# Patient Record
Sex: Female | Born: 1945
Health system: Southern US, Community
[De-identification: ages and names within clinical notes are randomized; demographics above are authoritative.]

## PROBLEM LIST (undated history)

## (undated) DIAGNOSIS — I10 Essential (primary) hypertension: Secondary | ICD-10-CM

## (undated) DIAGNOSIS — M5136 Other intervertebral disc degeneration, lumbar region: Secondary | ICD-10-CM

## (undated) DIAGNOSIS — J45909 Unspecified asthma, uncomplicated: Secondary | ICD-10-CM

## (undated) DIAGNOSIS — T8859XA Other complications of anesthesia, initial encounter: Secondary | ICD-10-CM

## (undated) DIAGNOSIS — M51369 Other intervertebral disc degeneration, lumbar region without mention of lumbar back pain or lower extremity pain: Secondary | ICD-10-CM

## (undated) DIAGNOSIS — K759 Inflammatory liver disease, unspecified: Secondary | ICD-10-CM

## (undated) DIAGNOSIS — E079 Disorder of thyroid, unspecified: Secondary | ICD-10-CM

## (undated) DIAGNOSIS — T4145XA Adverse effect of unspecified anesthetic, initial encounter: Secondary | ICD-10-CM

## (undated) DIAGNOSIS — R011 Cardiac murmur, unspecified: Secondary | ICD-10-CM

## (undated) DIAGNOSIS — M5135 Other intervertebral disc degeneration, thoracolumbar region: Secondary | ICD-10-CM

## (undated) DIAGNOSIS — K219 Gastro-esophageal reflux disease without esophagitis: Secondary | ICD-10-CM

## (undated) DIAGNOSIS — K5790 Diverticulosis of intestine, part unspecified, without perforation or abscess without bleeding: Secondary | ICD-10-CM

## (undated) DIAGNOSIS — Z9889 Other specified postprocedural states: Secondary | ICD-10-CM

## (undated) DIAGNOSIS — E039 Hypothyroidism, unspecified: Secondary | ICD-10-CM

## (undated) DIAGNOSIS — M5126 Other intervertebral disc displacement, lumbar region: Secondary | ICD-10-CM

## (undated) DIAGNOSIS — Z8489 Family history of other specified conditions: Secondary | ICD-10-CM

## (undated) DIAGNOSIS — N3010 Interstitial cystitis (chronic) without hematuria: Secondary | ICD-10-CM

## (undated) DIAGNOSIS — M797 Fibromyalgia: Secondary | ICD-10-CM

## (undated) DIAGNOSIS — M1990 Unspecified osteoarthritis, unspecified site: Secondary | ICD-10-CM

## (undated) DIAGNOSIS — C801 Malignant (primary) neoplasm, unspecified: Secondary | ICD-10-CM

## (undated) DIAGNOSIS — N289 Disorder of kidney and ureter, unspecified: Secondary | ICD-10-CM

## (undated) DIAGNOSIS — R112 Nausea with vomiting, unspecified: Secondary | ICD-10-CM

## (undated) HISTORY — DX: Other intervertebral disc displacement, lumbar region: M51.26

## (undated) HISTORY — DX: Cardiac murmur, unspecified: R01.1

## (undated) HISTORY — PX: COLON SURGERY: SHX602

## (undated) HISTORY — DX: Unspecified osteoarthritis, unspecified site: M19.90

## (undated) HISTORY — DX: Unspecified asthma, uncomplicated: J45.909

## (undated) HISTORY — DX: Malignant (primary) neoplasm, unspecified: C80.1

## (undated) HISTORY — PX: COLONOSCOPY: SHX174

## (undated) HISTORY — DX: Other intervertebral disc degeneration, lumbar region: M51.36

## (undated) HISTORY — DX: Disorder of thyroid, unspecified: E07.9

## (undated) HISTORY — PX: BREAST SURGERY: SHX581

## (undated) HISTORY — DX: Disorder of kidney and ureter, unspecified: N28.9

## (undated) HISTORY — DX: Diverticulosis of intestine, part unspecified, without perforation or abscess without bleeding: K57.90

## (undated) HISTORY — DX: Other intervertebral disc degeneration, lumbar region without mention of lumbar back pain or lower extremity pain: M51.369

## (undated) HISTORY — PX: TONSILLECTOMY: SUR1361

## (undated) HISTORY — DX: Interstitial cystitis (chronic) without hematuria: N30.10

---
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
## 1973-07-25 HISTORY — PX: HEMORRHOID SURGERY: SHX153

## 1973-07-25 HISTORY — PX: EXCISIONAL HEMORRHOIDECTOMY: SHX1541

## 1973-07-25 HISTORY — PX: DILATION AND CURETTAGE OF UTERUS: SHX78

## 1982-07-25 HISTORY — PX: BLADDER SURGERY: SHX569

## 1997-07-25 DIAGNOSIS — C801 Malignant (primary) neoplasm, unspecified: Secondary | ICD-10-CM

## 1997-07-25 HISTORY — PX: THYROIDECTOMY: SHX17

## 1997-07-25 HISTORY — DX: Malignant (primary) neoplasm, unspecified: C80.1

## 2004-06-28 ENCOUNTER — Ambulatory Visit: Payer: Self-pay | Admitting: General Surgery

## 2008-10-27 ENCOUNTER — Ambulatory Visit: Payer: Self-pay | Admitting: Internal Medicine

## 2009-07-25 HISTORY — PX: EYE SURGERY: SHX253

## 2009-07-25 HISTORY — PX: CATARACT EXTRACTION: SUR2

## 2010-08-03 ENCOUNTER — Ambulatory Visit: Payer: Self-pay | Admitting: Ophthalmology

## 2010-08-10 ENCOUNTER — Ambulatory Visit: Payer: Self-pay | Admitting: Ophthalmology

## 2010-11-29 ENCOUNTER — Other Ambulatory Visit: Payer: Self-pay | Admitting: Internal Medicine

## 2011-07-28 DIAGNOSIS — J309 Allergic rhinitis, unspecified: Secondary | ICD-10-CM | POA: Diagnosis not present

## 2011-08-02 DIAGNOSIS — K219 Gastro-esophageal reflux disease without esophagitis: Secondary | ICD-10-CM | POA: Diagnosis not present

## 2011-08-02 DIAGNOSIS — E038 Other specified hypothyroidism: Secondary | ICD-10-CM | POA: Diagnosis not present

## 2011-08-02 DIAGNOSIS — J309 Allergic rhinitis, unspecified: Secondary | ICD-10-CM | POA: Diagnosis not present

## 2011-08-02 DIAGNOSIS — J449 Chronic obstructive pulmonary disease, unspecified: Secondary | ICD-10-CM | POA: Diagnosis not present

## 2011-08-02 DIAGNOSIS — M353 Polymyalgia rheumatica: Secondary | ICD-10-CM | POA: Diagnosis not present

## 2011-08-11 DIAGNOSIS — J309 Allergic rhinitis, unspecified: Secondary | ICD-10-CM | POA: Diagnosis not present

## 2011-08-16 DIAGNOSIS — J309 Allergic rhinitis, unspecified: Secondary | ICD-10-CM | POA: Diagnosis not present

## 2011-08-17 DIAGNOSIS — M2550 Pain in unspecified joint: Secondary | ICD-10-CM | POA: Diagnosis not present

## 2011-08-17 DIAGNOSIS — IMO0001 Reserved for inherently not codable concepts without codable children: Secondary | ICD-10-CM | POA: Diagnosis not present

## 2011-08-17 DIAGNOSIS — M65839 Other synovitis and tenosynovitis, unspecified forearm: Secondary | ICD-10-CM | POA: Diagnosis not present

## 2011-08-17 DIAGNOSIS — M65849 Other synovitis and tenosynovitis, unspecified hand: Secondary | ICD-10-CM | POA: Diagnosis not present

## 2011-08-17 DIAGNOSIS — M1990 Unspecified osteoarthritis, unspecified site: Secondary | ICD-10-CM | POA: Diagnosis not present

## 2011-08-23 DIAGNOSIS — J309 Allergic rhinitis, unspecified: Secondary | ICD-10-CM | POA: Diagnosis not present

## 2011-08-26 DIAGNOSIS — J309 Allergic rhinitis, unspecified: Secondary | ICD-10-CM | POA: Diagnosis not present

## 2011-08-30 DIAGNOSIS — J309 Allergic rhinitis, unspecified: Secondary | ICD-10-CM | POA: Diagnosis not present

## 2011-09-06 DIAGNOSIS — J309 Allergic rhinitis, unspecified: Secondary | ICD-10-CM | POA: Diagnosis not present

## 2011-09-07 DIAGNOSIS — M2550 Pain in unspecified joint: Secondary | ICD-10-CM | POA: Diagnosis not present

## 2011-09-07 DIAGNOSIS — IMO0002 Reserved for concepts with insufficient information to code with codable children: Secondary | ICD-10-CM | POA: Diagnosis not present

## 2011-09-07 DIAGNOSIS — IMO0001 Reserved for inherently not codable concepts without codable children: Secondary | ICD-10-CM | POA: Diagnosis not present

## 2011-09-07 DIAGNOSIS — M810 Age-related osteoporosis without current pathological fracture: Secondary | ICD-10-CM | POA: Diagnosis not present

## 2011-09-13 DIAGNOSIS — J309 Allergic rhinitis, unspecified: Secondary | ICD-10-CM | POA: Diagnosis not present

## 2011-09-23 DIAGNOSIS — J398 Other specified diseases of upper respiratory tract: Secondary | ICD-10-CM | POA: Diagnosis not present

## 2011-09-29 DIAGNOSIS — J309 Allergic rhinitis, unspecified: Secondary | ICD-10-CM | POA: Diagnosis not present

## 2011-10-11 DIAGNOSIS — J309 Allergic rhinitis, unspecified: Secondary | ICD-10-CM | POA: Diagnosis not present

## 2011-10-11 DIAGNOSIS — T887XXA Unspecified adverse effect of drug or medicament, initial encounter: Secondary | ICD-10-CM | POA: Diagnosis not present

## 2011-10-18 DIAGNOSIS — J309 Allergic rhinitis, unspecified: Secondary | ICD-10-CM | POA: Diagnosis not present

## 2011-10-25 DIAGNOSIS — J309 Allergic rhinitis, unspecified: Secondary | ICD-10-CM | POA: Diagnosis not present

## 2011-11-03 DIAGNOSIS — J309 Allergic rhinitis, unspecified: Secondary | ICD-10-CM | POA: Diagnosis not present

## 2011-11-04 DIAGNOSIS — E039 Hypothyroidism, unspecified: Secondary | ICD-10-CM | POA: Diagnosis not present

## 2011-11-04 DIAGNOSIS — E7800 Pure hypercholesterolemia, unspecified: Secondary | ICD-10-CM | POA: Diagnosis not present

## 2011-11-08 DIAGNOSIS — J309 Allergic rhinitis, unspecified: Secondary | ICD-10-CM | POA: Diagnosis not present

## 2011-11-22 DIAGNOSIS — J309 Allergic rhinitis, unspecified: Secondary | ICD-10-CM | POA: Diagnosis not present

## 2011-11-29 DIAGNOSIS — M353 Polymyalgia rheumatica: Secondary | ICD-10-CM | POA: Diagnosis not present

## 2011-11-29 DIAGNOSIS — J311 Chronic nasopharyngitis: Secondary | ICD-10-CM | POA: Diagnosis not present

## 2011-11-29 DIAGNOSIS — J309 Allergic rhinitis, unspecified: Secondary | ICD-10-CM | POA: Diagnosis not present

## 2011-11-29 DIAGNOSIS — J449 Chronic obstructive pulmonary disease, unspecified: Secondary | ICD-10-CM | POA: Diagnosis not present

## 2011-11-29 DIAGNOSIS — K219 Gastro-esophageal reflux disease without esophagitis: Secondary | ICD-10-CM | POA: Diagnosis not present

## 2011-11-29 DIAGNOSIS — E038 Other specified hypothyroidism: Secondary | ICD-10-CM | POA: Diagnosis not present

## 2011-12-08 DIAGNOSIS — J309 Allergic rhinitis, unspecified: Secondary | ICD-10-CM | POA: Diagnosis not present

## 2011-12-21 DIAGNOSIS — M1990 Unspecified osteoarthritis, unspecified site: Secondary | ICD-10-CM | POA: Diagnosis not present

## 2011-12-21 DIAGNOSIS — IMO0001 Reserved for inherently not codable concepts without codable children: Secondary | ICD-10-CM | POA: Diagnosis not present

## 2011-12-21 DIAGNOSIS — IMO0002 Reserved for concepts with insufficient information to code with codable children: Secondary | ICD-10-CM | POA: Diagnosis not present

## 2011-12-21 DIAGNOSIS — M2550 Pain in unspecified joint: Secondary | ICD-10-CM | POA: Diagnosis not present

## 2011-12-22 DIAGNOSIS — J309 Allergic rhinitis, unspecified: Secondary | ICD-10-CM | POA: Diagnosis not present

## 2011-12-27 DIAGNOSIS — J309 Allergic rhinitis, unspecified: Secondary | ICD-10-CM | POA: Diagnosis not present

## 2012-01-03 DIAGNOSIS — J309 Allergic rhinitis, unspecified: Secondary | ICD-10-CM | POA: Diagnosis not present

## 2012-01-03 DIAGNOSIS — J45909 Unspecified asthma, uncomplicated: Secondary | ICD-10-CM | POA: Diagnosis not present

## 2012-01-03 DIAGNOSIS — J3089 Other allergic rhinitis: Secondary | ICD-10-CM | POA: Diagnosis not present

## 2012-01-03 DIAGNOSIS — J301 Allergic rhinitis due to pollen: Secondary | ICD-10-CM | POA: Diagnosis not present

## 2012-01-12 DIAGNOSIS — J309 Allergic rhinitis, unspecified: Secondary | ICD-10-CM | POA: Diagnosis not present

## 2012-01-17 DIAGNOSIS — J309 Allergic rhinitis, unspecified: Secondary | ICD-10-CM | POA: Diagnosis not present

## 2012-01-19 DIAGNOSIS — M2550 Pain in unspecified joint: Secondary | ICD-10-CM | POA: Diagnosis not present

## 2012-01-19 DIAGNOSIS — IMO0002 Reserved for concepts with insufficient information to code with codable children: Secondary | ICD-10-CM | POA: Diagnosis not present

## 2012-01-19 DIAGNOSIS — M1990 Unspecified osteoarthritis, unspecified site: Secondary | ICD-10-CM | POA: Diagnosis not present

## 2012-01-19 DIAGNOSIS — IMO0001 Reserved for inherently not codable concepts without codable children: Secondary | ICD-10-CM | POA: Diagnosis not present

## 2012-01-31 DIAGNOSIS — J309 Allergic rhinitis, unspecified: Secondary | ICD-10-CM | POA: Diagnosis not present

## 2012-02-07 DIAGNOSIS — J309 Allergic rhinitis, unspecified: Secondary | ICD-10-CM | POA: Diagnosis not present

## 2012-02-14 DIAGNOSIS — J309 Allergic rhinitis, unspecified: Secondary | ICD-10-CM | POA: Diagnosis not present

## 2012-02-15 DIAGNOSIS — Z1231 Encounter for screening mammogram for malignant neoplasm of breast: Secondary | ICD-10-CM | POA: Diagnosis not present

## 2012-02-21 DIAGNOSIS — J309 Allergic rhinitis, unspecified: Secondary | ICD-10-CM | POA: Diagnosis not present

## 2012-02-28 DIAGNOSIS — J309 Allergic rhinitis, unspecified: Secondary | ICD-10-CM | POA: Diagnosis not present

## 2012-03-13 DIAGNOSIS — R232 Flushing: Secondary | ICD-10-CM | POA: Diagnosis not present

## 2012-03-13 DIAGNOSIS — J309 Allergic rhinitis, unspecified: Secondary | ICD-10-CM | POA: Diagnosis not present

## 2012-03-20 DIAGNOSIS — J309 Allergic rhinitis, unspecified: Secondary | ICD-10-CM | POA: Diagnosis not present

## 2012-03-27 DIAGNOSIS — J309 Allergic rhinitis, unspecified: Secondary | ICD-10-CM | POA: Diagnosis not present

## 2012-04-03 DIAGNOSIS — J309 Allergic rhinitis, unspecified: Secondary | ICD-10-CM | POA: Diagnosis not present

## 2012-04-10 DIAGNOSIS — J309 Allergic rhinitis, unspecified: Secondary | ICD-10-CM | POA: Diagnosis not present

## 2012-04-17 DIAGNOSIS — M064 Inflammatory polyarthropathy: Secondary | ICD-10-CM | POA: Diagnosis not present

## 2012-04-17 DIAGNOSIS — M545 Low back pain, unspecified: Secondary | ICD-10-CM | POA: Diagnosis not present

## 2012-04-17 DIAGNOSIS — J309 Allergic rhinitis, unspecified: Secondary | ICD-10-CM | POA: Diagnosis not present

## 2012-04-17 DIAGNOSIS — M25519 Pain in unspecified shoulder: Secondary | ICD-10-CM | POA: Diagnosis not present

## 2012-04-17 DIAGNOSIS — M542 Cervicalgia: Secondary | ICD-10-CM | POA: Diagnosis not present

## 2012-04-17 DIAGNOSIS — M25549 Pain in joints of unspecified hand: Secondary | ICD-10-CM | POA: Diagnosis not present

## 2012-04-24 DIAGNOSIS — Z23 Encounter for immunization: Secondary | ICD-10-CM | POA: Diagnosis not present

## 2012-04-24 DIAGNOSIS — J309 Allergic rhinitis, unspecified: Secondary | ICD-10-CM | POA: Diagnosis not present

## 2012-05-01 DIAGNOSIS — J309 Allergic rhinitis, unspecified: Secondary | ICD-10-CM | POA: Diagnosis not present

## 2012-05-16 DIAGNOSIS — M47812 Spondylosis without myelopathy or radiculopathy, cervical region: Secondary | ICD-10-CM | POA: Diagnosis not present

## 2012-05-16 DIAGNOSIS — M19049 Primary osteoarthritis, unspecified hand: Secondary | ICD-10-CM | POA: Diagnosis not present

## 2012-05-17 DIAGNOSIS — J309 Allergic rhinitis, unspecified: Secondary | ICD-10-CM | POA: Diagnosis not present

## 2012-05-22 DIAGNOSIS — J309 Allergic rhinitis, unspecified: Secondary | ICD-10-CM | POA: Diagnosis not present

## 2012-05-29 DIAGNOSIS — J309 Allergic rhinitis, unspecified: Secondary | ICD-10-CM | POA: Diagnosis not present

## 2012-06-07 ENCOUNTER — Encounter: Payer: Self-pay | Admitting: Rheumatology

## 2012-06-07 DIAGNOSIS — M6281 Muscle weakness (generalized): Secondary | ICD-10-CM | POA: Diagnosis not present

## 2012-06-07 DIAGNOSIS — M542 Cervicalgia: Secondary | ICD-10-CM | POA: Diagnosis not present

## 2012-06-07 DIAGNOSIS — IMO0001 Reserved for inherently not codable concepts without codable children: Secondary | ICD-10-CM | POA: Diagnosis not present

## 2012-06-07 DIAGNOSIS — J309 Allergic rhinitis, unspecified: Secondary | ICD-10-CM | POA: Diagnosis not present

## 2012-06-12 DIAGNOSIS — E038 Other specified hypothyroidism: Secondary | ICD-10-CM | POA: Diagnosis not present

## 2012-06-12 DIAGNOSIS — M353 Polymyalgia rheumatica: Secondary | ICD-10-CM | POA: Diagnosis not present

## 2012-06-12 DIAGNOSIS — J309 Allergic rhinitis, unspecified: Secondary | ICD-10-CM | POA: Diagnosis not present

## 2012-06-12 DIAGNOSIS — K219 Gastro-esophageal reflux disease without esophagitis: Secondary | ICD-10-CM | POA: Diagnosis not present

## 2012-06-12 DIAGNOSIS — N3010 Interstitial cystitis (chronic) without hematuria: Secondary | ICD-10-CM | POA: Diagnosis not present

## 2012-06-24 ENCOUNTER — Encounter: Payer: Self-pay | Admitting: Rheumatology

## 2012-06-24 DIAGNOSIS — M542 Cervicalgia: Secondary | ICD-10-CM | POA: Diagnosis not present

## 2012-06-24 DIAGNOSIS — M2560 Stiffness of unspecified joint, not elsewhere classified: Secondary | ICD-10-CM | POA: Diagnosis not present

## 2012-06-24 DIAGNOSIS — M6281 Muscle weakness (generalized): Secondary | ICD-10-CM | POA: Diagnosis not present

## 2012-06-24 DIAGNOSIS — IMO0001 Reserved for inherently not codable concepts without codable children: Secondary | ICD-10-CM | POA: Diagnosis not present

## 2012-06-26 DIAGNOSIS — M353 Polymyalgia rheumatica: Secondary | ICD-10-CM | POA: Diagnosis not present

## 2012-06-26 DIAGNOSIS — N390 Urinary tract infection, site not specified: Secondary | ICD-10-CM | POA: Diagnosis not present

## 2012-06-26 DIAGNOSIS — K219 Gastro-esophageal reflux disease without esophagitis: Secondary | ICD-10-CM | POA: Diagnosis not present

## 2012-06-26 DIAGNOSIS — J311 Chronic nasopharyngitis: Secondary | ICD-10-CM | POA: Diagnosis not present

## 2012-07-03 DIAGNOSIS — J309 Allergic rhinitis, unspecified: Secondary | ICD-10-CM | POA: Diagnosis not present

## 2012-07-06 DIAGNOSIS — M25549 Pain in joints of unspecified hand: Secondary | ICD-10-CM | POA: Diagnosis not present

## 2012-07-06 DIAGNOSIS — M064 Inflammatory polyarthropathy: Secondary | ICD-10-CM | POA: Diagnosis not present

## 2012-07-10 DIAGNOSIS — J309 Allergic rhinitis, unspecified: Secondary | ICD-10-CM | POA: Diagnosis not present

## 2012-07-19 DIAGNOSIS — J309 Allergic rhinitis, unspecified: Secondary | ICD-10-CM | POA: Diagnosis not present

## 2012-07-23 DIAGNOSIS — J309 Allergic rhinitis, unspecified: Secondary | ICD-10-CM | POA: Diagnosis not present

## 2012-07-25 ENCOUNTER — Encounter: Payer: Self-pay | Admitting: Rheumatology

## 2012-07-25 DIAGNOSIS — M6281 Muscle weakness (generalized): Secondary | ICD-10-CM | POA: Diagnosis not present

## 2012-07-25 DIAGNOSIS — M542 Cervicalgia: Secondary | ICD-10-CM | POA: Diagnosis not present

## 2012-07-25 DIAGNOSIS — M2560 Stiffness of unspecified joint, not elsewhere classified: Secondary | ICD-10-CM | POA: Diagnosis not present

## 2012-07-25 DIAGNOSIS — IMO0001 Reserved for inherently not codable concepts without codable children: Secondary | ICD-10-CM | POA: Diagnosis not present

## 2012-07-30 DIAGNOSIS — J449 Chronic obstructive pulmonary disease, unspecified: Secondary | ICD-10-CM | POA: Diagnosis not present

## 2012-07-30 DIAGNOSIS — J4489 Other specified chronic obstructive pulmonary disease: Secondary | ICD-10-CM | POA: Diagnosis not present

## 2012-07-30 DIAGNOSIS — M1990 Unspecified osteoarthritis, unspecified site: Secondary | ICD-10-CM | POA: Diagnosis not present

## 2012-07-30 DIAGNOSIS — L52 Erythema nodosum: Secondary | ICD-10-CM | POA: Diagnosis not present

## 2012-07-30 DIAGNOSIS — E038 Other specified hypothyroidism: Secondary | ICD-10-CM | POA: Diagnosis not present

## 2012-07-30 DIAGNOSIS — T887XXA Unspecified adverse effect of drug or medicament, initial encounter: Secondary | ICD-10-CM | POA: Diagnosis not present

## 2012-07-31 DIAGNOSIS — J309 Allergic rhinitis, unspecified: Secondary | ICD-10-CM | POA: Diagnosis not present

## 2012-08-07 DIAGNOSIS — J309 Allergic rhinitis, unspecified: Secondary | ICD-10-CM | POA: Diagnosis not present

## 2012-08-14 DIAGNOSIS — J309 Allergic rhinitis, unspecified: Secondary | ICD-10-CM | POA: Diagnosis not present

## 2012-08-25 ENCOUNTER — Encounter: Payer: Self-pay | Admitting: Rheumatology

## 2012-08-28 DIAGNOSIS — J309 Allergic rhinitis, unspecified: Secondary | ICD-10-CM | POA: Diagnosis not present

## 2012-09-04 DIAGNOSIS — J309 Allergic rhinitis, unspecified: Secondary | ICD-10-CM | POA: Diagnosis not present

## 2012-09-11 DIAGNOSIS — G4700 Insomnia, unspecified: Secondary | ICD-10-CM | POA: Diagnosis not present

## 2012-09-11 DIAGNOSIS — J309 Allergic rhinitis, unspecified: Secondary | ICD-10-CM | POA: Diagnosis not present

## 2012-09-11 DIAGNOSIS — H04129 Dry eye syndrome of unspecified lacrimal gland: Secondary | ICD-10-CM | POA: Diagnosis not present

## 2012-09-11 DIAGNOSIS — IMO0002 Reserved for concepts with insufficient information to code with codable children: Secondary | ICD-10-CM | POA: Diagnosis not present

## 2012-09-18 DIAGNOSIS — J309 Allergic rhinitis, unspecified: Secondary | ICD-10-CM | POA: Diagnosis not present

## 2012-09-27 DIAGNOSIS — J309 Allergic rhinitis, unspecified: Secondary | ICD-10-CM | POA: Diagnosis not present

## 2012-10-02 DIAGNOSIS — J309 Allergic rhinitis, unspecified: Secondary | ICD-10-CM | POA: Diagnosis not present

## 2012-10-03 DIAGNOSIS — M545 Low back pain, unspecified: Secondary | ICD-10-CM | POA: Diagnosis not present

## 2012-10-03 DIAGNOSIS — I651 Occlusion and stenosis of basilar artery: Secondary | ICD-10-CM | POA: Diagnosis not present

## 2012-10-03 DIAGNOSIS — I7300 Raynaud's syndrome without gangrene: Secondary | ICD-10-CM | POA: Diagnosis not present

## 2012-10-09 DIAGNOSIS — J309 Allergic rhinitis, unspecified: Secondary | ICD-10-CM | POA: Diagnosis not present

## 2012-10-16 DIAGNOSIS — J309 Allergic rhinitis, unspecified: Secondary | ICD-10-CM | POA: Diagnosis not present

## 2012-10-17 DIAGNOSIS — E038 Other specified hypothyroidism: Secondary | ICD-10-CM | POA: Diagnosis not present

## 2012-10-17 DIAGNOSIS — I10 Essential (primary) hypertension: Secondary | ICD-10-CM | POA: Diagnosis not present

## 2012-10-17 DIAGNOSIS — N181 Chronic kidney disease, stage 1: Secondary | ICD-10-CM | POA: Diagnosis not present

## 2012-10-17 DIAGNOSIS — M353 Polymyalgia rheumatica: Secondary | ICD-10-CM | POA: Diagnosis not present

## 2012-10-23 DIAGNOSIS — J3089 Other allergic rhinitis: Secondary | ICD-10-CM | POA: Diagnosis not present

## 2012-10-23 DIAGNOSIS — J301 Allergic rhinitis due to pollen: Secondary | ICD-10-CM | POA: Diagnosis not present

## 2012-10-23 DIAGNOSIS — J45909 Unspecified asthma, uncomplicated: Secondary | ICD-10-CM | POA: Diagnosis not present

## 2012-10-23 DIAGNOSIS — H1045 Other chronic allergic conjunctivitis: Secondary | ICD-10-CM | POA: Diagnosis not present

## 2012-10-25 DIAGNOSIS — J309 Allergic rhinitis, unspecified: Secondary | ICD-10-CM | POA: Diagnosis not present

## 2012-10-30 DIAGNOSIS — J309 Allergic rhinitis, unspecified: Secondary | ICD-10-CM | POA: Diagnosis not present

## 2012-11-01 DIAGNOSIS — M5412 Radiculopathy, cervical region: Secondary | ICD-10-CM | POA: Diagnosis not present

## 2012-11-01 DIAGNOSIS — M5030 Other cervical disc degeneration, unspecified cervical region: Secondary | ICD-10-CM | POA: Diagnosis not present

## 2012-11-01 DIAGNOSIS — M25519 Pain in unspecified shoulder: Secondary | ICD-10-CM | POA: Diagnosis not present

## 2012-11-01 DIAGNOSIS — M47812 Spondylosis without myelopathy or radiculopathy, cervical region: Secondary | ICD-10-CM | POA: Diagnosis not present

## 2012-11-06 DIAGNOSIS — J309 Allergic rhinitis, unspecified: Secondary | ICD-10-CM | POA: Diagnosis not present

## 2012-11-09 DIAGNOSIS — M5126 Other intervertebral disc displacement, lumbar region: Secondary | ICD-10-CM | POA: Diagnosis not present

## 2012-11-09 DIAGNOSIS — M545 Low back pain, unspecified: Secondary | ICD-10-CM | POA: Diagnosis not present

## 2012-11-09 DIAGNOSIS — M4310 Spondylolisthesis, site unspecified: Secondary | ICD-10-CM | POA: Diagnosis not present

## 2012-11-09 DIAGNOSIS — M47817 Spondylosis without myelopathy or radiculopathy, lumbosacral region: Secondary | ICD-10-CM | POA: Diagnosis not present

## 2012-11-13 DIAGNOSIS — J309 Allergic rhinitis, unspecified: Secondary | ICD-10-CM | POA: Diagnosis not present

## 2012-11-13 DIAGNOSIS — M5137 Other intervertebral disc degeneration, lumbosacral region: Secondary | ICD-10-CM | POA: Diagnosis not present

## 2012-11-13 DIAGNOSIS — IMO0002 Reserved for concepts with insufficient information to code with codable children: Secondary | ICD-10-CM | POA: Diagnosis not present

## 2012-11-13 DIAGNOSIS — M47817 Spondylosis without myelopathy or radiculopathy, lumbosacral region: Secondary | ICD-10-CM | POA: Diagnosis not present

## 2012-11-20 DIAGNOSIS — J309 Allergic rhinitis, unspecified: Secondary | ICD-10-CM | POA: Diagnosis not present

## 2012-11-21 DIAGNOSIS — M5137 Other intervertebral disc degeneration, lumbosacral region: Secondary | ICD-10-CM | POA: Diagnosis not present

## 2012-11-21 DIAGNOSIS — IMO0002 Reserved for concepts with insufficient information to code with codable children: Secondary | ICD-10-CM | POA: Diagnosis not present

## 2012-11-27 DIAGNOSIS — N3010 Interstitial cystitis (chronic) without hematuria: Secondary | ICD-10-CM | POA: Diagnosis not present

## 2012-11-27 DIAGNOSIS — M545 Low back pain, unspecified: Secondary | ICD-10-CM | POA: Diagnosis not present

## 2012-11-27 DIAGNOSIS — E038 Other specified hypothyroidism: Secondary | ICD-10-CM | POA: Diagnosis not present

## 2012-11-27 DIAGNOSIS — I10 Essential (primary) hypertension: Secondary | ICD-10-CM | POA: Diagnosis not present

## 2012-12-04 DIAGNOSIS — J309 Allergic rhinitis, unspecified: Secondary | ICD-10-CM | POA: Diagnosis not present

## 2012-12-07 DIAGNOSIS — M545 Low back pain, unspecified: Secondary | ICD-10-CM | POA: Diagnosis not present

## 2012-12-07 DIAGNOSIS — M25549 Pain in joints of unspecified hand: Secondary | ICD-10-CM | POA: Diagnosis not present

## 2012-12-07 DIAGNOSIS — M159 Polyosteoarthritis, unspecified: Secondary | ICD-10-CM | POA: Diagnosis not present

## 2012-12-11 DIAGNOSIS — J309 Allergic rhinitis, unspecified: Secondary | ICD-10-CM | POA: Diagnosis not present

## 2012-12-12 DIAGNOSIS — N951 Menopausal and female climacteric states: Secondary | ICD-10-CM | POA: Diagnosis not present

## 2012-12-18 DIAGNOSIS — IMO0002 Reserved for concepts with insufficient information to code with codable children: Secondary | ICD-10-CM | POA: Diagnosis not present

## 2012-12-18 DIAGNOSIS — M5137 Other intervertebral disc degeneration, lumbosacral region: Secondary | ICD-10-CM | POA: Diagnosis not present

## 2012-12-20 DIAGNOSIS — J309 Allergic rhinitis, unspecified: Secondary | ICD-10-CM | POA: Diagnosis not present

## 2012-12-25 DIAGNOSIS — J309 Allergic rhinitis, unspecified: Secondary | ICD-10-CM | POA: Diagnosis not present

## 2012-12-26 DIAGNOSIS — M5137 Other intervertebral disc degeneration, lumbosacral region: Secondary | ICD-10-CM | POA: Diagnosis not present

## 2012-12-26 DIAGNOSIS — IMO0002 Reserved for concepts with insufficient information to code with codable children: Secondary | ICD-10-CM | POA: Diagnosis not present

## 2013-01-03 DIAGNOSIS — J019 Acute sinusitis, unspecified: Secondary | ICD-10-CM | POA: Diagnosis not present

## 2013-01-03 DIAGNOSIS — J309 Allergic rhinitis, unspecified: Secondary | ICD-10-CM | POA: Diagnosis not present

## 2013-01-15 DIAGNOSIS — M5137 Other intervertebral disc degeneration, lumbosacral region: Secondary | ICD-10-CM | POA: Diagnosis not present

## 2013-01-15 DIAGNOSIS — M5030 Other cervical disc degeneration, unspecified cervical region: Secondary | ICD-10-CM | POA: Diagnosis not present

## 2013-01-15 DIAGNOSIS — J309 Allergic rhinitis, unspecified: Secondary | ICD-10-CM | POA: Diagnosis not present

## 2013-01-15 DIAGNOSIS — IMO0002 Reserved for concepts with insufficient information to code with codable children: Secondary | ICD-10-CM | POA: Diagnosis not present

## 2013-01-29 ENCOUNTER — Ambulatory Visit: Payer: Self-pay | Admitting: Internal Medicine

## 2013-01-29 DIAGNOSIS — E878 Other disorders of electrolyte and fluid balance, not elsewhere classified: Secondary | ICD-10-CM | POA: Diagnosis not present

## 2013-01-29 DIAGNOSIS — E7800 Pure hypercholesterolemia, unspecified: Secondary | ICD-10-CM | POA: Diagnosis not present

## 2013-01-29 DIAGNOSIS — E039 Hypothyroidism, unspecified: Secondary | ICD-10-CM | POA: Diagnosis not present

## 2013-01-29 DIAGNOSIS — J309 Allergic rhinitis, unspecified: Secondary | ICD-10-CM | POA: Diagnosis not present

## 2013-01-29 DIAGNOSIS — R918 Other nonspecific abnormal finding of lung field: Secondary | ICD-10-CM | POA: Diagnosis not present

## 2013-01-29 DIAGNOSIS — E559 Vitamin D deficiency, unspecified: Secondary | ICD-10-CM | POA: Diagnosis not present

## 2013-01-29 DIAGNOSIS — E785 Hyperlipidemia, unspecified: Secondary | ICD-10-CM | POA: Diagnosis not present

## 2013-01-29 DIAGNOSIS — IMO0001 Reserved for inherently not codable concepts without codable children: Secondary | ICD-10-CM | POA: Diagnosis not present

## 2013-01-29 DIAGNOSIS — M069 Rheumatoid arthritis, unspecified: Secondary | ICD-10-CM | POA: Diagnosis not present

## 2013-01-29 DIAGNOSIS — Z85850 Personal history of malignant neoplasm of thyroid: Secondary | ICD-10-CM | POA: Diagnosis not present

## 2013-01-29 DIAGNOSIS — D649 Anemia, unspecified: Secondary | ICD-10-CM | POA: Diagnosis not present

## 2013-02-05 DIAGNOSIS — J0100 Acute maxillary sinusitis, unspecified: Secondary | ICD-10-CM | POA: Diagnosis not present

## 2013-02-05 DIAGNOSIS — J311 Chronic nasopharyngitis: Secondary | ICD-10-CM | POA: Diagnosis not present

## 2013-02-05 DIAGNOSIS — E038 Other specified hypothyroidism: Secondary | ICD-10-CM | POA: Diagnosis not present

## 2013-02-05 DIAGNOSIS — J309 Allergic rhinitis, unspecified: Secondary | ICD-10-CM | POA: Diagnosis not present

## 2013-02-12 DIAGNOSIS — J309 Allergic rhinitis, unspecified: Secondary | ICD-10-CM | POA: Diagnosis not present

## 2013-02-19 DIAGNOSIS — Z1231 Encounter for screening mammogram for malignant neoplasm of breast: Secondary | ICD-10-CM | POA: Diagnosis not present

## 2013-02-19 DIAGNOSIS — J309 Allergic rhinitis, unspecified: Secondary | ICD-10-CM | POA: Diagnosis not present

## 2013-02-26 DIAGNOSIS — J309 Allergic rhinitis, unspecified: Secondary | ICD-10-CM | POA: Diagnosis not present

## 2013-03-12 DIAGNOSIS — J309 Allergic rhinitis, unspecified: Secondary | ICD-10-CM | POA: Diagnosis not present

## 2013-03-19 DIAGNOSIS — J309 Allergic rhinitis, unspecified: Secondary | ICD-10-CM | POA: Diagnosis not present

## 2013-03-26 DIAGNOSIS — J309 Allergic rhinitis, unspecified: Secondary | ICD-10-CM | POA: Diagnosis not present

## 2013-03-29 DIAGNOSIS — J309 Allergic rhinitis, unspecified: Secondary | ICD-10-CM | POA: Diagnosis not present

## 2013-04-02 DIAGNOSIS — J309 Allergic rhinitis, unspecified: Secondary | ICD-10-CM | POA: Diagnosis not present

## 2013-04-18 DIAGNOSIS — J309 Allergic rhinitis, unspecified: Secondary | ICD-10-CM | POA: Diagnosis not present

## 2013-04-23 DIAGNOSIS — J309 Allergic rhinitis, unspecified: Secondary | ICD-10-CM | POA: Diagnosis not present

## 2013-04-23 DIAGNOSIS — J45909 Unspecified asthma, uncomplicated: Secondary | ICD-10-CM | POA: Diagnosis not present

## 2013-04-23 DIAGNOSIS — J301 Allergic rhinitis due to pollen: Secondary | ICD-10-CM | POA: Diagnosis not present

## 2013-04-23 DIAGNOSIS — J209 Acute bronchitis, unspecified: Secondary | ICD-10-CM | POA: Diagnosis not present

## 2013-04-23 DIAGNOSIS — H1045 Other chronic allergic conjunctivitis: Secondary | ICD-10-CM | POA: Diagnosis not present

## 2013-04-23 DIAGNOSIS — J3089 Other allergic rhinitis: Secondary | ICD-10-CM | POA: Diagnosis not present

## 2013-04-29 DIAGNOSIS — N3010 Interstitial cystitis (chronic) without hematuria: Secondary | ICD-10-CM | POA: Diagnosis not present

## 2013-04-29 DIAGNOSIS — I10 Essential (primary) hypertension: Secondary | ICD-10-CM | POA: Diagnosis not present

## 2013-04-29 DIAGNOSIS — J018 Other acute sinusitis: Secondary | ICD-10-CM | POA: Diagnosis not present

## 2013-04-29 DIAGNOSIS — M62838 Other muscle spasm: Secondary | ICD-10-CM | POA: Diagnosis not present

## 2013-04-30 DIAGNOSIS — J309 Allergic rhinitis, unspecified: Secondary | ICD-10-CM | POA: Diagnosis not present

## 2013-05-14 DIAGNOSIS — J309 Allergic rhinitis, unspecified: Secondary | ICD-10-CM | POA: Diagnosis not present

## 2013-05-21 DIAGNOSIS — J309 Allergic rhinitis, unspecified: Secondary | ICD-10-CM | POA: Diagnosis not present

## 2013-05-28 DIAGNOSIS — J309 Allergic rhinitis, unspecified: Secondary | ICD-10-CM | POA: Diagnosis not present

## 2013-05-29 DIAGNOSIS — IMO0002 Reserved for concepts with insufficient information to code with codable children: Secondary | ICD-10-CM | POA: Diagnosis not present

## 2013-05-29 DIAGNOSIS — M5137 Other intervertebral disc degeneration, lumbosacral region: Secondary | ICD-10-CM | POA: Diagnosis not present

## 2013-05-29 DIAGNOSIS — M47817 Spondylosis without myelopathy or radiculopathy, lumbosacral region: Secondary | ICD-10-CM | POA: Diagnosis not present

## 2013-06-06 DIAGNOSIS — J309 Allergic rhinitis, unspecified: Secondary | ICD-10-CM | POA: Diagnosis not present

## 2013-06-11 DIAGNOSIS — J309 Allergic rhinitis, unspecified: Secondary | ICD-10-CM | POA: Diagnosis not present

## 2013-06-18 DIAGNOSIS — J309 Allergic rhinitis, unspecified: Secondary | ICD-10-CM | POA: Diagnosis not present

## 2013-06-25 DIAGNOSIS — J309 Allergic rhinitis, unspecified: Secondary | ICD-10-CM | POA: Diagnosis not present

## 2013-06-28 DIAGNOSIS — J018 Other acute sinusitis: Secondary | ICD-10-CM | POA: Diagnosis not present

## 2013-07-02 DIAGNOSIS — J309 Allergic rhinitis, unspecified: Secondary | ICD-10-CM | POA: Diagnosis not present

## 2013-07-11 DIAGNOSIS — J309 Allergic rhinitis, unspecified: Secondary | ICD-10-CM | POA: Diagnosis not present

## 2013-07-23 DIAGNOSIS — J309 Allergic rhinitis, unspecified: Secondary | ICD-10-CM | POA: Diagnosis not present

## 2013-07-25 HISTORY — PX: HERNIA REPAIR: SHX51

## 2013-07-30 DIAGNOSIS — J311 Chronic nasopharyngitis: Secondary | ICD-10-CM | POA: Diagnosis not present

## 2013-07-30 DIAGNOSIS — I10 Essential (primary) hypertension: Secondary | ICD-10-CM | POA: Diagnosis not present

## 2013-07-30 DIAGNOSIS — K219 Gastro-esophageal reflux disease without esophagitis: Secondary | ICD-10-CM | POA: Diagnosis not present

## 2013-07-30 DIAGNOSIS — J309 Allergic rhinitis, unspecified: Secondary | ICD-10-CM | POA: Diagnosis not present

## 2013-08-06 DIAGNOSIS — IMO0002 Reserved for concepts with insufficient information to code with codable children: Secondary | ICD-10-CM | POA: Diagnosis not present

## 2013-08-06 DIAGNOSIS — M5137 Other intervertebral disc degeneration, lumbosacral region: Secondary | ICD-10-CM | POA: Diagnosis not present

## 2013-08-08 DIAGNOSIS — J309 Allergic rhinitis, unspecified: Secondary | ICD-10-CM | POA: Diagnosis not present

## 2013-08-15 DIAGNOSIS — J309 Allergic rhinitis, unspecified: Secondary | ICD-10-CM | POA: Diagnosis not present

## 2013-08-22 DIAGNOSIS — J309 Allergic rhinitis, unspecified: Secondary | ICD-10-CM | POA: Diagnosis not present

## 2013-08-27 DIAGNOSIS — J309 Allergic rhinitis, unspecified: Secondary | ICD-10-CM | POA: Diagnosis not present

## 2013-09-03 DIAGNOSIS — J309 Allergic rhinitis, unspecified: Secondary | ICD-10-CM | POA: Diagnosis not present

## 2013-09-17 DIAGNOSIS — J309 Allergic rhinitis, unspecified: Secondary | ICD-10-CM | POA: Diagnosis not present

## 2013-09-24 DIAGNOSIS — J309 Allergic rhinitis, unspecified: Secondary | ICD-10-CM | POA: Diagnosis not present

## 2013-10-01 DIAGNOSIS — J309 Allergic rhinitis, unspecified: Secondary | ICD-10-CM | POA: Diagnosis not present

## 2013-10-08 DIAGNOSIS — J309 Allergic rhinitis, unspecified: Secondary | ICD-10-CM | POA: Diagnosis not present

## 2013-10-15 DIAGNOSIS — J309 Allergic rhinitis, unspecified: Secondary | ICD-10-CM | POA: Diagnosis not present

## 2013-10-21 DIAGNOSIS — J309 Allergic rhinitis, unspecified: Secondary | ICD-10-CM | POA: Diagnosis not present

## 2013-10-22 DIAGNOSIS — J309 Allergic rhinitis, unspecified: Secondary | ICD-10-CM | POA: Diagnosis not present

## 2013-10-29 DIAGNOSIS — J309 Allergic rhinitis, unspecified: Secondary | ICD-10-CM | POA: Diagnosis not present

## 2013-11-05 DIAGNOSIS — J311 Chronic nasopharyngitis: Secondary | ICD-10-CM | POA: Diagnosis not present

## 2013-11-05 DIAGNOSIS — J45909 Unspecified asthma, uncomplicated: Secondary | ICD-10-CM | POA: Diagnosis not present

## 2013-11-05 DIAGNOSIS — H1045 Other chronic allergic conjunctivitis: Secondary | ICD-10-CM | POA: Diagnosis not present

## 2013-11-05 DIAGNOSIS — J3089 Other allergic rhinitis: Secondary | ICD-10-CM | POA: Diagnosis not present

## 2013-11-05 DIAGNOSIS — K219 Gastro-esophageal reflux disease without esophagitis: Secondary | ICD-10-CM | POA: Diagnosis not present

## 2013-11-05 DIAGNOSIS — J301 Allergic rhinitis due to pollen: Secondary | ICD-10-CM | POA: Diagnosis not present

## 2013-11-05 DIAGNOSIS — J309 Allergic rhinitis, unspecified: Secondary | ICD-10-CM | POA: Diagnosis not present

## 2013-11-05 DIAGNOSIS — I10 Essential (primary) hypertension: Secondary | ICD-10-CM | POA: Diagnosis not present

## 2013-11-12 DIAGNOSIS — J309 Allergic rhinitis, unspecified: Secondary | ICD-10-CM | POA: Diagnosis not present

## 2013-11-19 DIAGNOSIS — J309 Allergic rhinitis, unspecified: Secondary | ICD-10-CM | POA: Diagnosis not present

## 2013-11-26 DIAGNOSIS — J309 Allergic rhinitis, unspecified: Secondary | ICD-10-CM | POA: Diagnosis not present

## 2013-11-27 DIAGNOSIS — M545 Low back pain, unspecified: Secondary | ICD-10-CM | POA: Diagnosis not present

## 2013-11-27 DIAGNOSIS — M5137 Other intervertebral disc degeneration, lumbosacral region: Secondary | ICD-10-CM | POA: Diagnosis not present

## 2013-12-03 DIAGNOSIS — J309 Allergic rhinitis, unspecified: Secondary | ICD-10-CM | POA: Diagnosis not present

## 2013-12-19 DIAGNOSIS — J309 Allergic rhinitis, unspecified: Secondary | ICD-10-CM | POA: Diagnosis not present

## 2013-12-26 DIAGNOSIS — J45909 Unspecified asthma, uncomplicated: Secondary | ICD-10-CM | POA: Diagnosis not present

## 2013-12-26 DIAGNOSIS — J301 Allergic rhinitis due to pollen: Secondary | ICD-10-CM | POA: Diagnosis not present

## 2013-12-26 DIAGNOSIS — J019 Acute sinusitis, unspecified: Secondary | ICD-10-CM | POA: Diagnosis not present

## 2013-12-26 DIAGNOSIS — J3089 Other allergic rhinitis: Secondary | ICD-10-CM | POA: Diagnosis not present

## 2013-12-26 DIAGNOSIS — J209 Acute bronchitis, unspecified: Secondary | ICD-10-CM | POA: Diagnosis not present

## 2013-12-31 DIAGNOSIS — J309 Allergic rhinitis, unspecified: Secondary | ICD-10-CM | POA: Diagnosis not present

## 2014-01-14 DIAGNOSIS — J309 Allergic rhinitis, unspecified: Secondary | ICD-10-CM | POA: Diagnosis not present

## 2014-01-21 DIAGNOSIS — D649 Anemia, unspecified: Secondary | ICD-10-CM | POA: Diagnosis not present

## 2014-01-21 DIAGNOSIS — E785 Hyperlipidemia, unspecified: Secondary | ICD-10-CM | POA: Diagnosis not present

## 2014-01-21 DIAGNOSIS — J309 Allergic rhinitis, unspecified: Secondary | ICD-10-CM | POA: Diagnosis not present

## 2014-01-21 DIAGNOSIS — E038 Other specified hypothyroidism: Secondary | ICD-10-CM | POA: Diagnosis not present

## 2014-01-28 DIAGNOSIS — I10 Essential (primary) hypertension: Secondary | ICD-10-CM | POA: Diagnosis not present

## 2014-01-28 DIAGNOSIS — J309 Allergic rhinitis, unspecified: Secondary | ICD-10-CM | POA: Diagnosis not present

## 2014-01-28 DIAGNOSIS — J0100 Acute maxillary sinusitis, unspecified: Secondary | ICD-10-CM | POA: Diagnosis not present

## 2014-01-28 DIAGNOSIS — J329 Chronic sinusitis, unspecified: Secondary | ICD-10-CM | POA: Diagnosis not present

## 2014-01-30 DIAGNOSIS — R0602 Shortness of breath: Secondary | ICD-10-CM | POA: Diagnosis not present

## 2014-01-30 DIAGNOSIS — R079 Chest pain, unspecified: Secondary | ICD-10-CM | POA: Diagnosis not present

## 2014-01-30 DIAGNOSIS — R42 Dizziness and giddiness: Secondary | ICD-10-CM | POA: Diagnosis not present

## 2014-01-30 DIAGNOSIS — Z8589 Personal history of malignant neoplasm of other organs and systems: Secondary | ICD-10-CM | POA: Diagnosis not present

## 2014-01-30 DIAGNOSIS — E079 Disorder of thyroid, unspecified: Secondary | ICD-10-CM | POA: Diagnosis not present

## 2014-01-30 DIAGNOSIS — F489 Nonpsychotic mental disorder, unspecified: Secondary | ICD-10-CM | POA: Diagnosis not present

## 2014-01-30 DIAGNOSIS — F458 Other somatoform disorders: Secondary | ICD-10-CM | POA: Diagnosis not present

## 2014-01-30 DIAGNOSIS — Z859 Personal history of malignant neoplasm, unspecified: Secondary | ICD-10-CM | POA: Diagnosis not present

## 2014-01-30 DIAGNOSIS — Z880 Allergy status to penicillin: Secondary | ICD-10-CM | POA: Diagnosis not present

## 2014-01-30 DIAGNOSIS — Z79899 Other long term (current) drug therapy: Secondary | ICD-10-CM | POA: Diagnosis not present

## 2014-02-03 DIAGNOSIS — R0602 Shortness of breath: Secondary | ICD-10-CM | POA: Diagnosis not present

## 2014-02-03 DIAGNOSIS — E038 Other specified hypothyroidism: Secondary | ICD-10-CM | POA: Diagnosis not present

## 2014-02-04 DIAGNOSIS — J309 Allergic rhinitis, unspecified: Secondary | ICD-10-CM | POA: Diagnosis not present

## 2014-02-11 DIAGNOSIS — J309 Allergic rhinitis, unspecified: Secondary | ICD-10-CM | POA: Diagnosis not present

## 2014-02-18 DIAGNOSIS — J309 Allergic rhinitis, unspecified: Secondary | ICD-10-CM | POA: Diagnosis not present

## 2014-02-25 DIAGNOSIS — J309 Allergic rhinitis, unspecified: Secondary | ICD-10-CM | POA: Diagnosis not present

## 2014-02-26 DIAGNOSIS — Z1231 Encounter for screening mammogram for malignant neoplasm of breast: Secondary | ICD-10-CM | POA: Diagnosis not present

## 2014-02-26 DIAGNOSIS — R928 Other abnormal and inconclusive findings on diagnostic imaging of breast: Secondary | ICD-10-CM | POA: Diagnosis not present

## 2014-02-28 DIAGNOSIS — N63 Unspecified lump in unspecified breast: Secondary | ICD-10-CM | POA: Diagnosis not present

## 2014-02-28 DIAGNOSIS — N6019 Diffuse cystic mastopathy of unspecified breast: Secondary | ICD-10-CM | POA: Diagnosis not present

## 2014-03-04 DIAGNOSIS — J309 Allergic rhinitis, unspecified: Secondary | ICD-10-CM | POA: Diagnosis not present

## 2014-03-11 DIAGNOSIS — J309 Allergic rhinitis, unspecified: Secondary | ICD-10-CM | POA: Diagnosis not present

## 2014-03-18 DIAGNOSIS — J309 Allergic rhinitis, unspecified: Secondary | ICD-10-CM | POA: Diagnosis not present

## 2014-03-25 DIAGNOSIS — J309 Allergic rhinitis, unspecified: Secondary | ICD-10-CM | POA: Diagnosis not present

## 2014-03-25 DIAGNOSIS — R0602 Shortness of breath: Secondary | ICD-10-CM | POA: Diagnosis not present

## 2014-04-08 DIAGNOSIS — J309 Allergic rhinitis, unspecified: Secondary | ICD-10-CM | POA: Diagnosis not present

## 2014-04-09 ENCOUNTER — Encounter: Payer: Self-pay | Admitting: General Surgery

## 2014-04-09 ENCOUNTER — Ambulatory Visit (INDEPENDENT_AMBULATORY_CARE_PROVIDER_SITE_OTHER): Payer: Medicare Other | Admitting: General Surgery

## 2014-04-09 VITALS — BP 150/90 | HR 60 | Resp 14 | Ht 61.0 in | Wt 173.0 lb

## 2014-04-09 DIAGNOSIS — K4090 Unilateral inguinal hernia, without obstruction or gangrene, not specified as recurrent: Secondary | ICD-10-CM

## 2014-04-09 DIAGNOSIS — Z800 Family history of malignant neoplasm of digestive organs: Secondary | ICD-10-CM | POA: Diagnosis not present

## 2014-04-09 MED ORDER — POLYETHYLENE GLYCOL 3350 17 GM/SCOOP PO POWD: ORAL | Status: DC

## 2014-04-09 NOTE — Patient Instructions (Signed)
Patient to be scheduled for a colonoscopy. The patient is aware to call back for any questions or concerns.  Colonoscopy A colonoscopy is an exam to look at the entire large intestine (colon). This exam can help find problems such as tumors, polyps, inflammation, and areas of bleeding. The exam takes about 1 hour.  LET [facility] CARE PROVIDER KNOW ABOUT:   Any allergies you have.  All medicines you are taking, including vitamins, herbs, eye drops, creams, and over-the-counter medicines.  Previous problems you or members of your family have had with the use of anesthetics.  Any blood disorders you have.  Previous surgeries you have had.  Medical conditions you have. RISKS AND COMPLICATIONS  Generally, this is a safe procedure. However, as with any procedure, complications can occur. Possible complications include:  Bleeding.  Tearing or rupture of the colon wall.  Reaction to medicines given during the exam.  Infection (rare). BEFORE THE PROCEDURE   Ask your health care provider about changing or stopping your regular medicines.  You may be prescribed an oral bowel prep. This involves drinking a large amount of medicated liquid, starting the day before your procedure. The liquid will cause you to have multiple loose stools until your stool is almost clear or light green. This cleans out your colon in preparation for the procedure.  Do not eat or drink anything else once you have started the bowel prep, unless your health care provider tells you it is safe to do so.  Arrange for someone to drive you home after the procedure. PROCEDURE   You will be given medicine to help you relax (sedative).  You will lie on your side with your knees bent.  A long, flexible tube with a light and camera on the end (colonoscope) will be inserted through the rectum and into the colon. The camera sends video back to a computer screen as it moves through the colon. The colonoscope also releases  carbon dioxide gas to inflate the colon. This helps your health care provider see the area better.  During the exam, your health care provider may take a small tissue sample (biopsy) to be examined under a microscope if any abnormalities are found.  The exam is finished when the entire colon has been viewed. AFTER THE PROCEDURE   Do not drive for 24 hours after the exam.  You may have a small amount of blood in your stool.  You may pass moderate amounts of gas and have mild abdominal cramping or bloating. This is caused by the gas used to inflate your colon during the exam.  Ask when your test results will be ready and how you will get your results. Make sure you get your test results. Document Released: 07/08/2000 Document Revised: 05/01/2013 Document Reviewed: 03/18/2013 [facility] Patient Information 2015 [place], [place]. This information is not intended to replace advice given to you by your health care provider. Make sure you discuss any questions you have with your health care provider.

## 2014-04-09 NOTE — Progress Notes (Signed)
Patient ID: [name], female   DOB: 07/22/1946, 68 y.o.   MRN: [id number]  Chief Complaint  Patient presents with  . Other    New Pt evaluation of abdominal hernia    HPI [name] is a 68 y.o. female who presents for an evaluation of an abdominal hernia. The patient states the hernia has been present for approximately 2 months. She has not had any studies done for this. She complains of a bulge as well as pain located in this area. The hernia is located in the right inguinal area. She noticed this area after lifting a heavy box. She states later that day she had a sensation of stinging and burning.   HPI  Past Medical History  Diagnosis Date  . Diverticulosis   . Asthma   . Thyroid disease   . Bulging lumbar disc   . Cancer     thyroid  . Heart murmur   . DDD (degenerative disc disease), lumbar     Past Surgical History  Procedure Laterality Date  . Tonsillectomy  1952  . Excisional hemorrhoidectomy  1975  . Eye surgery  2011  . Breast surgery  [id number]    mass removed  . Thyroidectomy  1999  . Bladder surgery  1984  . Colonoscopy  2005    Family History  Problem Relation Age of Onset  . Heart disease Mother   . Cancer Father     colon  . Heart disease Brother     Social History History  Substance Use Topics  . Smoking status: Former Smoker -- 15 years  . Smokeless tobacco: Never Used  . Alcohol Use: No    Allergies  Allergen Reactions  . Penicillins Hives    Current Outpatient Prescriptions  Medication Sig Dispense Refill  . ALBUTEROL IN Inhale into the lungs as needed.      Marland Kitchen aspirin 81 MG tablet Take 81 mg by mouth daily.      . celecoxib (CELEBREX) 100 MG capsule Take 200 mg by mouth daily.      . cetirizine (ZYRTEC) 10 MG tablet Take 10 mg by mouth daily.      . Cholecalciferol (VITAMIN D3) 2000 UNITS TABS Take 1 tablet by mouth daily.      . cycloSPORINE (RESTASIS) 0.05 % ophthalmic emulsion Place 1 drop into both eyes 2 (two)  times daily.      Marland Kitchen estradiol-norethindrone (MIMVEY) 1-0.5 MG per tablet Take 1 tablet by mouth daily.      . Flaxseed, Linseed, (FLAXSEED OIL) 1000 MG CAPS Take 3 capsules by mouth daily.      Marland Kitchen FLUoxetine (PROZAC) 40 MG capsule Take 40 mg by mouth daily.      . fluticasone (FLONASE) 50 MCG/ACT nasal spray Place 1 spray into both nostrils daily.      . Fluticasone-Salmeterol (ADVAIR) 250-50 MCG/DOSE AEPB Inhale 1 puff into the lungs 3 (three) times daily.      . furosemide (LASIX) 20 MG tablet Take 20 mg by mouth daily.      Marland Kitchen HYDROcodone-acetaminophen (NORCO) 7.5-325 MG per tablet Take 1 tablet by mouth daily.      Marland Kitchen levothyroxine (SYNTHROID, LEVOTHROID) 150 MCG tablet Take 150 mcg by mouth daily before breakfast.      . potassium chloride SA (K-DUR,KLOR-CON) 20 MEQ tablet Take 20 mEq by mouth daily.      Marland Kitchen tiZANidine (ZANAFLEX) 4 MG capsule Take 4 mg by mouth 2 (two) times daily.      Marland Kitchen  zolpidem (AMBIEN) 10 MG tablet Take 10 mg by mouth at bedtime as needed for sleep.      . polyethylene glycol powder (GLYCOLAX/MIRALAX) powder 255 grams one bottle for colonoscopy prep  255 g  0   No current facility-administered medications for this visit.    Review of Systems Review of Systems  Constitutional: Negative.   Respiratory: Negative.   Cardiovascular: Negative.   Gastrointestinal: Negative.     Blood pressure 150/90, pulse 60, resp. rate 14, height 5\' 1"  (1.549 m), weight 173 lb (78.472 kg).  Physical Exam Physical Exam  Constitutional: She appears well-developed and well-nourished.  Eyes: Conjunctivae are normal. No scleral icterus.  Neck: Neck supple. No thyromegaly present.  Cardiovascular: Normal rate, regular rhythm and normal heart sounds.   No murmur heard. Pulmonary/Chest: Effort normal and breath sounds normal.  Abdominal: Soft. Normal appearance and bowel sounds are normal. There is no hepatosplenomegaly. There is no tenderness. A hernia is present. Hernia confirmed  positive in the right inguinal area ( medium sized, reducible. Mildly tender).  Lymphadenopathy:    She has no cervical adenopathy.    Data Reviewed  None  Assessment    Right inguinal hernia present. Pt has FH of colon CA. Last colonoscopy was 10 yrs ago. Discussd both with pt in full, advising on need for colonoscopy, laparoscopic repair of hernia, risks and benefits. Pt is agreeable.    Plan    Patient to be scheduled for a colonoscopy. Patient to be scheduled for a right inguinal hernia repair at a later date.      Patient has been scheduled for a colonoscopy on 04-23-14 at [facility].   This patient's surgery has been scheduled for 05-02-14.  It is okay for patient to continue 81 mg aspirin prior to both procedures.  Charmain Diosdado G 04/09/2014, 6:54 PM

## 2014-04-15 DIAGNOSIS — J309 Allergic rhinitis, unspecified: Secondary | ICD-10-CM | POA: Diagnosis not present

## 2014-04-22 ENCOUNTER — Ambulatory Visit: Payer: Self-pay | Admitting: General Surgery

## 2014-04-22 DIAGNOSIS — J309 Allergic rhinitis, unspecified: Secondary | ICD-10-CM | POA: Diagnosis not present

## 2014-04-23 ENCOUNTER — Ambulatory Visit: Payer: Self-pay | Admitting: General Surgery

## 2014-04-23 DIAGNOSIS — Z800 Family history of malignant neoplasm of digestive organs: Secondary | ICD-10-CM

## 2014-04-23 DIAGNOSIS — R011 Cardiac murmur, unspecified: Secondary | ICD-10-CM | POA: Diagnosis not present

## 2014-04-23 DIAGNOSIS — K5730 Diverticulosis of large intestine without perforation or abscess without bleeding: Secondary | ICD-10-CM

## 2014-04-23 DIAGNOSIS — K219 Gastro-esophageal reflux disease without esophagitis: Secondary | ICD-10-CM | POA: Diagnosis not present

## 2014-04-23 DIAGNOSIS — Z880 Allergy status to penicillin: Secondary | ICD-10-CM | POA: Diagnosis not present

## 2014-04-23 DIAGNOSIS — Z87891 Personal history of nicotine dependence: Secondary | ICD-10-CM | POA: Diagnosis not present

## 2014-04-23 DIAGNOSIS — Z7982 Long term (current) use of aspirin: Secondary | ICD-10-CM | POA: Diagnosis not present

## 2014-04-23 DIAGNOSIS — Z8489 Family history of other specified conditions: Secondary | ICD-10-CM | POA: Diagnosis not present

## 2014-04-23 DIAGNOSIS — F3289 Other specified depressive episodes: Secondary | ICD-10-CM | POA: Diagnosis not present

## 2014-04-23 DIAGNOSIS — J45909 Unspecified asthma, uncomplicated: Secondary | ICD-10-CM | POA: Diagnosis not present

## 2014-04-23 DIAGNOSIS — M549 Dorsalgia, unspecified: Secondary | ICD-10-CM | POA: Diagnosis not present

## 2014-04-23 DIAGNOSIS — Z79899 Other long term (current) drug therapy: Secondary | ICD-10-CM | POA: Diagnosis not present

## 2014-04-23 DIAGNOSIS — F329 Major depressive disorder, single episode, unspecified: Secondary | ICD-10-CM | POA: Diagnosis not present

## 2014-04-23 DIAGNOSIS — Z1211 Encounter for screening for malignant neoplasm of colon: Secondary | ICD-10-CM | POA: Diagnosis not present

## 2014-04-23 DIAGNOSIS — Z85850 Personal history of malignant neoplasm of thyroid: Secondary | ICD-10-CM | POA: Diagnosis not present

## 2014-04-24 ENCOUNTER — Encounter: Payer: Self-pay | Admitting: General Surgery

## 2014-04-25 DIAGNOSIS — Z23 Encounter for immunization: Secondary | ICD-10-CM | POA: Diagnosis not present

## 2014-04-28 ENCOUNTER — Ambulatory Visit: Payer: Self-pay | Admitting: Anesthesiology

## 2014-04-28 ENCOUNTER — Telehealth: Payer: Self-pay | Admitting: *Deleted

## 2014-04-28 DIAGNOSIS — K449 Diaphragmatic hernia without obstruction or gangrene: Secondary | ICD-10-CM | POA: Diagnosis not present

## 2014-04-28 DIAGNOSIS — Z01812 Encounter for preprocedural laboratory examination: Secondary | ICD-10-CM | POA: Diagnosis not present

## 2014-04-28 LAB — POTASSIUM: Potassium: 4 mmol/L (ref 3.5–5.1)

## 2014-04-28 NOTE — Telephone Encounter (Signed)
Patient was contacted today and questions addressed. This patient was told that she could not drive for [id number] days post op. She can ride in the car when she feels comfortable but was encouraged to get out every hour. Patient can go up and down stairs but limit to once or twice for the first week after surgery. Also, as far as breast follow up, she will need to have 6 month check as the area of concern turned out to be cysts per Dr. Jamal Collin.   Patient verbalizes understanding.  We will proceed with hernia surgery as scheduled for 05-02-14 at [facility].

## 2014-04-28 NOTE — Telephone Encounter (Signed)
Pt is having hernia surgery on the 9th of October and she just has a few questions to ask you pertaining her surgery.

## 2014-04-29 DIAGNOSIS — J3089 Other allergic rhinitis: Secondary | ICD-10-CM | POA: Diagnosis not present

## 2014-05-02 ENCOUNTER — Ambulatory Visit: Payer: Self-pay | Admitting: General Surgery

## 2014-05-02 DIAGNOSIS — I499 Cardiac arrhythmia, unspecified: Secondary | ICD-10-CM | POA: Diagnosis not present

## 2014-05-02 DIAGNOSIS — R609 Edema, unspecified: Secondary | ICD-10-CM | POA: Diagnosis not present

## 2014-05-02 DIAGNOSIS — Z87891 Personal history of nicotine dependence: Secondary | ICD-10-CM | POA: Diagnosis not present

## 2014-05-02 DIAGNOSIS — F329 Major depressive disorder, single episode, unspecified: Secondary | ICD-10-CM | POA: Diagnosis not present

## 2014-05-02 DIAGNOSIS — Z7951 Long term (current) use of inhaled steroids: Secondary | ICD-10-CM | POA: Diagnosis not present

## 2014-05-02 DIAGNOSIS — I10 Essential (primary) hypertension: Secondary | ICD-10-CM | POA: Diagnosis not present

## 2014-05-02 DIAGNOSIS — M1990 Unspecified osteoarthritis, unspecified site: Secondary | ICD-10-CM | POA: Diagnosis not present

## 2014-05-02 DIAGNOSIS — Z800 Family history of malignant neoplasm of digestive organs: Secondary | ICD-10-CM | POA: Diagnosis not present

## 2014-05-02 DIAGNOSIS — K30 Functional dyspepsia: Secondary | ICD-10-CM | POA: Diagnosis not present

## 2014-05-02 DIAGNOSIS — Z79899 Other long term (current) drug therapy: Secondary | ICD-10-CM | POA: Diagnosis not present

## 2014-05-02 DIAGNOSIS — Z8711 Personal history of peptic ulcer disease: Secondary | ICD-10-CM | POA: Diagnosis not present

## 2014-05-02 DIAGNOSIS — R05 Cough: Secondary | ICD-10-CM | POA: Diagnosis not present

## 2014-05-02 DIAGNOSIS — Z881 Allergy status to other antibiotic agents status: Secondary | ICD-10-CM | POA: Diagnosis not present

## 2014-05-02 DIAGNOSIS — J45909 Unspecified asthma, uncomplicated: Secondary | ICD-10-CM | POA: Diagnosis not present

## 2014-05-02 DIAGNOSIS — Z7982 Long term (current) use of aspirin: Secondary | ICD-10-CM | POA: Diagnosis not present

## 2014-05-02 DIAGNOSIS — Z85850 Personal history of malignant neoplasm of thyroid: Secondary | ICD-10-CM | POA: Diagnosis not present

## 2014-05-02 DIAGNOSIS — M5136 Other intervertebral disc degeneration, lumbar region: Secondary | ICD-10-CM | POA: Diagnosis not present

## 2014-05-02 DIAGNOSIS — K4090 Unilateral inguinal hernia, without obstruction or gangrene, not specified as recurrent: Secondary | ICD-10-CM

## 2014-05-02 DIAGNOSIS — Z884 Allergy status to anesthetic agent status: Secondary | ICD-10-CM | POA: Diagnosis not present

## 2014-05-02 DIAGNOSIS — Z8489 Family history of other specified conditions: Secondary | ICD-10-CM | POA: Diagnosis not present

## 2014-05-02 DIAGNOSIS — F410 Panic disorder [episodic paroxysmal anxiety] without agoraphobia: Secondary | ICD-10-CM | POA: Diagnosis not present

## 2014-05-02 DIAGNOSIS — Z882 Allergy status to sulfonamides status: Secondary | ICD-10-CM | POA: Diagnosis not present

## 2014-05-05 ENCOUNTER — Encounter: Payer: Self-pay | Admitting: General Surgery

## 2014-05-06 DIAGNOSIS — J3089 Other allergic rhinitis: Secondary | ICD-10-CM | POA: Diagnosis not present

## 2014-05-06 DIAGNOSIS — J301 Allergic rhinitis due to pollen: Secondary | ICD-10-CM | POA: Diagnosis not present

## 2014-05-12 ENCOUNTER — Ambulatory Visit (INDEPENDENT_AMBULATORY_CARE_PROVIDER_SITE_OTHER): Payer: Self-pay | Admitting: General Surgery

## 2014-05-12 ENCOUNTER — Encounter: Payer: Self-pay | Admitting: General Surgery

## 2014-05-12 VITALS — BP 130/74 | HR 72 | Resp 14 | Ht 61.0 in | Wt 176.0 lb

## 2014-05-12 DIAGNOSIS — K4090 Unilateral inguinal hernia, without obstruction or gangrene, not specified as recurrent: Secondary | ICD-10-CM

## 2014-05-12 NOTE — Patient Instructions (Signed)
Patient to return in 1 month for follow up. The patient is aware to call back for any questions or concerns.  

## 2014-05-12 NOTE — Progress Notes (Signed)
Patient ID: [name], female   DOB: 12-18-1945, 68 y.o.   MRN: [id number]  The patient presents of a post op lap right inguinal hernia repair. The procedure was performed on 05/02/14. The patient is doing well. No complaints at this time.   Hernia repair intact. Port sites healing well. No signs of infection.  May advance activity as tolerated. F/U in 1 month

## 2014-05-14 ENCOUNTER — Encounter: Payer: Self-pay | Admitting: General Surgery

## 2014-05-20 DIAGNOSIS — J301 Allergic rhinitis due to pollen: Secondary | ICD-10-CM | POA: Diagnosis not present

## 2014-05-20 DIAGNOSIS — J3089 Other allergic rhinitis: Secondary | ICD-10-CM | POA: Diagnosis not present

## 2014-05-22 DIAGNOSIS — E038 Other specified hypothyroidism: Secondary | ICD-10-CM | POA: Diagnosis not present

## 2014-05-22 DIAGNOSIS — R3911 Hesitancy of micturition: Secondary | ICD-10-CM | POA: Diagnosis not present

## 2014-05-22 DIAGNOSIS — N399 Disorder of urinary system, unspecified: Secondary | ICD-10-CM | POA: Diagnosis not present

## 2014-05-22 DIAGNOSIS — I10 Essential (primary) hypertension: Secondary | ICD-10-CM | POA: Diagnosis not present

## 2014-05-23 DIAGNOSIS — M5116 Intervertebral disc disorders with radiculopathy, lumbar region: Secondary | ICD-10-CM | POA: Insufficient documentation

## 2014-05-23 DIAGNOSIS — M5136 Other intervertebral disc degeneration, lumbar region: Secondary | ICD-10-CM | POA: Insufficient documentation

## 2014-05-23 DIAGNOSIS — M5416 Radiculopathy, lumbar region: Secondary | ICD-10-CM | POA: Diagnosis not present

## 2014-05-27 ENCOUNTER — Encounter: Payer: Self-pay | Admitting: General Surgery

## 2014-05-27 DIAGNOSIS — J301 Allergic rhinitis due to pollen: Secondary | ICD-10-CM | POA: Diagnosis not present

## 2014-05-27 DIAGNOSIS — J3089 Other allergic rhinitis: Secondary | ICD-10-CM | POA: Diagnosis not present

## 2014-06-03 DIAGNOSIS — J301 Allergic rhinitis due to pollen: Secondary | ICD-10-CM | POA: Diagnosis not present

## 2014-06-03 DIAGNOSIS — J3089 Other allergic rhinitis: Secondary | ICD-10-CM | POA: Diagnosis not present

## 2014-06-10 ENCOUNTER — Ambulatory Visit (INDEPENDENT_AMBULATORY_CARE_PROVIDER_SITE_OTHER): Payer: Self-pay | Admitting: General Surgery

## 2014-06-10 ENCOUNTER — Encounter: Payer: Self-pay | Admitting: General Surgery

## 2014-06-10 VITALS — BP 140/76 | HR 74 | Resp 12 | Ht 61.0 in | Wt 174.0 lb

## 2014-06-10 DIAGNOSIS — J3089 Other allergic rhinitis: Secondary | ICD-10-CM | POA: Diagnosis not present

## 2014-06-10 DIAGNOSIS — J301 Allergic rhinitis due to pollen: Secondary | ICD-10-CM | POA: Diagnosis not present

## 2014-06-10 DIAGNOSIS — K4090 Unilateral inguinal hernia, without obstruction or gangrene, not specified as recurrent: Secondary | ICD-10-CM

## 2014-06-10 NOTE — Progress Notes (Signed)
The patient presents of a post op lap right inguinal hernia repair. The procedure was performed on 05/02/14. The patient is doing well. No complaints at this time.   Hernia repair intact. Port sites are well healed. No signs of infection.  Patient to return as needed.

## 2014-06-10 NOTE — Patient Instructions (Addendum)
Patient to return as needed. Call for any concerns or new problems.

## 2014-06-24 DIAGNOSIS — J301 Allergic rhinitis due to pollen: Secondary | ICD-10-CM | POA: Diagnosis not present

## 2014-06-24 DIAGNOSIS — J3089 Other allergic rhinitis: Secondary | ICD-10-CM | POA: Diagnosis not present

## 2014-07-03 DIAGNOSIS — J3089 Other allergic rhinitis: Secondary | ICD-10-CM | POA: Diagnosis not present

## 2014-07-03 DIAGNOSIS — J301 Allergic rhinitis due to pollen: Secondary | ICD-10-CM | POA: Diagnosis not present

## 2014-07-08 ENCOUNTER — Emergency Department: Payer: Self-pay | Admitting: Internal Medicine

## 2014-07-08 DIAGNOSIS — Z7982 Long term (current) use of aspirin: Secondary | ICD-10-CM | POA: Diagnosis not present

## 2014-07-08 DIAGNOSIS — F419 Anxiety disorder, unspecified: Secondary | ICD-10-CM | POA: Diagnosis not present

## 2014-07-08 DIAGNOSIS — Z791 Long term (current) use of non-steroidal anti-inflammatories (NSAID): Secondary | ICD-10-CM | POA: Diagnosis not present

## 2014-07-08 DIAGNOSIS — Z87891 Personal history of nicotine dependence: Secondary | ICD-10-CM | POA: Diagnosis not present

## 2014-07-08 DIAGNOSIS — Z7951 Long term (current) use of inhaled steroids: Secondary | ICD-10-CM | POA: Diagnosis not present

## 2014-07-08 DIAGNOSIS — R0602 Shortness of breath: Secondary | ICD-10-CM | POA: Diagnosis not present

## 2014-07-08 DIAGNOSIS — F411 Generalized anxiety disorder: Secondary | ICD-10-CM | POA: Diagnosis not present

## 2014-07-08 DIAGNOSIS — R0789 Other chest pain: Secondary | ICD-10-CM | POA: Diagnosis not present

## 2014-07-08 DIAGNOSIS — R079 Chest pain, unspecified: Secondary | ICD-10-CM | POA: Diagnosis not present

## 2014-07-08 DIAGNOSIS — J3089 Other allergic rhinitis: Secondary | ICD-10-CM | POA: Diagnosis not present

## 2014-07-08 DIAGNOSIS — J301 Allergic rhinitis due to pollen: Secondary | ICD-10-CM | POA: Diagnosis not present

## 2014-07-08 DIAGNOSIS — I10 Essential (primary) hypertension: Secondary | ICD-10-CM | POA: Diagnosis not present

## 2014-07-08 DIAGNOSIS — J45909 Unspecified asthma, uncomplicated: Secondary | ICD-10-CM | POA: Diagnosis not present

## 2014-07-08 DIAGNOSIS — Z79899 Other long term (current) drug therapy: Secondary | ICD-10-CM | POA: Diagnosis not present

## 2014-07-08 DIAGNOSIS — Z880 Allergy status to penicillin: Secondary | ICD-10-CM | POA: Diagnosis not present

## 2014-07-08 LAB — URINALYSIS, COMPLETE
BLOOD: NEGATIVE
Bilirubin,UR: NEGATIVE
Glucose,UR: NEGATIVE mg/dL (ref 0–75)
Ketone: NEGATIVE
Leukocyte Esterase: NEGATIVE
Nitrite: NEGATIVE
PROTEIN: NEGATIVE
Ph: 8 (ref 4.5–8.0)
Specific Gravity: 1.006 (ref 1.003–1.030)
Squamous Epithelial: 17
WBC UR: 1 /HPF (ref 0–5)

## 2014-07-08 LAB — CBC
HCT: 43.3 % (ref 35.0–47.0)
HGB: 14.4 g/dL (ref 12.0–16.0)
MCH: 29.8 pg (ref 26.0–34.0)
MCHC: 33.4 g/dL (ref 32.0–36.0)
MCV: 89 fL (ref 80–100)
Platelet: 357 10*3/uL (ref 150–440)
RBC: 4.85 10*6/uL (ref 3.80–5.20)
RDW: 13.8 % (ref 11.5–14.5)
WBC: 8.2 10*3/uL (ref 3.6–11.0)

## 2014-07-08 LAB — COMPREHENSIVE METABOLIC PANEL
ANION GAP: 8 (ref 7–16)
Albumin: 4.1 g/dL (ref 3.4–5.0)
Alkaline Phosphatase: 66 U/L
BUN: 13 mg/dL (ref 7–18)
Bilirubin,Total: 0.5 mg/dL (ref 0.2–1.0)
CHLORIDE: 104 mmol/L (ref 98–107)
Calcium, Total: 9.5 mg/dL (ref 8.5–10.1)
Co2: 25 mmol/L (ref 21–32)
Creatinine: 1 mg/dL (ref 0.60–1.30)
EGFR (African American): >60
GFR CALC NON AF AMER: 58 — AB
Glucose: 108 mg/dL — ABNORMAL HIGH (ref 65–99)
Osmolality: 274 (ref 275–301)
POTASSIUM: 3.7 mmol/L (ref 3.5–5.1)
SGOT(AST): 20 U/L (ref 15–37)
SGPT (ALT): 39 U/L
SODIUM: 137 mmol/L (ref 136–145)
TOTAL PROTEIN: 7.5 g/dL (ref 6.4–8.2)

## 2014-07-08 LAB — TROPONIN I
Troponin-I: <0.02 ng/mL
Troponin-I: <0.02 ng/mL

## 2014-07-10 DIAGNOSIS — J3089 Other allergic rhinitis: Secondary | ICD-10-CM | POA: Diagnosis not present

## 2014-07-10 DIAGNOSIS — J301 Allergic rhinitis due to pollen: Secondary | ICD-10-CM | POA: Diagnosis not present

## 2014-07-15 DIAGNOSIS — J3089 Other allergic rhinitis: Secondary | ICD-10-CM | POA: Diagnosis not present

## 2014-07-15 DIAGNOSIS — J301 Allergic rhinitis due to pollen: Secondary | ICD-10-CM | POA: Diagnosis not present

## 2014-07-22 DIAGNOSIS — J301 Allergic rhinitis due to pollen: Secondary | ICD-10-CM | POA: Diagnosis not present

## 2014-07-22 DIAGNOSIS — J3089 Other allergic rhinitis: Secondary | ICD-10-CM | POA: Diagnosis not present

## 2014-07-31 DIAGNOSIS — J3089 Other allergic rhinitis: Secondary | ICD-10-CM | POA: Diagnosis not present

## 2014-07-31 DIAGNOSIS — J301 Allergic rhinitis due to pollen: Secondary | ICD-10-CM | POA: Diagnosis not present

## 2014-08-05 DIAGNOSIS — J3089 Other allergic rhinitis: Secondary | ICD-10-CM | POA: Diagnosis not present

## 2014-08-05 DIAGNOSIS — J301 Allergic rhinitis due to pollen: Secondary | ICD-10-CM | POA: Diagnosis not present

## 2014-08-08 DIAGNOSIS — I10 Essential (primary) hypertension: Secondary | ICD-10-CM | POA: Diagnosis not present

## 2014-08-08 DIAGNOSIS — K219 Gastro-esophageal reflux disease without esophagitis: Secondary | ICD-10-CM | POA: Diagnosis not present

## 2014-08-08 DIAGNOSIS — M353 Polymyalgia rheumatica: Secondary | ICD-10-CM | POA: Diagnosis not present

## 2014-08-08 DIAGNOSIS — Z8739 Personal history of other diseases of the musculoskeletal system and connective tissue: Secondary | ICD-10-CM | POA: Diagnosis not present

## 2014-08-12 DIAGNOSIS — J301 Allergic rhinitis due to pollen: Secondary | ICD-10-CM | POA: Diagnosis not present

## 2014-08-12 DIAGNOSIS — M353 Polymyalgia rheumatica: Secondary | ICD-10-CM | POA: Diagnosis not present

## 2014-08-12 DIAGNOSIS — I10 Essential (primary) hypertension: Secondary | ICD-10-CM | POA: Diagnosis not present

## 2014-08-12 DIAGNOSIS — J3089 Other allergic rhinitis: Secondary | ICD-10-CM | POA: Diagnosis not present

## 2014-08-21 DIAGNOSIS — J3089 Other allergic rhinitis: Secondary | ICD-10-CM | POA: Diagnosis not present

## 2014-08-21 DIAGNOSIS — J301 Allergic rhinitis due to pollen: Secondary | ICD-10-CM | POA: Diagnosis not present

## 2014-08-22 DIAGNOSIS — J301 Allergic rhinitis due to pollen: Secondary | ICD-10-CM | POA: Diagnosis not present

## 2014-08-22 DIAGNOSIS — J0110 Acute frontal sinusitis, unspecified: Secondary | ICD-10-CM | POA: Diagnosis not present

## 2014-08-22 DIAGNOSIS — J3089 Other allergic rhinitis: Secondary | ICD-10-CM | POA: Diagnosis not present

## 2014-08-22 DIAGNOSIS — J4540 Moderate persistent asthma, uncomplicated: Secondary | ICD-10-CM | POA: Diagnosis not present

## 2014-08-26 DIAGNOSIS — J4530 Mild persistent asthma, uncomplicated: Secondary | ICD-10-CM | POA: Diagnosis not present

## 2014-08-26 DIAGNOSIS — J019 Acute sinusitis, unspecified: Secondary | ICD-10-CM | POA: Diagnosis not present

## 2014-08-26 DIAGNOSIS — J301 Allergic rhinitis due to pollen: Secondary | ICD-10-CM | POA: Diagnosis not present

## 2014-08-26 DIAGNOSIS — J3089 Other allergic rhinitis: Secondary | ICD-10-CM | POA: Diagnosis not present

## 2014-09-02 DIAGNOSIS — J3089 Other allergic rhinitis: Secondary | ICD-10-CM | POA: Diagnosis not present

## 2014-09-02 DIAGNOSIS — J301 Allergic rhinitis due to pollen: Secondary | ICD-10-CM | POA: Diagnosis not present

## 2014-09-10 DIAGNOSIS — M5136 Other intervertebral disc degeneration, lumbar region: Secondary | ICD-10-CM | POA: Diagnosis not present

## 2014-09-10 DIAGNOSIS — M5416 Radiculopathy, lumbar region: Secondary | ICD-10-CM | POA: Diagnosis not present

## 2014-09-11 DIAGNOSIS — J301 Allergic rhinitis due to pollen: Secondary | ICD-10-CM | POA: Diagnosis not present

## 2014-09-11 DIAGNOSIS — J3089 Other allergic rhinitis: Secondary | ICD-10-CM | POA: Diagnosis not present

## 2014-09-16 DIAGNOSIS — M1990 Unspecified osteoarthritis, unspecified site: Secondary | ICD-10-CM | POA: Diagnosis not present

## 2014-09-16 DIAGNOSIS — M545 Low back pain: Secondary | ICD-10-CM | POA: Diagnosis not present

## 2014-09-16 DIAGNOSIS — J3089 Other allergic rhinitis: Secondary | ICD-10-CM | POA: Diagnosis not present

## 2014-09-16 DIAGNOSIS — E038 Other specified hypothyroidism: Secondary | ICD-10-CM | POA: Diagnosis not present

## 2014-09-16 DIAGNOSIS — N3010 Interstitial cystitis (chronic) without hematuria: Secondary | ICD-10-CM | POA: Diagnosis not present

## 2014-09-16 DIAGNOSIS — J3081 Allergic rhinitis due to animal (cat) (dog) hair and dander: Secondary | ICD-10-CM | POA: Diagnosis not present

## 2014-09-16 DIAGNOSIS — N6001 Solitary cyst of right breast: Secondary | ICD-10-CM | POA: Diagnosis not present

## 2014-09-16 DIAGNOSIS — J301 Allergic rhinitis due to pollen: Secondary | ICD-10-CM | POA: Diagnosis not present

## 2014-09-16 DIAGNOSIS — N63 Unspecified lump in breast: Secondary | ICD-10-CM | POA: Diagnosis not present

## 2014-09-23 DIAGNOSIS — J3089 Other allergic rhinitis: Secondary | ICD-10-CM | POA: Diagnosis not present

## 2014-09-23 DIAGNOSIS — J301 Allergic rhinitis due to pollen: Secondary | ICD-10-CM | POA: Diagnosis not present

## 2014-09-30 DIAGNOSIS — J301 Allergic rhinitis due to pollen: Secondary | ICD-10-CM | POA: Diagnosis not present

## 2014-09-30 DIAGNOSIS — J3089 Other allergic rhinitis: Secondary | ICD-10-CM | POA: Diagnosis not present

## 2014-10-07 DIAGNOSIS — J3089 Other allergic rhinitis: Secondary | ICD-10-CM | POA: Diagnosis not present

## 2014-10-07 DIAGNOSIS — J301 Allergic rhinitis due to pollen: Secondary | ICD-10-CM | POA: Diagnosis not present

## 2014-10-14 DIAGNOSIS — J301 Allergic rhinitis due to pollen: Secondary | ICD-10-CM | POA: Diagnosis not present

## 2014-10-14 DIAGNOSIS — J3089 Other allergic rhinitis: Secondary | ICD-10-CM | POA: Diagnosis not present

## 2014-10-21 DIAGNOSIS — J301 Allergic rhinitis due to pollen: Secondary | ICD-10-CM | POA: Diagnosis not present

## 2014-10-21 DIAGNOSIS — J3089 Other allergic rhinitis: Secondary | ICD-10-CM | POA: Diagnosis not present

## 2014-10-28 DIAGNOSIS — J301 Allergic rhinitis due to pollen: Secondary | ICD-10-CM | POA: Diagnosis not present

## 2014-10-28 DIAGNOSIS — J3089 Other allergic rhinitis: Secondary | ICD-10-CM | POA: Diagnosis not present

## 2014-11-04 DIAGNOSIS — M5136 Other intervertebral disc degeneration, lumbar region: Secondary | ICD-10-CM | POA: Diagnosis not present

## 2014-11-04 DIAGNOSIS — M5416 Radiculopathy, lumbar region: Secondary | ICD-10-CM | POA: Diagnosis not present

## 2014-11-06 DIAGNOSIS — J3089 Other allergic rhinitis: Secondary | ICD-10-CM | POA: Diagnosis not present

## 2014-11-06 DIAGNOSIS — J301 Allergic rhinitis due to pollen: Secondary | ICD-10-CM | POA: Diagnosis not present

## 2014-11-15 NOTE — Op Note (Signed)
PATIENT NAME:  [name], [name] MR#:  [id number] DATE OF BIRTH:  05/05/1946  DATE OF PROCEDURE:  05/02/2014  PREOPERATIVE DIAGNOSIS: Right inguinal hernia.   POSTOPERATIVE DIAGNOSIS: Right inguinal hernia, indirect variety.   OPERATION: Laparoscopy, repair of right hernia.   SURGEON: Mckinley Jewel, M.D.   ANESTHESIA: General.   COMPLICATIONS: None.   ESTIMATED BLOOD LOSS: Minimal.   DRAINS: None.   DESCRIPTION OF PROCEDURE: The patient was put to sleep in the supine position on the operating room table. Foley catheter was inserted and the abdomen was prepped and draped out as a sterile field. Initial port site was placed just above the umbilicus and a small 1 cm incision was made and deepened through to the fascia which was lifted up and a Veress needle with the InnerDyne sleeve positioned in the peritoneal cavity. This was verified with the hanging drop method and pneumoperitoneum was obtained. A 10 mm port was placed and the camera was introduced. There was good visualization of the peritoneal cavity. Left lateral 5 mm port and a right lateral 11 mm ports were placed. The patient had a sizable indirect hernia with no contents noted within it. The small bowel was lying right at the internal ring area. The peritoneum overlying the inguinal canal above the internal ring area was incised from the medial to the lateral aspect and carefully dissected down. The hernial sac was then dissected off completely from the inguinal canal and freed up back into the abdominal cavity. The posterior wall was satisfactorily delineated, including the pubic tubercle and the inguinal ligament. Bleeding was controlled with cautery. A Bard 3-D mesh was then positioned into the abdominal cavity and then placed against the posterior wall of the inguinal canal with the median marked and overlying the pubic tubercle. A SecureStrap was used to tack it to the pubic tubercle and muscular tissue medially, the inguinal ligament  below and the superior aspect also tacked with the lateral ends left alone. A satisfactory repair was obtained. The peritoneal covering was then reapproximated with the [place] also. After ensuring hemostasis, the port sites of the right lateral and umbilical area were closed with 0 Vicryl. Pneumoperitoneum was released. The left lateral port was removed. The skin incisions were closed with subcuticular 4-0 Vicryl covered with Dermabond. Foley catheter was removed. The patient was subsequently extubated and returned to the recovery room in stable condition.  ____________________________ S.Robinette Haines, MD sgs:sb D: 05/05/2014 07:40:04 ET T: 05/05/2014 09:21:24 ET JOB#: [id number]  cc: S.G. Jamal Collin, MD, <Dictator> [facility] Robinette Haines MD ELECTRONICALLY SIGNED 05/05/2014 15:37

## 2014-11-18 DIAGNOSIS — J3089 Other allergic rhinitis: Secondary | ICD-10-CM | POA: Diagnosis not present

## 2014-11-18 DIAGNOSIS — J301 Allergic rhinitis due to pollen: Secondary | ICD-10-CM | POA: Diagnosis not present

## 2014-12-04 DIAGNOSIS — J449 Chronic obstructive pulmonary disease, unspecified: Secondary | ICD-10-CM | POA: Diagnosis not present

## 2014-12-04 DIAGNOSIS — I10 Essential (primary) hypertension: Secondary | ICD-10-CM | POA: Diagnosis not present

## 2014-12-04 DIAGNOSIS — J3089 Other allergic rhinitis: Secondary | ICD-10-CM | POA: Diagnosis not present

## 2014-12-04 DIAGNOSIS — M4800 Spinal stenosis, site unspecified: Secondary | ICD-10-CM | POA: Diagnosis not present

## 2014-12-04 DIAGNOSIS — J301 Allergic rhinitis due to pollen: Secondary | ICD-10-CM | POA: Diagnosis not present

## 2014-12-04 DIAGNOSIS — M549 Dorsalgia, unspecified: Secondary | ICD-10-CM | POA: Diagnosis not present

## 2014-12-05 ENCOUNTER — Other Ambulatory Visit: Payer: Self-pay | Admitting: Internal Medicine

## 2014-12-05 DIAGNOSIS — M48061 Spinal stenosis, lumbar region without neurogenic claudication: Secondary | ICD-10-CM

## 2014-12-09 DIAGNOSIS — J301 Allergic rhinitis due to pollen: Secondary | ICD-10-CM | POA: Diagnosis not present

## 2014-12-09 DIAGNOSIS — J3089 Other allergic rhinitis: Secondary | ICD-10-CM | POA: Diagnosis not present

## 2014-12-10 DIAGNOSIS — M5416 Radiculopathy, lumbar region: Secondary | ICD-10-CM | POA: Diagnosis not present

## 2014-12-10 DIAGNOSIS — M5136 Other intervertebral disc degeneration, lumbar region: Secondary | ICD-10-CM | POA: Diagnosis not present

## 2014-12-15 ENCOUNTER — Ambulatory Visit
Admission: RE | Admit: 2014-12-15 | Discharge: 2014-12-15 | Disposition: A | Discharge status: Home / Self Care | Payer: Medicare Other | Source: Ambulatory Visit | Attending: Internal Medicine | Admitting: Internal Medicine

## 2014-12-15 DIAGNOSIS — M5126 Other intervertebral disc displacement, lumbar region: Secondary | ICD-10-CM | POA: Insufficient documentation

## 2014-12-15 DIAGNOSIS — M47816 Spondylosis without myelopathy or radiculopathy, lumbar region: Secondary | ICD-10-CM | POA: Insufficient documentation

## 2014-12-15 DIAGNOSIS — M5186 Other intervertebral disc disorders, lumbar region: Secondary | ICD-10-CM | POA: Diagnosis not present

## 2014-12-15 DIAGNOSIS — M5432 Sciatica, left side: Secondary | ICD-10-CM | POA: Diagnosis present

## 2014-12-15 DIAGNOSIS — M549 Dorsalgia, unspecified: Secondary | ICD-10-CM | POA: Diagnosis present

## 2014-12-15 DIAGNOSIS — M48061 Spinal stenosis, lumbar region without neurogenic claudication: Secondary | ICD-10-CM

## 2014-12-15 DIAGNOSIS — M5431 Sciatica, right side: Secondary | ICD-10-CM | POA: Diagnosis present

## 2014-12-16 DIAGNOSIS — J3089 Other allergic rhinitis: Secondary | ICD-10-CM | POA: Diagnosis not present

## 2014-12-16 DIAGNOSIS — J301 Allergic rhinitis due to pollen: Secondary | ICD-10-CM | POA: Diagnosis not present

## 2014-12-17 DIAGNOSIS — M5416 Radiculopathy, lumbar region: Secondary | ICD-10-CM | POA: Diagnosis not present

## 2014-12-17 DIAGNOSIS — M5136 Other intervertebral disc degeneration, lumbar region: Secondary | ICD-10-CM | POA: Diagnosis not present

## 2014-12-30 DIAGNOSIS — R609 Edema, unspecified: Secondary | ICD-10-CM | POA: Diagnosis not present

## 2014-12-30 DIAGNOSIS — K219 Gastro-esophageal reflux disease without esophagitis: Secondary | ICD-10-CM | POA: Diagnosis not present

## 2014-12-30 DIAGNOSIS — J3089 Other allergic rhinitis: Secondary | ICD-10-CM | POA: Diagnosis not present

## 2014-12-30 DIAGNOSIS — J449 Chronic obstructive pulmonary disease, unspecified: Secondary | ICD-10-CM | POA: Diagnosis not present

## 2014-12-30 DIAGNOSIS — J301 Allergic rhinitis due to pollen: Secondary | ICD-10-CM | POA: Diagnosis not present

## 2014-12-31 DIAGNOSIS — L659 Nonscarring hair loss, unspecified: Secondary | ICD-10-CM | POA: Diagnosis not present

## 2014-12-31 DIAGNOSIS — E034 Atrophy of thyroid (acquired): Secondary | ICD-10-CM | POA: Diagnosis not present

## 2015-01-02 DIAGNOSIS — J301 Allergic rhinitis due to pollen: Secondary | ICD-10-CM | POA: Diagnosis not present

## 2015-01-02 DIAGNOSIS — J3089 Other allergic rhinitis: Secondary | ICD-10-CM | POA: Diagnosis not present

## 2015-01-13 DIAGNOSIS — J3089 Other allergic rhinitis: Secondary | ICD-10-CM | POA: Diagnosis not present

## 2015-01-13 DIAGNOSIS — J301 Allergic rhinitis due to pollen: Secondary | ICD-10-CM | POA: Diagnosis not present

## 2015-01-15 DIAGNOSIS — J3089 Other allergic rhinitis: Secondary | ICD-10-CM | POA: Diagnosis not present

## 2015-01-15 DIAGNOSIS — J301 Allergic rhinitis due to pollen: Secondary | ICD-10-CM | POA: Diagnosis not present

## 2015-01-20 DIAGNOSIS — J301 Allergic rhinitis due to pollen: Secondary | ICD-10-CM | POA: Diagnosis not present

## 2015-01-20 DIAGNOSIS — J3089 Other allergic rhinitis: Secondary | ICD-10-CM | POA: Diagnosis not present

## 2015-02-03 DIAGNOSIS — J3089 Other allergic rhinitis: Secondary | ICD-10-CM | POA: Diagnosis not present

## 2015-02-03 DIAGNOSIS — J301 Allergic rhinitis due to pollen: Secondary | ICD-10-CM | POA: Diagnosis not present

## 2015-02-10 DIAGNOSIS — J301 Allergic rhinitis due to pollen: Secondary | ICD-10-CM | POA: Diagnosis not present

## 2015-02-10 DIAGNOSIS — J3089 Other allergic rhinitis: Secondary | ICD-10-CM | POA: Diagnosis not present

## 2015-02-11 DIAGNOSIS — M5136 Other intervertebral disc degeneration, lumbar region: Secondary | ICD-10-CM | POA: Diagnosis not present

## 2015-02-11 DIAGNOSIS — M5416 Radiculopathy, lumbar region: Secondary | ICD-10-CM | POA: Diagnosis not present

## 2015-02-17 DIAGNOSIS — J301 Allergic rhinitis due to pollen: Secondary | ICD-10-CM | POA: Diagnosis not present

## 2015-02-17 DIAGNOSIS — J3089 Other allergic rhinitis: Secondary | ICD-10-CM | POA: Diagnosis not present

## 2015-02-24 DIAGNOSIS — J3081 Allergic rhinitis due to animal (cat) (dog) hair and dander: Secondary | ICD-10-CM | POA: Diagnosis not present

## 2015-02-24 DIAGNOSIS — J3089 Other allergic rhinitis: Secondary | ICD-10-CM | POA: Diagnosis not present

## 2015-02-24 DIAGNOSIS — J301 Allergic rhinitis due to pollen: Secondary | ICD-10-CM | POA: Diagnosis not present

## 2015-03-03 DIAGNOSIS — J301 Allergic rhinitis due to pollen: Secondary | ICD-10-CM | POA: Diagnosis not present

## 2015-03-03 DIAGNOSIS — J3089 Other allergic rhinitis: Secondary | ICD-10-CM | POA: Diagnosis not present

## 2015-03-10 DIAGNOSIS — J301 Allergic rhinitis due to pollen: Secondary | ICD-10-CM | POA: Diagnosis not present

## 2015-03-10 DIAGNOSIS — J3089 Other allergic rhinitis: Secondary | ICD-10-CM | POA: Diagnosis not present

## 2015-03-17 DIAGNOSIS — J3089 Other allergic rhinitis: Secondary | ICD-10-CM | POA: Diagnosis not present

## 2015-03-17 DIAGNOSIS — J301 Allergic rhinitis due to pollen: Secondary | ICD-10-CM | POA: Diagnosis not present

## 2015-03-24 DIAGNOSIS — J301 Allergic rhinitis due to pollen: Secondary | ICD-10-CM | POA: Diagnosis not present

## 2015-03-24 DIAGNOSIS — J3089 Other allergic rhinitis: Secondary | ICD-10-CM | POA: Diagnosis not present

## 2015-03-31 DIAGNOSIS — J3089 Other allergic rhinitis: Secondary | ICD-10-CM | POA: Diagnosis not present

## 2015-03-31 DIAGNOSIS — J301 Allergic rhinitis due to pollen: Secondary | ICD-10-CM | POA: Diagnosis not present

## 2015-04-09 DIAGNOSIS — J301 Allergic rhinitis due to pollen: Secondary | ICD-10-CM | POA: Diagnosis not present

## 2015-04-09 DIAGNOSIS — J3089 Other allergic rhinitis: Secondary | ICD-10-CM | POA: Diagnosis not present

## 2015-04-14 DIAGNOSIS — J3089 Other allergic rhinitis: Secondary | ICD-10-CM | POA: Diagnosis not present

## 2015-04-14 DIAGNOSIS — J301 Allergic rhinitis due to pollen: Secondary | ICD-10-CM | POA: Diagnosis not present

## 2015-04-28 DIAGNOSIS — J301 Allergic rhinitis due to pollen: Secondary | ICD-10-CM | POA: Diagnosis not present

## 2015-04-28 DIAGNOSIS — J3089 Other allergic rhinitis: Secondary | ICD-10-CM | POA: Diagnosis not present

## 2015-04-28 DIAGNOSIS — J3081 Allergic rhinitis due to animal (cat) (dog) hair and dander: Secondary | ICD-10-CM | POA: Diagnosis not present

## 2015-05-05 DIAGNOSIS — J3089 Other allergic rhinitis: Secondary | ICD-10-CM | POA: Diagnosis not present

## 2015-05-05 DIAGNOSIS — J301 Allergic rhinitis due to pollen: Secondary | ICD-10-CM | POA: Diagnosis not present

## 2015-05-13 DIAGNOSIS — Z23 Encounter for immunization: Secondary | ICD-10-CM | POA: Diagnosis not present

## 2015-05-14 DIAGNOSIS — J301 Allergic rhinitis due to pollen: Secondary | ICD-10-CM | POA: Diagnosis not present

## 2015-05-14 DIAGNOSIS — J3089 Other allergic rhinitis: Secondary | ICD-10-CM | POA: Diagnosis not present

## 2015-05-19 DIAGNOSIS — J301 Allergic rhinitis due to pollen: Secondary | ICD-10-CM | POA: Diagnosis not present

## 2015-05-19 DIAGNOSIS — J3089 Other allergic rhinitis: Secondary | ICD-10-CM | POA: Diagnosis not present

## 2015-05-20 DIAGNOSIS — M5136 Other intervertebral disc degeneration, lumbar region: Secondary | ICD-10-CM | POA: Diagnosis not present

## 2015-05-20 DIAGNOSIS — M5416 Radiculopathy, lumbar region: Secondary | ICD-10-CM | POA: Diagnosis not present

## 2015-05-26 DIAGNOSIS — Z0000 Encounter for general adult medical examination without abnormal findings: Secondary | ICD-10-CM | POA: Diagnosis not present

## 2015-05-26 DIAGNOSIS — Z1211 Encounter for screening for malignant neoplasm of colon: Secondary | ICD-10-CM | POA: Diagnosis not present

## 2015-05-28 DIAGNOSIS — J301 Allergic rhinitis due to pollen: Secondary | ICD-10-CM | POA: Diagnosis not present

## 2015-05-28 DIAGNOSIS — J3089 Other allergic rhinitis: Secondary | ICD-10-CM | POA: Diagnosis not present

## 2015-05-29 DIAGNOSIS — Z01419 Encounter for gynecological examination (general) (routine) without abnormal findings: Secondary | ICD-10-CM | POA: Diagnosis not present

## 2015-05-29 DIAGNOSIS — Z113 Encounter for screening for infections with a predominantly sexual mode of transmission: Secondary | ICD-10-CM | POA: Diagnosis not present

## 2015-05-29 DIAGNOSIS — Z0000 Encounter for general adult medical examination without abnormal findings: Secondary | ICD-10-CM | POA: Diagnosis not present

## 2015-06-02 DIAGNOSIS — J301 Allergic rhinitis due to pollen: Secondary | ICD-10-CM | POA: Diagnosis not present

## 2015-06-02 DIAGNOSIS — J3089 Other allergic rhinitis: Secondary | ICD-10-CM | POA: Diagnosis not present

## 2015-06-04 DIAGNOSIS — J301 Allergic rhinitis due to pollen: Secondary | ICD-10-CM | POA: Diagnosis not present

## 2015-06-04 DIAGNOSIS — J3089 Other allergic rhinitis: Secondary | ICD-10-CM | POA: Diagnosis not present

## 2015-06-09 DIAGNOSIS — R5381 Other malaise: Secondary | ICD-10-CM | POA: Diagnosis not present

## 2015-06-09 DIAGNOSIS — K219 Gastro-esophageal reflux disease without esophagitis: Secondary | ICD-10-CM | POA: Diagnosis not present

## 2015-06-09 DIAGNOSIS — E669 Obesity, unspecified: Secondary | ICD-10-CM | POA: Diagnosis not present

## 2015-06-09 DIAGNOSIS — J3089 Other allergic rhinitis: Secondary | ICD-10-CM | POA: Diagnosis not present

## 2015-06-09 DIAGNOSIS — I10 Essential (primary) hypertension: Secondary | ICD-10-CM | POA: Diagnosis not present

## 2015-06-09 DIAGNOSIS — E038 Other specified hypothyroidism: Secondary | ICD-10-CM | POA: Diagnosis not present

## 2015-06-09 DIAGNOSIS — J301 Allergic rhinitis due to pollen: Secondary | ICD-10-CM | POA: Diagnosis not present

## 2015-06-09 DIAGNOSIS — R079 Chest pain, unspecified: Secondary | ICD-10-CM | POA: Diagnosis not present

## 2015-06-09 DIAGNOSIS — J449 Chronic obstructive pulmonary disease, unspecified: Secondary | ICD-10-CM | POA: Diagnosis not present

## 2015-06-15 DIAGNOSIS — M5416 Radiculopathy, lumbar region: Secondary | ICD-10-CM | POA: Diagnosis not present

## 2015-06-15 DIAGNOSIS — M5136 Other intervertebral disc degeneration, lumbar region: Secondary | ICD-10-CM | POA: Diagnosis not present

## 2015-06-16 DIAGNOSIS — J3089 Other allergic rhinitis: Secondary | ICD-10-CM | POA: Diagnosis not present

## 2015-06-16 DIAGNOSIS — J301 Allergic rhinitis due to pollen: Secondary | ICD-10-CM | POA: Diagnosis not present

## 2015-06-23 DIAGNOSIS — J449 Chronic obstructive pulmonary disease, unspecified: Secondary | ICD-10-CM | POA: Diagnosis not present

## 2015-06-23 DIAGNOSIS — J3089 Other allergic rhinitis: Secondary | ICD-10-CM | POA: Diagnosis not present

## 2015-06-23 DIAGNOSIS — I10 Essential (primary) hypertension: Secondary | ICD-10-CM | POA: Diagnosis not present

## 2015-06-23 DIAGNOSIS — J301 Allergic rhinitis due to pollen: Secondary | ICD-10-CM | POA: Diagnosis not present

## 2015-06-23 DIAGNOSIS — J329 Chronic sinusitis, unspecified: Secondary | ICD-10-CM | POA: Diagnosis not present

## 2015-06-23 DIAGNOSIS — K219 Gastro-esophageal reflux disease without esophagitis: Secondary | ICD-10-CM | POA: Diagnosis not present

## 2015-06-30 DIAGNOSIS — J301 Allergic rhinitis due to pollen: Secondary | ICD-10-CM | POA: Diagnosis not present

## 2015-06-30 DIAGNOSIS — J3089 Other allergic rhinitis: Secondary | ICD-10-CM | POA: Diagnosis not present

## 2015-07-02 DIAGNOSIS — J301 Allergic rhinitis due to pollen: Secondary | ICD-10-CM | POA: Diagnosis not present

## 2015-07-02 DIAGNOSIS — J3089 Other allergic rhinitis: Secondary | ICD-10-CM | POA: Diagnosis not present

## 2015-07-07 DIAGNOSIS — J301 Allergic rhinitis due to pollen: Secondary | ICD-10-CM | POA: Diagnosis not present

## 2015-07-07 DIAGNOSIS — J3089 Other allergic rhinitis: Secondary | ICD-10-CM | POA: Diagnosis not present

## 2015-07-17 DIAGNOSIS — J209 Acute bronchitis, unspecified: Secondary | ICD-10-CM | POA: Diagnosis not present

## 2015-07-17 DIAGNOSIS — J4540 Moderate persistent asthma, uncomplicated: Secondary | ICD-10-CM | POA: Diagnosis not present

## 2015-07-17 DIAGNOSIS — J301 Allergic rhinitis due to pollen: Secondary | ICD-10-CM | POA: Diagnosis not present

## 2015-07-17 DIAGNOSIS — J3089 Other allergic rhinitis: Secondary | ICD-10-CM | POA: Diagnosis not present

## 2015-07-28 DIAGNOSIS — J301 Allergic rhinitis due to pollen: Secondary | ICD-10-CM | POA: Diagnosis not present

## 2015-07-28 DIAGNOSIS — J3089 Other allergic rhinitis: Secondary | ICD-10-CM | POA: Diagnosis not present

## 2015-08-04 DIAGNOSIS — J301 Allergic rhinitis due to pollen: Secondary | ICD-10-CM | POA: Diagnosis not present

## 2015-08-04 DIAGNOSIS — J3089 Other allergic rhinitis: Secondary | ICD-10-CM | POA: Diagnosis not present

## 2015-08-11 DIAGNOSIS — J3089 Other allergic rhinitis: Secondary | ICD-10-CM | POA: Diagnosis not present

## 2015-08-11 DIAGNOSIS — J301 Allergic rhinitis due to pollen: Secondary | ICD-10-CM | POA: Diagnosis not present

## 2015-08-18 DIAGNOSIS — J449 Chronic obstructive pulmonary disease, unspecified: Secondary | ICD-10-CM | POA: Diagnosis not present

## 2015-08-18 DIAGNOSIS — J069 Acute upper respiratory infection, unspecified: Secondary | ICD-10-CM | POA: Diagnosis not present

## 2015-08-18 DIAGNOSIS — J311 Chronic nasopharyngitis: Secondary | ICD-10-CM | POA: Diagnosis not present

## 2015-08-18 DIAGNOSIS — R599 Enlarged lymph nodes, unspecified: Secondary | ICD-10-CM | POA: Diagnosis not present

## 2015-08-19 DIAGNOSIS — M5416 Radiculopathy, lumbar region: Secondary | ICD-10-CM | POA: Diagnosis not present

## 2015-08-19 DIAGNOSIS — E034 Atrophy of thyroid (acquired): Secondary | ICD-10-CM | POA: Diagnosis not present

## 2015-08-19 DIAGNOSIS — M5136 Other intervertebral disc degeneration, lumbar region: Secondary | ICD-10-CM | POA: Diagnosis not present

## 2015-08-20 DIAGNOSIS — J3089 Other allergic rhinitis: Secondary | ICD-10-CM | POA: Diagnosis not present

## 2015-08-20 DIAGNOSIS — J301 Allergic rhinitis due to pollen: Secondary | ICD-10-CM | POA: Diagnosis not present

## 2015-08-25 DIAGNOSIS — J449 Chronic obstructive pulmonary disease, unspecified: Secondary | ICD-10-CM | POA: Diagnosis not present

## 2015-08-25 DIAGNOSIS — J0100 Acute maxillary sinusitis, unspecified: Secondary | ICD-10-CM | POA: Diagnosis not present

## 2015-08-27 DIAGNOSIS — J4540 Moderate persistent asthma, uncomplicated: Secondary | ICD-10-CM | POA: Diagnosis not present

## 2015-08-27 DIAGNOSIS — J3089 Other allergic rhinitis: Secondary | ICD-10-CM | POA: Diagnosis not present

## 2015-08-27 DIAGNOSIS — J301 Allergic rhinitis due to pollen: Secondary | ICD-10-CM | POA: Diagnosis not present

## 2015-09-01 DIAGNOSIS — J3089 Other allergic rhinitis: Secondary | ICD-10-CM | POA: Diagnosis not present

## 2015-09-01 DIAGNOSIS — J301 Allergic rhinitis due to pollen: Secondary | ICD-10-CM | POA: Diagnosis not present

## 2015-09-08 DIAGNOSIS — J3089 Other allergic rhinitis: Secondary | ICD-10-CM | POA: Diagnosis not present

## 2015-09-08 DIAGNOSIS — J301 Allergic rhinitis due to pollen: Secondary | ICD-10-CM | POA: Diagnosis not present

## 2015-09-15 DIAGNOSIS — J3089 Other allergic rhinitis: Secondary | ICD-10-CM | POA: Diagnosis not present

## 2015-09-15 DIAGNOSIS — J301 Allergic rhinitis due to pollen: Secondary | ICD-10-CM | POA: Diagnosis not present

## 2015-09-22 DIAGNOSIS — J301 Allergic rhinitis due to pollen: Secondary | ICD-10-CM | POA: Diagnosis not present

## 2015-09-22 DIAGNOSIS — J3089 Other allergic rhinitis: Secondary | ICD-10-CM | POA: Diagnosis not present

## 2015-10-01 DIAGNOSIS — J301 Allergic rhinitis due to pollen: Secondary | ICD-10-CM | POA: Diagnosis not present

## 2015-10-01 DIAGNOSIS — J3089 Other allergic rhinitis: Secondary | ICD-10-CM | POA: Diagnosis not present

## 2015-10-06 DIAGNOSIS — J301 Allergic rhinitis due to pollen: Secondary | ICD-10-CM | POA: Diagnosis not present

## 2015-10-06 DIAGNOSIS — N63 Unspecified lump in breast: Secondary | ICD-10-CM | POA: Diagnosis not present

## 2015-10-06 DIAGNOSIS — J3089 Other allergic rhinitis: Secondary | ICD-10-CM | POA: Diagnosis not present

## 2015-10-06 DIAGNOSIS — R922 Inconclusive mammogram: Secondary | ICD-10-CM | POA: Diagnosis not present

## 2015-10-06 DIAGNOSIS — R928 Other abnormal and inconclusive findings on diagnostic imaging of breast: Secondary | ICD-10-CM | POA: Diagnosis not present

## 2015-10-12 DIAGNOSIS — Z85850 Personal history of malignant neoplasm of thyroid: Secondary | ICD-10-CM | POA: Diagnosis not present

## 2015-10-12 DIAGNOSIS — Z9109 Other allergy status, other than to drugs and biological substances: Secondary | ICD-10-CM | POA: Diagnosis not present

## 2015-10-12 DIAGNOSIS — Z79899 Other long term (current) drug therapy: Secondary | ICD-10-CM | POA: Diagnosis not present

## 2015-10-12 DIAGNOSIS — Z9289 Personal history of other medical treatment: Secondary | ICD-10-CM | POA: Diagnosis not present

## 2015-10-12 DIAGNOSIS — Z6833 Body mass index (BMI) 33.0-33.9, adult: Secondary | ICD-10-CM | POA: Diagnosis not present

## 2015-10-12 DIAGNOSIS — J45909 Unspecified asthma, uncomplicated: Secondary | ICD-10-CM | POA: Diagnosis not present

## 2015-10-12 DIAGNOSIS — I10 Essential (primary) hypertension: Secondary | ICD-10-CM | POA: Diagnosis not present

## 2015-10-12 DIAGNOSIS — Z880 Allergy status to penicillin: Secondary | ICD-10-CM | POA: Diagnosis not present

## 2015-10-12 DIAGNOSIS — R928 Other abnormal and inconclusive findings on diagnostic imaging of breast: Secondary | ICD-10-CM | POA: Diagnosis not present

## 2015-10-12 DIAGNOSIS — N62 Hypertrophy of breast: Secondary | ICD-10-CM | POA: Diagnosis not present

## 2015-10-12 DIAGNOSIS — N6031 Fibrosclerosis of right breast: Secondary | ICD-10-CM | POA: Diagnosis not present

## 2015-10-12 DIAGNOSIS — M1990 Unspecified osteoarthritis, unspecified site: Secondary | ICD-10-CM | POA: Diagnosis not present

## 2015-10-12 DIAGNOSIS — N63 Unspecified lump in breast: Secondary | ICD-10-CM | POA: Diagnosis not present

## 2015-10-13 DIAGNOSIS — J301 Allergic rhinitis due to pollen: Secondary | ICD-10-CM | POA: Diagnosis not present

## 2015-10-13 DIAGNOSIS — J3089 Other allergic rhinitis: Secondary | ICD-10-CM | POA: Diagnosis not present

## 2015-10-20 DIAGNOSIS — J301 Allergic rhinitis due to pollen: Secondary | ICD-10-CM | POA: Diagnosis not present

## 2015-10-20 DIAGNOSIS — R309 Painful micturition, unspecified: Secondary | ICD-10-CM | POA: Diagnosis not present

## 2015-10-20 DIAGNOSIS — K219 Gastro-esophageal reflux disease without esophagitis: Secondary | ICD-10-CM | POA: Diagnosis not present

## 2015-10-20 DIAGNOSIS — N63 Unspecified lump in breast: Secondary | ICD-10-CM | POA: Diagnosis not present

## 2015-10-20 DIAGNOSIS — J3089 Other allergic rhinitis: Secondary | ICD-10-CM | POA: Diagnosis not present

## 2015-10-23 DIAGNOSIS — Z6833 Body mass index (BMI) 33.0-33.9, adult: Secondary | ICD-10-CM | POA: Diagnosis not present

## 2015-10-23 DIAGNOSIS — R928 Other abnormal and inconclusive findings on diagnostic imaging of breast: Secondary | ICD-10-CM | POA: Diagnosis not present

## 2015-10-23 DIAGNOSIS — N63 Unspecified lump in breast: Secondary | ICD-10-CM | POA: Diagnosis not present

## 2015-10-24 HISTORY — PX: BREAST SURGERY: SHX581

## 2015-10-27 DIAGNOSIS — J3089 Other allergic rhinitis: Secondary | ICD-10-CM | POA: Diagnosis not present

## 2015-10-27 DIAGNOSIS — J301 Allergic rhinitis due to pollen: Secondary | ICD-10-CM | POA: Diagnosis not present

## 2015-10-29 DIAGNOSIS — N63 Unspecified lump in breast: Secondary | ICD-10-CM | POA: Diagnosis not present

## 2015-10-29 DIAGNOSIS — D241 Benign neoplasm of right breast: Secondary | ICD-10-CM | POA: Diagnosis not present

## 2015-10-29 DIAGNOSIS — Z880 Allergy status to penicillin: Secondary | ICD-10-CM | POA: Diagnosis not present

## 2015-10-29 DIAGNOSIS — J45909 Unspecified asthma, uncomplicated: Secondary | ICD-10-CM | POA: Diagnosis not present

## 2015-10-29 DIAGNOSIS — E039 Hypothyroidism, unspecified: Secondary | ICD-10-CM | POA: Diagnosis not present

## 2015-10-29 DIAGNOSIS — Z6833 Body mass index (BMI) 33.0-33.9, adult: Secondary | ICD-10-CM | POA: Diagnosis not present

## 2015-10-29 DIAGNOSIS — N62 Hypertrophy of breast: Secondary | ICD-10-CM | POA: Diagnosis not present

## 2015-10-29 DIAGNOSIS — E669 Obesity, unspecified: Secondary | ICD-10-CM | POA: Diagnosis not present

## 2015-10-29 DIAGNOSIS — D0501 Lobular carcinoma in situ of right breast: Secondary | ICD-10-CM | POA: Diagnosis not present

## 2015-10-29 DIAGNOSIS — Z85850 Personal history of malignant neoplasm of thyroid: Secondary | ICD-10-CM | POA: Diagnosis not present

## 2015-10-29 DIAGNOSIS — M1990 Unspecified osteoarthritis, unspecified site: Secondary | ICD-10-CM | POA: Diagnosis not present

## 2015-10-29 DIAGNOSIS — Z79899 Other long term (current) drug therapy: Secondary | ICD-10-CM | POA: Diagnosis not present

## 2015-10-29 DIAGNOSIS — I10 Essential (primary) hypertension: Secondary | ICD-10-CM | POA: Diagnosis not present

## 2015-10-29 DIAGNOSIS — N6091 Unspecified benign mammary dysplasia of right breast: Secondary | ICD-10-CM | POA: Diagnosis not present

## 2015-11-03 DIAGNOSIS — J301 Allergic rhinitis due to pollen: Secondary | ICD-10-CM | POA: Diagnosis not present

## 2015-11-03 DIAGNOSIS — J3089 Other allergic rhinitis: Secondary | ICD-10-CM | POA: Diagnosis not present

## 2015-11-06 DIAGNOSIS — D0501 Lobular carcinoma in situ of right breast: Secondary | ICD-10-CM | POA: Diagnosis not present

## 2015-11-11 DIAGNOSIS — M5416 Radiculopathy, lumbar region: Secondary | ICD-10-CM | POA: Diagnosis not present

## 2015-11-11 DIAGNOSIS — M5136 Other intervertebral disc degeneration, lumbar region: Secondary | ICD-10-CM | POA: Diagnosis not present

## 2015-11-12 ENCOUNTER — Other Ambulatory Visit: Payer: Self-pay | Admitting: Internal Medicine

## 2015-11-12 DIAGNOSIS — I10 Essential (primary) hypertension: Secondary | ICD-10-CM | POA: Diagnosis not present

## 2015-11-12 DIAGNOSIS — D5701 Hb-SS disease with acute chest syndrome: Secondary | ICD-10-CM | POA: Diagnosis not present

## 2015-11-12 DIAGNOSIS — R5381 Other malaise: Secondary | ICD-10-CM | POA: Diagnosis not present

## 2015-11-12 DIAGNOSIS — R079 Chest pain, unspecified: Secondary | ICD-10-CM | POA: Diagnosis not present

## 2015-11-12 DIAGNOSIS — K851 Biliary acute pancreatitis without necrosis or infection: Secondary | ICD-10-CM

## 2015-11-12 DIAGNOSIS — E034 Atrophy of thyroid (acquired): Secondary | ICD-10-CM | POA: Diagnosis not present

## 2015-11-16 ENCOUNTER — Ambulatory Visit
Admission: RE | Admit: 2015-11-16 | Discharge: 2015-11-16 | Disposition: A | Discharge status: Home / Self Care | Payer: Medicare Other | Source: Ambulatory Visit | Attending: Internal Medicine | Admitting: Internal Medicine

## 2015-11-16 DIAGNOSIS — K851 Biliary acute pancreatitis without necrosis or infection: Secondary | ICD-10-CM | POA: Diagnosis not present

## 2015-11-16 DIAGNOSIS — K8020 Calculus of gallbladder without cholecystitis without obstruction: Secondary | ICD-10-CM | POA: Insufficient documentation

## 2015-11-17 DIAGNOSIS — J3089 Other allergic rhinitis: Secondary | ICD-10-CM | POA: Diagnosis not present

## 2015-11-17 DIAGNOSIS — J301 Allergic rhinitis due to pollen: Secondary | ICD-10-CM | POA: Diagnosis not present

## 2015-11-18 DIAGNOSIS — D5701 Hb-SS disease with acute chest syndrome: Secondary | ICD-10-CM | POA: Diagnosis not present

## 2015-11-18 DIAGNOSIS — K219 Gastro-esophageal reflux disease without esophagitis: Secondary | ICD-10-CM | POA: Diagnosis not present

## 2015-11-24 DIAGNOSIS — J3089 Other allergic rhinitis: Secondary | ICD-10-CM | POA: Diagnosis not present

## 2015-11-24 DIAGNOSIS — J301 Allergic rhinitis due to pollen: Secondary | ICD-10-CM | POA: Diagnosis not present

## 2015-11-25 ENCOUNTER — Ambulatory Visit (INDEPENDENT_AMBULATORY_CARE_PROVIDER_SITE_OTHER): Payer: Medicare Other | Admitting: General Surgery

## 2015-11-25 ENCOUNTER — Encounter: Payer: Self-pay | Admitting: General Surgery

## 2015-11-25 ENCOUNTER — Ambulatory Visit: Payer: Medicare Other | Admitting: General Surgery

## 2015-11-25 VITALS — BP 138/78 | HR 66 | Resp 14 | Ht 61.0 in | Wt 165.0 lb

## 2015-11-25 DIAGNOSIS — K8020 Calculus of gallbladder without cholecystitis without obstruction: Secondary | ICD-10-CM | POA: Diagnosis not present

## 2015-11-25 DIAGNOSIS — K810 Acute cholecystitis: Secondary | ICD-10-CM

## 2015-11-25 NOTE — Progress Notes (Signed)
Patient ID: [name], female   DOB: Dec 10, 1945, 70 y.o.   MRN: [id number]  Chief Complaint  Patient presents with  . Other    gall bladder    HPI [name] is a 70 y.o. female.  Here today for evaluation of her gall bladder. She states she was having upper abdominal pain associated with nausea. The pain goes around to her back. She states this has been going on for a awhile but worse since Easter weekend. Patient states that the episode Easter weekend lasted 2 hours and associated fever and chills. She stated she vomited that weekend and since she has been doing a bland diet and no further vomiting. Ultrasound was 11-16-15. Also about 1 mo ago she had an abnormal mammogra, core biopsy and excision done-LCIS and ALH. Pt is now on Evista  I have reviewed the history of present illness with the patient. HPI  Past Medical History  Diagnosis Date  . Diverticulosis   . Asthma   . Thyroid disease   . Bulging lumbar disc   . Heart murmur   . DDD (degenerative disc disease), lumbar   . Cancer [facility]) 1999    thyroid with recurrent 2006  . Interstitial cystitis     Past Surgical History  Procedure Laterality Date  . Tonsillectomy  1952  . Excisional hemorrhoidectomy  1975  . Eye surgery  2011  . Thyroidectomy  1999  . Bladder surgery  1984  . Colonoscopy  2005, 2015  . Hemorrhoid surgery  1975  . Hernia repair  2015  . Cataract extraction  2011  . Dilation and curettage of uterus  1975  . Breast surgery  [id number]    mass removed  . Breast surgery  April 2017    Dr Jack Quarto [facility]    Family History  Problem Relation Age of Onset  . Heart disease Mother   . Cancer Father     colon  . Heart disease Brother     Social History Social History  Substance Use Topics  . Smoking status: Former Smoker -- 15 years  . Smokeless tobacco: Never Used  . Alcohol Use: No    Allergies  Allergen Reactions  . Penicillins Hives    Current Outpatient Prescriptions   Medication Sig Dispense Refill  . ALBUTEROL IN Inhale into the lungs as needed.    Marland Kitchen aspirin 81 MG tablet Take 81 mg by mouth daily.    . celecoxib (CELEBREX) 100 MG capsule Take 200 mg by mouth daily.    . cetirizine (ZYRTEC) 10 MG tablet Take 10 mg by mouth daily.    . Cholecalciferol (VITAMIN D-3) 1000 units CAPS Take by mouth daily.    . clonazePAM (KLONOPIN) 0.5 MG tablet Take 0.5 mg by mouth 2 (two) times daily as needed for anxiety.    . cycloSPORINE (RESTASIS) 0.05 % ophthalmic emulsion 1 drop 2 (two) times daily.    . Flaxseed, Linseed, (FLAXSEED OIL PO) Take by mouth.    . fluticasone (FLONASE) 50 MCG/ACT nasal spray Place 1 spray into both nostrils daily.    . Fluticasone-Salmeterol (ADVAIR) 250-50 MCG/DOSE AEPB Inhale 1 puff into the lungs 3 (three) times daily.    . furosemide (LASIX) 20 MG tablet Take 40 mg by mouth daily.     Marland Kitchen gabapentin (NEURONTIN) 300 MG capsule Take 300 mg by mouth 2 (two) times daily.     Marland Kitchen HYDROcodone-acetaminophen (NORCO) 7.5-325 MG tablet   0  . levothyroxine (SYNTHROID, LEVOTHROID)  150 MCG tablet Take 125 mcg by mouth daily before breakfast.     . pantoprazole (PROTONIX) 40 MG tablet Take 1 tablet by mouth daily.    . Phenazopyridine HCl (AZO TABS PO) Take by mouth.    . raloxifene (EVISTA) 60 MG tablet Take 60 mg by mouth daily.    Marland Kitchen tiZANidine (ZANAFLEX) 4 MG capsule Take 4 mg by mouth 2 (two) times daily.    Marland Kitchen zolpidem (AMBIEN) 10 MG tablet Take 10 mg by mouth at bedtime as needed for sleep.     No current facility-administered medications for this visit.    Review of Systems Review of Systems  Constitutional: Negative.   Respiratory: Negative.   Cardiovascular: Negative.   Gastrointestinal: Positive for nausea. Negative for diarrhea and constipation.    Blood pressure 138/78, pulse 66, resp. rate 14, height 5\' 1"  (1.549 m), weight 165 lb (74.844 kg).  Physical Exam Physical Exam  Constitutional: She is oriented to person, place, and  time. Vital signs are normal. She appears well-developed and well-nourished.  Eyes: No scleral icterus.  Neck: Neck supple.  Cardiovascular: Normal rate and regular rhythm.   Edema in lower extremities   Pulmonary/Chest: Effort normal and breath sounds normal.  Abdominal: Soft. Bowel sounds are normal. There is no hepatomegaly. There is no tenderness.  Lymphadenopathy:    She has no cervical adenopathy.    She has no axillary adenopathy.  Neurological: She is alert and oriented to person, place, and time.  Skin: Skin is warm and dry.    Data Reviewed Ultrasound and previous notes reviewed. Has gallstones. Labs were normal  Assessment     Patient had a recent episode of what was most likely biliary colic. Recommended cholecystectomy and she is agreeable.     Plan   Patient to have laparoscopic cholecystectomy.  Laparoscopic Cholecystectomy with Intraoperative Cholangiogram. The procedure, including it's potential risks and complications (including but not limited to infection, bleeding, injury to intra-abdominal organs or bile ducts, bile leak, poor cosmetic result, sepsis and death) were discussed with the patient in detail. Non-operative options, including their inherent risks (acute calculous cholecystitis with possible choledocholithiasis or gallstone pancreatitis, with the risk of ascending cholangitis, sepsis, and death) were discussed as well. The patient expressed and understanding of what we discussed and wishes to proceed with laparoscopic cholecystectomy. The patient further understands that if it is technically not possible, or it is unsafe to proceed laparoscopically, that I will convert to an open cholecystectomy.    Patient's surgery has been scheduled for 11-30-15 at [facility]. It is okay for patient to continue 81 mg aspirin once daily.   PCP:  [name] This information has been scribed by Karie Fetch RN, BSN,BC.   Nattalie Santiesteban G 11/25/2015, 5:00 PM

## 2015-11-25 NOTE — Patient Instructions (Addendum)
The patient is aware to call back for any questions or concerns. Laparoscopic Cholecystectomy Laparoscopic cholecystectomy is surgery to remove the gallbladder. The gallbladder is located in the upper right part of the abdomen, behind the liver. It is a storage sac for bile, which is produced in the liver. Bile aids in the digestion and absorption of fats. Cholecystectomy is often done for inflammation of the gallbladder (cholecystitis). This condition is usually caused by a buildup of gallstones (cholelithiasis) in the gallbladder. Gallstones can block the flow of bile, and that can result in inflammation and pain. In severe cases, emergency surgery may be required. If emergency surgery is not required, you will have time to prepare for the procedure. Laparoscopic surgery is an alternative to open surgery. Laparoscopic surgery has a shorter recovery time. Your common bile duct may also need to be examined during the procedure. If stones are found in the common bile duct, they may be removed. LET [facility] CARE PROVIDER KNOW ABOUT:  Any allergies you have.  All medicines you are taking, including vitamins, herbs, eye drops, creams, and over-the-counter medicines.  Previous problems you or members of your family have had with the use of anesthetics.  Any blood disorders you have.  Previous surgeries you have had.  Any medical conditions you have. RISKS AND COMPLICATIONS Generally, this is a safe procedure. However, problems may occur, including:  Infection.  Bleeding.  Allergic reactions to medicines.  Damage to other structures or organs.  A stone remaining in the common bile duct.  A bile leak from the cyst duct that is clipped when your gallbladder is removed.  The need to convert to open surgery, which requires a larger incision in the abdomen. This may be necessary if your surgeon thinks that it is not safe to continue with a laparoscopic procedure. BEFORE THE PROCEDURE  Ask  your health care provider about:  Changing or stopping your regular medicines. This is especially important if you are taking diabetes medicines or blood thinners.  Taking medicines such as aspirin and ibuprofen. These medicines can thin your blood. Do not take these medicines before your procedure if your health care provider instructs you not to.  Follow instructions from your health care provider about eating or drinking restrictions.  Let your health care provider know if you develop a cold or an infection before surgery.  Plan to have someone take you home after the procedure.  Ask your health care provider how your surgical site will be marked or identified.  You may be given antibiotic medicine to help prevent infection. PROCEDURE  To reduce your risk of infection:  Your health care team will wash or sanitize their hands.  Your skin will be washed with soap.  An IV tube may be inserted into one of your veins.  You will be given a medicine to make you fall asleep (general anesthetic).  A breathing tube will be placed in your mouth.  The surgeon will make several small cuts (incisions) in your abdomen.  A thin, lighted tube (laparoscope) that has a tiny camera on the end will be inserted through one of the small incisions. The camera on the laparoscope will send a picture to a TV screen (monitor) in the operating room. This will give the surgeon a good view inside your abdomen.  A gas will be pumped into your abdomen. This will expand your abdomen to give the surgeon more room to perform the surgery.  Other tools that are needed for  the procedure will be inserted through the other incisions. The gallbladder will be removed through one of the incisions.  After your gallbladder has been removed, the incisions will be closed with stitches (sutures), staples, or skin glue.  Your incisions may be covered with a bandage (dressing). The procedure may vary among health care  providers and hospitals. AFTER THE PROCEDURE  Your blood pressure, heart rate, breathing rate, and blood oxygen level will be monitored often until the medicines you were given have worn off.  You will be given medicines as needed to control your pain.   This information is not intended to replace advice given to you by your health care provider. Make sure you discuss any questions you have with your health care provider.   Document Released: 07/11/2005 Document Revised: 04/01/2015 Document Reviewed: 02/20/2013 Elsevier Interactive Patient Education 2016 [place].  Patient's surgery has been scheduled for 11-30-15 at [facility].  It is okay for patient to continue 81 mg aspirin once daily.

## 2015-11-26 ENCOUNTER — Other Ambulatory Visit: Payer: Self-pay | Admitting: *Deleted

## 2015-11-26 ENCOUNTER — Inpatient Hospital Stay: Admission: RE | Admit: 2015-11-26 | Payer: Medicare Other | Source: Ambulatory Visit

## 2015-11-26 ENCOUNTER — Encounter: Payer: Self-pay | Admitting: *Deleted

## 2015-11-26 DIAGNOSIS — Z79810 Long term (current) use of selective estrogen receptor modulators (SERMs): Secondary | ICD-10-CM

## 2015-11-26 DIAGNOSIS — N939 Abnormal uterine and vaginal bleeding, unspecified: Secondary | ICD-10-CM

## 2015-11-26 NOTE — Patient Instructions (Signed)
  Your procedure is scheduled on: 11-30-15 Report to [facility] To find out your arrival time please call [phone] between 1PM - 3PM on 11-27-15  Remember: Instructions that are not followed completely may result in serious medical risk, up to and including death, or upon the discretion of your surgeon and anesthesiologist your surgery may need to be rescheduled.    _X___ 1. Do not eat food or drink liquids after midnight. No gum chewing or hard candies.     _X___ 2. No Alcohol for 24 hours before or after surgery.   ____ 3. Bring all medications with you on the day of surgery if instructed.    ____ 4. Notify your doctor if there is any change in your medical condition     (cold, fever, infections).     Do not wear jewelry, make-up, hairpins, clips or nail polish.  Do not wear lotions, powders, or perfumes. You may wear deodorant.  Do not shave 48 hours prior to surgery. Men may shave face and neck.  Do not bring valuables to the hospital.    [facility] is not responsible for any belongings or valuables.               Contacts, dentures or bridgework may not be worn into surgery.  Leave your suitcase in the car. After surgery it may be brought to your room.  For patients admitted to the hospital, discharge time is determined by your treatment team.   Patients discharged the day of surgery will not be allowed to drive home.   Please read over the following fact sheets that you were given:      _X___ Take these medicines the morning of surgery with A SIP OF WATER:    1. LEVOTHYROXINE  2. PROZAC  3. PROTONIX   4. TAKE AN EXTRA PROTONIX ON Sunday NIGHT BEFORE BED  5.  [place] WITH YOU TO [place]   6.  ____ Fleet Enema (as directed)   ____ Use CHG Soap as directed  _X___ Use inhalers on the day of surgery-USE ALBUTEROL INHALER AT [place]  ____ Stop metformin 2 days prior to surgery    ____ Take 1/2 of usual  insulin dose the night before surgery and none on the morning of surgery.   ____ Stop Coumadin/Plavix/aspirin-OK TO CONTINUE 81 MG ASA PER DR SANKAR-DO NOT TAKE AM OF SURGERY   ____ Stop Anti-inflammatories-OK TO CONTINUE CELEBREX-NO NSAIDS OR ASA PRODUCTS-HYDROCODONE OK TO CONTINUE OR TYLENOL   _X___ Stop supplements until after surgery-PT ALREADY STOPPED THIS ON 11-25-15   ____ Bring C-Pap to the hospital.

## 2015-11-26 NOTE — Progress Notes (Signed)
Patient ID: [name], female   DOB: Oct 22, 1945, 70 y.o.   MRN: [id number]  This patient recently had breast surgery through [facility]. They had arranged for a mammogram and follow up appointment with her doctor for October 2017. Patient states that she does not want to return to them for breast follow up. She wishes to have future mammograms completed through [facility] and breast follow up through our office.   Patient has been scheduled for a unilateral right breast diagnostic mammogram at [facility] for 05-03-16 at 11:30 am. Patient aware to call and cancel other appointments originally scheduled through [facility].  An appointment has also been made for patient to see Dr. Enzo Bi at [facility] for 12-24-15 at 11:30 am.   This patient has been notified of the above and verbalizes understanding.

## 2015-11-30 ENCOUNTER — Ambulatory Visit: Payer: Medicare Other | Admitting: Anesthesiology

## 2015-11-30 ENCOUNTER — Encounter
Admission: RE | Disposition: A | Discharge status: Home / Self Care | Payer: Self-pay | Source: Ambulatory Visit | Attending: General Surgery

## 2015-11-30 ENCOUNTER — Ambulatory Visit
Admission: RE | Admit: 2015-11-30 | Discharge: 2015-11-30 | Disposition: A | Discharge status: Home / Self Care | Payer: Medicare Other | Source: Ambulatory Visit | Attending: General Surgery | Admitting: General Surgery

## 2015-11-30 ENCOUNTER — Ambulatory Visit: Payer: Medicare Other

## 2015-11-30 DIAGNOSIS — Z880 Allergy status to penicillin: Secondary | ICD-10-CM | POA: Diagnosis not present

## 2015-11-30 DIAGNOSIS — Z87891 Personal history of nicotine dependence: Secondary | ICD-10-CM | POA: Insufficient documentation

## 2015-11-30 DIAGNOSIS — K219 Gastro-esophageal reflux disease without esophagitis: Secondary | ICD-10-CM | POA: Insufficient documentation

## 2015-11-30 DIAGNOSIS — M5136 Other intervertebral disc degeneration, lumbar region: Secondary | ICD-10-CM | POA: Insufficient documentation

## 2015-11-30 DIAGNOSIS — K5790 Diverticulosis of intestine, part unspecified, without perforation or abscess without bleeding: Secondary | ICD-10-CM | POA: Diagnosis not present

## 2015-11-30 DIAGNOSIS — Z800 Family history of malignant neoplasm of digestive organs: Secondary | ICD-10-CM | POA: Diagnosis not present

## 2015-11-30 DIAGNOSIS — M1990 Unspecified osteoarthritis, unspecified site: Secondary | ICD-10-CM | POA: Diagnosis not present

## 2015-11-30 DIAGNOSIS — K810 Acute cholecystitis: Secondary | ICD-10-CM

## 2015-11-30 DIAGNOSIS — Z7982 Long term (current) use of aspirin: Secondary | ICD-10-CM | POA: Insufficient documentation

## 2015-11-30 DIAGNOSIS — K8010 Calculus of gallbladder with chronic cholecystitis without obstruction: Secondary | ICD-10-CM | POA: Diagnosis not present

## 2015-11-30 DIAGNOSIS — Z8249 Family history of ischemic heart disease and other diseases of the circulatory system: Secondary | ICD-10-CM | POA: Insufficient documentation

## 2015-11-30 DIAGNOSIS — Z9849 Cataract extraction status, unspecified eye: Secondary | ICD-10-CM | POA: Diagnosis not present

## 2015-11-30 DIAGNOSIS — J45909 Unspecified asthma, uncomplicated: Secondary | ICD-10-CM | POA: Diagnosis not present

## 2015-11-30 DIAGNOSIS — K8020 Calculus of gallbladder without cholecystitis without obstruction: Secondary | ICD-10-CM | POA: Diagnosis not present

## 2015-11-30 DIAGNOSIS — Z85850 Personal history of malignant neoplasm of thyroid: Secondary | ICD-10-CM | POA: Diagnosis not present

## 2015-11-30 DIAGNOSIS — Z79899 Other long term (current) drug therapy: Secondary | ICD-10-CM | POA: Diagnosis not present

## 2015-11-30 DIAGNOSIS — I10 Essential (primary) hypertension: Secondary | ICD-10-CM | POA: Diagnosis not present

## 2015-11-30 DIAGNOSIS — E039 Hypothyroidism, unspecified: Secondary | ICD-10-CM | POA: Diagnosis not present

## 2015-11-30 DIAGNOSIS — E890 Postprocedural hypothyroidism: Secondary | ICD-10-CM | POA: Diagnosis not present

## 2015-11-30 DIAGNOSIS — K824 Cholesterolosis of gallbladder: Secondary | ICD-10-CM | POA: Diagnosis not present

## 2015-11-30 DIAGNOSIS — Z8719 Personal history of other diseases of the digestive system: Secondary | ICD-10-CM | POA: Diagnosis not present

## 2015-11-30 DIAGNOSIS — N3010 Interstitial cystitis (chronic) without hematuria: Secondary | ICD-10-CM | POA: Insufficient documentation

## 2015-11-30 DIAGNOSIS — Z7951 Long term (current) use of inhaled steroids: Secondary | ICD-10-CM | POA: Insufficient documentation

## 2015-11-30 DIAGNOSIS — R011 Cardiac murmur, unspecified: Secondary | ICD-10-CM | POA: Diagnosis not present

## 2015-11-30 HISTORY — DX: Family history of other specified conditions: Z84.89

## 2015-11-30 HISTORY — DX: Nausea with vomiting, unspecified: R11.2

## 2015-11-30 HISTORY — PX: CHOLECYSTECTOMY: SHX55

## 2015-11-30 HISTORY — DX: Gastro-esophageal reflux disease without esophagitis: K21.9

## 2015-11-30 HISTORY — DX: Other intervertebral disc degeneration, thoracolumbar region: M51.35

## 2015-11-30 HISTORY — DX: Hypothyroidism, unspecified: E03.9

## 2015-11-30 HISTORY — DX: Adverse effect of unspecified anesthetic, initial encounter: T41.45XA

## 2015-11-30 HISTORY — DX: Other specified postprocedural states: Z98.890

## 2015-11-30 HISTORY — DX: Inflammatory liver disease, unspecified: K75.9

## 2015-11-30 HISTORY — DX: Other complications of anesthesia, initial encounter: T88.59XA

## 2015-11-30 HISTORY — DX: Essential (primary) hypertension: I10

## 2015-11-30 SURGERY — LAPAROSCOPIC CHOLECYSTECTOMY WITH INTRAOPERATIVE CHOLANGIOGRAM
Anesthesia: General | Wound class: Clean Contaminated

## 2015-11-30 MED ORDER — PROPOFOL 10 MG/ML IV BOLUS
INTRAVENOUS | Status: DC | PRN
  Administered 2015-11-30: 150 mg via INTRAVENOUS

## 2015-11-30 MED ORDER — HYDROMORPHONE HCL 1 MG/ML IJ SOLN
0.5000 mg | INTRAMUSCULAR | Status: AC
  Administered 2015-11-30 (×4): 0.5 mg via INTRAVENOUS

## 2015-11-30 MED ORDER — ROCURONIUM BROMIDE 100 MG/10ML IV SOLN
INTRAVENOUS | Status: DC | PRN
  Administered 2015-11-30: 30 mg via INTRAVENOUS
  Administered 2015-11-30: 20 mg via INTRAVENOUS

## 2015-11-30 MED ORDER — ONDANSETRON HCL 4 MG/2ML IJ SOLN
INTRAMUSCULAR | Status: DC | PRN
  Administered 2015-11-30: 4 mg via INTRAVENOUS

## 2015-11-30 MED ORDER — SUGAMMADEX SODIUM 500 MG/5ML IV SOLN
INTRAVENOUS | Status: DC | PRN
  Administered 2015-11-30: 400 mg via INTRAVENOUS

## 2015-11-30 MED ORDER — SODIUM CHLORIDE 0.9 % IJ SOLN
INTRAMUSCULAR | Status: AC
Start: 2015-11-30 — End: 2015-11-30
  Filled 2015-11-30: qty 50

## 2015-11-30 MED ORDER — KETOROLAC TROMETHAMINE 30 MG/ML IJ SOLN
INTRAMUSCULAR | Status: DC | PRN
  Administered 2015-11-30: 30 mg via INTRAVENOUS

## 2015-11-30 MED ORDER — HYDROMORPHONE HCL 1 MG/ML IJ SOLN
INTRAMUSCULAR | Status: AC
  Filled 2015-11-30: qty 2

## 2015-11-30 MED ORDER — ACETAMINOPHEN 10 MG/ML IV SOLN
INTRAVENOUS | Status: AC
  Filled 2015-11-30: qty 100

## 2015-11-30 MED ORDER — FENTANYL CITRATE (PF) 100 MCG/2ML IJ SOLN
INTRAMUSCULAR | Status: DC | PRN
  Administered 2015-11-30: 100 ug via INTRAVENOUS
  Administered 2015-11-30 (×3): 50 ug via INTRAVENOUS

## 2015-11-30 MED ORDER — EPHEDRINE SULFATE 50 MG/ML IJ SOLN
INTRAMUSCULAR | Status: DC | PRN
  Administered 2015-11-30: 15 mg via INTRAVENOUS

## 2015-11-30 MED ORDER — ACETAMINOPHEN 10 MG/ML IV SOLN
INTRAVENOUS | Status: DC | PRN
  Administered 2015-11-30: 1000 mg via INTRAVENOUS

## 2015-11-30 MED ORDER — FENTANYL CITRATE (PF) 100 MCG/2ML IJ SOLN
25.0000 ug | INTRAMUSCULAR | Status: AC | PRN
  Administered 2015-11-30 (×6): 25 ug via INTRAVENOUS

## 2015-11-30 MED ORDER — CHLORHEXIDINE GLUCONATE 4 % EX LIQD: 1.0000 "application " | Freq: Once | CUTANEOUS | Status: DC

## 2015-11-30 MED ORDER — SODIUM CHLORIDE 0.9 % IV SOLN
INTRAVENOUS | Status: DC | PRN
  Administered 2015-11-30: 10 mL

## 2015-11-30 MED ORDER — CEFAZOLIN SODIUM-DEXTROSE 2-4 GM/100ML-% IV SOLN
2.0000 g | INTRAVENOUS | Status: AC
  Administered 2015-11-30: 2 g via INTRAVENOUS

## 2015-11-30 MED ORDER — ONDANSETRON HCL 4 MG/2ML IJ SOLN: 4.0000 mg | Freq: Once | INTRAMUSCULAR | Status: DC | PRN

## 2015-11-30 MED ORDER — DEXAMETHASONE SODIUM PHOSPHATE 10 MG/ML IJ SOLN
INTRAMUSCULAR | Status: DC | PRN
  Administered 2015-11-30: 5 mg via INTRAVENOUS

## 2015-11-30 MED ORDER — HYDROMORPHONE HCL 1 MG/ML IJ SOLN
INTRAMUSCULAR | Status: DC | PRN
  Administered 2015-11-30: 0.5 mg via INTRAVENOUS

## 2015-11-30 MED ORDER — LIDOCAINE HCL (CARDIAC) 20 MG/ML IV SOLN
INTRAVENOUS | Status: DC | PRN
  Administered 2015-11-30: 90 mg via INTRAVENOUS

## 2015-11-30 MED ORDER — CEFAZOLIN SODIUM-DEXTROSE 2-4 GM/100ML-% IV SOLN
INTRAVENOUS | Status: AC
  Administered 2015-11-30: 2 g via INTRAVENOUS
  Filled 2015-11-30: qty 100

## 2015-11-30 MED ORDER — FENTANYL CITRATE (PF) 100 MCG/2ML IJ SOLN
INTRAMUSCULAR | Status: AC
  Filled 2015-11-30: qty 4

## 2015-11-30 MED ORDER — LACTATED RINGERS IV SOLN
INTRAVENOUS | Status: DC
  Administered 2015-11-30 (×2): via INTRAVENOUS

## 2015-11-30 MED ORDER — OXYCODONE-ACETAMINOPHEN 5-325 MG PO TABS: 1.0000 | ORAL_TABLET | ORAL | Status: DC | PRN

## 2015-11-30 MED ORDER — MIDAZOLAM HCL 2 MG/2ML IJ SOLN
INTRAMUSCULAR | Status: DC | PRN
  Administered 2015-11-30: 1 mg via INTRAVENOUS

## 2015-11-30 SURGICAL SUPPLY — 37 items
ANCHOR TIS RET SYS 235ML (MISCELLANEOUS) IMPLANT
APPLICATOR SURGIFLO (MISCELLANEOUS) IMPLANT
APPLIER CLIP LOGIC TI 5 (MISCELLANEOUS) ×2 IMPLANT
BLADE SURG 11 STRL SS SAFETY (MISCELLANEOUS) ×2 IMPLANT
CANISTER SUCT 1200ML W/VALVE (MISCELLANEOUS) ×2 IMPLANT
CANNULA DILATOR 10 W/SLV (CANNULA) ×2 IMPLANT
CATH CHOLANG 76X19 KUMAR (CATHETERS) ×2 IMPLANT
CHLORAPREP W/TINT 26ML (MISCELLANEOUS) ×2 IMPLANT
DEFOGGER SCOPE WARMER CLEARIFY (MISCELLANEOUS) ×2 IMPLANT
DRAPE C-ARM XRAY 36X54 (DRAPES) ×2 IMPLANT
DRAPE INCISE IOBAN 66X45 STRL (DRAPES) ×2 IMPLANT
DRESSING TELFA 4X3 1S ST N-ADH (GAUZE/BANDAGES/DRESSINGS) ×2 IMPLANT
DRSG TEGADERM 2-3/8X2-3/4 SM (GAUZE/BANDAGES/DRESSINGS) ×8 IMPLANT
ELECT REM PT RETURN 9FT ADLT (ELECTROSURGICAL) ×2
ELECTRODE REM PT RTRN 9FT ADLT (ELECTROSURGICAL) ×1 IMPLANT
GLOVE BIO SURGEON STRL SZ7 (GLOVE) ×12 IMPLANT
GOWN STRL REUS W/ TWL LRG LVL3 (GOWN DISPOSABLE) ×3 IMPLANT
GOWN STRL REUS W/TWL LRG LVL3 (GOWN DISPOSABLE) ×3
GRASPER SUT TROCAR 14GX15 (MISCELLANEOUS) ×2 IMPLANT
HEMOSTAT SURGICEL 2X3 (HEMOSTASIS) IMPLANT
IRRIGATION STRYKERFLOW (MISCELLANEOUS) ×1 IMPLANT
IRRIGATOR STRYKERFLOW (MISCELLANEOUS) ×2
IV LACTATED RINGERS 1000ML (IV SOLUTION) ×2 IMPLANT
KIT RM TURNOVER STRD PROC AR (KITS) ×2 IMPLANT
LABEL OR SOLS (LABEL) ×2 IMPLANT
NDL INSUFF ACCESS 14 VERSASTEP (NEEDLE) ×2 IMPLANT
PACK LAP CHOLECYSTECTOMY (MISCELLANEOUS) ×2 IMPLANT
SCISSORS METZENBAUM CVD 33 (INSTRUMENTS) ×2 IMPLANT
SLEEVE ENDOPATH XCEL 5M (ENDOMECHANICALS) ×4 IMPLANT
SPOGE SURGIFLO 8M (HEMOSTASIS)
SPONGE SURGIFLO 8M (HEMOSTASIS) IMPLANT
STRIP CLOSURE SKIN 1/2X4 (GAUZE/BANDAGES/DRESSINGS) ×2 IMPLANT
SUT VIC AB 0 SH 27 (SUTURE) ×2 IMPLANT
SUT VIC AB 4-0 FS2 27 (SUTURE) ×4 IMPLANT
SWABSTK COMLB BENZOIN TINCTURE (MISCELLANEOUS) ×2 IMPLANT
TROCAR XCEL NON-BLD 5MMX100MML (ENDOMECHANICALS) ×2 IMPLANT
TUBING INSUFFLATOR HI FLOW (MISCELLANEOUS) ×2 IMPLANT

## 2015-11-30 NOTE — H&P (View-Only) (Signed)
Patient ID: [name], female   DOB: 13-Apr-1946, 70 y.o.   MRN: [id number]  Chief Complaint  Patient presents with  . Other    gall bladder    HPI [name] is a 70 y.o. female.  Here today for evaluation of her gall bladder. She states she was having upper abdominal pain associated with nausea. The pain goes around to her back. She states this has been going on for a awhile but worse since Easter weekend. Patient states that the episode Easter weekend lasted 2 hours and associated fever and chills. She stated she vomited that weekend and since she has been doing a bland diet and no further vomiting. Ultrasound was 11-16-15. Also about 1 mo ago she had an abnormal mammogra, core biopsy and excision done-LCIS and ALH. Pt is now on Evista  I have reviewed the history of present illness with the patient. HPI  Past Medical History  Diagnosis Date  . Diverticulosis   . Asthma   . Thyroid disease   . Bulging lumbar disc   . Heart murmur   . DDD (degenerative disc disease), lumbar   . Cancer [facility]) 1999    thyroid with recurrent 2006  . Interstitial cystitis     Past Surgical History  Procedure Laterality Date  . Tonsillectomy  1952  . Excisional hemorrhoidectomy  1975  . Eye surgery  2011  . Thyroidectomy  1999  . Bladder surgery  1984  . Colonoscopy  2005, 2015  . Hemorrhoid surgery  1975  . Hernia repair  2015  . Cataract extraction  2011  . Dilation and curettage of uterus  1975  . Breast surgery  [id number]    mass removed  . Breast surgery  April 2017    Dr Jack Quarto [facility]    Family History  Problem Relation Age of Onset  . Heart disease Mother   . Cancer Father     colon  . Heart disease Brother     Social History Social History  Substance Use Topics  . Smoking status: Former Smoker -- 15 years  . Smokeless tobacco: Never Used  . Alcohol Use: No    Allergies  Allergen Reactions  . Penicillins Hives    Current Outpatient Prescriptions   Medication Sig Dispense Refill  . ALBUTEROL IN Inhale into the lungs as needed.    Marland Kitchen aspirin 81 MG tablet Take 81 mg by mouth daily.    . celecoxib (CELEBREX) 100 MG capsule Take 200 mg by mouth daily.    . cetirizine (ZYRTEC) 10 MG tablet Take 10 mg by mouth daily.    . Cholecalciferol (VITAMIN D-3) 1000 units CAPS Take by mouth daily.    . clonazePAM (KLONOPIN) 0.5 MG tablet Take 0.5 mg by mouth 2 (two) times daily as needed for anxiety.    . cycloSPORINE (RESTASIS) 0.05 % ophthalmic emulsion 1 drop 2 (two) times daily.    . Flaxseed, Linseed, (FLAXSEED OIL PO) Take by mouth.    . fluticasone (FLONASE) 50 MCG/ACT nasal spray Place 1 spray into both nostrils daily.    . Fluticasone-Salmeterol (ADVAIR) 250-50 MCG/DOSE AEPB Inhale 1 puff into the lungs 3 (three) times daily.    . furosemide (LASIX) 20 MG tablet Take 40 mg by mouth daily.     Marland Kitchen gabapentin (NEURONTIN) 300 MG capsule Take 300 mg by mouth 2 (two) times daily.     Marland Kitchen HYDROcodone-acetaminophen (NORCO) 7.5-325 MG tablet   0  . levothyroxine (SYNTHROID, LEVOTHROID)  150 MCG tablet Take 125 mcg by mouth daily before breakfast.     . pantoprazole (PROTONIX) 40 MG tablet Take 1 tablet by mouth daily.    . Phenazopyridine HCl (AZO TABS PO) Take by mouth.    . raloxifene (EVISTA) 60 MG tablet Take 60 mg by mouth daily.    Marland Kitchen tiZANidine (ZANAFLEX) 4 MG capsule Take 4 mg by mouth 2 (two) times daily.    Marland Kitchen zolpidem (AMBIEN) 10 MG tablet Take 10 mg by mouth at bedtime as needed for sleep.     No current facility-administered medications for this visit.    Review of Systems Review of Systems  Constitutional: Negative.   Respiratory: Negative.   Cardiovascular: Negative.   Gastrointestinal: Positive for nausea. Negative for diarrhea and constipation.    Blood pressure 138/78, pulse 66, resp. rate 14, height 5\' 1"  (1.549 m), weight 165 lb (74.844 kg).  Physical Exam Physical Exam  Constitutional: She is oriented to person, place, and  time. Vital signs are normal. She appears well-developed and well-nourished.  Eyes: No scleral icterus.  Neck: Neck supple.  Cardiovascular: Normal rate and regular rhythm.   Edema in lower extremities   Pulmonary/Chest: Effort normal and breath sounds normal.  Abdominal: Soft. Bowel sounds are normal. There is no hepatomegaly. There is no tenderness.  Lymphadenopathy:    She has no cervical adenopathy.    She has no axillary adenopathy.  Neurological: She is alert and oriented to person, place, and time.  Skin: Skin is warm and dry.    Data Reviewed Ultrasound and previous notes reviewed. Has gallstones. Labs were normal  Assessment     Patient had a recent episode of what was most likely biliary colic. Recommended cholecystectomy and she is agreeable.     Plan   Patient to have laparoscopic cholecystectomy.  Laparoscopic Cholecystectomy with Intraoperative Cholangiogram. The procedure, including it's potential risks and complications (including but not limited to infection, bleeding, injury to intra-abdominal organs or bile ducts, bile leak, poor cosmetic result, sepsis and death) were discussed with the patient in detail. Non-operative options, including their inherent risks (acute calculous cholecystitis with possible choledocholithiasis or gallstone pancreatitis, with the risk of ascending cholangitis, sepsis, and death) were discussed as well. The patient expressed and understanding of what we discussed and wishes to proceed with laparoscopic cholecystectomy. The patient further understands that if it is technically not possible, or it is unsafe to proceed laparoscopically, that I will convert to an open cholecystectomy.    Patient's surgery has been scheduled for 11-30-15 at [facility]. It is okay for patient to continue 81 mg aspirin once daily.   PCP:  [name] This information has been scribed by Karie Fetch RN, BSN,BC.   SANKAR,SEEPLAPUTHUR G 11/25/2015, 5:00 PM

## 2015-11-30 NOTE — Anesthesia Procedure Notes (Signed)
Procedure Name: Intubation Date/Time: 11/30/2015 9:57 AM Performed by: [facility] Pre-anesthesia Checklist: Patient identified, Emergency Drugs available, Suction available, Patient being monitored and Timeout performed Patient Re-evaluated:Patient Re-evaluated prior to inductionOxygen Delivery Method: Circle system utilized Preoxygenation: Pre-oxygenation with 100% oxygen Intubation Type: IV induction Ventilation: Mask ventilation without difficulty Laryngoscope Size: Mac and 3 Grade View: Grade I Tube type: Oral Tube size: 7.0 mm Number of attempts: 1 Airway Equipment and Method: Patient positioned with wedge pillow and Stylet Placement Confirmation: ETT inserted through vocal cords under direct vision,  positive ETCO2 and breath sounds checked- equal and bilateral Secured at: 20 cm Dental Injury: Teeth and Oropharynx as per pre-operative assessment

## 2015-11-30 NOTE — Anesthesia Preprocedure Evaluation (Signed)
Anesthesia Evaluation  Patient identified by MRN, date of birth, ID band Patient awake    Reviewed: Allergy & Precautions, NPO status , Patient's Chart, lab work & pertinent test results, reviewed documented beta blocker date and time   History of Anesthesia Complications (+) PONV and history of anesthetic complications  Airway Mallampati: II  TM Distance: >3 FB     Dental  (+) Chipped   Pulmonary asthma , former smoker,           Cardiovascular hypertension, Pt. on medications      Neuro/Psych    GI/Hepatic GERD  Controlled,(+) Hepatitis -  Endo/Other  Hypothyroidism   Renal/GU      Musculoskeletal  (+) Arthritis ,   Abdominal   Peds  Hematology   Anesthesia Other Findings   Reproductive/Obstetrics                             Anesthesia Physical Anesthesia Plan  ASA: III  Anesthesia Plan: General   Post-op Pain Management:    Induction: Intravenous  Airway Management Planned: Oral ETT  Additional Equipment:   Intra-op Plan:   Post-operative Plan:   Informed Consent: I have reviewed the patients History and Physical, chart, labs and discussed the procedure including the risks, benefits and alternatives for the proposed anesthesia with the patient or authorized representative who has indicated his/her understanding and acceptance.     Plan Discussed with: CRNA  Anesthesia Plan Comments:         Anesthesia Quick Evaluation

## 2015-11-30 NOTE — Interval H&P Note (Signed)
History and Physical Interval Note:  11/30/2015 9:15 AM  [name]  has presented today for surgery, with the diagnosis of GALLSTONES  The various methods of treatment have been discussed with the patient and family. After consideration of risks, benefits and other options for treatment, the patient has consented to  Procedure(s): LAPAROSCOPIC CHOLECYSTECTOMY WITH INTRAOPERATIVE CHOLANGIOGRAM (N/A) as a surgical intervention .  [name] history has been reviewed, patient examined, no change in status, stable for surgery.  I have reviewed the patient's chart and labs.  Questions were answered to the patient's satisfaction.     SANKAR,SEEPLAPUTHUR G

## 2015-11-30 NOTE — Discharge Instructions (Signed)

## 2015-11-30 NOTE — Anesthesia Postprocedure Evaluation (Signed)
Anesthesia Post Note  Patient: [name]  Procedure(s) Performed: Procedure(s) (LRB): LAPAROSCOPIC CHOLECYSTECTOMY WITH INTRAOPERATIVE CHOLANGIOGRAM (N/A)  Patient location during evaluation: PACU Anesthesia Type: General Level of consciousness: awake and alert Pain management: pain level controlled Vital Signs Assessment: post-procedure vital signs reviewed and stable Respiratory status: spontaneous breathing, nonlabored ventilation, respiratory function stable and patient connected to nasal cannula oxygen Cardiovascular status: blood pressure returned to baseline and stable Postop Assessment: no signs of nausea or vomiting Anesthetic complications: no    Last Vitals:  Filed Vitals:   11/30/15 1152 11/30/15 1203  BP: 169/85 179/72  Pulse: 73 70  Temp: 36.6 C 36.6 C  Resp: 17 16    Last Pain:  Filed Vitals:   11/30/15 1205  PainSc: [place]

## 2015-11-30 NOTE — Transfer of Care (Signed)
Immediate Anesthesia Transfer of Care Note  Patient: [name]  Procedure(s) Performed: Procedure(s): LAPAROSCOPIC CHOLECYSTECTOMY WITH INTRAOPERATIVE CHOLANGIOGRAM (N/A)  Patient Location: PACU  Anesthesia Type:General  Level of Consciousness: awake, alert  and oriented  Airway & Oxygen Therapy: Patient Spontanous Breathing and Patient connected to face mask oxygen  Post-op Assessment: Report given to RN and Post -op Vital signs reviewed and stable  Post vital signs: Reviewed and stable  Last Vitals:  Filed Vitals:   11/30/15 0804  BP: 162/86  Temp: 36.7 C  Resp: 16    Last Pain: There were no vitals filed for this visit.       Complications: VSS, NAD noted

## 2015-11-30 NOTE — Op Note (Signed)
Preop diagnosis:  Chronic cholecystitis and cholelithiasis  Post op diagnosis: Same  Operation: Laparoscopy cholecystectomy with intraoperative cholangiogram  Surgeon: S.G.Sankar  Assistant:     Anesthesia: Gen.  Complications: None  EBL: Approximately 20 mL  Drains: None  Description: Patient was put to sleep in supine position the operating table and the abdomen prepped and draped sterile field. Timeout was. Port site incision was made just below the umbilicus and a Veress needle introduced in the peritoneal cavity verified of the hanging drop method. Pneumoperitoneum was obtained followed by placement of a 10 mm port. The camera in place epigastric and 2 lateral 5 mm ports were placed. No apparent injury to underlying structures from initial entry was noted. Gallbladder was noted contain some visible stones but with areas of thickening in the wall and some adhesions of the surrounding fatty tissue and tenting of the duodenum however no acute changes were identified. With careful cephalad traction the adhesions were taken down first and the Hartman's pouch was then pulled up. The cystic duct and artery were easily identified and freed the common bile duct appeared to be normal size. The cystic artery was first hemoclipped and cut. Kumar clamp and catheter were positioned and cholangiogram completed showing normal-appearing bile duct both proximal and distal with no obstruction to flow. The catheter was used to decompress the gallbladder and then removed. Cystic duct was hemoclipped and cut. Gallbladder was dissected free from its bed using cautery for control of bleeding. Small amount of fluid was used to irrigate out the right upper quadrant and suctioned out. Gallbladder was placed in a retrieval bag and brought out through the umbilical port site-noted to have multiple small faceted stones measuring up to about 5 mm in size. The fascial opening the umbilicus was closed with 0 Vicryl placed  with a suture passer. The right upper quadrant inspected once again and all fluid suctioned out below and lateral to the liver. The ports removed after release of pneumoperitoneum. Skin incisions closed with subcuticular 4-0 Vicryl reinforced with Steri-Strips and tincture benzoin. Telfa and Tegaderm dressings were placed and patient subsequently returned recovery room stable condition.

## 2015-11-30 NOTE — OR Nursing (Signed)
Verified antibiotic order with Dr Jamal Collin no new orders

## 2015-12-01 LAB — SURGICAL PATHOLOGY

## 2015-12-03 DIAGNOSIS — J301 Allergic rhinitis due to pollen: Secondary | ICD-10-CM | POA: Diagnosis not present

## 2015-12-03 DIAGNOSIS — J3089 Other allergic rhinitis: Secondary | ICD-10-CM | POA: Diagnosis not present

## 2015-12-07 ENCOUNTER — Encounter: Payer: Self-pay | Admitting: General Surgery

## 2015-12-07 ENCOUNTER — Other Ambulatory Visit: Payer: Self-pay | Admitting: General Surgery

## 2015-12-07 ENCOUNTER — Ambulatory Visit (INDEPENDENT_AMBULATORY_CARE_PROVIDER_SITE_OTHER): Payer: Medicare Other | Admitting: General Surgery

## 2015-12-07 VITALS — BP 142/72 | HR 75 | Temp 97.1°F | Resp 13 | Ht 62.0 in | Wt 173.0 lb

## 2015-12-07 DIAGNOSIS — K810 Acute cholecystitis: Secondary | ICD-10-CM

## 2015-12-07 DIAGNOSIS — W57XXXA Bitten or stung by nonvenomous insect and other nonvenomous arthropods, initial encounter: Secondary | ICD-10-CM

## 2015-12-07 DIAGNOSIS — M5416 Radiculopathy, lumbar region: Secondary | ICD-10-CM | POA: Diagnosis not present

## 2015-12-07 DIAGNOSIS — M5136 Other intervertebral disc degeneration, lumbar region: Secondary | ICD-10-CM | POA: Diagnosis not present

## 2015-12-07 MED ORDER — DOXYCYCLINE HYCLATE 100 MG PO CAPS: 100.0000 mg | ORAL_CAPSULE | Freq: Two times a day (BID) | ORAL | Status: DC

## 2015-12-07 NOTE — Patient Instructions (Signed)
Patient to return as needed. 

## 2015-12-07 NOTE — Progress Notes (Signed)
This is a 70 year old female here today for her post op gallbladder removal done on 11/30/15. Patient states she [place] doing well. Patient found a deer tick on her 12/01/15. She had some chills last couple of days and had a temp of 99. Something earlier today. She otherwise feels ok. I have reviewed the history of present illness with the patient.   Port sites are clean and healing well. Lungs are clear and abdomen is soft. No bite mark noted in lower abdomen. Patient to return as scheduled for follow up on her breast LCIS/ALH. She is on Evista. Has appointment with GYN since she had some spotting of blood once  Empiric Rx with doxycycline 100mg  bid for 10 days  PCP:  [name] This information has been scribed by Gaspar Cola CMA.

## 2015-12-10 DIAGNOSIS — J3089 Other allergic rhinitis: Secondary | ICD-10-CM | POA: Diagnosis not present

## 2015-12-10 DIAGNOSIS — J301 Allergic rhinitis due to pollen: Secondary | ICD-10-CM | POA: Diagnosis not present

## 2015-12-15 DIAGNOSIS — J3089 Other allergic rhinitis: Secondary | ICD-10-CM | POA: Diagnosis not present

## 2015-12-15 DIAGNOSIS — J301 Allergic rhinitis due to pollen: Secondary | ICD-10-CM | POA: Diagnosis not present

## 2015-12-24 ENCOUNTER — Encounter: Payer: Self-pay | Admitting: Obstetrics and Gynecology

## 2015-12-24 ENCOUNTER — Ambulatory Visit (INDEPENDENT_AMBULATORY_CARE_PROVIDER_SITE_OTHER): Payer: Medicare Other | Admitting: Obstetrics and Gynecology

## 2015-12-24 ENCOUNTER — Telehealth: Payer: Self-pay | Admitting: Obstetrics and Gynecology

## 2015-12-24 VITALS — BP 167/79 | HR 69 | Ht 61.0 in | Wt 169.8 lb

## 2015-12-24 DIAGNOSIS — R309 Painful micturition, unspecified: Secondary | ICD-10-CM

## 2015-12-24 DIAGNOSIS — N950 Postmenopausal bleeding: Secondary | ICD-10-CM | POA: Diagnosis not present

## 2015-12-24 DIAGNOSIS — K649 Unspecified hemorrhoids: Secondary | ICD-10-CM

## 2015-12-24 DIAGNOSIS — D0500 Lobular carcinoma in situ of unspecified breast: Secondary | ICD-10-CM | POA: Diagnosis not present

## 2015-12-24 DIAGNOSIS — J3089 Other allergic rhinitis: Secondary | ICD-10-CM | POA: Diagnosis not present

## 2015-12-24 DIAGNOSIS — J301 Allergic rhinitis due to pollen: Secondary | ICD-10-CM | POA: Diagnosis not present

## 2015-12-24 LAB — POCT URINALYSIS DIPSTICK
Bilirubin, UA: NEGATIVE
Blood, UA: NEGATIVE
GLUCOSE UA: NEGATIVE
Nitrite, UA: NEGATIVE
Protein, UA: NEGATIVE
SPEC GRAV UA: 1.02
Urobilinogen, UA: 0.2
pH, UA: 5

## 2015-12-24 MED ORDER — HYDROCORTISONE ACETATE 25 MG RE SUPP: 25.0000 mg | Freq: Two times a day (BID) | RECTAL | Status: DC

## 2015-12-24 NOTE — Addendum Note (Signed)
Addended by: Elouise Munroe on: 12/24/2015 04:22 PM   Modules accepted: Orders

## 2015-12-24 NOTE — Addendum Note (Signed)
Addended by: Elouise Munroe on: 12/24/2015 03:36 PM   Modules accepted: Orders

## 2015-12-24 NOTE — Progress Notes (Signed)
GYN ENCOUNTER NOTE  Subjective:       [name] is a 70 y.o. G2P2 female is here for gynecologic evaluation of the following issues:  1. Postmenopausal bleeding 2. Hemorrhoids  Patient reports having postmenopausal spotting; she is not on hormone replacement therapy, but is on Evista 60 mg a day. She does have a history of breast LCIS/ALH Patient has had prior endometrial biopsy by Dr. Elisabeth Pigeon approximately a decade ago. Pathology was reportedly benign.  Patient also reports hemorrhoidal pain with some occasional bleeding from rectum with bowel movements. She has had hemorrhoid banding in the past. She is requesting short-term suppositories for hemorrhoids. She does understand that if the rectal bleeding does persist, she will then need GI or general surgical evaluation.   Gynecologic History No LMP recorded. Patient is postmenopausal. Contraception: post menopausal status  Obstetric History OB History  Gravida Para Term Preterm AB SAB TAB Ectopic Multiple Living  2 2        2     # Outcome Date GA Lbr Len/2nd Weight Sex Delivery Anes PTL Lv  2 Para           1 Para             Obstetric Comments  1st Menstrual Cycle:  10  1st Pregnancy:  21    Past Medical History  Diagnosis Date  . Diverticulosis   . Thyroid disease   . Bulging lumbar disc   . Heart murmur   . DDD (degenerative disc disease), lumbar   . Cancer [facility]) 1999    thyroid with recurrent 2006  . Interstitial cystitis   . Hypertension     OFF MEDS CURRENTLY PER PCP (MASOUD)  . Hypothyroidism   . GERD (gastroesophageal reflux disease)   . Hepatitis     AGE 44 "VIRAL"  . Family history of adverse reaction to anesthesia     PTS FATHER-UNSURE WHAT HAPPENED  . Complication of anesthesia     HEART STOPPED DURING TONSILLECTOMY (AGE 89) DUE TO ALLERGY TO ETHER  . PONV (postoperative nausea and vomiting)   . Asthma     WELL CONTROLLED  . DDD (degenerative disc disease), thoracolumbar     Past  Surgical History  Procedure Laterality Date  . Tonsillectomy  1952    AGE 89  . Excisional hemorrhoidectomy  1975  . Thyroidectomy  1999  . Bladder surgery  1984  . Colonoscopy  2005, 2015  . Hemorrhoid surgery  1975  . Hernia repair  2015  . Cataract extraction  2011  . Dilation and curettage of uterus  1975  . Breast surgery  [id number]    mass removed  . Breast surgery  April 2017    Dr Jack Quarto [facility]  . Eye surgery Left 2011  . Cholecystectomy N/A 11/30/2015    Procedure: LAPAROSCOPIC CHOLECYSTECTOMY WITH INTRAOPERATIVE CHOLANGIOGRAM;  Surgeon: Christene Lye, MD;  Location: [facility] ORS;  Service: General;  Laterality: N/A;    Current Outpatient Prescriptions on File Prior to Visit  Medication Sig Dispense Refill  . ALBUTEROL IN Inhale into the lungs as needed.    Marland Kitchen aspirin 81 MG tablet Take 81 mg by mouth daily.    . celecoxib (CELEBREX) 100 MG capsule Take 200 mg by mouth daily.    . cetirizine (ZYRTEC) 10 MG tablet Take 10 mg by mouth daily.    . Cholecalciferol (VITAMIN D-3) 1000 units CAPS Take by mouth daily.    . clonazePAM (KLONOPIN) 0.5 MG  tablet Take 0.5 mg by mouth 2 (two) times daily as needed for anxiety.    . cycloSPORINE (RESTASIS) 0.05 % ophthalmic emulsion 1 drop 2 (two) times daily.    . Flaxseed, Linseed, (FLAXSEED OIL PO) Take by mouth.    Marland Kitchen FLUoxetine (PROZAC) 40 MG capsule Take 40 mg by mouth every morning.    . fluticasone (FLONASE) 50 MCG/ACT nasal spray Place 1 spray into both nostrils daily.    . Fluticasone-Salmeterol (ADVAIR) 250-50 MCG/DOSE AEPB Inhale 1 puff into the lungs 3 (three) times daily as needed.     . furosemide (LASIX) 20 MG tablet Take 40 mg by mouth daily.     Marland Kitchen HYDROcodone-acetaminophen (NORCO) 7.5-325 MG tablet   0  . levothyroxine (SYNTHROID, LEVOTHROID) 150 MCG tablet Take 125 mcg by mouth daily before breakfast.     . olmesartan (BENICAR) 20 MG tablet Take 20 mg by mouth as needed (IF BP IS ELEVATED).    Marland Kitchen  oxyCODONE-acetaminophen (ROXICET) 5-325 MG tablet Take 1 tablet by mouth every 4 (four) hours as needed. 30 tablet 0  . pantoprazole (PROTONIX) 40 MG tablet Take 1 tablet by mouth every morning.     Marland Kitchen Phenazopyridine HCl (AZO TABS PO) Take 1 tablet by mouth at bedtime. Reported on 11/30/2015    . tiZANidine (ZANAFLEX) 4 MG capsule Take 4 mg by mouth as needed.     . zolpidem (AMBIEN) 10 MG tablet Take 10 mg by mouth at bedtime as needed for sleep.     No current facility-administered medications on file prior to visit.    Allergies  Allergen Reactions  . Ether     "HEART STOPPED WHILE IN SURGERY"  . Penicillins Hives  . Band-Aid Plus Antibiotic [Bacitracin-Polymyxin B] Rash    Social History   Social History  . Marital Status: Married    Spouse Name: N/A  . Number of Children: N/A  . Years of Education: N/A   Occupational History  . Not on file.   Social History Main Topics  . Smoking status: Former Smoker -- 1.00 packs/day for 15 years    Quit date: 11/25/1985  . Smokeless tobacco: Never Used  . Alcohol Use: No  . Drug Use: No  . Sexual Activity: No   Other Topics Concern  . Not on file   Social History Narrative    Family History  Problem Relation Age of Onset  . Heart disease Mother   . Cancer Father     colon  . Heart disease Brother     The following portions of the patient's history were reviewed and updated as appropriate: allergies, current medications, past family history, past medical history, past social history, past surgical history and problem list.  Review of Systems Review of Systems - General ROS: negative for - chills, fatigue, fever. POSITIVE-, hot flashes, night sweats Hematological and Lymphatic ROS: negative for - bleeding problems or swollen lymph nodes. POSITIVE-postmenopausal vaginal spotting and blood per rectum with bowel movements Gastrointestinal ROS: negative for - abdominal pain, change in bowel habits and nausea/vomiting. POSITIVE-  blood in stools, Musculoskeletal ROS: negative for - joint pain, muscle pain or muscular weakness Genito-Urinary ROS: negative for - change in menstrual cycle, dysmenorrhea, dyspareunia, dysuria, genital discharge, genital ulcers, hematuria, incontinence, irregular/heavy menses, nocturia or pelvic pain  Objective:   BP 167/79 mmHg  Pulse 69  Ht 5\' 1"  (1.549 m)  Wt 169 lb 12.8 oz (77.021 kg)  BMI 32.10 kg/m2 CONSTITUTIONAL: Well-developed, well-nourished female in no  acute distress  Assessment:   1. Postmenopausal bleeding, On Evista  2. History of hemorrhoids; blood per rectum  3. History of LCIS/ALH     Plan:   1. Pelvic ultrasound 2. Return in 1 week for physical exam and possible endometrial biopsy 3. Anusol HC suppositories twice a day 7 days: No refills. Patient understands if rectal bleeding persists, she will need GI/general surgery evaluation  A total of 15 minutes were spent face-to-face with the patient during this encounter and over half of that time dealt with counseling and coordination of care.  Brayton Mars, MD  Note: This dictation was prepared with Dragon dictation along with smaller phrase technology. Any transcriptional errors that result from this process are unintentional.

## 2015-12-24 NOTE — Addendum Note (Signed)
Addended by: Malachi Paradise on: 12/24/2015 01:53 PM   Modules accepted: Orders

## 2015-12-24 NOTE — Patient Instructions (Addendum)
1. Pelvic ultrasound is ordered 2. Follow-up 1 week after ultrasound for physical exam, and possible biopsy 3. Anusol HC suppository twice a day for 7 days  Postmenopausal Bleeding Postmenopausal bleeding is any bleeding after menopause. Menopause is when a woman's period stops. Any type of bleeding after menopause is concerning. It should be checked by your doctor. Any treatment will depend on the cause. HOME CARE Watch your condition for any changes.  Avoid the use of tampons and douches as told by your doctor.  Change your pads often.  Get regular pelvic exams and Pap tests.  Keep all appointments for tests as told by your doctor. GET HELP IF:  Your bleeding lasts for more than 1 week.  You have belly (abdominal) pain.  You have bleeding after sex (intercourse). GET HELP RIGHT AWAY IF:   You have a fever, chills, a headache, dizziness, muscle aches, and bleeding.  You have strong pain with bleeding.  You have clumps of blood (blood clots) coming from your vagina.  You have bleeding and need more than 1 pad an hour.  You feel like you are going to pass out (faint). MAKE SURE YOU:  Understand these instructions.  Will watch your condition.  Will get help right away if you are not doing well or get worse.   This information is not intended to replace advice given to you by your health care provider. Make sure you discuss any questions you have with your health care provider.   Document Released: 04/19/2008 Document Revised: 07/16/2013 Document Reviewed: 02/07/2013 Elsevier Interactive Patient Education [employer].

## 2015-12-24 NOTE — Telephone Encounter (Signed)
Ms. Zufall called saying Tiger never received the Rx for Hydrocortisone Suppository. When I looked in her chart the medication had "print" by it instead of electronically sent. She said she's in a lot of pain and would like the medication sent to her pharmacy asap if possible. Please give her a call if needed.  Pt's ph# [phone] Thank you.

## 2015-12-24 NOTE — Telephone Encounter (Signed)
Pt aware rx was printed by accident. E-rx. Pt aware.

## 2015-12-25 ENCOUNTER — Ambulatory Visit (INDEPENDENT_AMBULATORY_CARE_PROVIDER_SITE_OTHER): Payer: Medicare Other

## 2015-12-25 DIAGNOSIS — N950 Postmenopausal bleeding: Secondary | ICD-10-CM | POA: Diagnosis not present

## 2015-12-25 LAB — URINE CULTURE: ORGANISM ID, BACTERIA: NO GROWTH

## 2015-12-28 ENCOUNTER — Other Ambulatory Visit: Payer: Self-pay

## 2015-12-28 ENCOUNTER — Telehealth: Payer: Self-pay | Admitting: Obstetrics and Gynecology

## 2015-12-28 MED ORDER — FLUCONAZOLE 150 MG PO TABS: 150.0000 mg | ORAL_TABLET | Freq: Once | ORAL | Status: DC

## 2015-12-28 NOTE — Telephone Encounter (Signed)
This PT came to see you 6/1 and had [place]. Her fu had to be moved to 6/20 b/c next week you won't be here. Can you call pt with [place] results until she can come back on 6/20 please

## 2015-12-28 NOTE — Telephone Encounter (Signed)
1 

## 2015-12-28 NOTE — Telephone Encounter (Signed)
This PT came to see you 6/1 and had [place]. Her fu had to be moved to 6/20 b/c next weekyou won't be here.

## 2015-12-29 DIAGNOSIS — J301 Allergic rhinitis due to pollen: Secondary | ICD-10-CM | POA: Diagnosis not present

## 2015-12-29 DIAGNOSIS — J3089 Other allergic rhinitis: Secondary | ICD-10-CM | POA: Diagnosis not present

## 2015-12-29 NOTE — Telephone Encounter (Signed)
Pt aware of u/s.

## 2015-12-31 DIAGNOSIS — M5136 Other intervertebral disc degeneration, lumbar region: Secondary | ICD-10-CM | POA: Diagnosis not present

## 2015-12-31 DIAGNOSIS — J301 Allergic rhinitis due to pollen: Secondary | ICD-10-CM | POA: Diagnosis not present

## 2015-12-31 DIAGNOSIS — M5416 Radiculopathy, lumbar region: Secondary | ICD-10-CM | POA: Diagnosis not present

## 2015-12-31 DIAGNOSIS — J3089 Other allergic rhinitis: Secondary | ICD-10-CM | POA: Diagnosis not present

## 2016-01-05 ENCOUNTER — Ambulatory Visit: Payer: Medicare Other | Admitting: Obstetrics and Gynecology

## 2016-01-05 DIAGNOSIS — J301 Allergic rhinitis due to pollen: Secondary | ICD-10-CM | POA: Diagnosis not present

## 2016-01-05 DIAGNOSIS — J3089 Other allergic rhinitis: Secondary | ICD-10-CM | POA: Diagnosis not present

## 2016-01-08 ENCOUNTER — Telehealth: Payer: Self-pay | Admitting: Obstetrics and Gynecology

## 2016-01-08 DIAGNOSIS — R300 Dysuria: Secondary | ICD-10-CM

## 2016-01-08 DIAGNOSIS — R399 Unspecified symptoms and signs involving the genitourinary system: Secondary | ICD-10-CM

## 2016-01-08 MED ORDER — NITROFURANTOIN MONOHYD MACRO 100 MG PO CAPS: 100.0000 mg | ORAL_CAPSULE | Freq: Two times a day (BID) | ORAL | Status: DC

## 2016-01-08 NOTE — Telephone Encounter (Signed)
Called pt she states she is having severe burning and stinging with urination and is having a difficult time walking because of this. Advised pt on the use of cranberry juice and AZO. [place] Pt to call back for an appt if sx worsen or persist.

## 2016-01-08 NOTE — Telephone Encounter (Signed)
Patient called stating [place] is [place] beach in [place] and has a severe uti and is in a lot of pain. She wanted to know if something could be called in to the [facility] in [place] phone # [phone]. Patient is aware that Dr Enzo Bi nor his nurse are not in the office. The patients cell number is [phone]. Thanks

## 2016-01-12 ENCOUNTER — Ambulatory Visit: Payer: Medicare Other | Admitting: Obstetrics and Gynecology

## 2016-01-12 ENCOUNTER — Encounter: Payer: Self-pay | Admitting: Obstetrics and Gynecology

## 2016-01-12 ENCOUNTER — Ambulatory Visit (INDEPENDENT_AMBULATORY_CARE_PROVIDER_SITE_OTHER): Payer: Medicare Other | Admitting: Obstetrics and Gynecology

## 2016-01-12 VITALS — BP 146/74 | HR 69 | Ht 61.0 in | Wt 170.0 lb

## 2016-01-12 DIAGNOSIS — N949 Unspecified condition associated with female genital organs and menstrual cycle: Secondary | ICD-10-CM | POA: Diagnosis not present

## 2016-01-12 DIAGNOSIS — J3089 Other allergic rhinitis: Secondary | ICD-10-CM | POA: Diagnosis not present

## 2016-01-12 DIAGNOSIS — R300 Dysuria: Secondary | ICD-10-CM

## 2016-01-12 DIAGNOSIS — N950 Postmenopausal bleeding: Secondary | ICD-10-CM

## 2016-01-12 DIAGNOSIS — J301 Allergic rhinitis due to pollen: Secondary | ICD-10-CM | POA: Diagnosis not present

## 2016-01-12 LAB — POCT URINALYSIS DIPSTICK
BILIRUBIN UA: NEGATIVE
Glucose, UA: NEGATIVE
Ketones, UA: NEGATIVE
Nitrite, UA: NEGATIVE
PH UA: 5
Protein, UA: NEGATIVE
Spec Grav, UA: <=1.005
Urobilinogen, UA: 0.2

## 2016-01-12 NOTE — Progress Notes (Addendum)
Chief complaint: 1.  Postmenopausal bleeding; follow-up on ultrasound. 2.  UTI symptoms 3.  Vaginitis symptoms.-Itching and burning  Pelvic ultrasound demonstrated a upper limits of normal endometrial stripe measuring 4.1 mm.  Because of the patient's history of lobular carcinoma in situ, on Evista, decision is made to recommend endometrial biopsy to be sure there is no ongoing endometrial pathology.  Patient also was experiencing urinary frequency  And dysuria.  She apparently has had significant urologic issues in the past with interstitial cystitis type symptoms.  She has been managed at [facility] for this condition.  Patient has been complaining of yeast vaginitis symptoms and has treated with Monistat, Lotrimin, boric acid capsules, and Diflucan.  Most recent therapy was 2 days ago.  OBJECTIVE: BP 146/74 mmHg  Pulse 69  Ht 5\' 1"  (1.549 m)  Wt 170 lb (77.111 kg)  BMI 32.14 kg/m2 Pleasant, well-appearing white female in no acute distress. Abdomen: Soft, nontender, without organomegaly. Pelvic exam: External genitalia-atrophic. BUS-normal. Vagina-atrophic changes present. Cervix-no lesions; no significant discharge. Uterus-midplane, normal size and shape, mobile, nontender. Adnexa-nonpalpable, nontender. Rectovaginal-normal.  External exam.  PROCEDURE: Endometrial biopsy.  Endometrial Biopsy Procedure Note  Pre-operative Diagnosis: Postmenopausal bleeding; history of lobular carcinoma in situ  Post-operative Diagnosis: Same  Procedure Details   Urine pregnancy test was not done.  The risks (including infection, bleeding, pain, and uterine perforation) and benefits of the procedure were explained to the patient and Verbal informed consent was obtained.  Antibiotic prophylaxis against endocarditis was not indicated.   The patient was placed in the dorsal lithotomy position.  Bimanual exam showed the uterus to be in the neutral position.  A Graves' speculum inserted in the vagina.  Endocervical curettage with a Kevorkian curette was not performed.   A sharp tenaculum was applied to the anterior lip of the cervix for stabilization.  A sterile uterine sound was used to sound the uterus to a depth of 7cm.  A Mylex 11mm curette was used to sample the endometrium. Scant tissue was obtained. Sample was sent for pathologic examination.  Condition: Stable  Complications: None  Plan:  The patient was advised to call for any fever or for prolonged or severe pain or bleeding. She was advised to use NSAID and OTC acetaminophen as needed for mild to moderate pain. She was advised to avoid vaginal intercourse for 48 hours or until the bleeding has completely stopped.  Attending Physician Documentation: Brayton Mars, MD    ASSESSMENT: 1.  Postmenopausal bleeding. 2.  History of lobular carcinoma in situ, on Evista. 3.  Ultrasound notable for upper limits normal endometrial stripe measuring 4.1 mm 4.  UTI symptoms. 5.  Vaginitis symptoms; normal exam  PLAN: 1.  Endometrial biopsy as noted. 2.  UA with CNS ordered. 3.  Patient is aware of that Evista does not have significant impact on endometrial lining thickness (as tamoxifen does) 4.  Nu swab  A total of 15 minutes were spent face-to-face with the patient during this encounter and over half of that time dealt with counseling and coordination of care.  Brayton Mars, MD  Note: This dictation was prepared with Dragon dictation along with smaller phrase technology. Any transcriptional errors that result from this process are unintentional.

## 2016-01-12 NOTE — Patient Instructions (Signed)

## 2016-01-14 DIAGNOSIS — K219 Gastro-esophageal reflux disease without esophagitis: Secondary | ICD-10-CM | POA: Diagnosis not present

## 2016-01-14 DIAGNOSIS — D5701 Hb-SS disease with acute chest syndrome: Secondary | ICD-10-CM | POA: Diagnosis not present

## 2016-01-14 LAB — PATHOLOGY

## 2016-01-14 LAB — URINE CULTURE

## 2016-01-17 LAB — NUSWAB VAGINITIS (VG)
CANDIDA GLABRATA, NAA: NEGATIVE
Candida albicans, NAA: NEGATIVE
TRICH VAG BY NAA: NEGATIVE

## 2016-01-19 DIAGNOSIS — J209 Acute bronchitis, unspecified: Secondary | ICD-10-CM | POA: Diagnosis not present

## 2016-01-19 DIAGNOSIS — J4540 Moderate persistent asthma, uncomplicated: Secondary | ICD-10-CM | POA: Diagnosis not present

## 2016-01-19 DIAGNOSIS — J3089 Other allergic rhinitis: Secondary | ICD-10-CM | POA: Diagnosis not present

## 2016-01-19 DIAGNOSIS — J301 Allergic rhinitis due to pollen: Secondary | ICD-10-CM | POA: Diagnosis not present

## 2016-01-25 DIAGNOSIS — J0111 Acute recurrent frontal sinusitis: Secondary | ICD-10-CM | POA: Diagnosis not present

## 2016-02-02 DIAGNOSIS — R51 Headache: Secondary | ICD-10-CM | POA: Diagnosis not present

## 2016-02-02 DIAGNOSIS — H9319 Tinnitus, unspecified ear: Secondary | ICD-10-CM | POA: Diagnosis not present

## 2016-02-04 DIAGNOSIS — J301 Allergic rhinitis due to pollen: Secondary | ICD-10-CM | POA: Diagnosis not present

## 2016-02-04 DIAGNOSIS — J3089 Other allergic rhinitis: Secondary | ICD-10-CM | POA: Diagnosis not present

## 2016-02-05 ENCOUNTER — Other Ambulatory Visit: Payer: Self-pay | Admitting: Internal Medicine

## 2016-02-05 DIAGNOSIS — J329 Chronic sinusitis, unspecified: Secondary | ICD-10-CM | POA: Diagnosis not present

## 2016-02-05 DIAGNOSIS — K219 Gastro-esophageal reflux disease without esophagitis: Secondary | ICD-10-CM | POA: Diagnosis not present

## 2016-02-05 DIAGNOSIS — G44009 Cluster headache syndrome, unspecified, not intractable: Secondary | ICD-10-CM

## 2016-02-05 DIAGNOSIS — J449 Chronic obstructive pulmonary disease, unspecified: Secondary | ICD-10-CM | POA: Diagnosis not present

## 2016-02-05 DIAGNOSIS — S0511XA Contusion of eyeball and orbital tissues, right eye, initial encounter: Secondary | ICD-10-CM | POA: Diagnosis not present

## 2016-02-05 DIAGNOSIS — R5381 Other malaise: Secondary | ICD-10-CM | POA: Diagnosis not present

## 2016-02-09 DIAGNOSIS — J3089 Other allergic rhinitis: Secondary | ICD-10-CM | POA: Diagnosis not present

## 2016-02-09 DIAGNOSIS — J301 Allergic rhinitis due to pollen: Secondary | ICD-10-CM | POA: Diagnosis not present

## 2016-02-11 DIAGNOSIS — J301 Allergic rhinitis due to pollen: Secondary | ICD-10-CM | POA: Diagnosis not present

## 2016-02-11 DIAGNOSIS — J3089 Other allergic rhinitis: Secondary | ICD-10-CM | POA: Diagnosis not present

## 2016-02-16 DIAGNOSIS — J3089 Other allergic rhinitis: Secondary | ICD-10-CM | POA: Diagnosis not present

## 2016-02-16 DIAGNOSIS — J301 Allergic rhinitis due to pollen: Secondary | ICD-10-CM | POA: Diagnosis not present

## 2016-02-18 ENCOUNTER — Ambulatory Visit
Admission: RE | Admit: 2016-02-18 | Discharge: 2016-02-18 | Disposition: A | Discharge status: Home / Self Care | Payer: Medicare Other | Source: Ambulatory Visit | Attending: Internal Medicine | Admitting: Internal Medicine

## 2016-02-18 DIAGNOSIS — R51 Headache: Secondary | ICD-10-CM | POA: Diagnosis not present

## 2016-02-18 DIAGNOSIS — G319 Degenerative disease of nervous system, unspecified: Secondary | ICD-10-CM | POA: Insufficient documentation

## 2016-02-18 DIAGNOSIS — G44009 Cluster headache syndrome, unspecified, not intractable: Secondary | ICD-10-CM

## 2016-02-18 DIAGNOSIS — I6782 Cerebral ischemia: Secondary | ICD-10-CM | POA: Insufficient documentation

## 2016-02-19 DIAGNOSIS — J329 Chronic sinusitis, unspecified: Secondary | ICD-10-CM | POA: Diagnosis not present

## 2016-02-19 DIAGNOSIS — I10 Essential (primary) hypertension: Secondary | ICD-10-CM | POA: Diagnosis not present

## 2016-02-23 DIAGNOSIS — J301 Allergic rhinitis due to pollen: Secondary | ICD-10-CM | POA: Diagnosis not present

## 2016-02-23 DIAGNOSIS — J3089 Other allergic rhinitis: Secondary | ICD-10-CM | POA: Diagnosis not present

## 2016-03-01 DIAGNOSIS — J3089 Other allergic rhinitis: Secondary | ICD-10-CM | POA: Diagnosis not present

## 2016-03-01 DIAGNOSIS — J301 Allergic rhinitis due to pollen: Secondary | ICD-10-CM | POA: Diagnosis not present

## 2016-03-08 DIAGNOSIS — J301 Allergic rhinitis due to pollen: Secondary | ICD-10-CM | POA: Diagnosis not present

## 2016-03-08 DIAGNOSIS — J3089 Other allergic rhinitis: Secondary | ICD-10-CM | POA: Diagnosis not present

## 2016-03-17 DIAGNOSIS — J301 Allergic rhinitis due to pollen: Secondary | ICD-10-CM | POA: Diagnosis not present

## 2016-03-17 DIAGNOSIS — J3089 Other allergic rhinitis: Secondary | ICD-10-CM | POA: Diagnosis not present

## 2016-03-18 DIAGNOSIS — M5136 Other intervertebral disc degeneration, lumbar region: Secondary | ICD-10-CM | POA: Diagnosis not present

## 2016-03-18 DIAGNOSIS — M5416 Radiculopathy, lumbar region: Secondary | ICD-10-CM | POA: Diagnosis not present

## 2016-03-21 ENCOUNTER — Other Ambulatory Visit: Payer: Self-pay

## 2016-03-22 DIAGNOSIS — J3089 Other allergic rhinitis: Secondary | ICD-10-CM | POA: Diagnosis not present

## 2016-03-22 DIAGNOSIS — J301 Allergic rhinitis due to pollen: Secondary | ICD-10-CM | POA: Diagnosis not present

## 2016-03-29 DIAGNOSIS — J3089 Other allergic rhinitis: Secondary | ICD-10-CM | POA: Diagnosis not present

## 2016-03-29 DIAGNOSIS — J301 Allergic rhinitis due to pollen: Secondary | ICD-10-CM | POA: Diagnosis not present

## 2016-03-30 ENCOUNTER — Other Ambulatory Visit: Payer: Self-pay | Admitting: General Surgery

## 2016-03-30 DIAGNOSIS — M5136 Other intervertebral disc degeneration, lumbar region: Secondary | ICD-10-CM | POA: Diagnosis not present

## 2016-03-30 DIAGNOSIS — D0500 Lobular carcinoma in situ of unspecified breast: Secondary | ICD-10-CM

## 2016-03-30 DIAGNOSIS — M5416 Radiculopathy, lumbar region: Secondary | ICD-10-CM | POA: Diagnosis not present

## 2016-03-30 NOTE — Progress Notes (Unsigned)
This encounter was created in error - please disregard.

## 2016-04-05 DIAGNOSIS — J301 Allergic rhinitis due to pollen: Secondary | ICD-10-CM | POA: Diagnosis not present

## 2016-04-05 DIAGNOSIS — J3089 Other allergic rhinitis: Secondary | ICD-10-CM | POA: Diagnosis not present

## 2016-04-12 DIAGNOSIS — J301 Allergic rhinitis due to pollen: Secondary | ICD-10-CM | POA: Diagnosis not present

## 2016-04-12 DIAGNOSIS — J3089 Other allergic rhinitis: Secondary | ICD-10-CM | POA: Diagnosis not present

## 2016-04-19 ENCOUNTER — Telehealth: Payer: Self-pay | Admitting: *Deleted

## 2016-04-19 DIAGNOSIS — J3089 Other allergic rhinitis: Secondary | ICD-10-CM | POA: Diagnosis not present

## 2016-04-19 DIAGNOSIS — J301 Allergic rhinitis due to pollen: Secondary | ICD-10-CM | POA: Diagnosis not present

## 2016-04-19 NOTE — Telephone Encounter (Signed)
Contacted patient regarding recall follow up appointment with Dr. Jamal Collin in October 2017. Patient states she is being followed by [facility] and did not need appointment.

## 2016-04-25 DIAGNOSIS — R922 Inconclusive mammogram: Secondary | ICD-10-CM | POA: Diagnosis not present

## 2016-04-25 DIAGNOSIS — R921 Mammographic calcification found on diagnostic imaging of breast: Secondary | ICD-10-CM | POA: Diagnosis not present

## 2016-04-25 DIAGNOSIS — D0501 Lobular carcinoma in situ of right breast: Secondary | ICD-10-CM | POA: Diagnosis not present

## 2016-04-26 DIAGNOSIS — J3089 Other allergic rhinitis: Secondary | ICD-10-CM | POA: Diagnosis not present

## 2016-04-26 DIAGNOSIS — J301 Allergic rhinitis due to pollen: Secondary | ICD-10-CM | POA: Diagnosis not present

## 2016-05-02 DIAGNOSIS — R928 Other abnormal and inconclusive findings on diagnostic imaging of breast: Secondary | ICD-10-CM | POA: Diagnosis not present

## 2016-05-02 DIAGNOSIS — Z6833 Body mass index (BMI) 33.0-33.9, adult: Secondary | ICD-10-CM | POA: Diagnosis not present

## 2016-05-03 DIAGNOSIS — J301 Allergic rhinitis due to pollen: Secondary | ICD-10-CM | POA: Diagnosis not present

## 2016-05-03 DIAGNOSIS — J3089 Other allergic rhinitis: Secondary | ICD-10-CM | POA: Diagnosis not present

## 2016-05-17 DIAGNOSIS — J3089 Other allergic rhinitis: Secondary | ICD-10-CM | POA: Diagnosis not present

## 2016-05-17 DIAGNOSIS — J301 Allergic rhinitis due to pollen: Secondary | ICD-10-CM | POA: Diagnosis not present

## 2016-05-18 DIAGNOSIS — M5136 Other intervertebral disc degeneration, lumbar region: Secondary | ICD-10-CM | POA: Diagnosis not present

## 2016-05-18 DIAGNOSIS — M5416 Radiculopathy, lumbar region: Secondary | ICD-10-CM | POA: Diagnosis not present

## 2016-05-18 DIAGNOSIS — Z23 Encounter for immunization: Secondary | ICD-10-CM | POA: Diagnosis not present

## 2016-05-24 DIAGNOSIS — J301 Allergic rhinitis due to pollen: Secondary | ICD-10-CM | POA: Diagnosis not present

## 2016-05-24 DIAGNOSIS — J3089 Other allergic rhinitis: Secondary | ICD-10-CM | POA: Diagnosis not present

## 2016-05-31 DIAGNOSIS — J3089 Other allergic rhinitis: Secondary | ICD-10-CM | POA: Diagnosis not present

## 2016-05-31 DIAGNOSIS — J301 Allergic rhinitis due to pollen: Secondary | ICD-10-CM | POA: Diagnosis not present

## 2016-06-03 ENCOUNTER — Other Ambulatory Visit
Admit: 2016-06-03 | Discharge: 2016-06-03 | Disposition: A | Discharge status: Home / Self Care | Payer: Medicare Other | Attending: Internal Medicine | Admitting: Internal Medicine

## 2016-06-03 DIAGNOSIS — J3089 Other allergic rhinitis: Secondary | ICD-10-CM | POA: Diagnosis not present

## 2016-06-03 DIAGNOSIS — J301 Allergic rhinitis due to pollen: Secondary | ICD-10-CM | POA: Diagnosis not present

## 2016-06-03 DIAGNOSIS — M353 Polymyalgia rheumatica: Secondary | ICD-10-CM | POA: Insufficient documentation

## 2016-06-03 DIAGNOSIS — J399 Disease of upper respiratory tract, unspecified: Secondary | ICD-10-CM | POA: Diagnosis not present

## 2016-06-03 DIAGNOSIS — J0101 Acute recurrent maxillary sinusitis: Secondary | ICD-10-CM | POA: Diagnosis not present

## 2016-06-03 DIAGNOSIS — M797 Fibromyalgia: Secondary | ICD-10-CM | POA: Insufficient documentation

## 2016-06-03 DIAGNOSIS — D5701 Hb-SS disease with acute chest syndrome: Secondary | ICD-10-CM | POA: Diagnosis not present

## 2016-06-03 DIAGNOSIS — E669 Obesity, unspecified: Secondary | ICD-10-CM | POA: Diagnosis not present

## 2016-06-03 LAB — CBC
HCT: 37.6 % (ref 35.0–47.0)
Hemoglobin: 13 g/dL (ref 12.0–16.0)
MCH: 31 pg (ref 26.0–34.0)
MCHC: 34.6 g/dL (ref 32.0–36.0)
MCV: 89.7 fL (ref 80.0–100.0)
Platelets: 303 10*3/uL (ref 150–440)
RBC: 4.19 MIL/uL (ref 3.80–5.20)
RDW: 12.9 % (ref 11.5–14.5)
WBC: 8 10*3/uL (ref 3.6–11.0)

## 2016-06-03 LAB — BASIC METABOLIC PANEL
Anion gap: 8 (ref 5–15)
BUN: 22 mg/dL — ABNORMAL HIGH (ref 6–20)
CO2: 24 mmol/L (ref 22–32)
Calcium: 9.8 mg/dL (ref 8.9–10.3)
Chloride: 103 mmol/L (ref 101–111)
Creatinine, Ser: 1.04 mg/dL — ABNORMAL HIGH (ref 0.44–1.00)
GFR calc Af Amer: >60 mL/min (ref >60)
GFR calc non Af Amer: 53 mL/min — ABNORMAL LOW (ref >60)
Glucose, Bld: 120 mg/dL — ABNORMAL HIGH (ref 65–99)
Potassium: 4.3 mmol/L (ref 3.5–5.1)
Sodium: 135 mmol/L (ref 135–145)

## 2016-06-03 LAB — C-REACTIVE PROTEIN: CRP: <0.8 mg/dL (ref <1.0)

## 2016-06-03 LAB — SEDIMENTATION RATE: Sed Rate: 8 mm/hr (ref 0–30)

## 2016-06-07 DIAGNOSIS — E038 Other specified hypothyroidism: Secondary | ICD-10-CM | POA: Diagnosis not present

## 2016-06-07 DIAGNOSIS — J301 Allergic rhinitis due to pollen: Secondary | ICD-10-CM | POA: Diagnosis not present

## 2016-06-07 DIAGNOSIS — J449 Chronic obstructive pulmonary disease, unspecified: Secondary | ICD-10-CM | POA: Diagnosis not present

## 2016-06-07 DIAGNOSIS — E669 Obesity, unspecified: Secondary | ICD-10-CM | POA: Diagnosis not present

## 2016-06-07 DIAGNOSIS — J3089 Other allergic rhinitis: Secondary | ICD-10-CM | POA: Diagnosis not present

## 2016-06-07 DIAGNOSIS — D5701 Hb-SS disease with acute chest syndrome: Secondary | ICD-10-CM | POA: Diagnosis not present

## 2016-06-21 DIAGNOSIS — J301 Allergic rhinitis due to pollen: Secondary | ICD-10-CM | POA: Diagnosis not present

## 2016-06-21 DIAGNOSIS — J3089 Other allergic rhinitis: Secondary | ICD-10-CM | POA: Diagnosis not present

## 2016-06-22 ENCOUNTER — Encounter: Payer: Medicare Other | Admitting: Obstetrics and Gynecology

## 2016-06-28 DIAGNOSIS — J3089 Other allergic rhinitis: Secondary | ICD-10-CM | POA: Diagnosis not present

## 2016-06-28 DIAGNOSIS — J301 Allergic rhinitis due to pollen: Secondary | ICD-10-CM | POA: Diagnosis not present

## 2016-07-05 DIAGNOSIS — J3089 Other allergic rhinitis: Secondary | ICD-10-CM | POA: Diagnosis not present

## 2016-07-05 DIAGNOSIS — J329 Chronic sinusitis, unspecified: Secondary | ICD-10-CM | POA: Diagnosis not present

## 2016-07-05 DIAGNOSIS — J449 Chronic obstructive pulmonary disease, unspecified: Secondary | ICD-10-CM | POA: Diagnosis not present

## 2016-07-05 DIAGNOSIS — E669 Obesity, unspecified: Secondary | ICD-10-CM | POA: Diagnosis not present

## 2016-07-05 DIAGNOSIS — E038 Other specified hypothyroidism: Secondary | ICD-10-CM | POA: Diagnosis not present

## 2016-07-05 DIAGNOSIS — J301 Allergic rhinitis due to pollen: Secondary | ICD-10-CM | POA: Diagnosis not present

## 2016-07-08 DIAGNOSIS — J3089 Other allergic rhinitis: Secondary | ICD-10-CM | POA: Diagnosis not present

## 2016-07-08 DIAGNOSIS — J301 Allergic rhinitis due to pollen: Secondary | ICD-10-CM | POA: Diagnosis not present

## 2016-07-12 DIAGNOSIS — J3089 Other allergic rhinitis: Secondary | ICD-10-CM | POA: Diagnosis not present

## 2016-07-12 DIAGNOSIS — J301 Allergic rhinitis due to pollen: Secondary | ICD-10-CM | POA: Diagnosis not present

## 2016-07-14 DIAGNOSIS — J3089 Other allergic rhinitis: Secondary | ICD-10-CM | POA: Diagnosis not present

## 2016-07-14 DIAGNOSIS — J301 Allergic rhinitis due to pollen: Secondary | ICD-10-CM | POA: Diagnosis not present

## 2016-07-19 DIAGNOSIS — J3089 Other allergic rhinitis: Secondary | ICD-10-CM | POA: Diagnosis not present

## 2016-07-19 DIAGNOSIS — J301 Allergic rhinitis due to pollen: Secondary | ICD-10-CM | POA: Diagnosis not present

## 2016-07-26 DIAGNOSIS — J301 Allergic rhinitis due to pollen: Secondary | ICD-10-CM | POA: Diagnosis not present

## 2016-07-26 DIAGNOSIS — J3089 Other allergic rhinitis: Secondary | ICD-10-CM | POA: Diagnosis not present

## 2016-08-04 DIAGNOSIS — J301 Allergic rhinitis due to pollen: Secondary | ICD-10-CM | POA: Diagnosis not present

## 2016-08-04 DIAGNOSIS — J3089 Other allergic rhinitis: Secondary | ICD-10-CM | POA: Diagnosis not present

## 2016-08-04 DIAGNOSIS — J3081 Allergic rhinitis due to animal (cat) (dog) hair and dander: Secondary | ICD-10-CM | POA: Diagnosis not present

## 2016-08-05 NOTE — Progress Notes (Deleted)
ANNUAL PREVENTATIVE CARE GYN  ENCOUNTER NOTE  Subjective:       [name] is a 71 y.o. G2P2 female here for a routine annual gynecologic exam.  Current complaints: 1.  ***    Gynecologic History No LMP recorded. Patient is postmenopausal. Contraception: post menopausal status Last Pap: . Results were: normal Last mammogram: . Results were: normal  Obstetric History OB History  Gravida Para Term Preterm AB Living  2 2       2   SAB TAB Ectopic Multiple Live Births               # Outcome Date GA Lbr Len/2nd Weight Sex Delivery Anes PTL Lv  2 Para           1 Para             Obstetric Comments  1st Menstrual Cycle:  10  1st Pregnancy:  21    Past Medical History:  Diagnosis Date  . Asthma    WELL CONTROLLED  . Bulging lumbar disc   . Cancer [facility]) 1999   thyroid with recurrent 2006  . Complication of anesthesia    HEART STOPPED DURING TONSILLECTOMY (AGE 56) DUE TO ALLERGY TO ETHER  . DDD (degenerative disc disease), lumbar   . DDD (degenerative disc disease), thoracolumbar   . Diverticulosis   . Family history of adverse reaction to anesthesia    PTS FATHER-UNSURE WHAT HAPPENED  . GERD (gastroesophageal reflux disease)   . Heart murmur   . Hepatitis    AGE 89 "VIRAL"  . Hypertension    OFF MEDS CURRENTLY PER PCP (MASOUD)  . Hypothyroidism   . Interstitial cystitis   . PONV (postoperative nausea and vomiting)   . Thyroid disease     Past Surgical History:  Procedure Laterality Date  . BLADDER SURGERY  1984  . BREAST SURGERY  1937,9024   mass removed  . BREAST SURGERY  April 2017   Dr Jack Quarto [facility]  . CATARACT EXTRACTION  2011  . CHOLECYSTECTOMY N/A 11/30/2015   Procedure: LAPAROSCOPIC CHOLECYSTECTOMY WITH INTRAOPERATIVE CHOLANGIOGRAM;  Surgeon: Christene Lye, MD;  Location: [facility] ORS;  Service: General;  Laterality: N/A;  . COLONOSCOPY  2005, 2015  . DILATION AND CURETTAGE OF UTERUS  1975  . EXCISIONAL HEMORRHOIDECTOMY  1975  . EYE SURGERY  Left 2011  . [place]  . HERNIA REPAIR  2015  . THYROIDECTOMY  1999  . TONSILLECTOMY  1952   AGE 56    Current Outpatient Prescriptions on File Prior to Visit  Medication Sig Dispense Refill  . ALBUTEROL IN Inhale into the lungs as needed.    Marland Kitchen aspirin 81 MG tablet Take 81 mg by mouth daily.    . celecoxib (CELEBREX) 100 MG capsule Take 200 mg by mouth daily.    . cetirizine (ZYRTEC) 10 MG tablet Take 10 mg by mouth daily.    . Cholecalciferol (VITAMIN D-3) 1000 units CAPS Take by mouth daily.    . clonazePAM (KLONOPIN) 0.5 MG tablet Take 0.5 mg by mouth 2 (two) times daily as needed for anxiety.    . cycloSPORINE (RESTASIS) 0.05 % ophthalmic emulsion 1 drop 2 (two) times daily.    . enalapril (VASOTEC) 10 MG tablet Take 10 mg by mouth daily.    . Flaxseed, Linseed, (FLAXSEED OIL PO) Take by mouth.    . fluconazole (DIFLUCAN) 150 MG tablet Take 1 tablet (150 mg total) by mouth once. 1 tablet 0  .  FLUoxetine (PROZAC) 40 MG capsule Take 40 mg by mouth every morning.    . fluticasone (FLONASE) 50 MCG/ACT nasal spray Place 1 spray into both nostrils daily.    . Fluticasone-Salmeterol (ADVAIR) 250-50 MCG/DOSE AEPB Inhale 1 puff into the lungs 3 (three) times daily as needed.     . furosemide (LASIX) 20 MG tablet Take 40 mg by mouth daily.     Marland Kitchen HYDROcodone-acetaminophen (NORCO) 7.5-325 MG tablet   0  . hydrocortisone (ANUSOL-HC) 25 MG suppository Place 1 suppository (25 mg total) rectally 2 (two) times daily. 12 suppository 0  . levothyroxine (SYNTHROID, LEVOTHROID) 150 MCG tablet Take 125 mcg by mouth daily before breakfast.     . nitrofurantoin, macrocrystal-monohydrate, (MACROBID) 100 MG capsule Take 1 capsule (100 mg total) by mouth 2 (two) times daily. 14 capsule 0  . olmesartan (BENICAR) 20 MG tablet Take 20 mg by mouth as needed (IF BP IS ELEVATED).    Marland Kitchen oxyCODONE-acetaminophen (ROXICET) 5-325 MG tablet Take 1 tablet by mouth every 4 (four) hours as needed. 30 tablet 0   . pantoprazole (PROTONIX) 40 MG tablet Take 1 tablet by mouth every morning.     Marland Kitchen Phenazopyridine HCl (AZO TABS PO) Take 1 tablet by mouth at bedtime. Reported on 11/30/2015    . potassium chloride SA (K-DUR,KLOR-CON) 20 MEQ tablet TK 1 T PO QD  2  . raloxifene (EVISTA) 60 MG tablet   10  . tiZANidine (ZANAFLEX) 4 MG capsule Take 4 mg by mouth as needed.     . zolpidem (AMBIEN) 10 MG tablet Take 10 mg by mouth at bedtime as needed for sleep.     No current facility-administered medications on file prior to visit.     Allergies  Allergen Reactions  . Ether     "HEART STOPPED WHILE IN SURGERY"  . Penicillins Hives  . Band-Aid Plus Antibiotic [Bacitracin-Polymyxin B] Rash    Social History   Social History  . Marital status: Married    Spouse name: N/A  . Number of children: N/A  . Years of education: N/A   Occupational History  . Not on file.   Social History Main Topics  . Smoking status: Former Smoker    Packs/day: 1.00    Years: 15.00    Quit date: 11/25/1985  . Smokeless tobacco: Never Used  . Alcohol use No  . Drug use: No  . Sexual activity: No   Other Topics Concern  . Not on file   Social History Narrative  . No narrative on file    Family History  Problem Relation Age of Onset  . Heart disease Mother   . Cancer Father     colon  . Heart disease Brother     The following portions of the patient's history were reviewed and updated as appropriate: allergies, current medications, past family history, past medical history, past social history, past surgical history and problem list.  Review of Systems ROS Review of Systems - General ROS: negative for - chills, fatigue, fever, hot flashes, night sweats, weight gain or weight loss Psychological ROS: negative for - anxiety, decreased libido, depression, mood swings, physical abuse or sexual abuse Ophthalmic ROS: negative for - blurry vision, eye pain or loss of vision ENT ROS: negative for - headaches,  hearing change, visual changes or vocal changes Allergy and Immunology ROS: negative for - hives, itchy/watery eyes or seasonal allergies Hematological and Lymphatic ROS: negative for - bleeding problems, bruising, swollen lymph nodes or weight  loss Endocrine ROS: negative for - galactorrhea, hair pattern changes, hot flashes, malaise/lethargy, mood swings, palpitations, polydipsia/polyuria, skin changes, temperature intolerance or unexpected weight changes Breast ROS: negative for - new or changing breast lumps or nipple discharge Respiratory ROS: negative for - cough or shortness of breath Cardiovascular ROS: negative for - chest pain, irregular heartbeat, palpitations or shortness of breath Gastrointestinal ROS: no abdominal pain, change in bowel habits, or black or bloody stools Genito-Urinary ROS: no dysuria, trouble voiding, or hematuria Musculoskeletal ROS: negative for - joint pain or joint stiffness Neurological ROS: negative for - bowel and bladder control changes Dermatological ROS: negative for rash and skin lesion changes   Objective:   There were no vitals taken for this visit. CONSTITUTIONAL: Well-developed, well-nourished female in no acute distress.  PSYCHIATRIC: Normal mood and affect. Normal behavior. Normal judgment and thought content. [place]: Alert and oriented to person, place, and time. Normal muscle tone coordination. No cranial nerve deficit noted. HENT:  Normocephalic, atraumatic, External right and left ear normal. Oropharynx is clear and moist EYES: Conjunctivae and EOM are normal. Pupils are equal, round, and reactive to light. No scleral icterus.  NECK: Normal range of motion, supple, no masses.  Normal thyroid.  SKIN: Skin is warm and dry. No rash noted. Not diaphoretic. No erythema. No pallor. CARDIOVASCULAR: Normal heart rate noted, regular rhythm, no murmur. RESPIRATORY: Clear to auscultation bilaterally. Effort and breath sounds normal, no problems with  respiration noted. BREASTS: Symmetric in size. No masses, skin changes, nipple drainage, or lymphadenopathy. ABDOMEN: Soft, normal bowel sounds, no distention noted.  No tenderness, rebound or guarding.  BLADDER: Normal PELVIC:  External Genitalia: Normal  BUS: Normal  Vagina: Normal  Cervix: Normal  Uterus: Normal  Adnexa: Normal  RV: {Blank multiple:19196::"External Exam NormaI","No Rectal Masses","Normal Sphincter tone"}  MUSCULOSKELETAL: Normal range of motion. No tenderness.  No cyanosis, clubbing, or edema.  2+ distal pulses. LYMPHATIC: No Axillary, Supraclavicular, or Inguinal Adenopathy.    Assessment:   Annual gynecologic examination 71 y.o. Contraception: post menopausal status bmi- Problem List Items Addressed This Visit    None    Visit Diagnoses    Well woman exam with routine gynecological exam    -  Primary      Plan:  Pap: Pap Co Test Mammogram: Ordered Stool Guaiac Testing:  Ordered Labs: thru pcp Routine preventative health maintenance measures emphasized: {Blank multiple:19196::"Exercise/Diet/Weight control","Tobacco Warnings","Alcohol/Substance use risks","Stress Management","Peer Pressure Issues","Safe Sex"} *** Return to Clinic - [facility], [place]

## 2016-08-09 ENCOUNTER — Encounter: Payer: Medicare Other | Admitting: Obstetrics and Gynecology

## 2016-08-09 DIAGNOSIS — J301 Allergic rhinitis due to pollen: Secondary | ICD-10-CM | POA: Diagnosis not present

## 2016-08-09 DIAGNOSIS — J3089 Other allergic rhinitis: Secondary | ICD-10-CM | POA: Diagnosis not present

## 2016-08-16 DIAGNOSIS — J3089 Other allergic rhinitis: Secondary | ICD-10-CM | POA: Diagnosis not present

## 2016-08-16 DIAGNOSIS — J301 Allergic rhinitis due to pollen: Secondary | ICD-10-CM | POA: Diagnosis not present

## 2016-08-23 DIAGNOSIS — J4540 Moderate persistent asthma, uncomplicated: Secondary | ICD-10-CM | POA: Diagnosis not present

## 2016-08-23 DIAGNOSIS — J3089 Other allergic rhinitis: Secondary | ICD-10-CM | POA: Diagnosis not present

## 2016-08-23 DIAGNOSIS — J301 Allergic rhinitis due to pollen: Secondary | ICD-10-CM | POA: Diagnosis not present

## 2016-08-23 IMAGING — CR DG CHOLANGIOGRAM OPERATIVE
3 series · 14 of 19 positions shown · non-contrast
Comparison: Abdominal ultrasound - 11/16/2015

CLINICAL DATA: Laparoscopic cholecystectomy

EXAM:
INTRAOPERATIVE CHOLANGIOGRAM
FLUOROSCOPY TIME:  44 seconds

[Series 9: cont. · 7 of 39 frames shown (1 of 2)]
[frame 1/39]
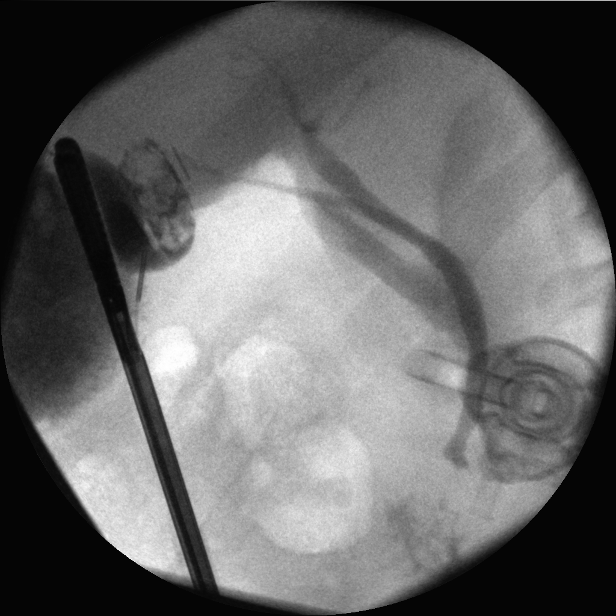
[frame 9/39]
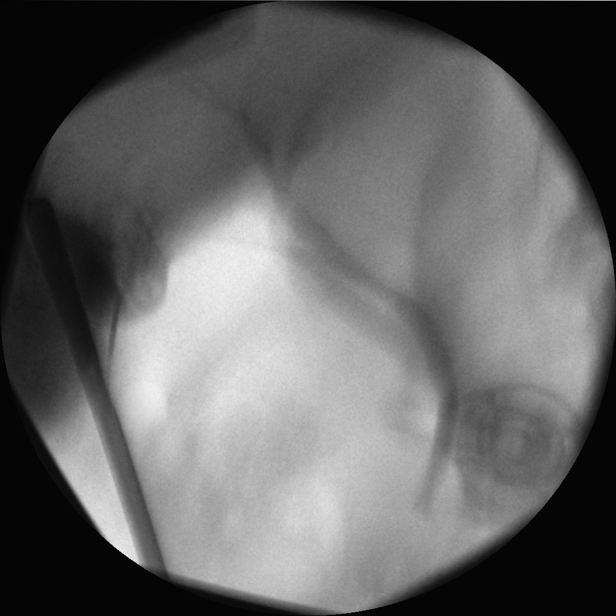
[frame 13/39]
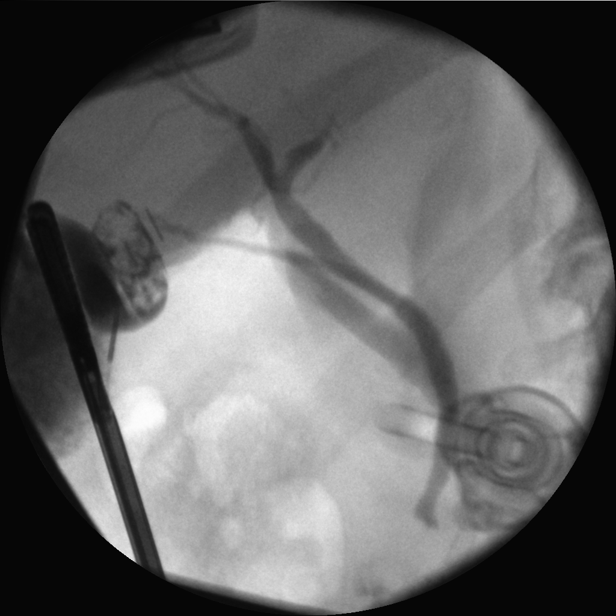
[frame 17/39]
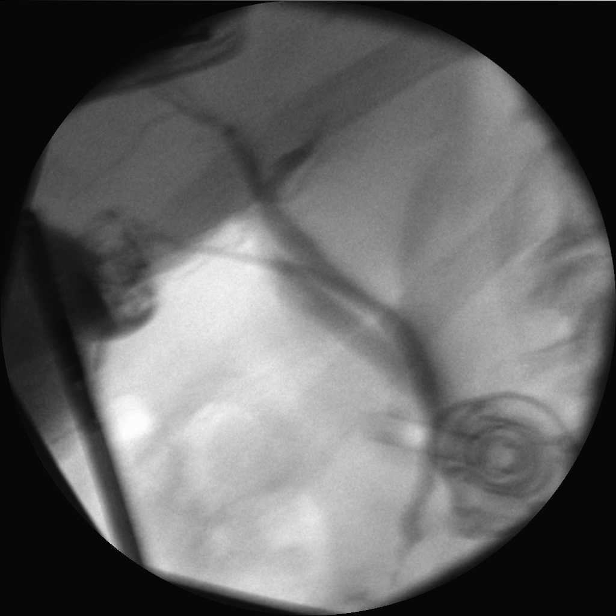
[frame 26/39]
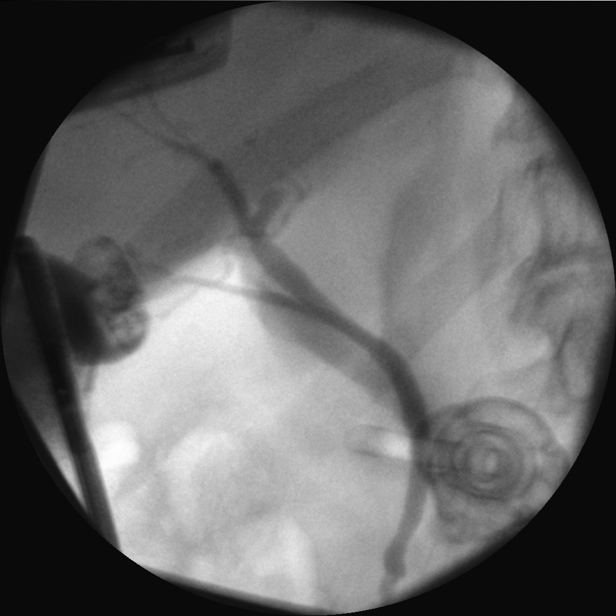
[frame 30/39]
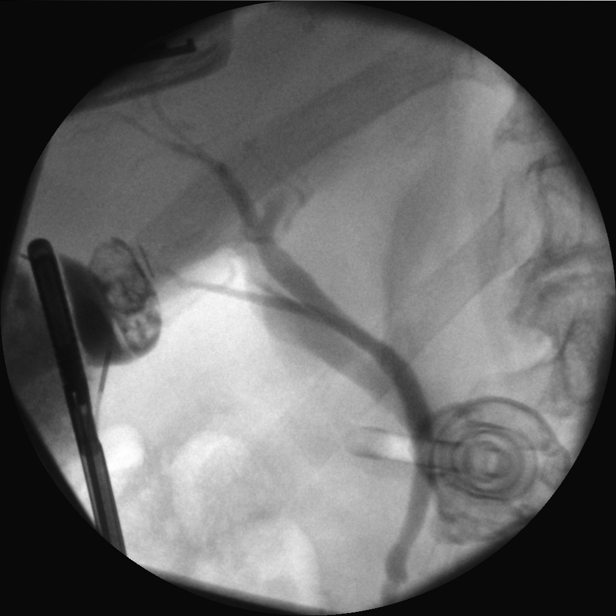
[frame 34/39]
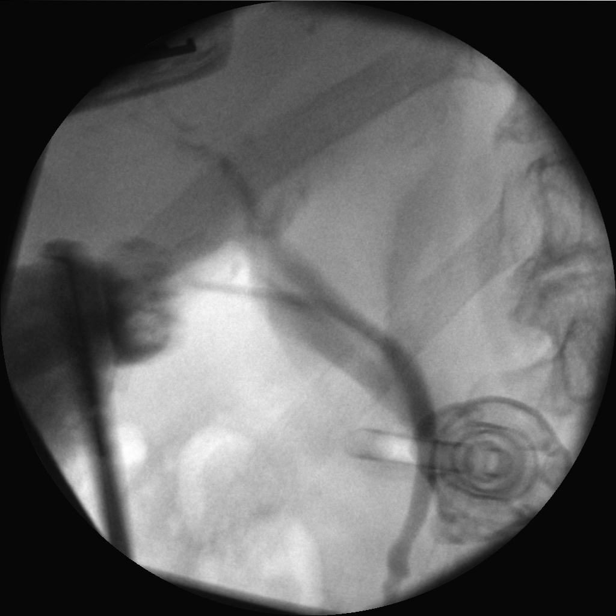

[Series 11: cont. · 6 of 7 frames shown (2 of 2)]
[frame 1/7]
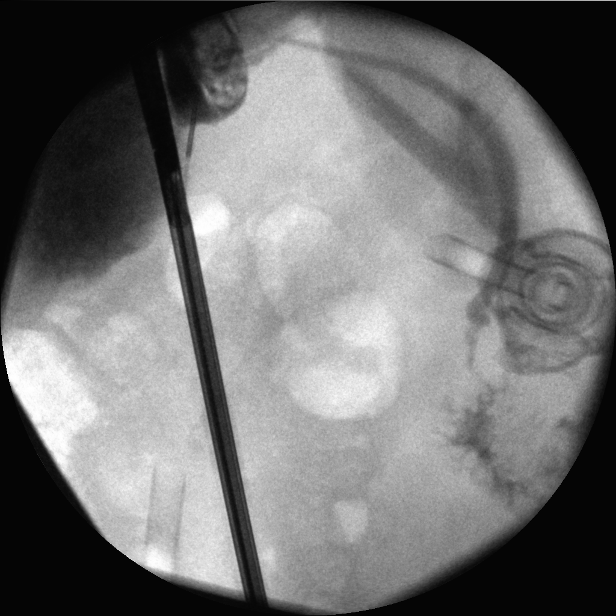
[frame 2/7]
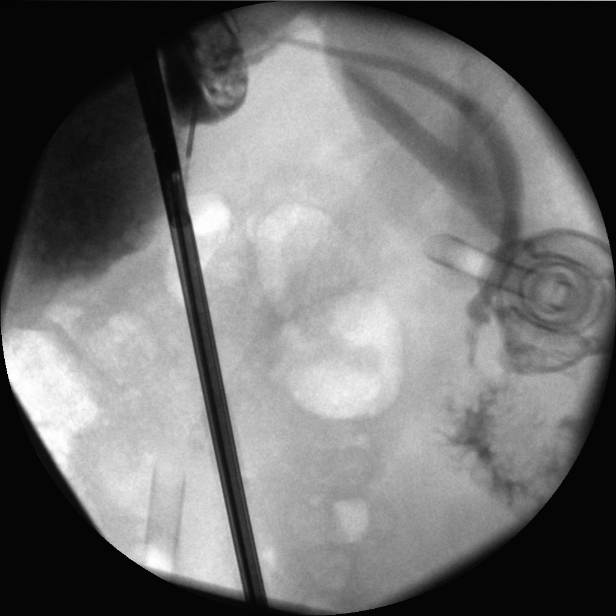
[frame 3/7]
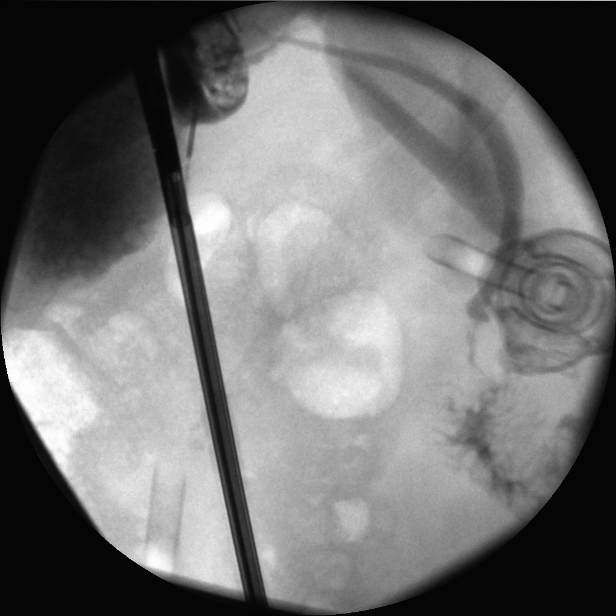
[frame 5/7]
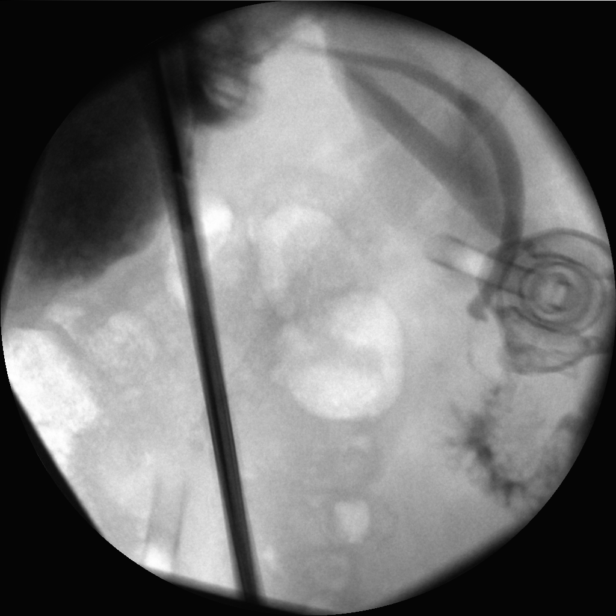
[frame 6/7]
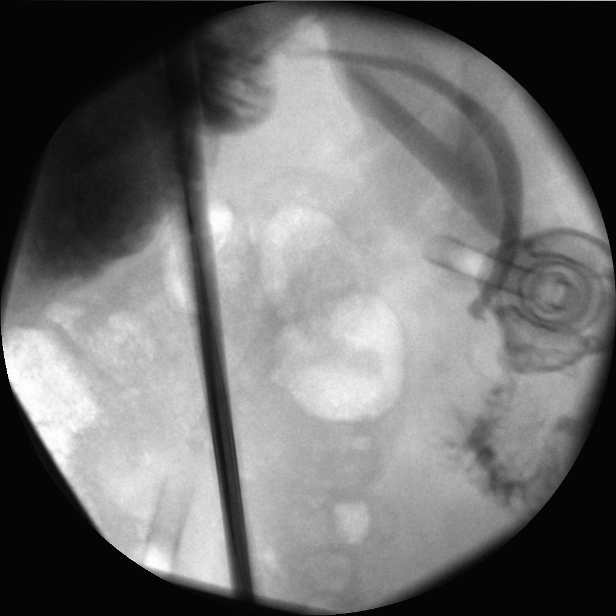
[frame 7/7]
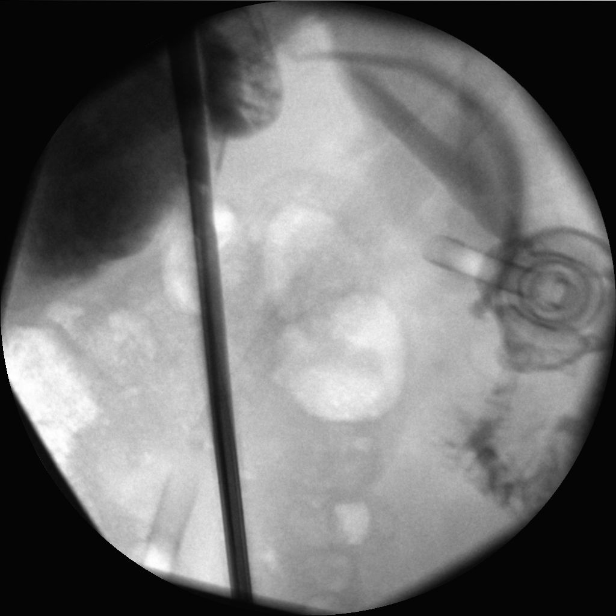

[[id]]
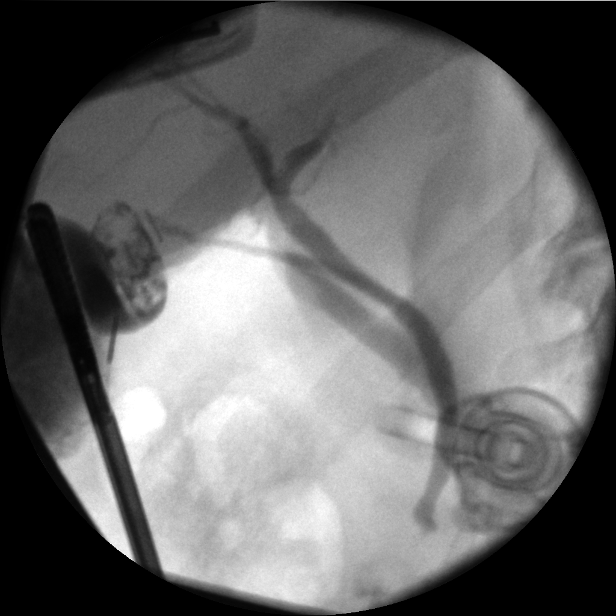

[14 of 19 positions shown; findings below may reference images not displayed]

FINDINGS: Intraoperative cholangiographic images of the right upper abdominal
quadrant during laparoscopic cholecystectomy are provided for
review.

Surgical clips overlie the expected location of the gallbladder
fossa.

Contrast injection demonstrates selective cannulation of the central
aspect of the cystic duct.

There is passage of contrast through the central aspect of the
cystic duct with filling of a non dilated common bile duct. There is
passage of contrast though the CBD and into the descending portion
of the duodenum.

There is minimal reflux of injected contrast into the common hepatic
duct and central aspect of the non dilated intrahepatic biliary
system.

There are multiple persistent filling defects within the neck of the
gallbladder compatible with cholelithiasis.

There are no discrete filling defects within the opacified portions
of the biliary system to suggest the presence of
choledocholithiasis.
IMPRESSION: Cholelithiasis without evidence of choledocholithiasis.

## 2016-08-24 DIAGNOSIS — M5416 Radiculopathy, lumbar region: Secondary | ICD-10-CM | POA: Diagnosis not present

## 2016-08-24 DIAGNOSIS — M5136 Other intervertebral disc degeneration, lumbar region: Secondary | ICD-10-CM | POA: Diagnosis not present

## 2016-08-29 NOTE — Progress Notes (Signed)
ANNUAL PREVENTATIVE CARE GYN  ENCOUNTER NOTE  Subjective:       [name] is a 71 y.o. G2P2 female here for a routine annual gynecologic exam.  Current complaints: 1.  Well Woman exam 2.  Needs Pap - 18 months since last screen 3.  Complaint of cystitis sx: Slight pain with urination- h/o interstitial cystitis. Current urinary sx include odor, burning, reduced strength of stream, nocturia, (-) hematuria, (-) n/v.  Pt reports these sx returned with starting Evista (raloxifen). No current urologist.  4.  Complaint of bowel incontinence sx: pt reports onset shortly following gallbladder removal.   Colonoscopy performed 2 years ago.  Gynecologic History No LMP recorded. Patient is postmenopausal. Contraception: post menopausal status Last Pap: . 2016 wnl no h/o abn Results were: normal Last mammogram: .10/2015 thru surgeon Margarita Rana) Results were: normal  Obstetric History OB History  Gravida Para Term Preterm AB Living  2 2       2   SAB TAB Ectopic Multiple Live Births               # Outcome Date GA Lbr Len/2nd Weight Sex Delivery Anes PTL Lv  2 Para           1 Para             Obstetric Comments  1st Menstrual Cycle:  10  1st Pregnancy:  21    Past Medical History:  Diagnosis Date  . Asthma    WELL CONTROLLED  . Bulging lumbar disc   . Cancer [facility]) 1999   thyroid with recurrent 2006  . Complication of anesthesia    HEART STOPPED DURING TONSILLECTOMY (AGE 89) DUE TO ALLERGY TO ETHER  . DDD (degenerative disc disease), lumbar   . DDD (degenerative disc disease), thoracolumbar   . Diverticulosis   . Family history of adverse reaction to anesthesia    PTS FATHER-UNSURE WHAT HAPPENED  . GERD (gastroesophageal reflux disease)   . Heart murmur   . Hepatitis    AGE 45 "VIRAL"  . Hypertension    OFF MEDS CURRENTLY PER PCP (MASOUD)  . Hypothyroidism   . Interstitial cystitis   . PONV (postoperative nausea and vomiting)   . Thyroid disease     Past Surgical  History:  Procedure Laterality Date  . BLADDER SURGERY  1984  . BREAST SURGERY  8563,1497   mass removed  . BREAST SURGERY  April 2017   Dr Jack Quarto [facility]  . CATARACT EXTRACTION  2011  . CHOLECYSTECTOMY N/A 11/30/2015   Procedure: LAPAROSCOPIC CHOLECYSTECTOMY WITH INTRAOPERATIVE CHOLANGIOGRAM;  Surgeon: Christene Lye, MD;  Location: [facility] ORS;  Service: General;  Laterality: N/A;  . COLONOSCOPY  2005, 2015  . DILATION AND CURETTAGE OF UTERUS  1975  . EXCISIONAL HEMORRHOIDECTOMY  1975  . EYE SURGERY Left 2011  . [place]  . HERNIA REPAIR  2015  . THYROIDECTOMY  1999  . TONSILLECTOMY  1952   AGE 89    Current Outpatient Prescriptions on File Prior to Visit  Medication Sig Dispense Refill  . ALBUTEROL IN Inhale into the lungs as needed.    Marland Kitchen aspirin 81 MG tablet Take 81 mg by mouth daily.    . celecoxib (CELEBREX) 100 MG capsule Take 200 mg by mouth daily.    . cetirizine (ZYRTEC) 10 MG tablet Take 10 mg by mouth daily.    . Cholecalciferol (VITAMIN D-3) 1000 units CAPS Take by mouth daily.    . clonazePAM (  KLONOPIN) 0.5 MG tablet Take 0.5 mg by mouth 2 (two) times daily as needed for anxiety.    . cycloSPORINE (RESTASIS) 0.05 % ophthalmic emulsion 1 drop 2 (two) times daily.    . enalapril (VASOTEC) 10 MG tablet Take 10 mg by mouth daily.    . Flaxseed, Linseed, (FLAXSEED OIL PO) Take by mouth.    . fluconazole (DIFLUCAN) 150 MG tablet Take 1 tablet (150 mg total) by mouth once. 1 tablet 0  . FLUoxetine (PROZAC) 40 MG capsule Take 40 mg by mouth every morning.    . fluticasone (FLONASE) 50 MCG/ACT nasal spray Place 1 spray into both nostrils daily.    . Fluticasone-Salmeterol (ADVAIR) 250-50 MCG/DOSE AEPB Inhale 1 puff into the lungs 3 (three) times daily as needed.     . furosemide (LASIX) 20 MG tablet Take 40 mg by mouth daily.     Marland Kitchen HYDROcodone-acetaminophen (NORCO) 7.5-325 MG tablet   0  . hydrocortisone (ANUSOL-HC) 25 MG suppository Place 1 suppository (25  mg total) rectally 2 (two) times daily. 12 suppository 0  . levothyroxine (SYNTHROID, LEVOTHROID) 150 MCG tablet Take 125 mcg by mouth daily before breakfast.     . nitrofurantoin, macrocrystal-monohydrate, (MACROBID) 100 MG capsule Take 1 capsule (100 mg total) by mouth 2 (two) times daily. 14 capsule 0  . olmesartan (BENICAR) 20 MG tablet Take 20 mg by mouth as needed (IF BP IS ELEVATED).    Marland Kitchen oxyCODONE-acetaminophen (ROXICET) 5-325 MG tablet Take 1 tablet by mouth every 4 (four) hours as needed. 30 tablet 0  . pantoprazole (PROTONIX) 40 MG tablet Take 1 tablet by mouth every morning.     Marland Kitchen Phenazopyridine HCl (AZO TABS PO) Take 1 tablet by mouth at bedtime. Reported on 11/30/2015    . potassium chloride SA (K-DUR,KLOR-CON) 20 MEQ tablet TK 1 T PO QD  2  . raloxifene (EVISTA) 60 MG tablet   10  . tiZANidine (ZANAFLEX) 4 MG capsule Take 4 mg by mouth as needed.     . zolpidem (AMBIEN) 10 MG tablet Take 10 mg by mouth at bedtime as needed for sleep.     No current facility-administered medications on file prior to visit.     Allergies  Allergen Reactions  . Ether     "HEART STOPPED WHILE IN SURGERY"  . Penicillins Hives  . Band-Aid Plus Antibiotic [Bacitracin-Polymyxin B] Rash    Social History   Social History  . Marital status: Married    Spouse name: N/A  . Number of children: N/A  . Years of education: N/A   Occupational History  . Not on file.   Social History Main Topics  . Smoking status: Former Smoker    Packs/day: 1.00    Years: 15.00    Quit date: 11/25/1985  . Smokeless tobacco: Never Used  . Alcohol use No  . Drug use: No  . Sexual activity: No   Other Topics Concern  . Not on file   Social History Narrative  . No narrative on file    Family History  Problem Relation Age of Onset  . Heart disease Mother   . Cancer Father     colon  . Heart disease Brother     The following portions of the patient's history were reviewed and updated as appropriate:  allergies, current medications, past family history, past medical history, past social history, past surgical history and problem list.  Review of Systems ROS Review of Systems - General ROS: negative for -  chills, fatigue, fever, hot flashes, night sweats, weight gain or weight loss Psychological ROS: negative for - anxiety, decreased libido, depression, mood swings, physical abuse or sexual abuse Ophthalmic ROS: negative for - blurry vision, eye pain or loss of vision ENT ROS: negative for - headaches, hearing change, visual changes or vocal changes Allergy and Immunology ROS: negative for - hives, itchy/watery eyes (+) seasonal allergies Hematological and Lymphatic ROS: negative for - bleeding problems, bruising, swollen lymph nodes or weight loss Endocrine ROS: negative for - galactorrhea, hair pattern changes, hot flashes, malaise/lethargy, mood swings, polydipsia/polyuria, skin changes, temperature intolerance or unexpected weight changes.  Breast ROS: negative for - new or changing breast lumps or nipple discharge since surgery Respiratory ROS: negative for - cough or shortness of breath Cardiovascular ROS: negative for - chest pain, irregular heartbeat, current tachycardia. Phx reports PMH of A-fib 3 years ago. Phx reported of heart murmur. Gastrointestinal ROS: no abdominal pain, n/v, recent sx of unresolved bowel incontinence following gallbladder surgery, (-) black or bloody stools Genito-Urinary ROS: sx of cystitis following start of Raloxifene include (+) urinary burning,(+) odor (+) decreased strength of stream, (+) nocturia, (-) hematuria  Musculoskeletal ROS: (+) back and joint pain. Phx of advanced degenerative disc disease for which pt. takes medication.  Neurological ROS: (+) bowel incontinence. (-)  bladder control changes Dermatological ROS: negative for rash and skin lesion changes   Objective:    BP 125/68   Pulse 81   Ht 5' (1.524 m)   Wt 171 lb 9.6 oz (77.8 kg)   BMI  33.51 kg/m  CONSTITUTIONAL: Well-developed, well-nourished female in no acute distress.  PSYCHIATRIC: Normal mood and affect. Normal behavior. Normal judgment and thought content. [place]: Alert and oriented to person, place, and time. Normal muscle tone coordination. No cranial nerve deficit noted. HENT:  Normocephalic, atraumatic, External right and left ear normal. Oropharynx is clear and moist EYES: Conjunctivae and EOM are normal. Pupils are slightly constricted and slightly reduced reaction to light and accomodation. No scleral icterus.  NECK: Normal range of motion, supple, no masses.  Surgically absent thyroid. SKIN: Skin is warm and dry. No rash noted. No erythema. No pallor.Slight but equal b/l ankle +1 edema consistent with recent salt intake. CARDIOVASCULAR: Normal heart rate noted, regular rhythm. No Homan's sign. RESPIRATORY: Clear to auscultation bilaterally. Effort and breath sounds normal, no problems with respiration noted. BREASTS: Symmetric in size. No masses, skin changes, nipple drainage, or lymphadenopathy. ABDOMEN: Soft, normal bowel sounds in all 4 quadrants, no distention noted.  No tenderness, no rebound tenderness or guarding.  BLADDER: Normal PELV  External Genitalia: Normal  BUS: Normal  Vagina: Atrophy of ruggae and slight paleness consistent with changes d/t age  Cervix: Normal; no lesions; no cervical motion tenderness  Uterus: Midplane. No enlargement. Mobile and nontender.  Adnexa: Normal; nonpalpable nontender  RV: External Exam NormaI, No Rectal Masses and Normal Sphincter tone  MUSCULOSKELETAL: Normal range of motion. No tenderness.  No cyanosis, clubbing. (+) equal b/l ankle edema. No palpable cords. (-) Homan's sign.  2+ distal pulses. LYMPHATIC: No Axillary, Supraclavicular, or Inguinal Adenopathy.    Assessment:   1. Annual gynecologic examination 71 y.o.  2. Pap 3. Contraception: post menopausal status 4. Obesity: bmi- 33 5. Menopausal  atrophy, minimally symptomatic    Plan:  Pap: Pap, Reflex if ASCUS Mammogram: April 2017 thru surgeon Stool Guaiac Testing:  Ordered Labs: thru pcp  Routine preventative health maintenance measures emphasized: Alcohol/Substance use risks, Stress Management and Peer  Pressure Issues  Continue with healthy eating and exercise with controlled weight loss Return to [place], CMA  Brayton Mars, MD   Note: This dictation was prepared with Dragon dictation along with smaller phrase technology. Any transcriptional errors that result from this process are unintentional.

## 2016-08-30 ENCOUNTER — Encounter: Payer: Self-pay | Admitting: Obstetrics and Gynecology

## 2016-08-30 ENCOUNTER — Ambulatory Visit (INDEPENDENT_AMBULATORY_CARE_PROVIDER_SITE_OTHER): Payer: Medicare Other | Admitting: Obstetrics and Gynecology

## 2016-08-30 VITALS — BP 125/68 | HR 81 | Ht 60.0 in | Wt 171.6 lb

## 2016-08-30 DIAGNOSIS — N952 Postmenopausal atrophic vaginitis: Secondary | ICD-10-CM | POA: Diagnosis not present

## 2016-08-30 DIAGNOSIS — Z1211 Encounter for screening for malignant neoplasm of colon: Secondary | ICD-10-CM | POA: Diagnosis not present

## 2016-08-30 DIAGNOSIS — Z01419 Encounter for gynecological examination (general) (routine) without abnormal findings: Secondary | ICD-10-CM | POA: Diagnosis not present

## 2016-08-30 DIAGNOSIS — R351 Nocturia: Secondary | ICD-10-CM | POA: Diagnosis not present

## 2016-08-30 DIAGNOSIS — Z780 Asymptomatic menopausal state: Secondary | ICD-10-CM | POA: Diagnosis not present

## 2016-08-30 LAB — POCT URINALYSIS DIPSTICK
Bilirubin, UA: NEGATIVE
Blood, UA: NEGATIVE
Glucose, UA: NEGATIVE
KETONES UA: NEGATIVE
LEUKOCYTES UA: NEGATIVE
Nitrite, UA: NEGATIVE
PROTEIN UA: NEGATIVE
Spec Grav, UA: 1.015
Urobilinogen, UA: NEGATIVE
pH, UA: 6

## 2016-08-30 NOTE — Patient Instructions (Signed)
1. Pap smear is performed today 2. Mammogram has already been obtained this year 3. Stool guaiac cards are given for colon cancer screening 4. Continue with healthy eating and exercise 5. Continue taking calcium with vitamin D supplementation 6. Evista 60 mg a day is to be continued 7. Return in 1 year for annual exam   Health Maintenance for Postmenopausal Women Introduction Menopause is a normal process in which your reproductive ability comes to an end. This process happens gradually over a span of months to years, usually between the ages of 62 and 61. Menopause is complete when you have missed 12 consecutive menstrual periods. It is important to talk with your health care provider about some of the most common conditions that affect postmenopausal women, such as heart disease, cancer, and bone loss (osteoporosis). Adopting a healthy lifestyle and getting preventive care can help to promote your health and wellness. Those actions can also lower your chances of developing some of these common conditions. What should I know about menopause? During menopause, you may experience a number of symptoms, such as:  Moderate-to-severe hot flashes.  Night sweats.  Decrease in sex drive.  Mood swings.  Headaches.  Tiredness.  Irritability.  Memory problems.  Insomnia. Choosing to treat or not to treat menopausal changes is an individual decision that you make with your health care provider. What should I know about hormone replacement therapy and supplements? Hormone therapy products are effective for treating symptoms that are associated with menopause, such as hot flashes and night sweats. Hormone replacement carries certain risks, especially as you become older. If you are thinking about using estrogen or estrogen with progestin treatments, discuss the benefits and risks with your health care provider. What should I know about heart disease and stroke? Heart disease, heart attack, and  stroke become more likely as you age. This may be due, in part, to the hormonal changes that your body experiences during menopause. These can affect how your body processes dietary fats, triglycerides, and cholesterol. Heart attack and stroke are both medical emergencies. There are many things that you can do to help prevent heart disease and stroke:  Have your blood pressure checked at least every 1-2 years. High blood pressure causes heart disease and increases the risk of stroke.  If you are 67-77 years old, ask your health care provider if you should take aspirin to prevent a heart attack or a stroke.  Do not use any tobacco products, including cigarettes, chewing tobacco, or electronic cigarettes. If you need help quitting, ask your health care provider.  It is important to eat a healthy diet and maintain a healthy weight.  Be sure to include plenty of vegetables, fruits, low-fat dairy products, and lean protein.  Avoid eating foods that are high in solid fats, added sugars, or salt (sodium).  Get regular exercise. This is one of the most important things that you can do for your health.  Try to exercise for at least 150 minutes each week. The type of exercise that you do should increase your heart rate and make you sweat. This is known as moderate-intensity exercise.  Try to do strengthening exercises at least twice each week. Do these in addition to the moderate-intensity exercise.  Know your numbers.Ask your health care provider to check your cholesterol and your blood glucose. Continue to have your blood tested as directed by your health care provider. What should I know about cancer screening? There are several types of cancer. Take the following  steps to reduce your risk and to catch any cancer development as early as possible. Breast Cancer  Practice breast self-awareness.  This means understanding how your breasts normally appear and feel.  It also means doing regular  breast self-exams. Let your health care provider know about any changes, no matter how small.  If you are 34 or older, have a clinician do a breast exam (clinical breast exam or CBE) every year. Depending on your age, family history, and medical history, it may be recommended that you also have a yearly breast X-ray (mammogram).  If you have a family history of breast cancer, talk with your health care provider about genetic screening.  If you are at high risk for breast cancer, talk with your health care provider about having an MRI and a mammogram every year.  Breast cancer (BRCA) gene test is recommended for women who have family members with BRCA-related cancers. Results of the assessment will determine the need for genetic counseling and BRCA1 and for BRCA2 testing. BRCA-related cancers include these types:  Breast. This occurs in males or females.  Ovarian.  Tubal. This may also be called fallopian tube cancer.  Cancer of the abdominal or pelvic lining (peritoneal cancer).  Prostate.  Pancreatic. Cervical, Uterine, and Ovarian Cancer  Your health care provider may recommend that you be screened regularly for cancer of the pelvic organs. These include your ovaries, uterus, and vagina. This screening involves a pelvic exam, which includes checking for microscopic changes to the surface of your cervix (Pap test).  For women ages 21-65, health care providers may recommend a pelvic exam and a Pap test every three years. For women ages 47-65, they may recommend the Pap test and pelvic exam, combined with testing for human papilloma virus (HPV), every five years. Some types of HPV increase your risk of cervical cancer. Testing for HPV may also be done on women of any age who have unclear Pap test results.  Other health care providers may not recommend any screening for nonpregnant women who are considered low risk for pelvic cancer and have no symptoms. Ask your health care provider if a  screening pelvic exam is right for you.  If you have had past treatment for cervical cancer or a condition that could lead to cancer, you need Pap tests and screening for cancer for at least 20 years after your treatment. If Pap tests have been discontinued for you, your risk factors (such as having a new sexual partner) need to be reassessed to determine if you should start having screenings again. Some women have medical problems that increase the chance of getting cervical cancer. In these cases, your health care provider may recommend that you have screening and Pap tests more often.  If you have a family history of uterine cancer or ovarian cancer, talk with your health care provider about genetic screening.  If you have vaginal bleeding after reaching menopause, tell your health care provider.  There are currently no reliable tests available to screen for ovarian cancer. Lung Cancer  Lung cancer screening is recommended for adults 90-23 years old who are at high risk for lung cancer because of a history of smoking. A yearly low-dose CT scan of the lungs is recommended if you:  Currently smoke.  Have a history of at least 30 pack-years of smoking and you currently smoke or have quit within the past 15 years. A pack-year is smoking an average of one pack of cigarettes per day for  one year. Yearly screening should:  Continue until it has been 15 years since you quit.  Stop if you develop a health problem that would prevent you from having lung cancer treatment. Colorectal Cancer  This type of cancer can be detected and can often be prevented.  Routine colorectal cancer screening usually begins at age 46 and continues through age 20.  If you have risk factors for colon cancer, your health care provider may recommend that you be screened at an earlier age.  If you have a family history of colorectal cancer, talk with your health care provider about genetic screening.  Your health care  provider may also recommend using home test kits to check for hidden blood in your stool.  A small camera at the end of a tube can be used to examine your colon directly (sigmoidoscopy or colonoscopy). This is done to check for the earliest forms of colorectal cancer.  Direct examination of the colon should be repeated every 5-10 years until age 37. However, if early forms of precancerous polyps or small growths are found or if you have a family history or genetic risk for colorectal cancer, you may need to be screened more often. Skin Cancer  Check your skin from head to toe regularly.  Monitor any moles. Be sure to tell your health care provider:  About any new moles or changes in moles, especially if there is a change in a mole's shape or color.  If you have a mole that is larger than the size of a pencil eraser.  If any of your family members has a history of skin cancer, especially at a young age, talk with your health care provider about genetic screening.  Always use sunscreen. Apply sunscreen liberally and repeatedly throughout the day.  Whenever you are outside, protect yourself by wearing long sleeves, pants, a wide-brimmed hat, and sunglasses. What should I know about osteoporosis? Osteoporosis is a condition in which bone destruction happens more quickly than new bone creation. After menopause, you may be at an increased risk for osteoporosis. To help prevent osteoporosis or the bone fractures that can happen because of osteoporosis, the following is recommended:  If you are 57-63 years old, get at least 1,000 mg of calcium and at least 600 mg of vitamin D per day.  If you are older than age 79 but younger than age 71, get at least 1,200 mg of calcium and at least 600 mg of vitamin D per day.  If you are older than age 87, get at least 1,200 mg of calcium and at least 800 mg of vitamin D per day. Smoking and excessive alcohol intake increase the risk of osteoporosis. Eat foods  that are rich in calcium and vitamin D, and do weight-bearing exercises several times each week as directed by your health care provider. What should I know about how menopause affects my mental health? Depression may occur at any age, but it is more common as you become older. Common symptoms of depression include:  Low or sad mood.  Changes in sleep patterns.  Changes in appetite or eating patterns.  Feeling an overall lack of motivation or enjoyment of activities that you previously enjoyed.  Frequent crying spells. Talk with your health care provider if you think that you are experiencing depression. What should I know about immunizations? It is important that you get and maintain your immunizations. These include:  Tetanus, diphtheria, and pertussis (Tdap) booster vaccine.  Influenza every year before  the flu season begins.  Pneumonia vaccine.  Shingles vaccine. Your health care provider may also recommend other immunizations. This information is not intended to replace advice given to you by your health care provider. Make sure you discuss any questions you have with your health care provider. Document Released: 09/02/2005 Document Revised: 01/29/2016 Document Reviewed: 04/14/2015  2017 [employer]vier

## 2016-08-31 DIAGNOSIS — R351 Nocturia: Secondary | ICD-10-CM | POA: Diagnosis not present

## 2016-08-31 DIAGNOSIS — Z1211 Encounter for screening for malignant neoplasm of colon: Secondary | ICD-10-CM | POA: Diagnosis not present

## 2016-08-31 DIAGNOSIS — Z01419 Encounter for gynecological examination (general) (routine) without abnormal findings: Secondary | ICD-10-CM | POA: Diagnosis not present

## 2016-09-01 DIAGNOSIS — J301 Allergic rhinitis due to pollen: Secondary | ICD-10-CM | POA: Diagnosis not present

## 2016-09-01 DIAGNOSIS — J3089 Other allergic rhinitis: Secondary | ICD-10-CM | POA: Diagnosis not present

## 2016-09-05 LAB — PAP IG W/ RFLX HPV ASCU: PAP Smear Comment: 0

## 2016-09-05 LAB — URINE CULTURE

## 2016-09-06 ENCOUNTER — Telehealth: Payer: Self-pay

## 2016-09-06 MED ORDER — NITROFURANTOIN MONOHYD MACRO 100 MG PO CAPS: 100.0000 mg | ORAL_CAPSULE | Freq: Two times a day (BID) | ORAL | 0 refills | Status: DC

## 2016-09-06 NOTE — Telephone Encounter (Signed)
Pt aware of results. Med erx. Pt is to start Levaquin today d/t cough/cold. Levaquin will treat uti. Pt advised to get Macrobid but do NOT take it until after she completes the Levaquin. Only if she has cystitis sx. Pt voices understanding.

## 2016-09-06 NOTE — Telephone Encounter (Signed)
-----   Message from Brayton Mars, MD sent at 09/06/2016  8:02 AM EST ----- Please notify - Abnormal Labs Due to cystitis symptoms, and light colonization of enterococcus, will recommend Macrobid twice a day for 7 days

## 2016-09-08 LAB — FECAL OCCULT BLOOD, IMMUNOCHEMICAL: Fecal Occult Bld: NEGATIVE

## 2016-09-11 DIAGNOSIS — M40204 Unspecified kyphosis, thoracic region: Secondary | ICD-10-CM | POA: Diagnosis not present

## 2016-09-11 DIAGNOSIS — J101 Influenza due to other identified influenza virus with other respiratory manifestations: Secondary | ICD-10-CM | POA: Diagnosis not present

## 2016-09-11 DIAGNOSIS — R509 Fever, unspecified: Secondary | ICD-10-CM | POA: Diagnosis not present

## 2016-09-11 DIAGNOSIS — B9689 Other specified bacterial agents as the cause of diseases classified elsewhere: Secondary | ICD-10-CM | POA: Diagnosis not present

## 2016-09-11 DIAGNOSIS — J208 Acute bronchitis due to other specified organisms: Secondary | ICD-10-CM | POA: Diagnosis not present

## 2016-09-20 DIAGNOSIS — E669 Obesity, unspecified: Secondary | ICD-10-CM | POA: Diagnosis not present

## 2016-09-20 DIAGNOSIS — J329 Chronic sinusitis, unspecified: Secondary | ICD-10-CM | POA: Diagnosis not present

## 2016-09-20 DIAGNOSIS — E038 Other specified hypothyroidism: Secondary | ICD-10-CM | POA: Diagnosis not present

## 2016-09-20 DIAGNOSIS — J449 Chronic obstructive pulmonary disease, unspecified: Secondary | ICD-10-CM | POA: Diagnosis not present

## 2016-09-23 DIAGNOSIS — J301 Allergic rhinitis due to pollen: Secondary | ICD-10-CM | POA: Diagnosis not present

## 2016-09-23 DIAGNOSIS — J3089 Other allergic rhinitis: Secondary | ICD-10-CM | POA: Diagnosis not present

## 2016-09-29 DIAGNOSIS — J3089 Other allergic rhinitis: Secondary | ICD-10-CM | POA: Diagnosis not present

## 2016-09-29 DIAGNOSIS — J301 Allergic rhinitis due to pollen: Secondary | ICD-10-CM | POA: Diagnosis not present

## 2016-10-04 DIAGNOSIS — J301 Allergic rhinitis due to pollen: Secondary | ICD-10-CM | POA: Diagnosis not present

## 2016-10-04 DIAGNOSIS — J3089 Other allergic rhinitis: Secondary | ICD-10-CM | POA: Diagnosis not present

## 2016-10-11 DIAGNOSIS — J3089 Other allergic rhinitis: Secondary | ICD-10-CM | POA: Diagnosis not present

## 2016-10-11 DIAGNOSIS — J301 Allergic rhinitis due to pollen: Secondary | ICD-10-CM | POA: Diagnosis not present

## 2016-10-20 DIAGNOSIS — J301 Allergic rhinitis due to pollen: Secondary | ICD-10-CM | POA: Diagnosis not present

## 2016-10-20 DIAGNOSIS — J3081 Allergic rhinitis due to animal (cat) (dog) hair and dander: Secondary | ICD-10-CM | POA: Diagnosis not present

## 2016-10-20 DIAGNOSIS — J3089 Other allergic rhinitis: Secondary | ICD-10-CM | POA: Diagnosis not present

## 2016-10-25 DIAGNOSIS — J301 Allergic rhinitis due to pollen: Secondary | ICD-10-CM | POA: Diagnosis not present

## 2016-10-25 DIAGNOSIS — J3089 Other allergic rhinitis: Secondary | ICD-10-CM | POA: Diagnosis not present

## 2016-10-27 DIAGNOSIS — J4540 Moderate persistent asthma, uncomplicated: Secondary | ICD-10-CM | POA: Diagnosis not present

## 2016-10-27 DIAGNOSIS — L299 Pruritus, unspecified: Secondary | ICD-10-CM | POA: Diagnosis not present

## 2016-10-27 DIAGNOSIS — J3089 Other allergic rhinitis: Secondary | ICD-10-CM | POA: Diagnosis not present

## 2016-10-27 DIAGNOSIS — J301 Allergic rhinitis due to pollen: Secondary | ICD-10-CM | POA: Diagnosis not present

## 2016-10-31 DIAGNOSIS — Z6831 Body mass index (BMI) 31.0-31.9, adult: Secondary | ICD-10-CM | POA: Diagnosis not present

## 2016-10-31 DIAGNOSIS — I10 Essential (primary) hypertension: Secondary | ICD-10-CM | POA: Diagnosis not present

## 2016-10-31 DIAGNOSIS — Z86000 Personal history of in-situ neoplasm of breast: Secondary | ICD-10-CM | POA: Diagnosis not present

## 2016-10-31 DIAGNOSIS — Z85850 Personal history of malignant neoplasm of thyroid: Secondary | ICD-10-CM | POA: Diagnosis not present

## 2016-10-31 DIAGNOSIS — R922 Inconclusive mammogram: Secondary | ICD-10-CM | POA: Diagnosis not present

## 2016-10-31 DIAGNOSIS — J45909 Unspecified asthma, uncomplicated: Secondary | ICD-10-CM | POA: Diagnosis not present

## 2016-10-31 DIAGNOSIS — R928 Other abnormal and inconclusive findings on diagnostic imaging of breast: Secondary | ICD-10-CM | POA: Diagnosis not present

## 2016-10-31 DIAGNOSIS — G959 Disease of spinal cord, unspecified: Secondary | ICD-10-CM | POA: Diagnosis not present

## 2016-11-01 DIAGNOSIS — J301 Allergic rhinitis due to pollen: Secondary | ICD-10-CM | POA: Diagnosis not present

## 2016-11-01 DIAGNOSIS — J3089 Other allergic rhinitis: Secondary | ICD-10-CM | POA: Diagnosis not present

## 2016-11-08 DIAGNOSIS — J301 Allergic rhinitis due to pollen: Secondary | ICD-10-CM | POA: Diagnosis not present

## 2016-11-08 DIAGNOSIS — J3089 Other allergic rhinitis: Secondary | ICD-10-CM | POA: Diagnosis not present

## 2016-11-11 IMAGING — CT CT HEAD W/O CM
3 series · 14 of 44 positions shown, 16 images · non-contrast
Comparison: None.

CLINICAL DATA: Cluster headaches, history of thyroid carcinoma

EXAM:
CT HEAD WITHOUT CONTRAST
TECHNIQUE: Contiguous axial images were obtained from the base of the skull
through the vertex without intravenous contrast.

[Series 2: ax head wo · axial · 0.39mm/px · z∈[-75,+34]mm · 8 of 27 slices shown, 10 images]
[im 3/27  brain]
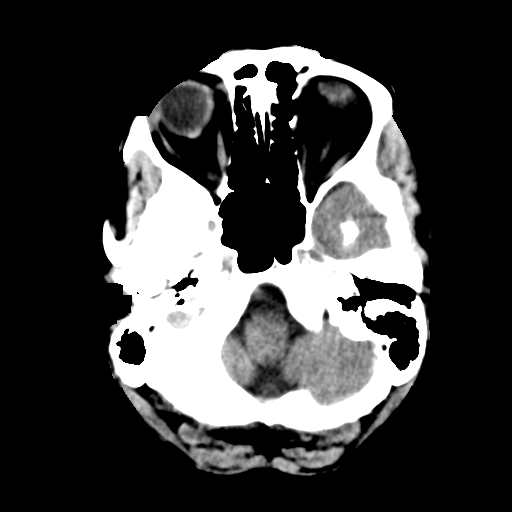
[im 3/27  bone]
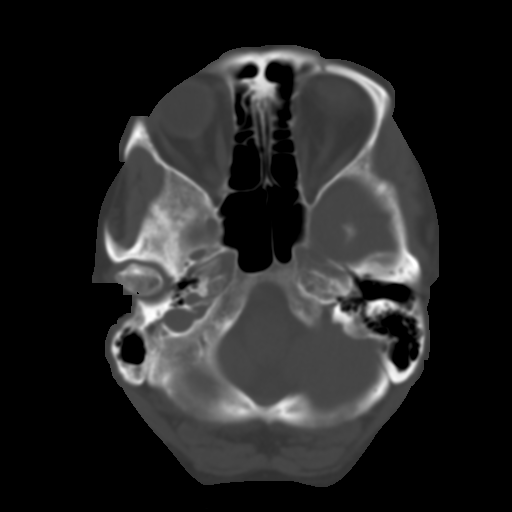
[im 6/27  brain]
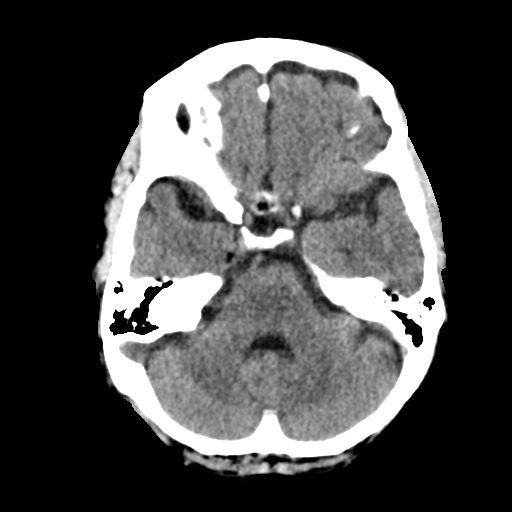
[im 9/27  brain]
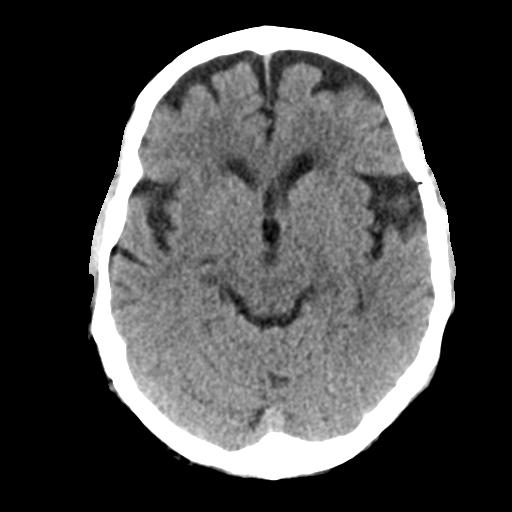
[im 12/27  brain]
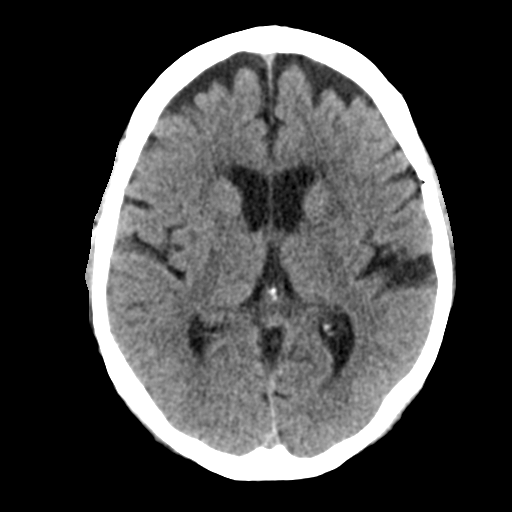
[im 16/27  brain]
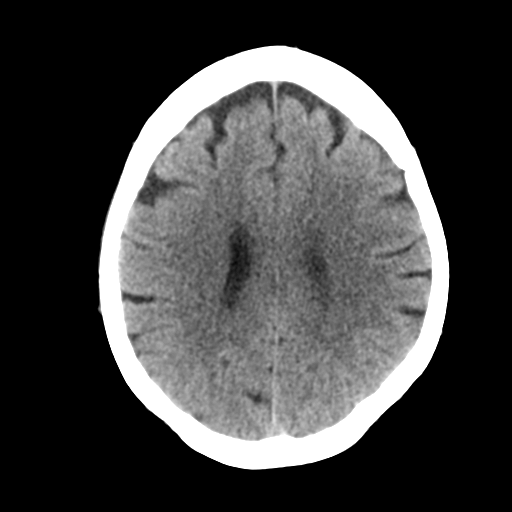
[im 16/27  bone]
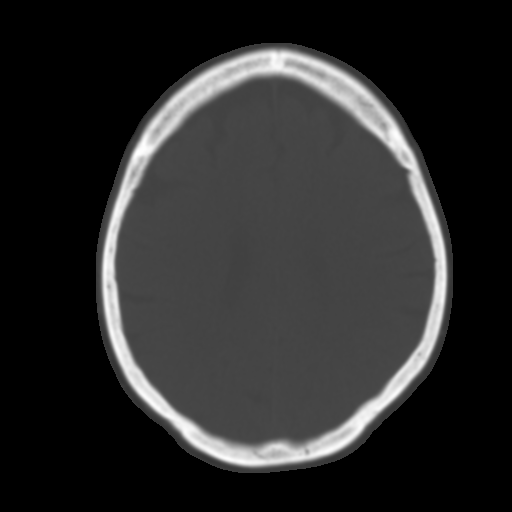
[im 19/27  brain]
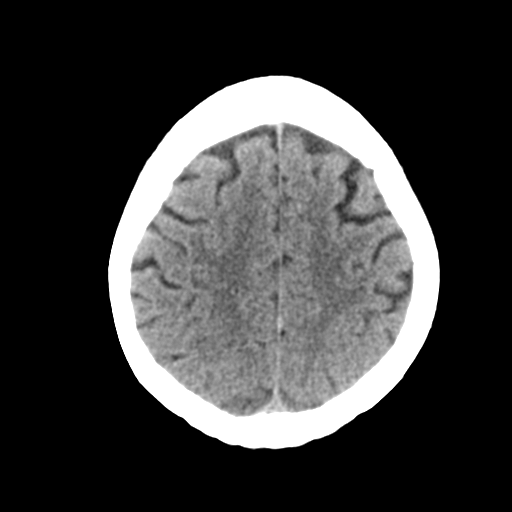
[im 22/27  brain]
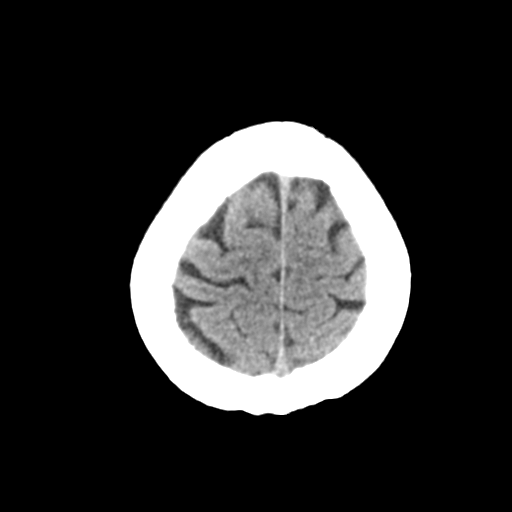
[im 25/27  brain]
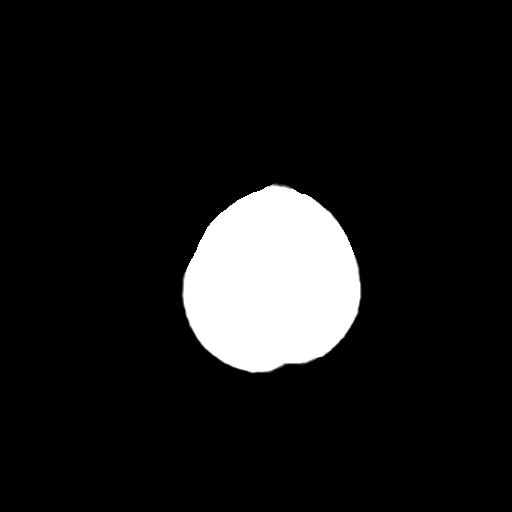

[Series 5: coronal soft tissue · coronal · 0.28mm/px · 3 of 64 slices shown]
[im 22/64  brain]
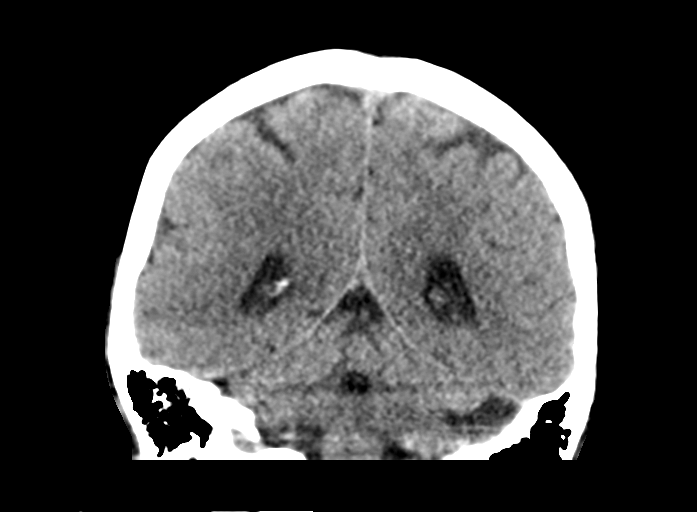
[im 29/64  brain]
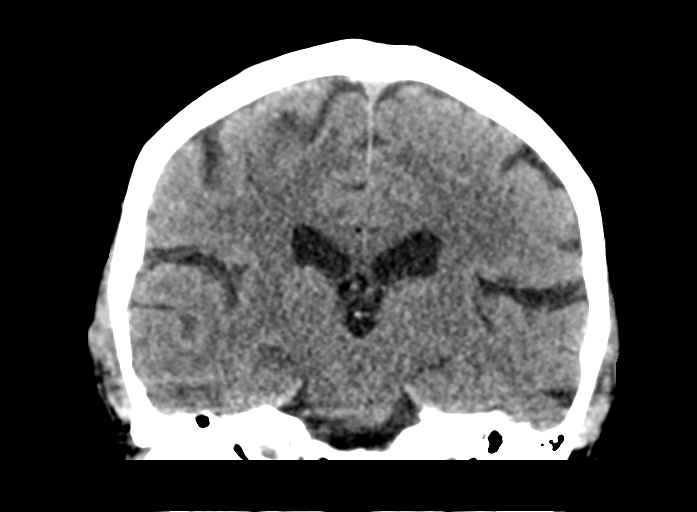
[im 36/64  brain]
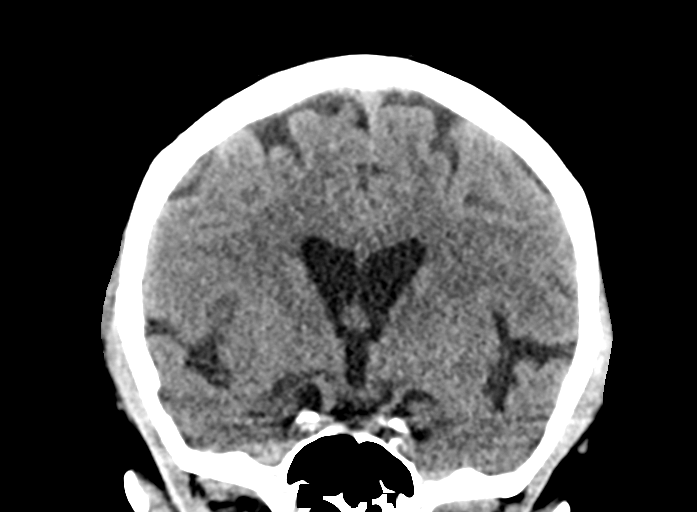

[Series 6: sagittal soft tissue · sagittal · 0.27mm/px · 3 of 52 slices shown]
[im 18/52  brain]
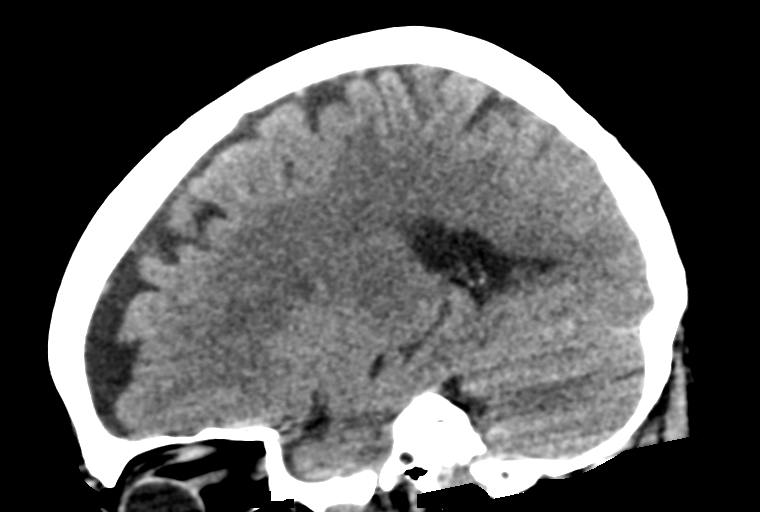
[im 26/52  brain]
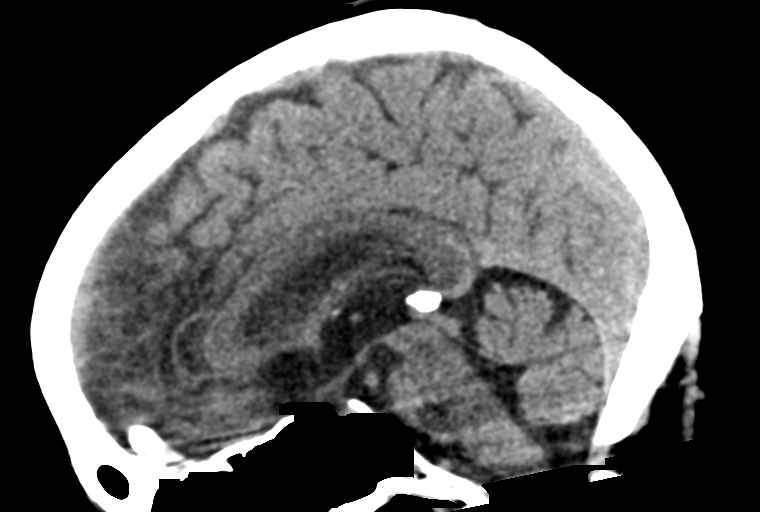
[im 35/52  brain]
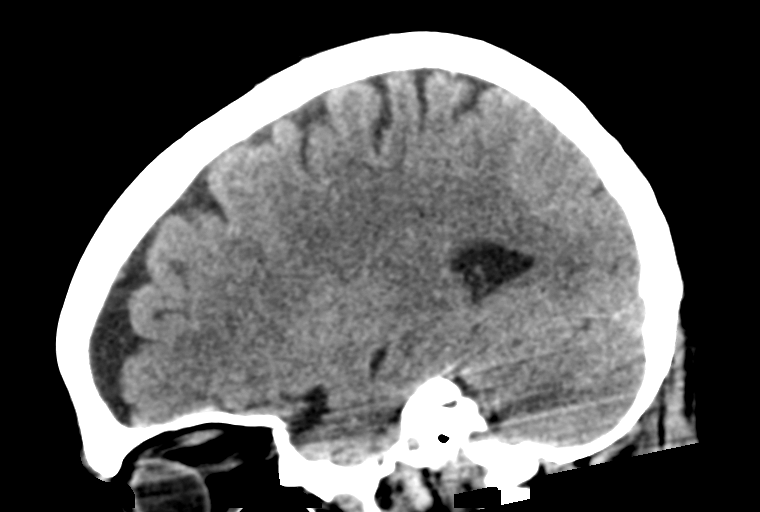

[14 of 44 positions shown; findings below may reference images not displayed]

FINDINGS: The ventricular system is minimally prominent as are the cortical
sulci diffusely consistent with atrophy. The septum is midline in
position. The fourth ventricle and basilar cisterns are
unremarkable. No hemorrhage, mass lesion, or acute infarction is
seen. Mild changes of small vessel ischemic disease are noted in the
periventricular white matter. On bone window images, no calvarial
abnormality is seen. The paranasal sinuses are pneumatized.
IMPRESSION: Mild small vessel ischemic change. Mild atrophy. No acute
intracranial abnormality.

## 2016-11-16 DIAGNOSIS — M5416 Radiculopathy, lumbar region: Secondary | ICD-10-CM | POA: Diagnosis not present

## 2016-11-16 DIAGNOSIS — M5136 Other intervertebral disc degeneration, lumbar region: Secondary | ICD-10-CM | POA: Diagnosis not present

## 2016-11-17 DIAGNOSIS — J3089 Other allergic rhinitis: Secondary | ICD-10-CM | POA: Diagnosis not present

## 2016-11-17 DIAGNOSIS — J301 Allergic rhinitis due to pollen: Secondary | ICD-10-CM | POA: Diagnosis not present

## 2016-11-25 DIAGNOSIS — J3089 Other allergic rhinitis: Secondary | ICD-10-CM | POA: Diagnosis not present

## 2016-11-25 DIAGNOSIS — J301 Allergic rhinitis due to pollen: Secondary | ICD-10-CM | POA: Diagnosis not present

## 2016-11-25 DIAGNOSIS — J3081 Allergic rhinitis due to animal (cat) (dog) hair and dander: Secondary | ICD-10-CM | POA: Diagnosis not present

## 2016-11-29 DIAGNOSIS — J301 Allergic rhinitis due to pollen: Secondary | ICD-10-CM | POA: Diagnosis not present

## 2016-11-29 DIAGNOSIS — J3089 Other allergic rhinitis: Secondary | ICD-10-CM | POA: Diagnosis not present

## 2016-12-05 NOTE — Progress Notes (Signed)
12/06/2016 4:12 PM   [name] [birth year] [id number]  Referring provider: [name], MD [place] [place] [address] [place], [place] [address]  Chief Complaint  Patient presents with  . New Patient (Initial Visit)    OAB referred by Dr. Lavera Guise    HPI: Patient is a 71 -year-old Caucasian female who is referred to [place] by, Dr. Lavera Guise, for urinary frequency.    She is having associated urinary frequency x q 2 hours, strong urgency, dysuria, nocturia x 5, intermittency, hesitancy, straining to urinate and weak urinary stream.   She does have a history of urinary tract infections, STI's or injury to the bladder.   She denies gross hematuria, suprapubic pain, abdominal pain or flank pain.   She has not had any recent fevers, chills, nausea or vomiting.   She has a remote history of stones.  She has had a bladder tack in the 70's and bladder fulguration in the 80's.   She is post menopausal.   She admits to diarrhea.   She is not having pain with bladder filling.  She has not had any recent imaging studies.    She is drinking 3 bottles of water daily.   She is drinking 4 caffeinated beverages daily.  She is not drinking alcoholic beverages daily.    She is taking antihistamines, decongestants, benzo's, diuretics and antidepressants   Her PVR is 0 mL.  She has a history of IC.    PMH: Past Medical History:  Diagnosis Date  . Asthma    WELL CONTROLLED  . Bulging lumbar disc   . Cancer [facility]) 1999   thyroid with recurrent 2006  . Complication of anesthesia    HEART STOPPED DURING TONSILLECTOMY (AGE 89) DUE TO ALLERGY TO ETHER  . DDD (degenerative disc disease), lumbar   . DDD (degenerative disc disease), thoracolumbar   . Diverticulosis   . Family history of adverse reaction to anesthesia    PTS FATHER-UNSURE WHAT HAPPENED  . GERD (gastroesophageal reflux disease)   . Heart murmur   . Hepatitis    AGE 47 "VIRAL"  . Hypertension    OFF MEDS CURRENTLY PER PCP (MASOUD)  .  Hypothyroidism   . Interstitial cystitis   . PONV (postoperative nausea and vomiting)   . Thyroid disease     Surgical History: Past Surgical History:  Procedure Laterality Date  . BLADDER SURGERY  1984  . BREAST SURGERY  [id number]   mass removed  . BREAST SURGERY  April 2017   Dr [facility]  . CATARACT EXTRACTION  2011  . CHOLECYSTECTOMY N/A 11/30/2015   Procedure: LAPAROSCOPIC CHOLECYSTECTOMY WITH INTRAOPERATIVE CHOLANGIOGRAM;  Surgeon: Christene Lye, MD;  Location: [facility] ORS;  Service: General;  Laterality: N/A;  . COLONOSCOPY  2005, 2015  . DILATION AND CURETTAGE OF UTERUS  1975  . EXCISIONAL HEMORRHOIDECTOMY  1975  . EYE SURGERY Left 2011  . [place]  . HERNIA REPAIR  2015  . THYROIDECTOMY  1999  . TONSILLECTOMY  1952   AGE 89    Home Medications:  Allergies as of 12/06/2016      Reactions   Pollen Extract Other (See Comments)   Ether    "HEART STOPPED WHILE IN SURGERY"   Penicillins Hives   Band-aid Plus Antibiotic [bacitracin-polymyxin B] Rash      Medication List       Accurate as of 12/06/16  4:12 PM. Always use your most recent med list.  albuterol (2.5 MG/3ML) 0.083% nebulizer solution Commonly known as:  PROVENTIL Inhale into the lungs.   ALBUTEROL IN Inhale into the lungs as needed.   AMBIEN 10 MG tablet Generic drug:  zolpidem Take 10 mg by mouth at bedtime as needed for sleep.   aspirin 81 MG tablet Take 81 mg by mouth daily.   Calcium 500 MG Chew Chew by mouth.   celecoxib 200 MG capsule Commonly known as:  CELEBREX Take by mouth.   cetirizine 10 MG tablet Commonly known as:  ZYRTEC Take 10 mg by mouth daily.   clonazePAM 0.5 MG tablet Commonly known as:  KLONOPIN Take 0.5 mg by mouth 2 (two) times daily as needed for anxiety.   cycloSPORINE 0.05 % ophthalmic emulsion Commonly known as:  RESTASIS 1 drop 2 (two) times daily.   enalapril 10 MG tablet Commonly known as:  VASOTEC Take 10 mg by  mouth daily.   FLAXSEED OIL PO Take by mouth.   fluconazole 150 MG tablet Commonly known as:  DIFLUCAN Take by mouth.   FLUoxetine 40 MG capsule Commonly known as:  PROZAC Take 40 mg by mouth every morning.   fluticasone 50 MCG/ACT nasal spray Commonly known as:  FLONASE Place 1 spray into both nostrils daily.   Fluticasone-Salmeterol 250-50 MCG/DOSE Aepb Commonly known as:  ADVAIR Inhale 1 puff into the lungs 3 (three) times daily as needed.   furosemide 20 MG tablet Commonly known as:  LASIX Take 40 mg by mouth daily.   gabapentin 100 MG capsule Commonly known as:  NEURONTIN TAKE 2 CAPSULES BY MOUTH TWICE DAILY   Glucosamine-Chondroitin 500-400 MG Caps Take by mouth.   HYDROcodone-acetaminophen 7.5-325 MG tablet Commonly known as:  NORCO   hydrocortisone 25 MG suppository Commonly known as:  ANUSOL-HC Place 1 suppository (25 mg total) rectally 2 (two) times daily.   levothyroxine 150 MCG tablet Commonly known as:  SYNTHROID, LEVOTHROID Take 125 mcg by mouth daily before breakfast.   nitrofurantoin (macrocrystal-monohydrate) 100 MG capsule Commonly known as:  MACROBID Take 1 capsule (100 mg total) by mouth 2 (two) times daily.   olmesartan 20 MG tablet Commonly known as:  BENICAR Take 20 mg by mouth as needed (IF BP IS ELEVATED).   pantoprazole 40 MG tablet Commonly known as:  PROTONIX Take 1 tablet by mouth every morning.   potassium chloride SA 20 MEQ tablet Commonly known as:  K-DUR,KLOR-CON TK 1 T PO QD   PRESCRIPTION MEDICATION Allergy injections once weekly   raloxifene 60 MG tablet Commonly known as:  EVISTA   tiZANidine 4 MG capsule Commonly known as:  ZANAFLEX Take 4 mg by mouth as needed.   vitamin C 500 MG tablet Commonly known as:  ASCORBIC ACID Take by mouth.   Vitamin D-3 1000 units Caps Take by mouth daily.       Allergies:  Allergies  Allergen Reactions  . Pollen Extract Other (See Comments)  . Ether     "HEART  STOPPED WHILE IN SURGERY"  . Penicillins Hives  . Band-Aid Plus Antibiotic [Bacitracin-Polymyxin B] Rash    Family History: Family History  Problem Relation Age of Onset  . Heart disease Mother   . Cancer Father        colon  . Heart disease Brother   . Diabetes Maternal Aunt   . Asthma Paternal Aunt   . Bladder Cancer Maternal Aunt   . Ovarian cancer Neg Hx   . Kidney cancer Neg Hx   . Prostate  cancer Neg Hx     Social History:  reports that she quit smoking about 31 years ago. She has a 15.00 pack-year smoking history. She has never used smokeless tobacco. She reports that she does not drink alcohol or use drugs.  ROS: UROLOGY Frequent Urination?: Yes Hard to postpone urination?: Yes Burning/pain with urination?: Yes Get up at night to urinate?: Yes Leakage of urine?: No Urine stream starts and stops?: Yes Trouble starting stream?: Yes Do you have to strain to urinate?: Yes Blood in urine?: No Urinary tract infection?: Yes Sexually transmitted disease?: No Injury to kidneys or bladder?: Yes Painful intercourse?: No Weak stream?: No Currently pregnant?: Yes Vaginal bleeding?: No Last menstrual period?: 2000  Gastrointestinal Nausea?: No Vomiting?: No Indigestion/heartburn?: No Diarrhea?: Yes Constipation?: No  Constitutional Fever: Yes Night sweats?: Yes Weight loss?: Yes Fatigue?: Yes  Skin Skin rash/lesions?: No Itching?: Yes  Eyes Blurred vision?: No Double vision?: No  Ears/Nose/Throat Sore throat?: No Sinus problems?: Yes  Hematologic/Lymphatic Swollen glands?: No Easy bruising?: Yes  Cardiovascular Leg swelling?: Yes (y) Chest pain?: No  Respiratory Cough?: Yes Shortness of breath?: No  Endocrine Excessive thirst?: No  Musculoskeletal Back pain?: Yes Joint pain?: Yes  Neurological Headaches?: No Dizziness?: No  Psychologic Depression?: Yes Anxiety?: Yes  Physical Exam: BP (!) 158/80   Pulse 69   Ht 5\' 1"  (1.549 m)    Wt 164 lb (74.4 kg)   BMI 30.99 kg/m   Constitutional: Well nourished. Alert and oriented, No acute distress. HEENT: [place] AT, moist mucus membranes. Trachea midline, no masses. Cardiovascular: No clubbing, cyanosis, or edema. Respiratory: Normal respiratory effort, no increased work of breathing. GI: Abdomen is soft, non tender, non distended, no abdominal masses. Liver and spleen not palpable.  No hernias appreciated.  Stool sample for occult testing is not indicated.   GU: No CVA tenderness.  No bladder fullness or masses.  Atrophy external genitalia, normal pubic hair distribution, no lesions.  Normal urethral meatus, no lesions, no prolapse, no discharge.   No urethral masses, tenderness and/or tenderness. No bladder fullness, tenderness or masses. Pale vagina mucosa, poor estrogen effect, no discharge, no lesions, good pelvic support, no cystocele or rectocele noted.  No cervical motion tenderness.  Uterus is freely mobile and non-fixed.  No adnexal/parametria masses or tenderness noted.  Anus and perineum are without rashes or lesions.    Skin: No rashes, bruises or suspicious lesions. Lymph: No cervical or inguinal adenopathy. Neurologic: Grossly intact, no focal deficits, moving all 4 extremities. Psychiatric: Normal mood and affect.  Laboratory Data: Lab Results  Component Value Date   WBC 8.0 06/03/2016   HGB 13.0 06/03/2016   HCT 37.6 06/03/2016   MCV 89.7 06/03/2016   PLT 303 06/03/2016    Lab Results  Component Value Date   CREATININE 1.04 (H) 06/03/2016    Lab Results  Component Value Date   AST 20 07/08/2014   Lab Results  Component Value Date   ALT 39 07/08/2014     Pertinent Imaging: Results for SHAIANN, MCMANAMON (MRN [id number]) as of 12/06/2016 15:52  Ref. Range 12/06/2016 15:34  Scan Result Unknown 0    Assessment & Plan:    1. Frequency  - offered behavioral therapies, bladder training, bladder control strategies and pelvic floor muscle training  - patient deferred  - fluid management - encouraged to decrease coffee intake  - offered medical therapy with anticholinergic therapy or beta-3 adrenergic receptor agonist and the potential side effects of each therapy   -  would like to try the beta-3 adrenergic receptor agonist (Myrbetriq).  Given Myrbetriq 25 mg samples, #28.  I have reviewed with the patient of the side effects of Myrbetriq, such as: elevation in BP, urinary retention and/or HA.  She will return in one month for PVR and symptom recheck.    - RTC in 3 weeks for PVR and symptom recheck   2. Nocturia  - may be due to IC  - will reassess in 3 weeks after the trial with Myrbetriq  3. Vaginal atrophy  - Not a candidate for vaginal estrogen cream due to her history of breast cancer  - Advised to use olive oil Coconut oil for discomfort   Return in about 3 weeks (around 12/27/2016) for PVR and OAB questionnaire.  These notes generated with voice recognition software. I apologize for typographical errors.  Zara Council, [facility] [address], [place], [place] [address] [phone]

## 2016-12-06 ENCOUNTER — Ambulatory Visit (INDEPENDENT_AMBULATORY_CARE_PROVIDER_SITE_OTHER): Payer: Medicare Other | Admitting: Urology

## 2016-12-06 ENCOUNTER — Encounter: Payer: Self-pay | Admitting: Urology

## 2016-12-06 VITALS — BP 158/80 | HR 69 | Ht 61.0 in | Wt 164.0 lb

## 2016-12-06 DIAGNOSIS — R350 Frequency of micturition: Secondary | ICD-10-CM | POA: Diagnosis not present

## 2016-12-06 DIAGNOSIS — N3281 Overactive bladder: Secondary | ICD-10-CM | POA: Diagnosis not present

## 2016-12-06 DIAGNOSIS — N952 Postmenopausal atrophic vaginitis: Secondary | ICD-10-CM

## 2016-12-06 DIAGNOSIS — R351 Nocturia: Secondary | ICD-10-CM

## 2016-12-06 LAB — BLADDER SCAN AMB NON-IMAGING: SCAN RESULT: 0

## 2016-12-08 DIAGNOSIS — J301 Allergic rhinitis due to pollen: Secondary | ICD-10-CM | POA: Diagnosis not present

## 2016-12-08 DIAGNOSIS — J3089 Other allergic rhinitis: Secondary | ICD-10-CM | POA: Diagnosis not present

## 2016-12-12 DIAGNOSIS — J311 Chronic nasopharyngitis: Secondary | ICD-10-CM | POA: Diagnosis not present

## 2016-12-12 DIAGNOSIS — N3010 Interstitial cystitis (chronic) without hematuria: Secondary | ICD-10-CM | POA: Diagnosis not present

## 2016-12-12 DIAGNOSIS — E669 Obesity, unspecified: Secondary | ICD-10-CM | POA: Diagnosis not present

## 2016-12-12 DIAGNOSIS — E034 Atrophy of thyroid (acquired): Secondary | ICD-10-CM | POA: Diagnosis not present

## 2016-12-12 DIAGNOSIS — R079 Chest pain, unspecified: Secondary | ICD-10-CM | POA: Diagnosis not present

## 2016-12-12 DIAGNOSIS — I10 Essential (primary) hypertension: Secondary | ICD-10-CM | POA: Diagnosis not present

## 2016-12-12 DIAGNOSIS — J0110 Acute frontal sinusitis, unspecified: Secondary | ICD-10-CM | POA: Diagnosis not present

## 2016-12-12 DIAGNOSIS — R5381 Other malaise: Secondary | ICD-10-CM | POA: Diagnosis not present

## 2016-12-13 DIAGNOSIS — J301 Allergic rhinitis due to pollen: Secondary | ICD-10-CM | POA: Diagnosis not present

## 2016-12-13 DIAGNOSIS — J3089 Other allergic rhinitis: Secondary | ICD-10-CM | POA: Diagnosis not present

## 2016-12-22 DIAGNOSIS — J311 Chronic nasopharyngitis: Secondary | ICD-10-CM | POA: Diagnosis not present

## 2016-12-22 DIAGNOSIS — J301 Allergic rhinitis due to pollen: Secondary | ICD-10-CM | POA: Diagnosis not present

## 2016-12-22 DIAGNOSIS — J0110 Acute frontal sinusitis, unspecified: Secondary | ICD-10-CM | POA: Diagnosis not present

## 2016-12-22 DIAGNOSIS — N3010 Interstitial cystitis (chronic) without hematuria: Secondary | ICD-10-CM | POA: Diagnosis not present

## 2016-12-22 DIAGNOSIS — J3089 Other allergic rhinitis: Secondary | ICD-10-CM | POA: Diagnosis not present

## 2016-12-27 ENCOUNTER — Other Ambulatory Visit: Payer: Medicare Other

## 2016-12-27 ENCOUNTER — Telehealth: Payer: Self-pay

## 2016-12-27 DIAGNOSIS — R300 Dysuria: Secondary | ICD-10-CM | POA: Diagnosis not present

## 2016-12-27 DIAGNOSIS — J3089 Other allergic rhinitis: Secondary | ICD-10-CM | POA: Diagnosis not present

## 2016-12-27 DIAGNOSIS — J301 Allergic rhinitis due to pollen: Secondary | ICD-10-CM | POA: Diagnosis not present

## 2016-12-27 LAB — URINALYSIS, COMPLETE
BILIRUBIN UA: NEGATIVE
Glucose, UA: NEGATIVE
Ketones, UA: NEGATIVE
Nitrite, UA: NEGATIVE
PROTEIN UA: NEGATIVE
RBC UA: NEGATIVE
Specific Gravity, UA: 1.01 (ref 1.005–1.030)
Urobilinogen, Ur: 0.2 mg/dL (ref 0.2–1.0)
pH, UA: 5 (ref 5.0–7.5)

## 2016-12-27 LAB — MICROSCOPIC EXAMINATION: RBC, UA: NONE SEEN /hpf (ref 0–?)

## 2016-12-27 NOTE — Telephone Encounter (Signed)
Pt called in c/o dysuria. Has an appt w/ Shannon-PA on 06/07. Pt c/o of severe discomfort. Advised pt to come in to provide urine sample per [facility]. Pt verbalized understanding and added to lab schedule.

## 2016-12-27 NOTE — Progress Notes (Signed)
12/29/2016 3:04 PM   [name] 01/04/1946 [id number]  Referring provider: [name], MD [place], [place] [address]  Chief Complaint  Patient presents with  . Urinary Frequency    3 week follow up  . Vaginal Atrophy    HPI: 71 yo WF who presents today for a 3 week follow up after a trial of Myrbetriq for frequency.  Background history Patient is a 40 -year-old Caucasian female who is referred to [place] by, Dr. Lavera Guise, for urinary frequency.   She is having associated urinary frequency x q 2 hours, strong urgency, dysuria, nocturia x 5, intermittency, hesitancy, straining to urinate and weak urinary stream.   She does have a history of urinary tract infections, STI's or injury to the bladder.   She denies gross hematuria, suprapubic pain, abdominal pain or flank pain.   She has not had any recent fevers, chills, nausea or vomiting.   She has a remote history of stones.  She has had a bladder tack in the 70's and bladder fulguration in the 80's with Dr. Madelin Headings.  She is post menopausal.   She admits to diarrhea.  She is not having pain with bladder filling.  She has not had any recent imaging studies.  She is drinking 3 bottles of water daily.   She is drinking 4 caffeinated beverages daily.  She is not drinking alcoholic beverages daily.  She is taking antihistamines, decongestants, benzo's, diuretics and antidepressants   Her PVR is 0 mL.  She has a history of IC.    Two days ago, she contacted the office complaining of dysuria and severe suprapubic discomfort.  A UA that day was unremarkable.  Urine culture is pending, but the preliminary report shows mixed urogenital flora.    Today, she is experiencing urgency, nocturia, intermittency, hesitancy, straining to urinate and a weak urinary stream.  The patient has been experiencing urgency x 0-3 (stable), frequency x >8 (stable), is restricting fluids to avoid visits to the restroom, is engaging in toilet mapping,  incontinence x 0-3 (stable) and nocturia x 4-7 (stable).  Her PVR is 39 mL.    She states she is having bladder pain that is relieved with urination.  She was having relief with the Myrbetriq, but her symptoms returned two year ago.  She is not having gross hematuria.  She denies fevers, chills, nausea and vomiting.    PMH: Past Medical History:  Diagnosis Date  . Asthma    WELL CONTROLLED  . Bulging lumbar disc   . Cancer [facility]) 1999   thyroid with recurrent 2006  . Complication of anesthesia    HEART STOPPED DURING TONSILLECTOMY (AGE 24) DUE TO ALLERGY TO ETHER  . DDD (degenerative disc disease), lumbar   . DDD (degenerative disc disease), thoracolumbar   . Diverticulosis   . Family history of adverse reaction to anesthesia    PTS FATHER-UNSURE WHAT HAPPENED  . GERD (gastroesophageal reflux disease)   . Heart murmur   . Hepatitis    AGE 63 "VIRAL"  . Hypertension    OFF MEDS CURRENTLY PER PCP (MASOUD)  . Hypothyroidism   . Interstitial cystitis   . PONV (postoperative nausea and vomiting)   . Thyroid disease     Surgical History: Past Surgical History:  Procedure Laterality Date  . BLADDER SURGERY  1984  . BREAST SURGERY  [id number]   mass removed  . BREAST SURGERY  April 2017   Dr Jack Quarto [facility]  .  CATARACT EXTRACTION  2011  . CHOLECYSTECTOMY N/A 11/30/2015   Procedure: LAPAROSCOPIC CHOLECYSTECTOMY WITH INTRAOPERATIVE CHOLANGIOGRAM;  Surgeon: Christene Lye, MD;  Location: [facility] ORS;  Service: General;  Laterality: N/A;  . COLONOSCOPY  2005, 2015  . DILATION AND CURETTAGE OF UTERUS  1975  . EXCISIONAL HEMORRHOIDECTOMY  1975  . EYE SURGERY Left 2011  . [place]  . HERNIA REPAIR  2015  . THYROIDECTOMY  1999  . TONSILLECTOMY  1952   AGE 29    Home Medications:  Allergies as of 12/29/2016      Reactions   Pollen Extract Other (See Comments)   Ether    "HEART STOPPED WHILE IN SURGERY"   Gramineae Pollens Other (See Comments)   Penicillins Hives     Band-aid Plus Antibiotic [bacitracin-polymyxin B] Rash      Medication List       Accurate as of 12/29/16  3:04 PM. Always use your most recent med list.          albuterol (2.5 MG/3ML) 0.083% nebulizer solution Commonly known as:  PROVENTIL Inhale into the lungs.   ALBUTEROL IN Inhale into the lungs as needed.   AMBIEN 10 MG tablet Generic drug:  zolpidem Take 10 mg by mouth at bedtime as needed for sleep.   aspirin 81 MG tablet Take 81 mg by mouth daily.   Calcium 500 MG Chew Chew by mouth.   celecoxib 200 MG capsule Commonly known as:  CELEBREX Take by mouth.   cetirizine 10 MG tablet Commonly known as:  ZYRTEC Take 10 mg by mouth daily.   clonazePAM 0.5 MG tablet Commonly known as:  KLONOPIN Take 0.5 mg by mouth 2 (two) times daily as needed for anxiety.   cycloSPORINE 0.05 % ophthalmic emulsion Commonly known as:  RESTASIS 1 drop 2 (two) times daily.   enalapril 10 MG tablet Commonly known as:  VASOTEC Take 20 mg by mouth daily.   FLAXSEED OIL PO Take by mouth.   fluconazole 150 MG tablet Commonly known as:  DIFLUCAN Take by mouth.   FLUoxetine 40 MG capsule Commonly known as:  PROZAC Take 40 mg by mouth every morning.   fluticasone 50 MCG/ACT nasal spray Commonly known as:  FLONASE Place 1 spray into both nostrils daily.   Fluticasone-Salmeterol 250-50 MCG/DOSE Aepb Commonly known as:  ADVAIR Inhale 1 puff into the lungs 3 (three) times daily as needed.   furosemide 20 MG tablet Commonly known as:  LASIX Take 40 mg by mouth daily.   gabapentin 100 MG capsule Commonly known as:  NEURONTIN TAKE 2 CAPSULES BY MOUTH TWICE DAILY   Glucosamine-Chondroitin 500-400 MG Caps Take by mouth.   HYDROcodone-acetaminophen 7.5-325 MG tablet Commonly known as:  NORCO   hydrocortisone 25 MG suppository Commonly known as:  ANUSOL-HC Place 1 suppository (25 mg total) rectally 2 (two) times daily.   levothyroxine 150 MCG tablet Commonly known  as:  SYNTHROID, LEVOTHROID Take 100 mcg by mouth daily before breakfast.   nitrofurantoin (macrocrystal-monohydrate) 100 MG capsule Commonly known as:  MACROBID Take 1 capsule (100 mg total) by mouth 2 (two) times daily.   olmesartan 20 MG tablet Commonly known as:  BENICAR Take 20 mg by mouth as needed (IF BP IS ELEVATED).   pantoprazole 40 MG tablet Commonly known as:  PROTONIX Take 1 tablet by mouth every morning.   potassium chloride SA 20 MEQ tablet Commonly known as:  K-DUR,KLOR-CON TK 1 T PO QD   PRESCRIPTION MEDICATION  Allergy injections once weekly   raloxifene 60 MG tablet Commonly known as:  EVISTA   tiZANidine 4 MG capsule Commonly known as:  ZANAFLEX Take 4 mg by mouth as needed.   vitamin C 500 MG tablet Commonly known as:  ASCORBIC ACID Take by mouth.   Vitamin D-3 1000 units Caps Take by mouth daily.       Allergies:  Allergies  Allergen Reactions  . Pollen Extract Other (See Comments)  . Ether     "HEART STOPPED WHILE IN SURGERY"  . Gramineae Pollens Other (See Comments)  . Penicillins Hives  . Band-Aid Plus Antibiotic [Bacitracin-Polymyxin B] Rash    Family History: Family History  Problem Relation Age of Onset  . Heart disease Mother   . Cancer Father        colon  . Heart disease Brother   . Diabetes Maternal Aunt   . Asthma Paternal Aunt   . Bladder Cancer Maternal Aunt   . Ovarian cancer Neg Hx   . Kidney cancer Neg Hx   . Prostate cancer Neg Hx     Social History:  reports that she quit smoking about 31 years ago. She has a 15.00 pack-year smoking history. She has never used smokeless tobacco. She reports that she does not drink alcohol or use drugs.  ROS: UROLOGY Frequent Urination?: No Hard to postpone urination?: Yes Burning/pain with urination?: No Get up at night to urinate?: Yes Leakage of urine?: No Urine stream starts and stops?: Yes Trouble starting stream?: Yes Do you have to strain to urinate?: Yes Blood in  urine?: No Urinary tract infection?: No Sexually transmitted disease?: No Injury to kidneys or bladder?: No Painful intercourse?: No Weak stream?: Yes Currently pregnant?: No Vaginal bleeding?: No Last menstrual period?: n  Gastrointestinal Nausea?: No Vomiting?: No Indigestion/heartburn?: No Diarrhea?: No Constipation?: No  Constitutional Fever: No Night sweats?: No Weight loss?: Yes Fatigue?: No  Skin Skin rash/lesions?: No Itching?: Yes  Eyes Blurred vision?: No Double vision?: No  Ears/Nose/Throat Sore throat?: No Sinus problems?: Yes  Hematologic/Lymphatic Swollen glands?: No Easy bruising?: No  Cardiovascular Leg swelling?: No Chest pain?: No  Respiratory Cough?: No Shortness of breath?: No  Endocrine Excessive thirst?: No  Musculoskeletal Back pain?: Yes Joint pain?: Yes  Neurological Headaches?: No Dizziness?: No  Psychologic Depression?: No Anxiety?: Yes  Physical Exam: BP (!) 156/77   Pulse 76   Ht 5\' 1"  (1.549 m)   Wt 162 lb 9.6 oz (73.8 kg)   BMI 30.72 kg/m   Constitutional: Well nourished. Alert and oriented, No acute distress. HEENT: [place] AT, moist mucus membranes. Trachea midline, no masses. Cardiovascular: No clubbing, cyanosis, or edema. Respiratory: Normal respiratory effort, no increased work of breathing. Skin: No rashes, bruises or suspicious lesions. Lymph: No cervical or inguinal adenopathy. Neurologic: Grossly intact, no focal deficits, moving all 4 extremities. Psychiatric: Normal mood and affect.  Laboratory Data: Lab Results  Component Value Date   WBC 8.0 06/03/2016   HGB 13.0 06/03/2016   HCT 37.6 06/03/2016   MCV 89.7 06/03/2016   PLT 303 06/03/2016    Lab Results  Component Value Date   CREATININE 1.04 (H) 06/03/2016    Lab Results  Component Value Date   AST 20 07/08/2014   Lab Results  Component Value Date   ALT 39 07/08/2014     Pertinent Imaging: Results for DANASHA, MELMAN  (MRN [id number]) as of 12/29/2016 14:48  Ref. Range 12/29/2016 14:42  Scan Result Unknown 39  Assessment & Plan:    1. Frequency  - offered behavioral therapies, bladder training, bladder control strategies and pelvic floor muscle training - patient deferred  - fluid management - encouraged to decrease coffee intake  - would like to try to increase the Myrbetriq to 50 mg daily   - RTC in 3 weeks for PVR and symptom recheck   2. Nocturia  - may be due to IC - did get some what better with the Myrbetriq   - will reassess in 3 weeks after the trial with Myrbetriq  3. Vaginal atrophy  - Not a candidate for vaginal estrogen cream due to her history of breast cancer - will speak with her surgeon about starting vaginal estrogen cream  - Advised to use olive oil Coconut oil for discomfort  4. Suprapubic pain  - schedule cystoscopy to [facility]    Return for cystoscopy if urine culture is negative.  These notes generated with voice recognition software. I apologize for typographical errors.  Zara Council, [facility] [address], [place], [place] [address] [phone]

## 2016-12-27 NOTE — Progress Notes (Unsigned)
Patient notified per Larene Beach that UA is clear, we will send for culture due to dysuria and will call with results. Ok to use AZO OTC and increase water intake.

## 2016-12-29 ENCOUNTER — Encounter: Payer: Self-pay | Admitting: Urology

## 2016-12-29 ENCOUNTER — Ambulatory Visit (INDEPENDENT_AMBULATORY_CARE_PROVIDER_SITE_OTHER): Payer: Medicare Other | Admitting: Urology

## 2016-12-29 VITALS — BP 156/77 | HR 76 | Ht 61.0 in | Wt 162.6 lb

## 2016-12-29 DIAGNOSIS — J3089 Other allergic rhinitis: Secondary | ICD-10-CM | POA: Diagnosis not present

## 2016-12-29 DIAGNOSIS — N952 Postmenopausal atrophic vaginitis: Secondary | ICD-10-CM

## 2016-12-29 DIAGNOSIS — R350 Frequency of micturition: Secondary | ICD-10-CM

## 2016-12-29 DIAGNOSIS — R351 Nocturia: Secondary | ICD-10-CM | POA: Diagnosis not present

## 2016-12-29 DIAGNOSIS — R102 Pelvic and perineal pain: Secondary | ICD-10-CM

## 2016-12-29 DIAGNOSIS — J301 Allergic rhinitis due to pollen: Secondary | ICD-10-CM | POA: Diagnosis not present

## 2016-12-29 LAB — BLADDER SCAN AMB NON-IMAGING: Scan Result: 39

## 2016-12-29 NOTE — Patient Instructions (Signed)
It is important that you drink 6 to 8 glasses of water daily.  Half of the fluid intake should be water. Drink most of your fluids before dinner. Don't drink too much liquid at once, sip instead of gulp.  There are certain beverages and foods that have known to the bladder irritants and they should be avoided or consumed infrequently. These include but not limited to beverages and foods containing caffeine, carbonated beverages, highly acidic or spicy foods, such as fruits, tomato products, artificial sweeteners and alcohol.  If you find it difficult to avoid these foods it may be helpful to alternate water in between consuming these foods.  It is important to stay well hydrated and eat 24 to 30 grams of fiber daily.    It has shown that even weight loss can help with urinary incontinence. In this study it demonstrated that and a percent weight loss and obese women (20 pound loss for a 250 pound woman) cut the number of incontinence episodes nearly in half.

## 2016-12-31 LAB — CULTURE, URINE COMPREHENSIVE

## 2017-01-05 ENCOUNTER — Other Ambulatory Visit: Payer: Medicare Other | Admitting: Urology

## 2017-01-05 DIAGNOSIS — J301 Allergic rhinitis due to pollen: Secondary | ICD-10-CM | POA: Diagnosis not present

## 2017-01-05 DIAGNOSIS — J3081 Allergic rhinitis due to animal (cat) (dog) hair and dander: Secondary | ICD-10-CM | POA: Diagnosis not present

## 2017-01-05 DIAGNOSIS — J3089 Other allergic rhinitis: Secondary | ICD-10-CM | POA: Diagnosis not present

## 2017-01-09 ENCOUNTER — Encounter: Payer: Self-pay | Admitting: Urology

## 2017-01-09 ENCOUNTER — Ambulatory Visit (INDEPENDENT_AMBULATORY_CARE_PROVIDER_SITE_OTHER): Payer: Medicare Other | Admitting: Urology

## 2017-01-09 VITALS — BP 165/86 | HR 67 | Ht 61.0 in | Wt 161.0 lb

## 2017-01-09 DIAGNOSIS — R350 Frequency of micturition: Secondary | ICD-10-CM | POA: Diagnosis not present

## 2017-01-09 LAB — MICROSCOPIC EXAMINATION: RBC, UA: NONE SEEN /hpf (ref 0–?)

## 2017-01-09 LAB — URINALYSIS, COMPLETE
Bilirubin, UA: NEGATIVE
Glucose, UA: NEGATIVE
Ketones, UA: NEGATIVE
Nitrite, UA: NEGATIVE
Protein, UA: NEGATIVE
RBC, UA: NEGATIVE
Specific Gravity, UA: 1.01 (ref 1.005–1.030)
Urobilinogen, Ur: 0.2 mg/dL (ref 0.2–1.0)
pH, UA: 7 (ref 5.0–7.5)

## 2017-01-09 MED ORDER — MIRABEGRON ER 25 MG PO TB24: 25.0000 mg | ORAL_TABLET | Freq: Every day | ORAL | 11 refills | Status: DC

## 2017-01-09 MED ORDER — CIPROFLOXACIN HCL 500 MG PO TABS
500.0000 mg | ORAL_TABLET | Freq: Once | ORAL | Status: AC
  Administered 2017-01-09: 500 mg via ORAL

## 2017-01-09 MED ORDER — LIDOCAINE HCL 2 % EX GEL
1.0000 "application " | Freq: Once | CUTANEOUS | Status: AC
  Administered 2017-01-09: 1 via URETHRAL

## 2017-01-09 NOTE — Progress Notes (Signed)
   01/09/17  CC: No chief complaint on file.   HPI: 71 year old female who presents today for office cystoscopy to rule out any underlying bladder pathology. She has a urinary frequency and history dysuria. She is a previous diagnosis of interstitial cystitis as well as "bladder polyps" managed by Dr. Ernst Spell and most recently Zara Council. She is improving with Mybetriq 25 mg. She's been struggling with yeast infections over the past week.  She is a personal history of breast cancer and is currently on an antiestrogen tablet.  No history of gross or microscopic hematuria.  Blood pressure (!) 165/86, pulse 67, height 5\' 1"  (1.549 m), weight 161 lb (73 kg). NED. A&Ox3.   No respiratory distress   Abd soft, NT, ND Normal external genitalia with patent urethral meatus  Cystoscopy Procedure Note  Patient identification was confirmed, informed consent was obtained, and patient was prepped using Betadine solution.  Lidocaine jelly was administered per urethral meatus.    Preoperative abx where received prior to procedure.    Procedure: - Flexible cystoscope introduced, without any difficulty.   - Thorough search of the bladder revealed:    normal urethral meatus    normal urothelium    no stones    no ulcers     no tumors    no urethral polyps    no trabeculation  - Ureteral orifices were normal in position and appearance.  Post-Procedure: - Patient tolerated the procedure well  Assessment/ Plan:  1. Urinary frequency No obvious bladder pathology or any suspicious lesions in bladder contributing to above symptoms.  Continue Mybetriq, prescription sent to pharmacy.  - Urinalysis, Complete - ciprofloxacin (CIPRO) tablet 500 mg; Take 1 tablet (500 mg total) by mouth once. - lidocaine (XYLOCAINE) 2 % jelly 1 application; Place 1 application into the urethra once.  Follow-up Larene Beach in 2 months.    Hollice Espy, MD

## 2017-01-10 ENCOUNTER — Telehealth: Payer: Self-pay | Admitting: Urology

## 2017-01-10 ENCOUNTER — Other Ambulatory Visit: Payer: Self-pay | Admitting: Urology

## 2017-01-10 MED ORDER — MIRABEGRON ER 50 MG PO TB24: 50.0000 mg | ORAL_TABLET | Freq: Every day | ORAL | 12 refills | Status: DC

## 2017-01-10 NOTE — Telephone Encounter (Signed)
Pt was given mybetriq samples of 50mg  by Larene Beach, pt states this works really good for her, however, pt was just seen in office for Cysto by Dr. Erlene Quan and was e-scribed 25mg  at her drug store.  Pt would like to get a Rx of the 50 mg sent to [facility]. Please advise. Thanks

## 2017-01-11 DIAGNOSIS — M5416 Radiculopathy, lumbar region: Secondary | ICD-10-CM | POA: Diagnosis not present

## 2017-01-11 DIAGNOSIS — M5136 Other intervertebral disc degeneration, lumbar region: Secondary | ICD-10-CM | POA: Diagnosis not present

## 2017-01-12 ENCOUNTER — Telehealth: Payer: Self-pay

## 2017-01-12 DIAGNOSIS — J3089 Other allergic rhinitis: Secondary | ICD-10-CM | POA: Diagnosis not present

## 2017-01-12 DIAGNOSIS — J301 Allergic rhinitis due to pollen: Secondary | ICD-10-CM | POA: Diagnosis not present

## 2017-01-12 NOTE — Telephone Encounter (Signed)
Submitted PA through CoverMyMeds: key/ [id number]. PA approved for 1year for Myrbetriq 50mg .  Called pt and notified of approval.

## 2017-01-17 DIAGNOSIS — J301 Allergic rhinitis due to pollen: Secondary | ICD-10-CM | POA: Diagnosis not present

## 2017-01-17 DIAGNOSIS — J3089 Other allergic rhinitis: Secondary | ICD-10-CM | POA: Diagnosis not present

## 2017-01-31 DIAGNOSIS — J329 Chronic sinusitis, unspecified: Secondary | ICD-10-CM | POA: Diagnosis not present

## 2017-01-31 DIAGNOSIS — R5381 Other malaise: Secondary | ICD-10-CM | POA: Diagnosis not present

## 2017-01-31 DIAGNOSIS — J3089 Other allergic rhinitis: Secondary | ICD-10-CM | POA: Diagnosis not present

## 2017-01-31 DIAGNOSIS — M353 Polymyalgia rheumatica: Secondary | ICD-10-CM | POA: Diagnosis not present

## 2017-01-31 DIAGNOSIS — J301 Allergic rhinitis due to pollen: Secondary | ICD-10-CM | POA: Diagnosis not present

## 2017-01-31 DIAGNOSIS — I10 Essential (primary) hypertension: Secondary | ICD-10-CM | POA: Diagnosis not present

## 2017-01-31 DIAGNOSIS — E038 Other specified hypothyroidism: Secondary | ICD-10-CM | POA: Diagnosis not present

## 2017-02-07 DIAGNOSIS — E038 Other specified hypothyroidism: Secondary | ICD-10-CM | POA: Diagnosis not present

## 2017-02-07 DIAGNOSIS — M353 Polymyalgia rheumatica: Secondary | ICD-10-CM | POA: Diagnosis not present

## 2017-02-07 DIAGNOSIS — N3020 Other chronic cystitis without hematuria: Secondary | ICD-10-CM | POA: Diagnosis not present

## 2017-02-07 DIAGNOSIS — J449 Chronic obstructive pulmonary disease, unspecified: Secondary | ICD-10-CM | POA: Diagnosis not present

## 2017-02-07 DIAGNOSIS — R5381 Other malaise: Secondary | ICD-10-CM | POA: Diagnosis not present

## 2017-02-13 DIAGNOSIS — K219 Gastro-esophageal reflux disease without esophagitis: Secondary | ICD-10-CM | POA: Diagnosis not present

## 2017-02-13 DIAGNOSIS — E038 Other specified hypothyroidism: Secondary | ICD-10-CM | POA: Diagnosis not present

## 2017-02-13 DIAGNOSIS — N3010 Interstitial cystitis (chronic) without hematuria: Secondary | ICD-10-CM | POA: Diagnosis not present

## 2017-02-13 DIAGNOSIS — N3020 Other chronic cystitis without hematuria: Secondary | ICD-10-CM | POA: Diagnosis not present

## 2017-02-14 DIAGNOSIS — J301 Allergic rhinitis due to pollen: Secondary | ICD-10-CM | POA: Diagnosis not present

## 2017-02-14 DIAGNOSIS — J3089 Other allergic rhinitis: Secondary | ICD-10-CM | POA: Diagnosis not present

## 2017-02-15 DIAGNOSIS — M5416 Radiculopathy, lumbar region: Secondary | ICD-10-CM | POA: Diagnosis not present

## 2017-02-15 DIAGNOSIS — M5136 Other intervertebral disc degeneration, lumbar region: Secondary | ICD-10-CM | POA: Diagnosis not present

## 2017-02-21 DIAGNOSIS — E038 Other specified hypothyroidism: Secondary | ICD-10-CM | POA: Diagnosis not present

## 2017-02-21 DIAGNOSIS — J3089 Other allergic rhinitis: Secondary | ICD-10-CM | POA: Diagnosis not present

## 2017-02-21 DIAGNOSIS — J301 Allergic rhinitis due to pollen: Secondary | ICD-10-CM | POA: Diagnosis not present

## 2017-02-21 DIAGNOSIS — N3020 Other chronic cystitis without hematuria: Secondary | ICD-10-CM | POA: Diagnosis not present

## 2017-02-21 DIAGNOSIS — J449 Chronic obstructive pulmonary disease, unspecified: Secondary | ICD-10-CM | POA: Diagnosis not present

## 2017-02-21 DIAGNOSIS — J311 Chronic nasopharyngitis: Secondary | ICD-10-CM | POA: Diagnosis not present

## 2017-03-06 ENCOUNTER — Emergency Department: Payer: Medicare Other

## 2017-03-06 ENCOUNTER — Encounter: Payer: Self-pay | Admitting: Emergency Medicine

## 2017-03-06 ENCOUNTER — Emergency Department
Admission: EM | Admit: 2017-03-06 | Discharge: 2017-03-06 | Disposition: A | Discharge status: Home / Self Care | Payer: Medicare Other | Attending: Emergency Medicine | Admitting: Emergency Medicine

## 2017-03-06 DIAGNOSIS — R079 Chest pain, unspecified: Secondary | ICD-10-CM

## 2017-03-06 DIAGNOSIS — R1012 Left upper quadrant pain: Secondary | ICD-10-CM | POA: Diagnosis not present

## 2017-03-06 DIAGNOSIS — Z79899 Other long term (current) drug therapy: Secondary | ICD-10-CM | POA: Insufficient documentation

## 2017-03-06 DIAGNOSIS — E039 Hypothyroidism, unspecified: Secondary | ICD-10-CM | POA: Diagnosis not present

## 2017-03-06 DIAGNOSIS — I10 Essential (primary) hypertension: Secondary | ICD-10-CM | POA: Insufficient documentation

## 2017-03-06 DIAGNOSIS — Z85850 Personal history of malignant neoplasm of thyroid: Secondary | ICD-10-CM | POA: Diagnosis not present

## 2017-03-06 DIAGNOSIS — Z87891 Personal history of nicotine dependence: Secondary | ICD-10-CM | POA: Diagnosis not present

## 2017-03-06 DIAGNOSIS — J45909 Unspecified asthma, uncomplicated: Secondary | ICD-10-CM | POA: Diagnosis not present

## 2017-03-06 LAB — CBC
HEMATOCRIT: 40.2 % (ref 35.0–47.0)
Hemoglobin: 13.5 g/dL (ref 12.0–16.0)
MCH: 30 pg (ref 26.0–34.0)
MCHC: 33.5 g/dL (ref 32.0–36.0)
MCV: 89.6 fL (ref 80.0–100.0)
Platelets: 320 10*3/uL (ref 150–440)
RBC: 4.49 MIL/uL (ref 3.80–5.20)
RDW: 14.3 % (ref 11.5–14.5)
WBC: 9.9 10*3/uL (ref 3.6–11.0)

## 2017-03-06 LAB — BASIC METABOLIC PANEL
Anion gap: 9 (ref 5–15)
BUN: 35 mg/dL — AB (ref 6–20)
CHLORIDE: 101 mmol/L (ref 101–111)
CO2: 25 mmol/L (ref 22–32)
Calcium: 9.5 mg/dL (ref 8.9–10.3)
Creatinine, Ser: 1.42 mg/dL — ABNORMAL HIGH (ref 0.44–1.00)
GFR calc non Af Amer: 36 mL/min — ABNORMAL LOW (ref >60)
GFR, EST AFRICAN AMERICAN: 42 mL/min — AB (ref >60)
Glucose, Bld: 112 mg/dL — ABNORMAL HIGH (ref 65–99)
POTASSIUM: 4.6 mmol/L (ref 3.5–5.1)
SODIUM: 135 mmol/L (ref 135–145)

## 2017-03-06 LAB — LIPASE, BLOOD: Lipase: 37 U/L (ref 11–51)

## 2017-03-06 LAB — TROPONIN I: Troponin I: <0.03 ng/mL (ref <0.03)

## 2017-03-06 NOTE — ED Provider Notes (Signed)
[facility] Emergency Department Provider Note  Time seen: 5:37 PM  I have reviewed the triage vital signs and the nursing notes.   HISTORY  Chief Complaint Chest Pain    HPI [name] is a 71 y.o. female With a past medical history of asthma, hypertension, presents to the emergency department for chest discomfort.according to the patient for the past one week she has been experiencing left-sided chest/upper abdominal discomfort. States it feels like there is something inside of her the need to come out.States it is somewhat worse if she bends over.Denies any cough. She has had a recent shortness of breath, was prescribed a prednisone taperher primary care doctor hich she is currently taking. Denies any nausea or diaphoresis. States that shortness breath has resolved. Denies any cough or fever. Denies any leg pain or swelling of her baseline. Currently the patient appears very well. States she went to go see her primary care doctor but he was out of the office due to a recent surgery. So she went to the walk-in clinic but they sent her to the emergency department for evaluation.  Past Medical History:  Diagnosis Date  . Asthma    WELL CONTROLLED  . Bulging lumbar disc   . Cancer [facility]) 1999   thyroid with recurrent 2006  . Complication of anesthesia    HEART STOPPED DURING TONSILLECTOMY (AGE 28) DUE TO ALLERGY TO ETHER  . DDD (degenerative disc disease), lumbar   . DDD (degenerative disc disease), thoracolumbar   . Diverticulosis   . Family history of adverse reaction to anesthesia    PTS FATHER-UNSURE WHAT HAPPENED  . GERD (gastroesophageal reflux disease)   . Heart murmur   . Hepatitis    AGE 53 "VIRAL"  . Hypertension    OFF MEDS CURRENTLY PER PCP (MASOUD)  . Hypothyroidism   . Interstitial cystitis   . PONV (postoperative nausea and vomiting)   . Thyroid disease     Patient Active Problem List   Diagnosis Date Noted  . Postmenopausal  bleeding 12/24/2015  . Hemorrhoid 12/24/2015  . Neoplasm of breast, primary tumor staging category Tis: lobular carcinoma in situ (LCIS) 12/24/2015  . Neuritis or radiculitis due to rupture of lumbar intervertebral disc 05/23/2014  . Degeneration of intervertebral disc of lumbar region 05/23/2014    Past Surgical History:  Procedure Laterality Date  . BLADDER SURGERY  1984  . BREAST SURGERY  7035,[id number]   mass removed  . BREAST SURGERY  April 2017   Dr Jack Quarto [facility]  . CATARACT EXTRACTION  2011  . CHOLECYSTECTOMY N/A 11/30/2015   Procedure: LAPAROSCOPIC CHOLECYSTECTOMY WITH INTRAOPERATIVE CHOLANGIOGRAM;  Surgeon: Christene Lye, MD;  Location: [facility] ORS;  Service: General;  Laterality: N/A;  . COLONOSCOPY  2005, 2015  . DILATION AND CURETTAGE OF UTERUS  1975  . EXCISIONAL HEMORRHOIDECTOMY  1975  . EYE SURGERY Left 2011  . [place]  . HERNIA REPAIR  2015  . THYROIDECTOMY  1999  . TONSILLECTOMY  1952   AGE 28    Prior to Admission medications   Medication Sig Start Date End Date Taking? Authorizing Provider  albuterol (PROVENTIL) (2.5 MG/3ML) 0.083% nebulizer solution Inhale into the lungs.    [provider]  ALBUTEROL IN Inhale into the lungs as needed.    [provider]  aspirin 81 MG tablet Take 81 mg by mouth daily.    [provider]  Calcium 500 MG CHEW Chew by mouth.  [provider]  celecoxib (CELEBREX) 200 MG capsule Take by mouth.    [provider]  cetirizine (ZYRTEC) 10 MG tablet Take 10 mg by mouth daily.    [provider]  Cholecalciferol (VITAMIN D-3) 1000 units CAPS Take by mouth daily.    [provider]  clonazePAM (KLONOPIN) 0.5 MG tablet Take 0.5 mg by mouth 2 (two) times daily as needed for anxiety.    [provider]  cycloSPORINE (RESTASIS) 0.05 % ophthalmic emulsion 1 drop 2 (two) times daily.    [provider]  enalapril (VASOTEC) 10 MG tablet Take 20  mg by mouth daily.     [provider]  Flaxseed, Linseed, (FLAXSEED OIL PO) Take by mouth.    [provider]  fluconazole (DIFLUCAN) 150 MG tablet Take by mouth. 09/11/16   [provider]  FLUoxetine (PROZAC) 40 MG capsule Take 40 mg by mouth every morning.    [provider]  fluticasone (FLONASE) 50 MCG/ACT nasal spray Place 1 spray into both nostrils daily.    [provider]  Fluticasone-Salmeterol (ADVAIR) 250-50 MCG/DOSE AEPB Inhale 1 puff into the lungs 3 (three) times daily as needed.     [provider]  furosemide (LASIX) 20 MG tablet Take 40 mg by mouth daily.     [provider]  gabapentin (NEURONTIN) 100 MG capsule TAKE 2 CAPSULES BY MOUTH TWICE DAILY 12/01/16   [provider]  Glucosamine-Chondroitin 500-400 MG CAPS Take by mouth.    [provider]  hydrocortisone (ANUSOL-HC) 25 MG suppository Place 1 suppository (25 mg total) rectally 2 (two) times daily. 12/24/15   Defrancesco, Alanda Slim, MD  levothyroxine (SYNTHROID, LEVOTHROID) 150 MCG tablet Take 100 mcg by mouth daily before breakfast.     [provider]  mirabegron ER (MYRBETRIQ) 50 MG TB24 tablet Take 1 tablet (50 mg total) by mouth daily. 01/10/17   McGowan, Hunt Oris, PA-C  nitrofurantoin, macrocrystal-monohydrate, (MACROBID) 100 MG capsule Take 1 capsule (100 mg total) by mouth 2 (two) times daily. 09/06/16   Defrancesco, Alanda Slim, MD  olmesartan (BENICAR) 20 MG tablet Take 20 mg by mouth as needed (IF BP IS ELEVATED).    [provider]  pantoprazole (PROTONIX) 40 MG tablet Take 1 tablet by mouth every morning.  04/25/14   [provider]  potassium chloride SA (K-DUR,KLOR-CON) 20 MEQ tablet TK 1 T PO QD 12/13/15   [provider]  PRESCRIPTION MEDICATION Allergy injections once weekly    [provider]  raloxifene (EVISTA) 60 MG tablet  12/07/15   [provider]  tiZANidine (ZANAFLEX) 4 MG  capsule Take 4 mg by mouth as needed.     [provider]  vitamin C (ASCORBIC ACID) 500 MG tablet Take by mouth.    [provider]  zolpidem (AMBIEN) 10 MG tablet Take 10 mg by mouth at bedtime as needed for sleep.    [provider]    Allergies  Allergen Reactions  . Pollen Extract Other (See Comments)  . Ether     "HEART STOPPED WHILE IN SURGERY"  . Gramineae Pollens Other (See Comments)  . Penicillins Hives  . Band-Aid Plus Antibiotic [Bacitracin-Polymyxin B] Rash    Family History  Problem Relation Age of Onset  . Heart disease Mother   . Cancer Father        colon  . Heart disease Brother   . Diabetes Maternal Aunt   . Asthma Paternal Aunt   .  Bladder Cancer Maternal Aunt   . Ovarian cancer Neg Hx   . Kidney cancer Neg Hx   . Prostate cancer Neg Hx     Social History Social History  Substance Use Topics  . Smoking status: Former Smoker    Packs/day: 1.00    Years: 15.00    Quit date: 11/25/1985  . Smokeless tobacco: Never Used  . Alcohol use No    Review of Systems Constitutional: Negative for fever. Cardiovascular: positive for left-sided chest discomfort/upper abdominal discomfort. Respiratory: Negative for shortness of breath. Gastrointestinal: positive for left upper quadrant discomfort/left chest discomfort. Negative for nausea vomiting or diarrhea. Neurological: Negative for headache All other ROS negative  ____________________________________________   PHYSICAL EXAM:  VITAL SIGNS: ED Triage Vitals  Enc Vitals Group     BP 03/06/17 1556 (!) 160/97     Pulse Rate 03/06/17 1556 66     Resp 03/06/17 1556 17     Temp 03/06/17 1556 98.2 F (36.8 C)     Temp Source 03/06/17 1556 Oral     SpO2 03/06/17 1556 98 %     Weight 03/06/17 1557 162 lb (73.5 kg)     Height 03/06/17 1557 5\' 1"  (1.549 m)     Head Circumference --      Peak Flow --      Pain Score 03/06/17 1556 5     Pain Loc --      Pain Edu? --      Excl. in  [place]? --     Constitutional: Alert and oriented. Well appearing and in no distress. Eyes: Normal exam ENT   Head: Normocephalic and atraumatic   Mouth/Throat: Mucous membranes are moist. Cardiovascular: Normal rate, regular rhythm.  Respiratory: Normal respiratory effort without tachypnea nor retractions. Breath sounds are clear and equal bilaterally. No wheezes/rales/rhonchi. Gastrointestinal: Soft and nontender. No distention.   Musculoskeletal: Nontender with normal range of motion in all extremities. No lower extremity tenderness or edema. Neurologic:  Normal speech and language. No gross focal neurologic deficits  Skin:  Skin is warm, dry and intact.  Psychiatric: Mood and affect are normal.   ____________________________________________    EKG  EKG reviewed and interpreted by myself shows normal sinus rhythm at 67 bpm, narrow QRS, normal axis, normal intervals, no ST changes.  ____________________________________________    RADIOLOGY  chest x-ray negative  ____________________________________________   INITIAL IMPRESSION / ASSESSMENT AND PLAN / ED COURSE  Pertinent labs & imaging results that were available during my care of the patient were reviewed by me and considered in my medical decision making (see chart for details).  Patient presents to the emergency department for 1 week of left-sided chest discomfort. [name] workup in the emergency department is largely negative. Labs including troponin and lipase are negative. Chest x-ray is normal. EKG is normal. Patient has a completely nontender exam. I discussed with the patient that it is not clear the cause of her chest pain however with a negative troponin and symptoms ongoing for 1 week I do not feel that it is cardiac related, at least not ACS. Chest x-ray is clear labs are normal. Her EKG is pristine I discussed the patient follow up with her primary care doctor to discuss whether not she requires another  stress test. I also discussed strict chest pain return precautions. Patient agreeable to plan.  ____________________________________________   FINAL CLINICAL IMPRESSION(S) / ED DIAGNOSES  chest pain    Harvest Dark, MD 03/06/17 1744

## 2017-03-06 NOTE — ED Triage Notes (Signed)
Pt reports centralized chest pain that radiates into back, also epigastric pain. States "I feel like something is moving around in there that needs to come out". Pt recently tx for bronchitis with doxycycline.

## 2017-03-06 NOTE — Discharge Instructions (Signed)
You have been seen in the emergency department today for chest pain. Your workup has shown normal results. As we discussed please follow-up with your primary care physician in the next 1-2 days for recheck. Return to the emergency department for any further chest pain, trouble breathing, or any other symptom personally concerning to yourself. °

## 2017-03-06 NOTE — ED Notes (Signed)
First Nurse: pt brought over for further evaluation of abd pain and high blood pressure.

## 2017-03-07 DIAGNOSIS — J3089 Other allergic rhinitis: Secondary | ICD-10-CM | POA: Diagnosis not present

## 2017-03-07 DIAGNOSIS — J301 Allergic rhinitis due to pollen: Secondary | ICD-10-CM | POA: Diagnosis not present

## 2017-03-07 NOTE — Progress Notes (Signed)
03/08/2017 8:40 AM   [name] 03-20-1946 [id number]  Referring provider: [name], MD [address] [place], [place] [address]  Chief Complaint  Patient presents with  . Urinary Frequency    HPI: 71 yo WF who presents today for a follow up.    Background history Patient is a 12 -year-old Caucasian female who is referred to [place] by, Dr. Lavera Guise, for urinary frequency.   She is having associated urinary frequency x q 2 hours, strong urgency, dysuria, nocturia x 5, intermittency, hesitancy, straining to urinate and weak urinary stream.   She does have a history of urinary tract infections, STI's or injury to the bladder.   She denies gross hematuria, suprapubic pain, abdominal pain or flank pain.   She has not had any recent fevers, chills, nausea or vomiting.   She has a remote history of stones.  She has had a bladder tack in the 70's and bladder fulguration in the 80's with Dr. Madelin Headings.  She is post menopausal.   She admits to diarrhea.  She is not having pain with bladder filling.  She has not had any recent imaging studies.  She is drinking 3 bottles of water daily.   She is drinking 4 caffeinated beverages daily.  She is not drinking alcoholic beverages daily.  She is taking antihistamines, decongestants, benzo's, diuretics and antidepressants   Her PVR is 0 mL.  She has a history of IC.    In 12/2016, she contacted the office complaining of dysuria and severe suprapubic discomfort.  A UA that day was unremarkable.  Urine culture shows mixed urogenital flora.   On 12/29/2016, she was experiencing urgency, nocturia, intermittency, hesitancy, straining to urinate and a weak urinary stream.  The patient has been experiencing urgency x 0-3 (stable), frequency x >8 (stable), is restricting fluids to avoid visits to the restroom, is engaging in toilet mapping, incontinence x 0-3 (stable) and nocturia x 4-7 (stable).  Her PVR was 39 mL.    She stated she is having bladder pain  that is relieved with urination.  She was having relief with the Myrbetriq, but her symptoms returned two days ago.  She is not having gross hematuria.  She denies fevers, chills, nausea and vomiting.    She underwent cystoscopy with Dr. Erlene Quan on 01/09/2017 and it was negative.  Today, she has been experiencing urgency x 4-7 (worse), frequency x 8 or more (stable), not restricting fluids to avoid visits to the restroom, is engaging in toilet mapping, incontinence x 0-3 (stable) and nocturia x 0-3 (improved).  Her PVR was 39 mL.  She has been having left sided flank pain recently and her serum creatinine has risen to 1.42 from 1.04 nine months ago.      Past Medical History:  Diagnosis Date  . Asthma    WELL CONTROLLED  . Bulging lumbar disc   . Cancer [facility]) 1999   thyroid with recurrent 2006  . Complication of anesthesia    HEART STOPPED DURING TONSILLECTOMY (AGE 38) DUE TO ALLERGY TO ETHER  . DDD (degenerative disc disease), lumbar   . DDD (degenerative disc disease), thoracolumbar   . Diverticulosis   . Family history of adverse reaction to anesthesia    PTS FATHER-UNSURE WHAT HAPPENED  . GERD (gastroesophageal reflux disease)   . Heart murmur   . Hepatitis    AGE 42 "VIRAL"  . Hypertension    OFF MEDS CURRENTLY PER PCP (MASOUD)  . Hypothyroidism   .  Interstitial cystitis   . PONV (postoperative nausea and vomiting)   . Thyroid disease     Surgical History: Past Surgical History:  Procedure Laterality Date  . BLADDER SURGERY  1984  . BREAST SURGERY  [id number]   mass removed  . BREAST SURGERY  April 2017   Dr Jack Quarto [facility]  . CATARACT EXTRACTION  2011  . CHOLECYSTECTOMY N/A 11/30/2015   Procedure: LAPAROSCOPIC CHOLECYSTECTOMY WITH INTRAOPERATIVE CHOLANGIOGRAM;  Surgeon: Christene Lye, MD;  Location: [facility] ORS;  Service: General;  Laterality: N/A;  . COLONOSCOPY  2005, 2015  . DILATION AND CURETTAGE OF UTERUS  1975  . EXCISIONAL HEMORRHOIDECTOMY  1975  . EYE  SURGERY Left 2011  . [place]  . HERNIA REPAIR  2015  . THYROIDECTOMY  1999  . TONSILLECTOMY  1952   AGE 80    Home Medications:  Allergies as of 03/08/2017      Reactions   Pollen Extract Other (See Comments)   Ether    "HEART STOPPED WHILE IN SURGERY"   Gramineae Pollens Other (See Comments)   Penicillins Hives   Band-aid Plus Antibiotic [bacitracin-polymyxin B] Rash      Medication List       Accurate as of 03/08/17 11:59 PM. Always use your most recent med list.          albuterol (2.5 MG/3ML) 0.083% nebulizer solution Commonly known as:  PROVENTIL Inhale into the lungs.   ALBUTEROL IN Inhale into the lungs as needed.   AMBIEN 10 MG tablet Generic drug:  zolpidem Take 10 mg by mouth at bedtime as needed for sleep.   aspirin 81 MG tablet Take 81 mg by mouth daily.   Calcium 500 MG Chew Chew by mouth.   celecoxib 200 MG capsule Commonly known as:  CELEBREX Take by mouth.   cetirizine 10 MG tablet Commonly known as:  ZYRTEC Take 10 mg by mouth daily.   clonazePAM 0.5 MG tablet Commonly known as:  KLONOPIN Take 0.5 mg by mouth 2 (two) times daily as needed for anxiety.   cycloSPORINE 0.05 % ophthalmic emulsion Commonly known as:  RESTASIS 1 drop 2 (two) times daily.   enalapril 10 MG tablet Commonly known as:  VASOTEC Take 20 mg by mouth daily.   FLAXSEED OIL PO Take by mouth.   fluconazole 150 MG tablet Commonly known as:  DIFLUCAN Take by mouth.   fluconazole 150 MG tablet Commonly known as:  DIFLUCAN Take 1 tablet (150 mg total) by mouth once.   FLUoxetine 40 MG capsule Commonly known as:  PROZAC Take 40 mg by mouth every morning.   fluticasone 50 MCG/ACT nasal spray Commonly known as:  FLONASE Place 1 spray into both nostrils daily.   Fluticasone-Salmeterol 250-50 MCG/DOSE Aepb Commonly known as:  ADVAIR Inhale 1 puff into the lungs 3 (three) times daily as needed.   furosemide 20 MG tablet Commonly known as:   LASIX Take 40 mg by mouth daily.   gabapentin 100 MG capsule Commonly known as:  NEURONTIN TAKE 2 CAPSULES BY MOUTH TWICE DAILY   Glucosamine-Chondroitin 500-400 MG Caps Take by mouth.   hydrocortisone 25 MG suppository Commonly known as:  ANUSOL-HC Place 1 suppository (25 mg total) rectally 2 (two) times daily.   levothyroxine 150 MCG tablet Commonly known as:  SYNTHROID, LEVOTHROID Take 100 mcg by mouth daily before breakfast.   mirabegron ER 50 MG Tb24 tablet Commonly known as:  MYRBETRIQ Take 1 tablet (50 mg total) by mouth  daily.   nitrofurantoin (macrocrystal-monohydrate) 100 MG capsule Commonly known as:  MACROBID Take 1 capsule (100 mg total) by mouth 2 (two) times daily.   olmesartan 20 MG tablet Commonly known as:  BENICAR Take 20 mg by mouth as needed (IF BP IS ELEVATED).   pantoprazole 40 MG tablet Commonly known as:  PROTONIX Take 1 tablet by mouth every morning.   potassium chloride SA 20 MEQ tablet Commonly known as:  K-DUR,KLOR-CON TK 1 T PO QD   predniSONE 5 MG tablet Commonly known as:  DELTASONE Take 5 mg by mouth daily with breakfast.   PRESCRIPTION MEDICATION Allergy injections once weekly   raloxifene 60 MG tablet Commonly known as:  EVISTA   tiZANidine 4 MG capsule Commonly known as:  ZANAFLEX Take 4 mg by mouth as needed.   vitamin C 500 MG tablet Commonly known as:  ASCORBIC ACID Take by mouth.   Vitamin D-3 1000 units Caps Take by mouth daily.       Allergies:  Allergies  Allergen Reactions  . Pollen Extract Other (See Comments)  . Ether     "HEART STOPPED WHILE IN SURGERY"  . Gramineae Pollens Other (See Comments)  . Penicillins Hives  . Band-Aid Plus Antibiotic [Bacitracin-Polymyxin B] Rash    Family History: Family History  Problem Relation Age of Onset  . Heart disease Mother   . Cancer Father        colon  . Heart disease Brother   . Diabetes Maternal Aunt   . Asthma Paternal Aunt   . Bladder Cancer  Maternal Aunt   . Ovarian cancer Neg Hx   . Kidney cancer Neg Hx   . Prostate cancer Neg Hx     Social History:  reports that she quit smoking about 31 years ago. She has a 15.00 pack-year smoking history. She has never used smokeless tobacco. She reports that she does not drink alcohol or use drugs.  ROS: UROLOGY Frequent Urination?: No Hard to postpone urination?: Yes Burning/pain with urination?: No Get up at night to urinate?: Yes Leakage of urine?: No Urine stream starts and stops?: Yes Trouble starting stream?: Yes Do you have to strain to urinate?: Yes Blood in urine?: No Urinary tract infection?: No Sexually transmitted disease?: No Injury to kidneys or bladder?: No Painful intercourse?: No Weak stream?: Yes Currently pregnant?: No Vaginal bleeding?: No Last menstrual period?: n  Gastrointestinal Nausea?: No Vomiting?: No Indigestion/heartburn?: No Diarrhea?: No Constipation?: No  Constitutional Fever: Yes Night sweats?: No Weight loss?: No Fatigue?: No  Skin Skin rash/lesions?: No Itching?: No  Eyes Blurred vision?: No Double vision?: No  Ears/Nose/Throat Sore throat?: No Sinus problems?: No  Hematologic/Lymphatic Swollen glands?: No Easy bruising?: No  Cardiovascular Leg swelling?: No Chest pain?: No  Respiratory Cough?: No Shortness of breath?: No  Endocrine Excessive thirst?: No  Musculoskeletal Back pain?: Yes Joint pain?: Yes  Neurological Headaches?: No Dizziness?: No  Psychologic Depression?: No Anxiety?: Yes  Physical Exam: BP 134/73 (BP Location: Left Arm, Patient Position: Sitting, Cuff Size: Normal)   Pulse 87   Ht 5\' 1"  (1.549 m)   Wt 167 lb 9.6 oz (76 kg)   BMI 31.67 kg/m   Constitutional: Well nourished. Alert and oriented, No acute distress. HEENT: [place] AT, moist mucus membranes. Trachea midline, no masses. Cardiovascular: No clubbing, cyanosis, or edema. Respiratory: Normal respiratory effort, no  increased work of breathing. GI: Abdomen is soft, non tender, non distended, no abdominal masses. Liver and spleen not palpable.  No hernias appreciated.  Stool sample for occult testing is not indicated.   GU: Mild left CVA tenderness.  No bladder fullness or masses.  Atrophic external genitalia, normal pubic hair distribution, no lesions.  Normal urethral meatus, no lesions, no prolapse, no discharge.   No urethral masses, tenderness and/or tenderness. No bladder fullness, tenderness or masses. Pale vagina mucosa, poor estrogen effect, discharge consistent with yeast present, no lesions, good pelvic support, no cystocele or rectocele noted.  No cervical motion tenderness.  Uterus is freely mobile and non-fixed.  No adnexal/parametria masses or tenderness noted.  Anus and perineum are without rashes or lesions.    Skin: No rashes, bruises or suspicious lesions. Lymph: No cervical or inguinal adenopathy. Neurologic: Grossly intact, no focal deficits, moving all 4 extremities. Psychiatric: Normal mood and affect.   Laboratory Data: Lab Results  Component Value Date   WBC 9.9 03/06/2017   HGB 13.5 03/06/2017   HCT 40.2 03/06/2017   MCV 89.6 03/06/2017   PLT 320 03/06/2017    Lab Results  Component Value Date   CREATININE 1.42 (H) 03/06/2017    I have reviewed the labs.  Pertinent Imaging: Results for JERI, JEANBAPTISTE (MRN [id number]) as of 03/08/2017 15:37  Ref. Range 03/08/2017 15:19  Scan Result Unknown 39   Assessment & Plan:    1. Frequency  - offered behavioral therapies, bladder training, bladder control strategies and pelvic floor muscle training - patient deferred  - fluid management - encouraged to decrease coffee intake  - feels Myrbetriq to 50 mg daily has provided relief  - RTC in 3 months with OAB questionnaire and PVR  2. Nocturia  - found relief with an increase in Myrbetriq   - RTC in 3 months for OAB questionnaire and PVR  3. Vaginal atrophy  - Not a  candidate for vaginal estrogen cream due to her history of breast cancer - will speak with her surgeon about starting vaginal estrogen cream  - Advised to use olive oil Coconut oil for discomfort  4. Suprapubic pain  - no CIS found on recent cystoscopy  5. Left flank pain  - patient with rising creatinine associated with left flank pain - will obtain a CT Renal stone study  - RTC in discuss CT results  6. Yeast infection  - patient with vaginal candidiasis  - Rx given for Diflucan   Return for CT Renal stone report.  These notes generated with voice recognition software. I apologize for typographical errors.  Zara Council, [facility] [address], [place], [place] [address] [phone]

## 2017-03-08 ENCOUNTER — Ambulatory Visit (INDEPENDENT_AMBULATORY_CARE_PROVIDER_SITE_OTHER): Payer: Medicare Other | Admitting: Urology

## 2017-03-08 ENCOUNTER — Encounter: Payer: Self-pay | Admitting: Urology

## 2017-03-08 VITALS — BP 134/73 | HR 87 | Ht 61.0 in | Wt 167.6 lb

## 2017-03-08 DIAGNOSIS — B373 Candidiasis of vulva and vagina: Secondary | ICD-10-CM | POA: Diagnosis not present

## 2017-03-08 DIAGNOSIS — R102 Pelvic and perineal pain: Secondary | ICD-10-CM

## 2017-03-08 DIAGNOSIS — N952 Postmenopausal atrophic vaginitis: Secondary | ICD-10-CM | POA: Diagnosis not present

## 2017-03-08 DIAGNOSIS — R351 Nocturia: Secondary | ICD-10-CM | POA: Diagnosis not present

## 2017-03-08 DIAGNOSIS — R1012 Left upper quadrant pain: Secondary | ICD-10-CM

## 2017-03-08 DIAGNOSIS — B3731 Acute candidiasis of vulva and vagina: Secondary | ICD-10-CM

## 2017-03-08 DIAGNOSIS — R109 Unspecified abdominal pain: Secondary | ICD-10-CM

## 2017-03-08 DIAGNOSIS — R350 Frequency of micturition: Secondary | ICD-10-CM

## 2017-03-08 LAB — BLADDER SCAN AMB NON-IMAGING: Scan Result: 39

## 2017-03-08 MED ORDER — FLUCONAZOLE 150 MG PO TABS: 150.0000 mg | ORAL_TABLET | Freq: Once | ORAL | 0 refills | Status: AC

## 2017-03-09 ENCOUNTER — Telehealth: Payer: Self-pay | Admitting: Urology

## 2017-03-09 NOTE — Telephone Encounter (Signed)
Please let [name] know that I couldn't get an ultrasound approved, but we could get a CT scan approved.

## 2017-03-10 ENCOUNTER — Telehealth: Payer: Self-pay

## 2017-03-10 NOTE — Telephone Encounter (Signed)
Pt called stating she is having a lot of pain in her spleen and spasms under her rib cage. Pt stated the imaging center told her orders can be added and when her CT is performed they can take images of the spleen also. Please advise.

## 2017-03-10 NOTE — Telephone Encounter (Signed)
Done ° ° °[name] °

## 2017-03-13 NOTE — Telephone Encounter (Signed)
Spoke with pt and made aware CT will cover spleen. Pt voiced understanding.

## 2017-03-13 NOTE — Telephone Encounter (Signed)
The CT scan will include the spleen.

## 2017-03-15 DIAGNOSIS — J449 Chronic obstructive pulmonary disease, unspecified: Secondary | ICD-10-CM | POA: Diagnosis not present

## 2017-03-15 DIAGNOSIS — N3020 Other chronic cystitis without hematuria: Secondary | ICD-10-CM | POA: Diagnosis not present

## 2017-03-15 DIAGNOSIS — K219 Gastro-esophageal reflux disease without esophagitis: Secondary | ICD-10-CM | POA: Diagnosis not present

## 2017-03-15 DIAGNOSIS — R091 Pleurisy: Secondary | ICD-10-CM | POA: Diagnosis not present

## 2017-03-16 ENCOUNTER — Ambulatory Visit
Admission: RE | Admit: 2017-03-16 | Discharge: 2017-03-16 | Disposition: A | Discharge status: Home / Self Care | Payer: Medicare Other | Source: Ambulatory Visit | Attending: Urology | Admitting: Urology

## 2017-03-16 DIAGNOSIS — K4020 Bilateral inguinal hernia, without obstruction or gangrene, not specified as recurrent: Secondary | ICD-10-CM | POA: Diagnosis not present

## 2017-03-16 DIAGNOSIS — N200 Calculus of kidney: Secondary | ICD-10-CM | POA: Insufficient documentation

## 2017-03-16 DIAGNOSIS — J9811 Atelectasis: Secondary | ICD-10-CM | POA: Diagnosis not present

## 2017-03-16 DIAGNOSIS — K5730 Diverticulosis of large intestine without perforation or abscess without bleeding: Secondary | ICD-10-CM | POA: Diagnosis not present

## 2017-03-16 DIAGNOSIS — J301 Allergic rhinitis due to pollen: Secondary | ICD-10-CM | POA: Diagnosis not present

## 2017-03-16 DIAGNOSIS — I700 Atherosclerosis of aorta: Secondary | ICD-10-CM | POA: Insufficient documentation

## 2017-03-16 DIAGNOSIS — J3081 Allergic rhinitis due to animal (cat) (dog) hair and dander: Secondary | ICD-10-CM | POA: Diagnosis not present

## 2017-03-16 DIAGNOSIS — I739 Peripheral vascular disease, unspecified: Secondary | ICD-10-CM | POA: Diagnosis not present

## 2017-03-16 DIAGNOSIS — R1012 Left upper quadrant pain: Secondary | ICD-10-CM | POA: Diagnosis not present

## 2017-03-16 DIAGNOSIS — R109 Unspecified abdominal pain: Secondary | ICD-10-CM

## 2017-03-16 DIAGNOSIS — J3089 Other allergic rhinitis: Secondary | ICD-10-CM | POA: Diagnosis not present

## 2017-03-23 DIAGNOSIS — K219 Gastro-esophageal reflux disease without esophagitis: Secondary | ICD-10-CM | POA: Diagnosis not present

## 2017-03-23 DIAGNOSIS — N3020 Other chronic cystitis without hematuria: Secondary | ICD-10-CM | POA: Diagnosis not present

## 2017-03-23 DIAGNOSIS — R091 Pleurisy: Secondary | ICD-10-CM | POA: Diagnosis not present

## 2017-03-23 DIAGNOSIS — J329 Chronic sinusitis, unspecified: Secondary | ICD-10-CM | POA: Diagnosis not present

## 2017-03-24 DIAGNOSIS — J301 Allergic rhinitis due to pollen: Secondary | ICD-10-CM | POA: Diagnosis not present

## 2017-03-24 DIAGNOSIS — J3089 Other allergic rhinitis: Secondary | ICD-10-CM | POA: Diagnosis not present

## 2017-03-25 NOTE — Progress Notes (Signed)
03/28/2017 3:06 PM   [name] 1946-07-13 [id number]  Referring provider: [name], MD [place], [place] [address]  Chief Complaint  Patient presents with  . Results    CT    HPI: 71 yo WF who presents today for a follow up.    Background history Patient is a 33 -year-old Caucasian female who is referred to [place] by, Dr. Lavera Guise, for urinary frequency.   She is having associated urinary frequency x q 2 hours, strong urgency, dysuria, nocturia x 5, intermittency, hesitancy, straining to urinate and weak urinary stream.   She does have a history of urinary tract infections, STI's or injury to the bladder.   She denies gross hematuria, suprapubic pain, abdominal pain or flank pain.   She has not had any recent fevers, chills, nausea or vomiting.   She has a remote history of stones.  She has had a bladder tack in the 70's and bladder fulguration in the 80's with Dr. Madelin Headings.  She is post menopausal.   She admits to diarrhea.  She is not having pain with bladder filling.  She has not had any recent imaging studies.  She is drinking 3 bottles of water daily.   She is drinking 4 caffeinated beverages daily.  She is not drinking alcoholic beverages daily.  She is taking antihistamines, decongestants, benzo's, diuretics and antidepressants   Her PVR is 0 mL.  She has a history of IC.    In 12/2016, she contacted the office complaining of dysuria and severe suprapubic discomfort.  A UA that day was unremarkable.  Urine culture shows mixed urogenital flora.   On 12/29/2016, she was experiencing urgency, nocturia, intermittency, hesitancy, straining to urinate and a weak urinary stream.  The patient has been experiencing urgency x 0-3 (stable), frequency x >8 (stable), is restricting fluids to avoid visits to the restroom, is engaging in toilet mapping, incontinence x 0-3 (stable) and nocturia x 4-7 (stable).  Her PVR was 39 mL.    She stated she is having bladder pain that  is relieved with urination.  She was having relief with the Myrbetriq, but her symptoms returned two days ago.  She is not having gross hematuria.  She denies fevers, chills, nausea and vomiting.    She underwent cystoscopy with Dr. Erlene Quan on 01/09/2017 and it was negative.  On 03/08/2017, she had been experiencing urgency x 4-7 (worse), frequency x 8 or more (stable), not restricting fluids to avoid visits to the restroom, is engaging in toilet mapping, incontinence x 0-3 (stable) and nocturia x 0-3 (improved).  Her PVR was 39 mL.  She had been having left sided flank pain recently and her serum creatinine has risen to 1.42 from 1.04 nine months ago.    CT Renal stone study performed on 03/16/2017 noted nonobstructing 3 mm stone inferior pole right kidney. No ureterolithiasis. No hydronephrosis.  Sigmoid colonic diverticulosis without evidence for acute diverticulitis.  Aortic atherosclerosis.   Today, she states she is having urgency, nocturia, intermittency, hesitancy, straining to urinate and a weak urinary stream.  She denies chills, nausea and vomiting. She states that she has low-grade fever.  She states that she has to sit and sit on the toilet until all the urine is out of her bladder.    Past Medical History:  Diagnosis Date  . Asthma    WELL CONTROLLED  . Bulging lumbar disc   . Cancer [facility]) 1999   thyroid with recurrent 2006  .  Complication of anesthesia    HEART STOPPED DURING TONSILLECTOMY (AGE 89) DUE TO ALLERGY TO ETHER  . DDD (degenerative disc disease), lumbar   . DDD (degenerative disc disease), thoracolumbar   . Diverticulosis   . Family history of adverse reaction to anesthesia    PTS FATHER-UNSURE WHAT HAPPENED  . GERD (gastroesophageal reflux disease)   . Heart murmur   . Hepatitis    AGE 44 "VIRAL"  . Hypertension    OFF MEDS CURRENTLY PER PCP (MASOUD)  . Hypothyroidism   . Interstitial cystitis   . PONV (postoperative nausea and vomiting)   . Thyroid  disease     Surgical History: Past Surgical History:  Procedure Laterality Date  . BLADDER SURGERY  1984  . BREAST SURGERY  0347,4259   mass removed  . BREAST SURGERY  April 2017   Dr Jack Quarto [facility]  . CATARACT EXTRACTION  2011  . CHOLECYSTECTOMY N/A 11/30/2015   Procedure: LAPAROSCOPIC CHOLECYSTECTOMY WITH INTRAOPERATIVE CHOLANGIOGRAM;  Surgeon: Christene Lye, MD;  Location: [facility] ORS;  Service: General;  Laterality: N/A;  . COLONOSCOPY  2005, 2015  . DILATION AND CURETTAGE OF UTERUS  1975  . EXCISIONAL HEMORRHOIDECTOMY  1975  . EYE SURGERY Left 2011  . [place]  . HERNIA REPAIR  2015  . THYROIDECTOMY  1999  . TONSILLECTOMY  1952   AGE 89    Home Medications:  Allergies as of 03/28/2017      Reactions   Pollen Extract Other (See Comments)   Ether    "HEART STOPPED WHILE IN SURGERY"   Gramineae Pollens Other (See Comments)   Penicillins Hives   Band-aid Plus Antibiotic [bacitracin-polymyxin B] Rash      Medication List       Accurate as of 03/28/17 11:59 PM. Always use your most recent med list.          albuterol (2.5 MG/3ML) 0.083% nebulizer solution Commonly known as:  PROVENTIL Inhale into the lungs.   ALBUTEROL IN Inhale into the lungs as needed.   AMBIEN 10 MG tablet Generic drug:  zolpidem Take 10 mg by mouth at bedtime as needed for sleep.   aspirin 81 MG tablet Take 81 mg by mouth daily.   Calcium 500 MG Chew Chew by mouth.   celecoxib 200 MG capsule Commonly known as:  CELEBREX Take by mouth.   cetirizine 10 MG tablet Commonly known as:  ZYRTEC Take 10 mg by mouth daily.   clonazePAM 0.5 MG tablet Commonly known as:  KLONOPIN Take 0.5 mg by mouth 2 (two) times daily as needed for anxiety.   cycloSPORINE 0.05 % ophthalmic emulsion Commonly known as:  RESTASIS 1 drop 2 (two) times daily.   enalapril 10 MG tablet Commonly known as:  VASOTEC Take 20 mg by mouth daily.   FLAXSEED OIL PO Take by mouth.     fluconazole 150 MG tablet Commonly known as:  DIFLUCAN Take by mouth.   FLUoxetine 40 MG capsule Commonly known as:  PROZAC Take 40 mg by mouth every morning.   fluticasone 50 MCG/ACT nasal spray Commonly known as:  FLONASE Place 1 spray into both nostrils daily.   Fluticasone-Salmeterol 250-50 MCG/DOSE Aepb Commonly known as:  ADVAIR Inhale 1 puff into the lungs 3 (three) times daily as needed.   furosemide 20 MG tablet Commonly known as:  LASIX Take 40 mg by mouth daily.   gabapentin 100 MG capsule Commonly known as:  NEURONTIN TAKE 2 CAPSULES BY MOUTH TWICE  DAILY   Glucosamine-Chondroitin 500-400 MG Caps Take by mouth.   hydrocortisone 25 MG suppository Commonly known as:  ANUSOL-HC Place 1 suppository (25 mg total) rectally 2 (two) times daily.   levothyroxine 150 MCG tablet Commonly known as:  SYNTHROID, LEVOTHROID Take 100 mcg by mouth daily before breakfast.   mirabegron ER 50 MG Tb24 tablet Commonly known as:  MYRBETRIQ Take 1 tablet (50 mg total) by mouth daily.   nitrofurantoin (macrocrystal-monohydrate) 100 MG capsule Commonly known as:  MACROBID Take 1 capsule (100 mg total) by mouth 2 (two) times daily.   olmesartan 20 MG tablet Commonly known as:  BENICAR Take 20 mg by mouth as needed (IF BP IS ELEVATED).   pantoprazole 40 MG tablet Commonly known as:  PROTONIX Take 1 tablet by mouth every morning.   potassium chloride SA 20 MEQ tablet Commonly known as:  K-DUR,KLOR-CON TK 1 T PO QD   predniSONE 5 MG tablet Commonly known as:  DELTASONE Take 5 mg by mouth daily with breakfast.   PRESCRIPTION MEDICATION Allergy injections once weekly   raloxifene 60 MG tablet Commonly known as:  EVISTA   tamsulosin 0.4 MG Caps capsule Commonly known as:  FLOMAX Take 1 capsule (0.4 mg total) by mouth daily.   tiZANidine 4 MG capsule Commonly known as:  ZANAFLEX Take 4 mg by mouth as needed.   vitamin C 500 MG tablet Commonly known as:  ASCORBIC  ACID Take by mouth.   Vitamin D-3 1000 units Caps Take by mouth daily.            Discharge Care Instructions        Start     Ordered   03/28/17 0000  tamsulosin (FLOMAX) 0.4 MG CAPS capsule  Daily    Question:  Supervising Provider  Answer:  Hollice Espy   03/28/17 1703      Allergies:  Allergies  Allergen Reactions  . Pollen Extract Other (See Comments)  . Ether     "HEART STOPPED WHILE IN SURGERY"  . Gramineae Pollens Other (See Comments)  . Penicillins Hives  . Band-Aid Plus Antibiotic [Bacitracin-Polymyxin B] Rash    Family History: Family History  Problem Relation Age of Onset  . Heart disease Mother   . Cancer Father        colon  . Heart disease Brother   . Diabetes Maternal Aunt   . Asthma Paternal Aunt   . Bladder Cancer Maternal Aunt   . Ovarian cancer Neg Hx   . Kidney cancer Neg Hx   . Prostate cancer Neg Hx     Social History:  reports that she quit smoking about 31 years ago. She has a 15.00 pack-year smoking history. She has never used smokeless tobacco. She reports that she does not drink alcohol or use drugs.  ROS: UROLOGY Frequent Urination?: No Hard to postpone urination?: Yes Burning/pain with urination?: No Get up at night to urinate?: Yes Leakage of urine?: No Urine stream starts and stops?: Yes Trouble starting stream?: Yes Do you have to strain to urinate?: Yes Blood in urine?: No Urinary tract infection?: No Sexually transmitted disease?: No Injury to kidneys or bladder?: No Painful intercourse?: No Weak stream?: Yes Currently pregnant?: No Vaginal bleeding?: No Last menstrual period?: n  Gastrointestinal Nausea?: No Vomiting?: No Indigestion/heartburn?: No Diarrhea?: No Constipation?: No  Constitutional Fever: Yes Night sweats?: No Weight loss?: No Fatigue?: Yes  Skin Skin rash/lesions?: Yes Itching?: No  Eyes Blurred vision?: No Double vision?: No  Ears/Nose/Throat Sore throat?: No Sinus  problems?: No  Hematologic/Lymphatic Swollen glands?: No Easy bruising?: Yes  Cardiovascular Leg swelling?: No Chest pain?: No  Respiratory Cough?: No Shortness of breath?: Yes  Endocrine Excessive thirst?: No  Musculoskeletal Back pain?: Yes Joint pain?: Yes  Neurological Headaches?: No Dizziness?: Yes  Psychologic Depression?: No Anxiety?: No  Physical Exam: BP (!) 145/82   Pulse 78   Temp 99.1 F (37.3 C) (Oral)   Ht 5\' 1"  (1.549 m)   Wt 167 lb 11.2 oz (76.1 kg)   BMI 31.69 kg/m   Constitutional: Well nourished. Alert and oriented, No acute distress. HEENT: [place] AT, moist mucus membranes. Trachea midline, no masses. Cardiovascular: No clubbing, cyanosis, or edema. Respiratory: Normal respiratory effort, no increased work of breathing. Skin: No rashes, bruises or suspicious lesions. Lymph: No cervical or inguinal adenopathy. Neurologic: Grossly intact, no focal deficits, moving all 4 extremities. Psychiatric: Normal mood and affect.   Laboratory Data: Lab Results  Component Value Date   WBC 9.9 03/06/2017   HGB 13.5 03/06/2017   HCT 40.2 03/06/2017   MCV 89.6 03/06/2017   PLT 320 03/06/2017    Lab Results  Component Value Date   CREATININE 1.42 (H) 03/06/2017    I have reviewed the labs.  Pertinent Imaging: CLINICAL DATA:  Patient with left flank pain. Pain radiates across the abdomen. Incontinence. Prior history of renal stones.  EXAM: CT ABDOMEN AND PELVIS WITHOUT CONTRAST  TECHNIQUE: Multidetector CT imaging of the abdomen and pelvis was performed following the standard protocol without IV contrast.  COMPARISON:  Right upper quadrant ultrasound 11/16/2015  FINDINGS: Lower chest: Normal heart size. Dependent atelectasis within the bilateral lower lobes. No pleural effusion.  Hepatobiliary: Liver is normal in size and contour. Gallbladder surgically absent. No intrahepatic or extrahepatic biliary  ductal dilatation.  Pancreas: Unremarkable  Spleen: Unremarkable  Adrenals/Urinary Tract: Adrenal glands are normal. Kidneys are symmetric in size. No hydronephrosis. 3 mm nonobstructing stone inferior pole right kidney (image 47; series 5). No ureterolithiasis. Urinary bladder is unremarkable.  Stomach/Bowel: Descending and sigmoid colonic diverticulosis. No CT evidence for acute diverticulitis. Normal appendix. No abnormal bowel wall thickening or evidence for bowel obstruction. No free fluid or free intraperitoneal air. Normal morphology of the stomach.  Vascular/Lymphatic: Normal caliber abdominal aorta. Peripheral calcified atherosclerotic plaque. No retroperitoneal lymphadenopathy.  Reproductive: Calcified fibroid within the uterus.  Other: Small bilateral fat containing inguinal hernias.  Musculoskeletal: Lumbar spine degenerative changes. No aggressive or acute appearing osseous lesions. Grade 1 anterolisthesis of L3 on L4.  IMPRESSION: Nonobstructing 3 mm stone inferior pole right kidney. No ureterolithiasis. No hydronephrosis.  Sigmoid colonic diverticulosis without evidence for acute diverticulitis.  Aortic atherosclerosis.   Electronically Signed   By: Lovey Newcomer M.D.   On: 03/16/2017 14:51 I have independently reviewed the films.  Assessment & Plan:    1. Straining to urinate  - hold Myrbetriq  - start tamsulosin 0.4 mg daily; advised to take it 30 minutes after a meal.  Advised of the side effects, such as: sinus congestion, nasal congestion, rhinorrhea, rhinitis, dizziness, and seasonal allergic rhinitis.   2. Frequency  - offered behavioral therapies, bladder training, bladder control strategies and pelvic floor muscle training - patient deferred  - fluid management - encouraged to decrease coffee intake  - feels Myrbetriq to 50 mg daily has provided relief - will hold at this time to see if tamsulosin provides better relief  - RTC  in 1 month with OAB questionnaire and  PVR  3. Nocturia  - found relief with an increase in Myrbetriq - hold the Myrbetriq at this time  - RTC in 1 months for OAB questionnaire and PVR  4. Vaginal atrophy  - Not a candidate for vaginal estrogen cream due to her history of breast cancer   - Advised to use olive oil Coconut oil for discomfort  5. Suprapubic pain  - no CIS found on recent cystoscopy  6. Right renal stone  - no intervention warranted at this time  - RTC in one year for KUB  - Advised to contact our office or seek treatment in the ED if becomes febrile or pain/ vomiting are difficult control in order to arrange for emergent/urgent intervention   Return in about 1 month (around 04/27/2017) for PVR and OAB questionnaire.  These notes generated with voice recognition software. I apologize for typographical errors.  Zara Council, [facility] [address], [place], [place] [address] [phone]

## 2017-03-28 ENCOUNTER — Ambulatory Visit (INDEPENDENT_AMBULATORY_CARE_PROVIDER_SITE_OTHER): Payer: Medicare Other | Admitting: Urology

## 2017-03-28 ENCOUNTER — Encounter: Payer: Self-pay | Admitting: Urology

## 2017-03-28 VITALS — BP 145/82 | HR 78 | Temp 99.1°F | Ht 61.0 in | Wt 167.7 lb

## 2017-03-28 DIAGNOSIS — R350 Frequency of micturition: Secondary | ICD-10-CM | POA: Diagnosis not present

## 2017-03-28 DIAGNOSIS — R102 Pelvic and perineal pain: Secondary | ICD-10-CM

## 2017-03-28 DIAGNOSIS — N200 Calculus of kidney: Secondary | ICD-10-CM

## 2017-03-28 DIAGNOSIS — N952 Postmenopausal atrophic vaginitis: Secondary | ICD-10-CM

## 2017-03-28 DIAGNOSIS — R351 Nocturia: Secondary | ICD-10-CM

## 2017-03-28 DIAGNOSIS — R3916 Straining to void: Secondary | ICD-10-CM

## 2017-03-28 DIAGNOSIS — J3089 Other allergic rhinitis: Secondary | ICD-10-CM | POA: Diagnosis not present

## 2017-03-28 DIAGNOSIS — J301 Allergic rhinitis due to pollen: Secondary | ICD-10-CM | POA: Diagnosis not present

## 2017-03-28 MED ORDER — TAMSULOSIN HCL 0.4 MG PO CAPS: 0.4000 mg | ORAL_CAPSULE | Freq: Every day | ORAL | 11 refills | Status: DC

## 2017-04-04 DIAGNOSIS — J3089 Other allergic rhinitis: Secondary | ICD-10-CM | POA: Diagnosis not present

## 2017-04-04 DIAGNOSIS — J301 Allergic rhinitis due to pollen: Secondary | ICD-10-CM | POA: Diagnosis not present

## 2017-04-11 DIAGNOSIS — N3010 Interstitial cystitis (chronic) without hematuria: Secondary | ICD-10-CM | POA: Diagnosis not present

## 2017-04-11 DIAGNOSIS — L209 Atopic dermatitis, unspecified: Secondary | ICD-10-CM | POA: Diagnosis not present

## 2017-04-11 DIAGNOSIS — D219 Benign neoplasm of connective and other soft tissue, unspecified: Secondary | ICD-10-CM | POA: Diagnosis not present

## 2017-04-11 DIAGNOSIS — N200 Calculus of kidney: Secondary | ICD-10-CM | POA: Diagnosis not present

## 2017-04-12 DIAGNOSIS — E034 Atrophy of thyroid (acquired): Secondary | ICD-10-CM | POA: Diagnosis not present

## 2017-04-13 DIAGNOSIS — D225 Melanocytic nevi of trunk: Secondary | ICD-10-CM | POA: Diagnosis not present

## 2017-04-13 DIAGNOSIS — I10 Essential (primary) hypertension: Secondary | ICD-10-CM | POA: Diagnosis not present

## 2017-04-13 DIAGNOSIS — L02223 Furuncle of chest wall: Secondary | ICD-10-CM | POA: Diagnosis not present

## 2017-04-13 DIAGNOSIS — L0202 Furuncle of face: Secondary | ICD-10-CM | POA: Diagnosis not present

## 2017-04-13 DIAGNOSIS — L0212 Furuncle of neck: Secondary | ICD-10-CM | POA: Diagnosis not present

## 2017-04-13 DIAGNOSIS — N200 Calculus of kidney: Secondary | ICD-10-CM | POA: Diagnosis not present

## 2017-04-13 DIAGNOSIS — E669 Obesity, unspecified: Secondary | ICD-10-CM | POA: Diagnosis not present

## 2017-04-13 DIAGNOSIS — N3010 Interstitial cystitis (chronic) without hematuria: Secondary | ICD-10-CM | POA: Diagnosis not present

## 2017-04-18 DIAGNOSIS — J3089 Other allergic rhinitis: Secondary | ICD-10-CM | POA: Diagnosis not present

## 2017-04-18 DIAGNOSIS — J301 Allergic rhinitis due to pollen: Secondary | ICD-10-CM | POA: Diagnosis not present

## 2017-04-24 DIAGNOSIS — Z23 Encounter for immunization: Secondary | ICD-10-CM | POA: Diagnosis not present

## 2017-04-24 NOTE — Progress Notes (Signed)
04/25/2017 8:29 AM   [name] 1946/02/19 [id number]  Referring provider: [name], MD [address] [place], [place] [address]  Chief Complaint  Patient presents with  . Follow-up    1 month Frequency / vaginal atrophy    HPI: 71 yo WF who presents today for a follow up after a trial of tamsulosin for straining to urinate.    Background history Patient is a 89 -year-old Caucasian female who is referred to [place] by, Dr. Lavera Guise, for urinary frequency.   She is having associated urinary frequency x q 2 hours, strong urgency, dysuria, nocturia x 5, intermittency, hesitancy, straining to urinate and weak urinary stream.   She does have a history of urinary tract infections, STI's or injury to the bladder.   She denies gross hematuria, suprapubic pain, abdominal pain or flank pain.   She has not had any recent fevers, chills, nausea or vomiting.   She has a remote history of stones.  She has had a bladder tack in the 70's and bladder fulguration in the 80's with Dr. Madelin Headings.  She is post menopausal.   She admits to diarrhea.  She is not having pain with bladder filling.  She has not had any recent imaging studies.  She is drinking 3 bottles of water daily.   She is drinking 4 caffeinated beverages daily.  She is not drinking alcoholic beverages daily.  She is taking antihistamines, decongestants, benzo's, diuretics and antidepressants   Her PVR is 0 mL.  She has a history of IC.    In 12/2016, she contacted the office complaining of dysuria and severe suprapubic discomfort.  A UA that day was unremarkable.  Urine culture shows mixed urogenital flora.   On 12/29/2016, she was experiencing urgency, nocturia, intermittency, hesitancy, straining to urinate and a weak urinary stream.  The patient has been experiencing urgency x 0-3 (stable), frequency x >8 (stable), is restricting fluids to avoid visits to the restroom, is engaging in toilet mapping, incontinence x 0-3 (stable) and  nocturia x 4-7 (stable).  Her PVR was 39 mL.    She stated she is having bladder pain that is relieved with urination.  She was having relief with the Myrbetriq, but her symptoms returned two days ago.  She is not having gross hematuria.  She denies fevers, chills, nausea and vomiting.    She underwent cystoscopy with Dr. Erlene Quan on 01/09/2017 and it was negative.  On 03/08/2017, she had been experiencing urgency x 4-7 (worse), frequency x 8 or more (stable), not restricting fluids to avoid visits to the restroom, is engaging in toilet mapping, incontinence x 0-3 (stable) and nocturia x 0-3 (improved).  Her PVR was 39 mL.  She had been having left sided flank pain recently and her serum creatinine has risen to 1.42 from 1.04 nine months ago.    CT Renal stone study performed on 03/16/2017 noted nonobstructing 3 mm stone inferior pole right kidney. No ureterolithiasis. No hydronephrosis.  Sigmoid colonic diverticulosis without evidence for acute diverticulitis.  Aortic atherosclerosis.   At her visit on 03/28/2017, she was instructed to hold the Myrberiq and start tamsulosin.     Today, she states she is having nocturia, intermittency, hesitancy, straining to urinate and a weak urinary stream.  She denies chills, nausea and vomiting. She states that she has low-grade fever.  She states that she has to sit and sit on the toilet until all the urine is out of her bladder.  She  is experiencing urgency x 0-3 (improved), frequency x 4-7 (improved), not restricting fluids to avoid visits to the restroom, is engaging in toilet mapping, incontinence x 0-3 (stable) and nocturia x 0-3 (stable).  Her PVR is 0 mL.  She stopped the tamsulosin because she states it made her crazy.  She states that her daughter had recently had her urethra dilated and was happy with the results. She states that she was dilated in the past and would like to have this performed again.    Past Medical History:  Diagnosis Date  .  Asthma    WELL CONTROLLED  . Bulging lumbar disc   . Cancer [facility]) 1999   thyroid with recurrent 2006  . Complication of anesthesia    HEART STOPPED DURING TONSILLECTOMY (AGE 56) DUE TO ALLERGY TO ETHER  . DDD (degenerative disc disease), lumbar   . DDD (degenerative disc disease), thoracolumbar   . Diverticulosis   . Family history of adverse reaction to anesthesia    PTS FATHER-UNSURE WHAT HAPPENED  . GERD (gastroesophageal reflux disease)   . Heart murmur   . Hepatitis    AGE 64 "VIRAL"  . Hypertension    OFF MEDS CURRENTLY PER PCP (MASOUD)  . Hypothyroidism   . Interstitial cystitis   . PONV (postoperative nausea and vomiting)   . Thyroid disease     Surgical History: Past Surgical History:  Procedure Laterality Date  . BLADDER SURGERY  1984  . BREAST SURGERY  8921,1941   mass removed  . BREAST SURGERY  April 2017   Dr Jack Quarto [facility]  . CATARACT EXTRACTION  2011  . CHOLECYSTECTOMY N/A 11/30/2015   Procedure: LAPAROSCOPIC CHOLECYSTECTOMY WITH INTRAOPERATIVE CHOLANGIOGRAM;  Surgeon: Christene Lye, MD;  Location: [facility] ORS;  Service: General;  Laterality: N/A;  . COLONOSCOPY  2005, 2015  . DILATION AND CURETTAGE OF UTERUS  1975  . EXCISIONAL HEMORRHOIDECTOMY  1975  . EYE SURGERY Left 2011  . [place]  . HERNIA REPAIR  2015  . THYROIDECTOMY  1999  . TONSILLECTOMY  1952   AGE 56    Home Medications:  Allergies as of 04/25/2017      Reactions   Pollen Extract Other (See Comments)   Ether    "HEART STOPPED WHILE IN SURGERY"   Gramineae Pollens Other (See Comments)   Penicillins Hives   Band-aid Plus Antibiotic [bacitracin-polymyxin B] Rash      Medication List       Accurate as of 04/25/17 11:59 PM. Always use your most recent med list.          albuterol (2.5 MG/3ML) 0.083% nebulizer solution Commonly known as:  PROVENTIL Inhale into the lungs.   ALBUTEROL IN Inhale into the lungs as needed.   AMBIEN 10 MG tablet Generic drug:   zolpidem Take 10 mg by mouth at bedtime as needed for sleep.   aspirin 81 MG tablet Take 81 mg by mouth daily.   Calcium 500 MG Chew Chew by mouth.   celecoxib 200 MG capsule Commonly known as:  CELEBREX Take by mouth.   cetirizine 10 MG tablet Commonly known as:  ZYRTEC Take 10 mg by mouth daily.   clonazePAM 0.5 MG tablet Commonly known as:  KLONOPIN Take 0.5 mg by mouth 2 (two) times daily as needed for anxiety.   cycloSPORINE 0.05 % ophthalmic emulsion Commonly known as:  RESTASIS 1 drop 2 (two) times daily.   doxycycline 100 MG capsule Commonly known as:  VIBRAMYCIN Take  100 mg by mouth 2 (two) times daily.   enalapril 10 MG tablet Commonly known as:  VASOTEC Take 20 mg by mouth daily.   FLAXSEED OIL PO Take by mouth.   Flax Seed Oil 1000 MG Caps Take by mouth.   fluconazole 150 MG tablet Commonly known as:  DIFLUCAN Take by mouth.   FLUoxetine 40 MG capsule Commonly known as:  PROZAC Take 40 mg by mouth every morning.   fluticasone 50 MCG/ACT nasal spray Commonly known as:  FLONASE Place 1 spray into both nostrils daily.   Fluticasone-Salmeterol 250-50 MCG/DOSE Aepb Commonly known as:  ADVAIR Inhale 1 puff into the lungs 3 (three) times daily as needed.   furosemide 20 MG tablet Commonly known as:  LASIX Take 40 mg by mouth daily.   gabapentin 100 MG capsule Commonly known as:  NEURONTIN TAKE 2 CAPSULES BY MOUTH TWICE DAILY   Glucosamine-Chondroitin 500-400 MG Caps Take by mouth.   HYDROcodone-acetaminophen 7.5-325 MG tablet Commonly known as:  NORCO Take by mouth.   hydrocortisone 25 MG suppository Commonly known as:  ANUSOL-HC Place 1 suppository (25 mg total) rectally 2 (two) times daily.   levothyroxine 150 MCG tablet Commonly known as:  SYNTHROID, LEVOTHROID Take 125 mcg by mouth daily before breakfast.   mirabegron ER 50 MG Tb24 tablet Commonly known as:  MYRBETRIQ Take 1 tablet (50 mg total) by mouth daily.     nitrofurantoin (macrocrystal-monohydrate) 100 MG capsule Commonly known as:  MACROBID Take 1 capsule (100 mg total) by mouth 2 (two) times daily.   olmesartan 20 MG tablet Commonly known as:  BENICAR Take 20 mg by mouth as needed (IF BP IS ELEVATED).   pantoprazole 40 MG tablet Commonly known as:  PROTONIX Take 1 tablet by mouth every morning.   potassium chloride SA 20 MEQ tablet Commonly known as:  K-DUR,KLOR-CON TK 1 T PO QD   predniSONE 5 MG tablet Commonly known as:  DELTASONE Take 5 mg by mouth daily with breakfast.   PRESCRIPTION MEDICATION Allergy injections once weekly   raloxifene 60 MG tablet Commonly known as:  EVISTA   tamsulosin 0.4 MG Caps capsule Commonly known as:  FLOMAX Take 1 capsule (0.4 mg total) by mouth daily.   tiZANidine 4 MG capsule Commonly known as:  ZANAFLEX Take 4 mg by mouth as needed.   vitamin C 500 MG tablet Commonly known as:  ASCORBIC ACID Take by mouth.   Vitamin D-3 1000 units Caps Take by mouth daily.       Allergies:  Allergies  Allergen Reactions  . Pollen Extract Other (See Comments)  . Ether     "HEART STOPPED WHILE IN SURGERY"  . Gramineae Pollens Other (See Comments)  . Penicillins Hives  . Band-Aid Plus Antibiotic [Bacitracin-Polymyxin B] Rash    Family History: Family History  Problem Relation Age of Onset  . Heart disease Mother   . Cancer Father        colon  . Heart disease Brother   . Diabetes Maternal Aunt   . Asthma Paternal Aunt   . Bladder Cancer Maternal Aunt   . Ovarian cancer Neg Hx   . Kidney cancer Neg Hx   . Prostate cancer Neg Hx     Social History:  reports that she quit smoking about 31 years ago. She has a 15.00 pack-year smoking history. She has never used smokeless tobacco. She reports that she does not drink alcohol or use drugs.  ROS: UROLOGY Frequent Urination?: No  Hard to postpone urination?: No Burning/pain with urination?: No Get up at night to urinate?:  Yes Leakage of urine?: No Urine stream starts and stops?: Yes Trouble starting stream?: Yes Do you have to strain to urinate?: Yes Blood in urine?: No Urinary tract infection?: No Sexually transmitted disease?: No Injury to kidneys or bladder?: No Painful intercourse?: No Weak stream?: Yes Currently pregnant?: No Vaginal bleeding?: No Last menstrual period?: n  Gastrointestinal Nausea?: Yes Vomiting?: No Indigestion/heartburn?: No Diarrhea?: No Constipation?: No  Constitutional Fever: No Night sweats?: No Weight loss?: No Fatigue?: Yes  Skin Skin rash/lesions?: Yes Itching?: No  Eyes Blurred vision?: No Double vision?: No  Ears/Nose/Throat Sore throat?: No Sinus problems?: Yes  Hematologic/Lymphatic Swollen glands?: No Easy bruising?: No  Cardiovascular Leg swelling?: No Chest pain?: No  Respiratory Cough?: No Shortness of breath?: No  Endocrine Excessive thirst?: No  Musculoskeletal Back pain?: Yes Joint pain?: Yes  Neurological Headaches?: Yes Dizziness?: No  Psychologic Depression?: No Anxiety?: No  Physical Exam: BP (!) 148/80   Pulse 76   Ht 5\' 1"  (1.549 m)   Wt 168 lb 4.8 oz (76.3 kg)   BMI 31.80 kg/m   Constitutional: Well nourished. Alert and oriented, No acute distress. HEENT: [place] AT, moist mucus membranes. Trachea midline, no masses. Cardiovascular: No clubbing, cyanosis, or edema. Respiratory: Normal respiratory effort, no increased work of breathing. Skin: No rashes, bruises or suspicious lesions. Lymph: No cervical or inguinal adenopathy. Neurologic: Grossly intact, no focal deficits, moving all 4 extremities. Psychiatric: Normal mood and affect.   Laboratory Data: Lab Results  Component Value Date   WBC 9.9 03/06/2017   HGB 13.5 03/06/2017   HCT 40.2 03/06/2017   MCV 89.6 03/06/2017   PLT 320 03/06/2017    Lab Results  Component Value Date   CREATININE 1.42 (H) 03/06/2017    I have reviewed the  labs.  Pertinent Imaging: Results for ARABIA, NYLUND (MRN [id number]) as of 05/04/2017 08:28  Ref. Range 04/25/2017 15:59  Scan Result Unknown 0    Assessment & Plan:    1. Straining to urinate  - failed Myrbetriq and tamsulosin  - I did discuss with the patient that urethral dilation has fallen out of favor due to its potential for causing worsening of symptoms due to scar tissue and creating urethral strictures.  She understands this risks and would like to proceed as she's had urethral dilations in the past and found them successful  - RTC for urethral dilation   2. Frequency  - offered behavioral therapies, bladder training, bladder control strategies and pelvic floor muscle training - patient deferred  - fluid management - encouraged to decrease coffee intake  - see above  3. Nocturia  - found relief with an increase in Myrbetriq - hold the Myrbetriq at this time  - see above  4. Vaginal atrophy  - Not a candidate for vaginal estrogen cream due to her history of breast cancer   - Advised to use olive oil Coconut oil for discomfort  5. Suprapubic pain  - no CIS found on recent cystoscopy  6. Right renal stone  - no intervention warranted at this time  - RTC in one year for KUB  - Advised to contact our office or seek treatment in the ED if becomes febrile or pain/ vomiting are difficult control in order to arrange for emergent/urgent intervention   Return for schedule urethral dilation with me .  These notes generated with voice recognition software. I apologize  for typographical errors.  Zara Council, [facility] [address] [place],  [address] [phone]

## 2017-04-25 ENCOUNTER — Encounter: Payer: Self-pay | Admitting: Urology

## 2017-04-25 ENCOUNTER — Ambulatory Visit (INDEPENDENT_AMBULATORY_CARE_PROVIDER_SITE_OTHER): Payer: Medicare Other | Admitting: Urology

## 2017-04-25 VITALS — BP 148/80 | HR 76 | Ht 61.0 in | Wt 168.3 lb

## 2017-04-25 DIAGNOSIS — N952 Postmenopausal atrophic vaginitis: Secondary | ICD-10-CM | POA: Diagnosis not present

## 2017-04-25 DIAGNOSIS — N200 Calculus of kidney: Secondary | ICD-10-CM

## 2017-04-25 DIAGNOSIS — R351 Nocturia: Secondary | ICD-10-CM | POA: Diagnosis not present

## 2017-04-25 DIAGNOSIS — R102 Pelvic and perineal pain: Secondary | ICD-10-CM

## 2017-04-25 DIAGNOSIS — R350 Frequency of micturition: Secondary | ICD-10-CM | POA: Diagnosis not present

## 2017-04-25 DIAGNOSIS — R3916 Straining to void: Secondary | ICD-10-CM | POA: Diagnosis not present

## 2017-04-25 LAB — BLADDER SCAN AMB NON-IMAGING: Scan Result: 0

## 2017-04-27 DIAGNOSIS — J3089 Other allergic rhinitis: Secondary | ICD-10-CM | POA: Diagnosis not present

## 2017-04-27 DIAGNOSIS — J3081 Allergic rhinitis due to animal (cat) (dog) hair and dander: Secondary | ICD-10-CM | POA: Diagnosis not present

## 2017-04-27 DIAGNOSIS — J301 Allergic rhinitis due to pollen: Secondary | ICD-10-CM | POA: Diagnosis not present

## 2017-05-01 DIAGNOSIS — Z6831 Body mass index (BMI) 31.0-31.9, adult: Secondary | ICD-10-CM | POA: Diagnosis not present

## 2017-05-01 DIAGNOSIS — Z1231 Encounter for screening mammogram for malignant neoplasm of breast: Secondary | ICD-10-CM | POA: Diagnosis not present

## 2017-05-08 NOTE — Progress Notes (Signed)
05/09/2017 2:25 PM   [name] [birth year] [id number]  Referring provider: [name], MD [address] [place], [place] [address]  No chief complaint on file.   HPI: 71 yo WF who presents today for an urethral dilation for straining to urinate.    Background history Patient is a 79 -year-old Caucasian female who is referred to [place] by, Dr. Lavera Guise, for urinary frequency.   She is having associated urinary frequency x q 2 hours, strong urgency, dysuria, nocturia x 5, intermittency, hesitancy, straining to urinate and weak urinary stream.   She does have a history of urinary tract infections, STI's or injury to the bladder.   She denies gross hematuria, suprapubic pain, abdominal pain or flank pain.   She has not had any recent fevers, chills, nausea or vomiting.   She has a remote history of stones.  She has had a bladder tack in the 70's and bladder fulguration in the 80's with Dr. Madelin Headings.  She is post menopausal.   She admits to diarrhea.  She is not having pain with bladder filling.  She has not had any recent imaging studies.  She is drinking 3 bottles of water daily.   She is drinking 4 caffeinated beverages daily.  She is not drinking alcoholic beverages daily.  She is taking antihistamines, decongestants, benzo's, diuretics and antidepressants   Her PVR is 0 mL.  She has a history of IC.    In 12/2016, she contacted the office complaining of dysuria and severe suprapubic discomfort.  A UA that day was unremarkable.  Urine culture shows mixed urogenital flora.   On 12/29/2016, she was experiencing urgency, nocturia, intermittency, hesitancy, straining to urinate and a weak urinary stream.  The patient has been experiencing urgency x 0-3 (stable), frequency x >8 (stable), is restricting fluids to avoid visits to the restroom, is engaging in toilet mapping, incontinence x 0-3 (stable) and nocturia x 4-7 (stable).  Her PVR was 39 mL.    She stated she is having bladder pain  that is relieved with urination.  She was having relief with the Myrbetriq, but her symptoms returned two days ago.  She is not having gross hematuria.  She denies fevers, chills, nausea and vomiting.    She underwent cystoscopy with Dr. Erlene Quan on 01/09/2017 and it was negative.  On 03/08/2017, she had been experiencing urgency x 4-7 (worse), frequency x 8 or more (stable), not restricting fluids to avoid visits to the restroom, is engaging in toilet mapping, incontinence x 0-3 (stable) and nocturia x 0-3 (improved).  Her PVR was 39 mL.  She had been having left sided flank pain recently and her serum creatinine has risen to 1.42 from 1.04 nine months ago.    CT Renal stone study performed on 03/16/2017 noted nonobstructing 3 mm stone inferior pole right kidney. No ureterolithiasis. No hydronephrosis.  Sigmoid colonic diverticulosis without evidence for acute diverticulitis.  Aortic atherosclerosis.   At her visit on 03/28/2017, she was instructed to hold the Myrberiq and start tamsulosin.     On 04/25/2017, she stated she is having nocturia, intermittency, hesitancy, straining to urinate and a weak urinary stream.  She denies chills, nausea and vomiting. She states that she has low-grade fever.  She states that she has to sit and sit on the toilet until all the urine is out of her bladder.  She was experiencing urgency x 0-3 (improved), frequency x 4-7 (improved), not restricting fluids to avoid visits to the  restroom, is engaging in toilet mapping, incontinence x 0-3 (stable) and nocturia x 0-3 (stable).  Her PVR was 0 mL.  She stopped the tamsulosin because she states it made her crazy.  She states that her daughter had recently had her urethra dilated and was happy with the results. She states that she was dilated in the past and would like to have this performed again.  Today, the patient states that she was seen at her PCPs office. Her PCP obtained a urine and sent it for culture.   Past  Medical History:  Diagnosis Date  . Asthma    WELL CONTROLLED  . Bulging lumbar disc   . Cancer [facility]) 1999   thyroid with recurrent 2006  . Complication of anesthesia    HEART STOPPED DURING TONSILLECTOMY (AGE 9) DUE TO ALLERGY TO ETHER  . DDD (degenerative disc disease), lumbar   . DDD (degenerative disc disease), thoracolumbar   . Diverticulosis   . Family history of adverse reaction to anesthesia    PTS FATHER-UNSURE WHAT HAPPENED  . GERD (gastroesophageal reflux disease)   . Heart murmur   . Hepatitis    AGE 10 "VIRAL"  . Hypertension    OFF MEDS CURRENTLY PER PCP (MASOUD)  . Hypothyroidism   . Interstitial cystitis   . PONV (postoperative nausea and vomiting)   . Thyroid disease     Surgical History: Past Surgical History:  Procedure Laterality Date  . BLADDER SURGERY  1984  . BREAST SURGERY  [id number]   mass removed  . BREAST SURGERY  April 2017   Dr Jack Quarto [facility]  . CATARACT EXTRACTION  2011  . CHOLECYSTECTOMY N/A 11/30/2015   Procedure: LAPAROSCOPIC CHOLECYSTECTOMY WITH INTRAOPERATIVE CHOLANGIOGRAM;  Surgeon: Christene Lye, MD;  Location: [facility] ORS;  Service: General;  Laterality: N/A;  . COLONOSCOPY  2005, 2015  . DILATION AND CURETTAGE OF UTERUS  1975  . EXCISIONAL HEMORRHOIDECTOMY  1975  . EYE SURGERY Left 2011  . [place]  . HERNIA REPAIR  2015  . THYROIDECTOMY  1999  . TONSILLECTOMY  1952   AGE 9    Home Medications:  Allergies as of 05/09/2017      Reactions   Pollen Extract Other (See Comments)   Ether    "HEART STOPPED WHILE IN SURGERY"   Gramineae Pollens Other (See Comments)   Penicillins Hives   Band-aid Plus Antibiotic [bacitracin-polymyxin B] Rash      Medication List       Accurate as of 05/09/17  2:25 PM. Always use your most recent med list.          albuterol (2.5 MG/3ML) 0.083% nebulizer solution Commonly known as:  PROVENTIL Inhale into the lungs.   ALBUTEROL IN Inhale into the lungs as needed.     AMBIEN 10 MG tablet Generic drug:  zolpidem Take 10 mg by mouth at bedtime as needed for sleep.   aspirin 81 MG tablet Take 81 mg by mouth daily.   Calcium 500 MG Chew Chew by mouth.   celecoxib 200 MG capsule Commonly known as:  CELEBREX Take by mouth.   cetirizine 10 MG tablet Commonly known as:  ZYRTEC Take 10 mg by mouth daily.   clonazePAM 0.5 MG tablet Commonly known as:  KLONOPIN Take 0.5 mg by mouth 2 (two) times daily as needed for anxiety.   cycloSPORINE 0.05 % ophthalmic emulsion Commonly known as:  RESTASIS 1 drop 2 (two) times daily.   doxycycline 100 MG capsule  Commonly known as:  VIBRAMYCIN Take 100 mg by mouth 2 (two) times daily.   enalapril 10 MG tablet Commonly known as:  VASOTEC Take 20 mg by mouth daily.   FLAXSEED OIL PO Take by mouth.   Flax Seed Oil 1000 MG Caps Take by mouth.   fluconazole 150 MG tablet Commonly known as:  DIFLUCAN Take by mouth.   FLUoxetine 40 MG capsule Commonly known as:  PROZAC Take 40 mg by mouth every morning.   fluticasone 50 MCG/ACT nasal spray Commonly known as:  FLONASE Place 1 spray into both nostrils daily.   Fluticasone-Salmeterol 250-50 MCG/DOSE Aepb Commonly known as:  ADVAIR Inhale 1 puff into the lungs 3 (three) times daily as needed.   furosemide 20 MG tablet Commonly known as:  LASIX Take 40 mg by mouth daily.   gabapentin 100 MG capsule Commonly known as:  NEURONTIN TAKE 2 CAPSULES BY MOUTH TWICE DAILY   Glucosamine-Chondroitin 500-400 MG Caps Take by mouth.   HYDROcodone-acetaminophen 7.5-325 MG tablet Commonly known as:  NORCO Take by mouth.   hydrocortisone 25 MG suppository Commonly known as:  ANUSOL-HC Place 1 suppository (25 mg total) rectally 2 (two) times daily.   levothyroxine 150 MCG tablet Commonly known as:  SYNTHROID, LEVOTHROID Take 125 mcg by mouth daily before breakfast.   mirabegron ER 50 MG Tb24 tablet Commonly known as:  MYRBETRIQ Take 1 tablet (50 mg  total) by mouth daily.   nitrofurantoin (macrocrystal-monohydrate) 100 MG capsule Commonly known as:  MACROBID Take 1 capsule (100 mg total) by mouth 2 (two) times daily.   olmesartan 20 MG tablet Commonly known as:  BENICAR Take 20 mg by mouth as needed (IF BP IS ELEVATED).   pantoprazole 40 MG tablet Commonly known as:  PROTONIX Take 1 tablet by mouth every morning.   potassium chloride SA 20 MEQ tablet Commonly known as:  K-DUR,KLOR-CON TK 1 T PO QD   predniSONE 5 MG tablet Commonly known as:  DELTASONE Take 5 mg by mouth daily with breakfast.   PRESCRIPTION MEDICATION Allergy injections once weekly   raloxifene 60 MG tablet Commonly known as:  EVISTA   tamsulosin 0.4 MG Caps capsule Commonly known as:  FLOMAX Take 1 capsule (0.4 mg total) by mouth daily.   tiZANidine 4 MG capsule Commonly known as:  ZANAFLEX Take 4 mg by mouth as needed.   vitamin C 500 MG tablet Commonly known as:  ASCORBIC ACID Take by mouth.   Vitamin D-3 1000 units Caps Take by mouth daily.       Allergies:  Allergies  Allergen Reactions  . Pollen Extract Other (See Comments)  . Ether     "HEART STOPPED WHILE IN SURGERY"  . Gramineae Pollens Other (See Comments)  . Penicillins Hives  . Band-Aid Plus Antibiotic [Bacitracin-Polymyxin B] Rash    Family History: Family History  Problem Relation Age of Onset  . Heart disease Mother   . Cancer Father        colon  . Heart disease Brother   . Diabetes Maternal Aunt   . Asthma Paternal Aunt   . Bladder Cancer Maternal Aunt   . Ovarian cancer Neg Hx   . Kidney cancer Neg Hx   . Prostate cancer Neg Hx     Social History:  reports that she quit smoking about 31 years ago. She has a 15.00 pack-year smoking history. She has never used smokeless tobacco. She reports that she does not drink alcohol or use drugs.  ROS: Not obtained  Physical Exam: Not performed  Laboratory Data: Lab Results  Component Value Date   WBC 9.9  03/06/2017   HGB 13.5 03/06/2017   HCT 40.2 03/06/2017   MCV 89.6 03/06/2017   PLT 320 03/06/2017    Lab Results  Component Value Date   CREATININE 1.42 (H) 03/06/2017    I have reviewed the labs.  Pertinent Imaging: Results for HANIYYAH, SAKUMA (MRN [id number]) as of 05/04/2017 08:28  Ref. Range 04/25/2017 15:59  Scan Result Unknown 0   Assessment & Plan:    1. Straining to urinate  - failed Myrbetriq and tamsulosin  - urethral dilation not completed today  - RTC urethral dilation   2. Frequency  - offered behavioral therapies, bladder training, bladder control strategies and pelvic floor muscle training - patient deferred  - fluid management - encouraged to decrease coffee intake  - see above  3. Nocturia  - see above  4. Vaginal atrophy  - Not a candidate for vaginal estrogen cream due to her history of breast cancer   - Advised to use olive oil Coconut oil for discomfort  5. Suprapubic pain  - no CIS found on recent cystoscopy  6. Right renal stone  - no intervention warranted at this time  - RTC in one year for KUB (02/2018)  - Advised to contact our office or seek treatment in the ED if becomes febrile or pain/ vomiting are difficult control in order to arrange for emergent/urgent intervention   Return for pending urine culture.  These notes generated with voice recognition software. I apologize for typographical errors.  Zara Council, [facility] [address], [place], [place] [address] [phone]

## 2017-05-09 ENCOUNTER — Ambulatory Visit (INDEPENDENT_AMBULATORY_CARE_PROVIDER_SITE_OTHER): Payer: Medicare Other | Admitting: Urology

## 2017-05-09 DIAGNOSIS — J3089 Other allergic rhinitis: Secondary | ICD-10-CM | POA: Diagnosis not present

## 2017-05-09 DIAGNOSIS — R350 Frequency of micturition: Secondary | ICD-10-CM

## 2017-05-09 DIAGNOSIS — N952 Postmenopausal atrophic vaginitis: Secondary | ICD-10-CM

## 2017-05-09 DIAGNOSIS — J301 Allergic rhinitis due to pollen: Secondary | ICD-10-CM | POA: Diagnosis not present

## 2017-05-09 DIAGNOSIS — I10 Essential (primary) hypertension: Secondary | ICD-10-CM | POA: Diagnosis not present

## 2017-05-09 DIAGNOSIS — N390 Urinary tract infection, site not specified: Secondary | ICD-10-CM | POA: Diagnosis not present

## 2017-05-09 DIAGNOSIS — R351 Nocturia: Secondary | ICD-10-CM

## 2017-05-09 DIAGNOSIS — N183 Chronic kidney disease, stage 3 (moderate): Secondary | ICD-10-CM | POA: Diagnosis not present

## 2017-05-09 DIAGNOSIS — R3916 Straining to void: Secondary | ICD-10-CM

## 2017-05-09 DIAGNOSIS — N200 Calculus of kidney: Secondary | ICD-10-CM | POA: Diagnosis not present

## 2017-05-18 DIAGNOSIS — J3089 Other allergic rhinitis: Secondary | ICD-10-CM | POA: Diagnosis not present

## 2017-05-18 DIAGNOSIS — J301 Allergic rhinitis due to pollen: Secondary | ICD-10-CM | POA: Diagnosis not present

## 2017-05-19 DIAGNOSIS — J449 Chronic obstructive pulmonary disease, unspecified: Secondary | ICD-10-CM | POA: Diagnosis not present

## 2017-05-19 DIAGNOSIS — N184 Chronic kidney disease, stage 4 (severe): Secondary | ICD-10-CM | POA: Diagnosis not present

## 2017-05-19 DIAGNOSIS — N3010 Interstitial cystitis (chronic) without hematuria: Secondary | ICD-10-CM | POA: Diagnosis not present

## 2017-05-19 DIAGNOSIS — M797 Fibromyalgia: Secondary | ICD-10-CM | POA: Diagnosis not present

## 2017-05-22 ENCOUNTER — Telehealth: Payer: Self-pay | Admitting: Urology

## 2017-05-22 DIAGNOSIS — J3089 Other allergic rhinitis: Secondary | ICD-10-CM | POA: Diagnosis not present

## 2017-05-22 DIAGNOSIS — J301 Allergic rhinitis due to pollen: Secondary | ICD-10-CM | POA: Diagnosis not present

## 2017-05-22 NOTE — Telephone Encounter (Signed)
Pt called to cancel appt tomorrow for dilation.  She said tell you she's doing good and she call back if she needs [place]. Just F.Y.I.

## 2017-05-23 ENCOUNTER — Ambulatory Visit: Payer: Medicare Other | Admitting: Urology

## 2017-05-24 DIAGNOSIS — M5136 Other intervertebral disc degeneration, lumbar region: Secondary | ICD-10-CM | POA: Diagnosis not present

## 2017-05-24 DIAGNOSIS — M5416 Radiculopathy, lumbar region: Secondary | ICD-10-CM | POA: Diagnosis not present

## 2017-05-25 DIAGNOSIS — J3089 Other allergic rhinitis: Secondary | ICD-10-CM | POA: Diagnosis not present

## 2017-05-25 DIAGNOSIS — J301 Allergic rhinitis due to pollen: Secondary | ICD-10-CM | POA: Diagnosis not present

## 2017-05-26 ENCOUNTER — Encounter (INDEPENDENT_AMBULATORY_CARE_PROVIDER_SITE_OTHER): Payer: Self-pay | Admitting: Vascular Surgery

## 2017-05-26 ENCOUNTER — Ambulatory Visit (INDEPENDENT_AMBULATORY_CARE_PROVIDER_SITE_OTHER): Payer: Medicare Other | Admitting: Vascular Surgery

## 2017-05-26 VITALS — BP 149/76 | HR 66 | Resp 17 | Ht 60.0 in | Wt 169.6 lb

## 2017-05-26 DIAGNOSIS — M79604 Pain in right leg: Secondary | ICD-10-CM | POA: Diagnosis not present

## 2017-05-26 DIAGNOSIS — H539 Unspecified visual disturbance: Secondary | ICD-10-CM

## 2017-05-26 DIAGNOSIS — M79605 Pain in left leg: Secondary | ICD-10-CM

## 2017-05-26 DIAGNOSIS — M5136 Other intervertebral disc degeneration, lumbar region: Secondary | ICD-10-CM

## 2017-05-26 DIAGNOSIS — N184 Chronic kidney disease, stage 4 (severe): Secondary | ICD-10-CM

## 2017-05-26 DIAGNOSIS — R600 Localized edema: Secondary | ICD-10-CM | POA: Diagnosis not present

## 2017-05-26 DIAGNOSIS — M51369 Other intervertebral disc degeneration, lumbar region without mention of lumbar back pain or lower extremity pain: Secondary | ICD-10-CM

## 2017-05-26 NOTE — Progress Notes (Signed)
Subjective:    Patient ID: [name], female    DOB: 1946-01-30, 71 y.o.   MRN: [id number] Chief Complaint  Patient presents with  . Leg Swelling    NP,left leg   Presents as a new patient referred by Dr. Lavera Guise or evaluation of bilateral lower extremity edema and pain. Patient endorses a history of progressively worsening lower extremity edema over the last year. Patient states her edema is worse towards the end of the day. He patient experiences a discomfort associated with the edema. This discomfort has progressive the point she is unable to function on daily basis. The patient notes a cramping in her calf with activity. This has also progressively worsened over the last year. Patient with recent diagnosis of stage IV kidney disease. The patient does engage in conservative therapy. The patient wears medical grade 1 compression stockings and elevates her legs on a daily basis. She also remains active as possible. The patient describes a vague intermittent visual disturbance. The vision in her right eye becomes blurry at times. She has never undergone a carotid duplex to assess for any carotid disease. Patient denies any rest pain or ulceration to her lower extremity. The patient denies any DVT history. Patient denies any surgery to the lower extremity. She denies any fever, nausea vomiting.   Review of Systems  Constitutional: Negative.   HENT: Negative.   Eyes: Positive for visual disturbance.  Respiratory: Negative.   Cardiovascular: Positive for leg swelling.       Right lower extremity pain  Gastrointestinal: Negative.   Endocrine: Negative.   Genitourinary: Negative.   Musculoskeletal: Negative.   Skin: Negative.   Allergic/Immunologic: Negative.   Neurological: Negative.   Hematological: Negative.   Psychiatric/Behavioral: Negative.       Objective:   Physical Exam  Constitutional: She is oriented to person, place, and time. She appears well-developed and  well-nourished. No distress.  HENT:  Head: Normocephalic and atraumatic.  Eyes: Pupils are equal, round, and reactive to light. Conjunctivae are normal.  Neck: Normal range of motion.  No carotid bruits auscultated on exam  Cardiovascular: Normal rate, regular rhythm, normal heart sounds and intact distal pulses.   Pulses:      Radial pulses are 2+ on the right side, and 2+ on the left side.  hard to palpate pedal pulses  Pulmonary/Chest: Effort normal.  Musculoskeletal: Normal range of motion. She exhibits edema (right lower extremity: mild edema of lower extremity: moderate edema nonpitting).  Neurological: She is alert and oriented to person, place, and time.  Skin: Skin is warm and dry. She is not diaphoretic.  Skin is intact. There is no stasis dermatitis. There is no cellulitis. There is no skin changes.  Psychiatric: She has a normal mood and affect. Her behavior is normal. Judgment and thought content normal.  Vitals reviewed.  BP (!) 149/76 (BP Location: Right Arm)   Pulse 66   Resp 17   Ht 5' (1.524 m)   Wt 169 lb 9.6 oz (76.9 kg)   BMI 33.12 kg/m   Past Medical History:  Diagnosis Date  . Asthma    WELL CONTROLLED  . Bulging lumbar disc   . Cancer [facility]) 1999   thyroid with recurrent 2006  . Complication of anesthesia    HEART STOPPED DURING TONSILLECTOMY (AGE 22) DUE TO ALLERGY TO ETHER  . DDD (degenerative disc disease), lumbar   . DDD (degenerative disc disease), thoracolumbar   . Diverticulosis   . Family history  of adverse reaction to anesthesia    PTS FATHER-UNSURE WHAT HAPPENED  . GERD (gastroesophageal reflux disease)   . Heart murmur   . Hepatitis    AGE 87 "VIRAL"  . Hypertension    OFF MEDS CURRENTLY PER PCP (MASOUD)  . Hypothyroidism   . Interstitial cystitis   . PONV (postoperative nausea and vomiting)   . Thyroid disease    Social History   Social History  . Marital status: Married    Spouse name: N/A  . Number of children: N/A  . Years  of education: N/A   Occupational History  . Not on file.   Social History Main Topics  . Smoking status: Former Smoker    Packs/day: 1.00    Years: 15.00    Quit date: 11/25/1985  . Smokeless tobacco: Never Used  . Alcohol use No  . Drug use: No  . Sexual activity: No   Other Topics Concern  . Not on file   Social History Narrative  . No narrative on file   Past Surgical History:  Procedure Laterality Date  . BLADDER SURGERY  1984  . BREAST SURGERY  [id number]   mass removed  . BREAST SURGERY  April 2017   Dr Jack Quarto [facility]  . CATARACT EXTRACTION  2011  . CHOLECYSTECTOMY N/A 11/30/2015   Procedure: LAPAROSCOPIC CHOLECYSTECTOMY WITH INTRAOPERATIVE CHOLANGIOGRAM;  Surgeon: Christene Lye, MD;  Location: [facility] ORS;  Service: General;  Laterality: N/A;  . COLONOSCOPY  2005, 2015  . DILATION AND CURETTAGE OF UTERUS  1975  . EXCISIONAL HEMORRHOIDECTOMY  1975  . EYE SURGERY Left 2011  . [place]  . HERNIA REPAIR  2015  . THYROIDECTOMY  1999  . TONSILLECTOMY  1952   AGE 46   Family History  Problem Relation Age of Onset  . Heart disease Mother   . Cancer Father        colon  . Heart disease Brother   . Diabetes Maternal Aunt   . Asthma Paternal Aunt   . Bladder Cancer Maternal Aunt   . Ovarian cancer Neg Hx   . Kidney cancer Neg Hx   . Prostate cancer Neg Hx    Allergies  Allergen Reactions  . Pollen Extract Other (See Comments)  . Ether     "HEART STOPPED WHILE IN SURGERY"  . Gramineae Pollens Other (See Comments)  . Penicillins Hives  . Band-Aid Plus Antibiotic [Bacitracin-Polymyxin B] Rash      Assessment & Plan:  Presents as a new patient referred by Dr. Lavera Guise or evaluation of bilateral lower extremity edema and pain. Patient endorses a history of progressively worsening lower extremity edema over the last year. Patient states her edema is worse towards the end of the day. He patient experiences a discomfort associated with the edema. This  discomfort has progressive the point she is unable to function on daily basis. The patient notes a cramping in her calf with activity. This has also progressively worsened over the last year. Patient with recent diagnosis of stage IV kidney disease. The patient does engage in conservative therapy. The patient wears medical grade 1 compression stockings and elevates her legs on a daily basis. She also remains active as possible. The patient describes a vague intermittent visual disturbance. The vision in her right eye becomes blurry at times. She has never undergone a carotid duplex to assess for any carotid disease. Patient denies any rest pain or ulceration to her lower extremity. The patient denies  any DVT history. Patient denies any surgery to the lower extremity. She denies any fever, nausea vomiting.  1. Bilateral lower extremity edema - New Patient with progressively worsening bilateral lower extremity edema The patient engages in conservative therapy including wearing medical grade 1 compression stockings elevating her legs and remaining active And we'll bring the patient back to undergo a bilateral venous duplex exam assess if any venous disease is contributing. Patient to continue engaging in conservative therapy until that  - VAS [place] LOWER EXTREMITY VENOUS REFLUX; Future  2. Lower extremity pain, bilateral - New patient with bilateral calf claudication with activity Hard to palpate pedal pulses on exam Patient with multiple risk factors for peripheral artery disease Bring the patient back for an ABI to assess for any contributing peripheral artery disease  - VAS [place] ABI WITH/WO TBI; Future  3. Visual disturbance - New Patient with vague usual symptoms consisting of intermittent blurriness. Patient has never undergone a carotid duplex to assess for carotid disease Patient has multiple risk factors for carotid artery stenosis I will bring her back to undergo a carotid duplex to assess for  any contributing carotid disease  - VAS US CAROTID; Future  4. Stage 4 chronic kidney disease (HCC) - Stable This is a major contributor to the patient's bilateral lower extremity edema  5. Degeneration of intervertebral disc of lumbar region - Stable This is a major contributor to the patient's lower extremity pain  Current Outpatient Prescriptions on File Prior to Visit  Medication Sig Dispense Refill  . albuterol (PROVENTIL) (2.5 MG/3ML) 0.083% nebulizer solution Inhale into the lungs.    Marland Kitchen aspirin 81 MG tablet Take 81 mg by mouth daily.    . Calcium 500 MG CHEW Chew by mouth.    . cetirizine (ZYRTEC) 10 MG tablet Take 10 mg by mouth daily.    . Cholecalciferol (VITAMIN D-3) 1000 units CAPS Take by mouth daily.    . clonazePAM (KLONOPIN) 0.5 MG tablet Take 0.5 mg by mouth 2 (two) times daily as needed for anxiety.    . Flaxseed, Linseed, (FLAX SEED OIL) 1000 MG CAPS Take by mouth.    . Flaxseed, Linseed, (FLAXSEED OIL PO) Take by mouth.    . fluconazole (DIFLUCAN) 150 MG tablet Take by mouth.    Marland Kitchen FLUoxetine (PROZAC) 40 MG capsule Take 40 mg by mouth every morning.    . fluticasone (FLONASE) 50 MCG/ACT nasal spray Place 1 spray into both nostrils daily.    . Fluticasone-Salmeterol (ADVAIR) 250-50 MCG/DOSE AEPB Inhale 1 puff into the lungs 3 (three) times daily as needed.     . furosemide (LASIX) 20 MG tablet Take 40 mg by mouth daily.     . Glucosamine-Chondroitin 500-400 MG CAPS Take by mouth.    Marland Kitchen HYDROcodone-acetaminophen (NORCO) 7.5-325 MG tablet Take by mouth.    . levothyroxine (SYNTHROID, LEVOTHROID) 150 MCG tablet Take 125 mcg by mouth daily before breakfast.     . olmesartan (BENICAR) 20 MG tablet Take 20 mg by mouth as needed (IF BP IS ELEVATED).    . pantoprazole (PROTONIX) 40 MG tablet Take 1 tablet by mouth every morning.     . potassium chloride SA (K-DUR,KLOR-CON) 20 MEQ tablet TK 1 T PO QD  2  . predniSONE (DELTASONE) 5 MG tablet Take 5 mg by mouth daily with  breakfast.    . PRESCRIPTION MEDICATION Allergy injections once weekly    . raloxifene (EVISTA) 60 MG tablet   10  . tiZANidine (ZANAFLEX)  4 MG capsule Take 4 mg by mouth as needed.     . vitamin C (ASCORBIC ACID) 500 MG tablet Take by mouth.    . zolpidem (AMBIEN) 10 MG tablet Take 10 mg by mouth at bedtime as needed for sleep.    . ALBUTEROL IN Inhale into the lungs as needed.    . celecoxib (CELEBREX) 200 MG capsule Take by mouth.    . cycloSPORINE (RESTASIS) 0.05 % ophthalmic emulsion 1 drop 2 (two) times daily.    Marland Kitchen doxycycline (VIBRAMYCIN) 100 MG capsule Take 100 mg by mouth 2 (two) times daily.    . enalapril (VASOTEC) 10 MG tablet Take 20 mg by mouth daily.     Marland Kitchen gabapentin (NEURONTIN) 100 MG capsule TAKE 2 CAPSULES BY MOUTH TWICE DAILY    . hydrocortisone (ANUSOL-HC) 25 MG suppository Place 1 suppository (25 mg total) rectally 2 (two) times daily. (Patient not taking: Reported on 04/25/2017) 12 suppository 0  . mirabegron ER (MYRBETRIQ) 50 MG TB24 tablet Take 1 tablet (50 mg total) by mouth daily. (Patient not taking: Reported on 04/25/2017) 30 tablet 12  . nitrofurantoin, macrocrystal-monohydrate, (MACROBID) 100 MG capsule Take 1 capsule (100 mg total) by mouth 2 (two) times daily. (Patient not taking: Reported on 03/28/2017) 14 capsule 0  . tamsulosin (FLOMAX) 0.4 MG CAPS capsule Take 1 capsule (0.4 mg total) by mouth daily. (Patient not taking: Reported on 04/25/2017) 30 capsule 11   No current facility-administered medications on file prior to visit.     There are no Patient Instructions on file for this visit. No Follow-up on file.   Yechezkel Fertig A Tearah Saulsbury, PA-C

## 2017-05-30 DIAGNOSIS — E038 Other specified hypothyroidism: Secondary | ICD-10-CM | POA: Diagnosis not present

## 2017-05-30 DIAGNOSIS — N184 Chronic kidney disease, stage 4 (severe): Secondary | ICD-10-CM | POA: Diagnosis not present

## 2017-05-30 DIAGNOSIS — N200 Calculus of kidney: Secondary | ICD-10-CM | POA: Diagnosis not present

## 2017-05-30 DIAGNOSIS — E669 Obesity, unspecified: Secondary | ICD-10-CM | POA: Diagnosis not present

## 2017-06-01 DIAGNOSIS — N200 Calculus of kidney: Secondary | ICD-10-CM | POA: Diagnosis not present

## 2017-06-01 DIAGNOSIS — J3089 Other allergic rhinitis: Secondary | ICD-10-CM | POA: Diagnosis not present

## 2017-06-01 DIAGNOSIS — N183 Chronic kidney disease, stage 3 (moderate): Secondary | ICD-10-CM | POA: Diagnosis not present

## 2017-06-01 DIAGNOSIS — N390 Urinary tract infection, site not specified: Secondary | ICD-10-CM | POA: Diagnosis not present

## 2017-06-01 DIAGNOSIS — I10 Essential (primary) hypertension: Secondary | ICD-10-CM | POA: Diagnosis not present

## 2017-06-01 DIAGNOSIS — N179 Acute kidney failure, unspecified: Secondary | ICD-10-CM | POA: Diagnosis not present

## 2017-06-01 DIAGNOSIS — J301 Allergic rhinitis due to pollen: Secondary | ICD-10-CM | POA: Diagnosis not present

## 2017-06-08 DIAGNOSIS — J3089 Other allergic rhinitis: Secondary | ICD-10-CM | POA: Diagnosis not present

## 2017-06-08 DIAGNOSIS — J301 Allergic rhinitis due to pollen: Secondary | ICD-10-CM | POA: Diagnosis not present

## 2017-06-23 DIAGNOSIS — J301 Allergic rhinitis due to pollen: Secondary | ICD-10-CM | POA: Diagnosis not present

## 2017-06-23 DIAGNOSIS — J3089 Other allergic rhinitis: Secondary | ICD-10-CM | POA: Diagnosis not present

## 2017-06-29 DIAGNOSIS — E669 Obesity, unspecified: Secondary | ICD-10-CM | POA: Diagnosis not present

## 2017-06-29 DIAGNOSIS — I10 Essential (primary) hypertension: Secondary | ICD-10-CM | POA: Diagnosis not present

## 2017-06-29 DIAGNOSIS — J329 Chronic sinusitis, unspecified: Secondary | ICD-10-CM | POA: Diagnosis not present

## 2017-06-29 DIAGNOSIS — E034 Atrophy of thyroid (acquired): Secondary | ICD-10-CM | POA: Diagnosis not present

## 2017-06-29 DIAGNOSIS — N184 Chronic kidney disease, stage 4 (severe): Secondary | ICD-10-CM | POA: Diagnosis not present

## 2017-07-06 DIAGNOSIS — J301 Allergic rhinitis due to pollen: Secondary | ICD-10-CM | POA: Diagnosis not present

## 2017-07-06 DIAGNOSIS — J3089 Other allergic rhinitis: Secondary | ICD-10-CM | POA: Diagnosis not present

## 2017-07-11 DIAGNOSIS — N390 Urinary tract infection, site not specified: Secondary | ICD-10-CM | POA: Diagnosis not present

## 2017-07-11 DIAGNOSIS — N183 Chronic kidney disease, stage 3 (moderate): Secondary | ICD-10-CM | POA: Diagnosis not present

## 2017-07-11 DIAGNOSIS — I10 Essential (primary) hypertension: Secondary | ICD-10-CM | POA: Diagnosis not present

## 2017-07-13 DIAGNOSIS — J301 Allergic rhinitis due to pollen: Secondary | ICD-10-CM | POA: Diagnosis not present

## 2017-07-13 DIAGNOSIS — J3089 Other allergic rhinitis: Secondary | ICD-10-CM | POA: Diagnosis not present

## 2017-07-21 DIAGNOSIS — J3081 Allergic rhinitis due to animal (cat) (dog) hair and dander: Secondary | ICD-10-CM | POA: Diagnosis not present

## 2017-07-21 DIAGNOSIS — J3089 Other allergic rhinitis: Secondary | ICD-10-CM | POA: Diagnosis not present

## 2017-07-21 DIAGNOSIS — J301 Allergic rhinitis due to pollen: Secondary | ICD-10-CM | POA: Diagnosis not present

## 2017-07-27 DIAGNOSIS — J301 Allergic rhinitis due to pollen: Secondary | ICD-10-CM | POA: Diagnosis not present

## 2017-07-27 DIAGNOSIS — J3089 Other allergic rhinitis: Secondary | ICD-10-CM | POA: Diagnosis not present

## 2017-07-27 DIAGNOSIS — I10 Essential (primary) hypertension: Secondary | ICD-10-CM | POA: Insufficient documentation

## 2017-07-27 NOTE — Progress Notes (Signed)
MRN : [id number]  [name] is a 72 y.o. (November 16, 1945) female who presents with chief complaint of  Chief Complaint  Patient presents with  . Follow-up    Follow up ABI and Reflux u/s  .  History of Present Illness: Patient returns in follow-up of multiple vascular issues.  She says in general, her legs may be doing a little bit better but she is still struggling [place] and heaviness in the legs.  She has not had any focal neurologic deficits.  Her carotid duplex showed minimal plaque with 1-39% carotid artery stenosis bilaterally.  Her lower extremity arterial studies were normal with no evidence of arterial insufficiency.  Her venous duplex showed no DVT or superficial thrombophlebitis, but significant venous reflux was found in both great saphenous veins and femoral veins.   Current Outpatient Medications  Medication Sig Dispense Refill  . albuterol (PROVENTIL) (2.5 MG/3ML) 0.083% nebulizer solution Inhale into the lungs.    . ALBUTEROL IN Inhale into the lungs as needed.    Marland Kitchen aspirin 81 MG tablet Take 81 mg by mouth daily.    . Calcium 500 MG CHEW Chew by mouth.    . celecoxib (CELEBREX) 200 MG capsule Take by mouth.    . cetirizine (ZYRTEC) 10 MG tablet Take 10 mg by mouth daily.    . Cholecalciferol (VITAMIN D-3) 1000 units CAPS Take by mouth daily.    . clonazePAM (KLONOPIN) 0.5 MG tablet Take 0.5 mg by mouth 2 (two) times daily as needed for anxiety.    . cycloSPORINE (RESTASIS) 0.05 % ophthalmic emulsion 1 drop 2 (two) times daily.    Marland Kitchen doxycycline (VIBRAMYCIN) 100 MG capsule Take 100 mg by mouth 2 (two) times daily.    . enalapril (VASOTEC) 10 MG tablet Take 20 mg by mouth daily.     . Flaxseed, Linseed, (FLAX SEED OIL) 1000 MG CAPS Take by mouth.    . Flaxseed, Linseed, (FLAXSEED OIL PO) Take by mouth.    . fluconazole (DIFLUCAN) 150 MG tablet Take by mouth.    Marland Kitchen FLUoxetine (PROZAC) 40 MG capsule Take 40 mg by mouth every morning.    . fluticasone (FLONASE) 50  MCG/ACT nasal spray Place 1 spray into both nostrils daily.    . Fluticasone-Salmeterol (ADVAIR) 250-50 MCG/DOSE AEPB Inhale 1 puff into the lungs 3 (three) times daily as needed.     . furosemide (LASIX) 20 MG tablet Take 40 mg by mouth daily.     Marland Kitchen gabapentin (NEURONTIN) 100 MG capsule TAKE 2 CAPSULES BY MOUTH TWICE DAILY    . Glucosamine-Chondroitin 500-400 MG CAPS Take by mouth.    Marland Kitchen HYDROcodone-acetaminophen (NORCO) 7.5-325 MG tablet Take by mouth.    . levothyroxine (SYNTHROID, LEVOTHROID) 150 MCG tablet Take 125 mcg by mouth daily before breakfast.     . olmesartan (BENICAR) 20 MG tablet Take 20 mg by mouth as needed (IF BP IS ELEVATED).    . pantoprazole (PROTONIX) 40 MG tablet Take 1 tablet by mouth every morning.     . potassium chloride SA (K-DUR,KLOR-CON) 20 MEQ tablet TK 1 T PO QD  2  . predniSONE (DELTASONE) 5 MG tablet Take 5 mg by mouth daily with breakfast.    . pregabalin (LYRICA) 50 MG capsule Take by mouth.    Marland Kitchen PRESCRIPTION MEDICATION Allergy injections once weekly    . raloxifene (EVISTA) 60 MG tablet   10  . tiZANidine (ZANAFLEX) 4 MG capsule Take 4 mg by mouth as needed.     Marland Kitchen  vitamin C (ASCORBIC ACID) 500 MG tablet Take by mouth.    . zolpidem (AMBIEN) 10 MG tablet Take 10 mg by mouth at bedtime as needed for sleep.    . hydrocortisone (ANUSOL-HC) 25 MG suppository Place 1 suppository (25 mg total) rectally 2 (two) times daily. (Patient not taking: Reported on 04/25/2017) 12 suppository 0  . mirabegron ER (MYRBETRIQ) 50 MG TB24 tablet Take 1 tablet (50 mg total) by mouth daily. (Patient not taking: Reported on 04/25/2017) 30 tablet 12  . tamsulosin (FLOMAX) 0.4 MG CAPS capsule Take 1 capsule (0.4 mg total) by mouth daily. (Patient not taking: Reported on 04/25/2017) 30 capsule 11   No current facility-administered medications for this visit.     Past Medical History:  Diagnosis Date  . Asthma    WELL CONTROLLED  . Bulging lumbar disc   . Cancer [facility]) 1999   thyroid  with recurrent 2006  . Complication of anesthesia    HEART STOPPED DURING TONSILLECTOMY (AGE 34) DUE TO ALLERGY TO ETHER  . DDD (degenerative disc disease), lumbar   . DDD (degenerative disc disease), thoracolumbar   . Diverticulosis   . Family history of adverse reaction to anesthesia    PTS FATHER-UNSURE WHAT HAPPENED  . GERD (gastroesophageal reflux disease)   . Heart murmur   . Hepatitis    AGE 1 "VIRAL"  . Hypertension    OFF MEDS CURRENTLY PER PCP (MASOUD)  . Hypothyroidism   . Interstitial cystitis   . PONV (postoperative nausea and vomiting)   . Thyroid disease     Past Surgical History:  Procedure Laterality Date  . BLADDER SURGERY  1984  . BREAST SURGERY  [id number]   mass removed  . BREAST SURGERY  April 2017   Dr Jack Quarto [facility]  . CATARACT EXTRACTION  2011  . CHOLECYSTECTOMY N/A 11/30/2015   Procedure: LAPAROSCOPIC CHOLECYSTECTOMY WITH INTRAOPERATIVE CHOLANGIOGRAM;  Surgeon: Christene Lye, MD;  Location: [facility] ORS;  Service: General;  Laterality: N/A;  . COLONOSCOPY  2005, 2015  . DILATION AND CURETTAGE OF UTERUS  1975  . EXCISIONAL HEMORRHOIDECTOMY  1975  . EYE SURGERY Left 2011  . [name]  . HERNIA REPAIR  2015  . THYROIDECTOMY  1999  . TONSILLECTOMY  1952   AGE 34    Social History Social History   Tobacco Use  . Smoking status: Former Smoker    Packs/day: 1.00    Years: 15.00    Pack years: 15.00    Last attempt to quit: 11/25/1985    Years since quitting: 31.6  . Smokeless tobacco: Never Used  Substance Use Topics  . Alcohol use: No  . Drug use: No     Family History Family History  Problem Relation Age of Onset  . Heart disease Mother   . Cancer Father        colon  . Heart disease Brother   . Diabetes Maternal Aunt   . Asthma Paternal Aunt   . Bladder Cancer Maternal Aunt   . Ovarian cancer Neg Hx   . Kidney cancer Neg Hx   . Prostate cancer Neg Hx     Allergies  Allergen Reactions  . Pollen Extract Other  (See Comments)  . Ether     "HEART STOPPED WHILE IN SURGERY"  . Gramineae Pollens Other (See Comments)  . Penicillins Hives  . Tamsulosin     Other reaction(s): Other (See Comments) "made me loopy"  . Band-Aid Plus Antibiotic [Bacitracin-Polymyxin B] Rash  REVIEW OF SYSTEMS (Negative unless checked)  Constitutional: [] Weight loss  [] Fever  [] Chills Cardiac: [] Chest pain   [] Chest pressure   [] Palpitations   [] Shortness of breath when laying flat   [] Shortness of breath at rest   [] Shortness of breath with exertion. Vascular:  [] Pain in legs with walking   [] Pain in legs at rest   [] Pain in legs when laying flat   [] Claudication   [] Pain in feet when walking  [] Pain in feet at rest  [] Pain in feet when laying flat   [] History of DVT   [] Phlebitis   [x] Swelling in legs   [x] Varicose veins   [] Non-healing ulcers Pulmonary:   [] Uses home oxygen   [] Productive cough   [] Hemoptysis   [] Wheeze  [] COPD   [] Asthma Neurologic:  [x] Dizziness  [] Blackouts   [] Seizures   [] History of stroke   [] History of TIA  [] Aphasia   [] Temporary blindness   [] Dysphagia   [] Weakness or numbness in arms   [] Weakness or numbness in legs Musculoskeletal:  [x] Arthritis   [] Joint swelling   [] Joint pain   [] Low back pain Hematologic:  [] Easy bruising  [] Easy bleeding   [] Hypercoagulable state   [] Anemic  [] Hepatitis Gastrointestinal:  [] Blood in stool   [] Vomiting blood  [x] Gastroesophageal reflux/heartburn   [] Difficulty swallowing. Genitourinary:  [x] Chronic kidney disease   [] Difficult urination  [] Frequent urination  [] Burning with urination   [] Blood in urine Skin:  [] Rashes   [] Ulcers   [] Wounds Psychological:  [] History of anxiety   []  History of major depression.  Physical Examination  Vitals:   07/28/17 1008 07/28/17 1009  BP: (!) 169/84 (!) 156/83  Pulse: 60 (!) 59  Resp: 17   Weight: 77.1 kg (170 lb)   Height: 5\' 1"  (1.549 m)    Body mass index is 32.12 kg/m. Gen:  WD/WN, NAD Head: [place]/AT, No  temporalis wasting. Ear/Nose/Throat: Hearing grossly intact, nares w/o erythema or drainage, trachea midline Eyes: Conjunctiva clear. Sclera non-icteric Neck: Supple.  No JVD.  Pulmonary:  Good air movement, respirations not labored Cardiac: RRR, no JVD Vascular:  Vessel Right Left  Radial Palpable Palpable                                    Musculoskeletal: M/S 5/5 throughout.  No deformity or atrophy.  Mild bilateral lower extremity edema. Neurologic: CN 2-12 intact. Sensation grossly intact in extremities.  Symmetrical.  Speech is fluent. Motor exam as listed above. Psychiatric: Judgment intact, Mood & affect appropriate for pt's clinical situation. Dermatologic: No rashes or ulcers noted.  No cellulitis or open wounds.      CBC Lab Results  Component Value Date   WBC 9.9 03/06/2017   HGB 13.5 03/06/2017   HCT 40.2 03/06/2017   MCV 89.6 03/06/2017   PLT 320 03/06/2017    BMET    Component Value Date/Time   NA 135 03/06/2017 1555   NA 137 07/08/2014 1631   K 4.6 03/06/2017 1555   K 3.7 07/08/2014 1631   CL 101 03/06/2017 1555   CL 104 07/08/2014 1631   CO2 25 03/06/2017 1555   CO2 25 07/08/2014 1631   GLUCOSE 112 (H) 03/06/2017 1555   GLUCOSE 108 (H) 07/08/2014 1631   BUN 35 (H) 03/06/2017 1555   BUN 13 07/08/2014 1631   CREATININE 1.42 (H) 03/06/2017 1555   CREATININE 1.00 07/08/2014 1631   CALCIUM 9.5 03/06/2017 1555  CALCIUM 9.5 07/08/2014 1631   GFRNONAA 36 (L) 03/06/2017 1555   GFRNONAA 58 (L) 07/08/2014 1631   GFRAA 42 (L) 03/06/2017 1555   GFRAA >60 07/08/2014 1631   CrCl cannot be calculated (Patient's most recent lab result is older than the maximum 21 days allowed.).  COAG No results found for: INR, PROTIME  Radiology No results found.    Assessment/Plan Hypertension blood pressure control important in reducing the progression of atherosclerotic disease.   Stage 4 chronic kidney disease ([place]) Could certainly contribute to LE  edema  Lower extremity pain, bilateral Likely a combination of venous disease and neuropathy.  Patient is going to consider laser ablation of both great saphenous veins but at this time is unsure if she would like to proceed with this.  Bilateral lower extremity edema Likely a combination of multiple medical issues and venous disease.  Patient is going to consider laser ablation of both great saphenous veins, but at this time is unsure if she would like to proceed with this.  Chronic venous insufficiency   Her venous duplex showed no DVT or superficial thrombophlebitis, but significant venous reflux was found in both great saphenous veins and femoral veins.  She should continue wearing her compression stockings and elevating her legs.  She has been doing that for many years.  I have discussed the role of laser ablation for treatment great saphenous veins bilaterally.  I discussed the risks and benefits the procedure.  The patient is going to go home and discuss this with her family and consider her options and we will obtain preapproval if she would like to proceed.    Leotis Pain, MD  07/28/2017 11:42 AM    This note was created with Dragon medical transcription system.  Any errors from dictation are purely unintentional

## 2017-07-27 NOTE — Assessment & Plan Note (Signed)
blood pressure control important in reducing the progression of atherosclerotic disease.   

## 2017-07-27 NOTE — Assessment & Plan Note (Signed)
Could certainly contribute to LE edema

## 2017-07-28 ENCOUNTER — Ambulatory Visit (INDEPENDENT_AMBULATORY_CARE_PROVIDER_SITE_OTHER): Payer: Medicare Other | Admitting: Vascular Surgery

## 2017-07-28 ENCOUNTER — Encounter (INDEPENDENT_AMBULATORY_CARE_PROVIDER_SITE_OTHER): Payer: Self-pay | Admitting: Vascular Surgery

## 2017-07-28 ENCOUNTER — Ambulatory Visit (INDEPENDENT_AMBULATORY_CARE_PROVIDER_SITE_OTHER): Payer: Medicare Other

## 2017-07-28 VITALS — BP 156/83 | HR 59 | Resp 17 | Ht 61.0 in | Wt 170.0 lb

## 2017-07-28 DIAGNOSIS — M79605 Pain in left leg: Secondary | ICD-10-CM

## 2017-07-28 DIAGNOSIS — R600 Localized edema: Secondary | ICD-10-CM

## 2017-07-28 DIAGNOSIS — M79604 Pain in right leg: Secondary | ICD-10-CM | POA: Diagnosis not present

## 2017-07-28 DIAGNOSIS — N184 Chronic kidney disease, stage 4 (severe): Secondary | ICD-10-CM

## 2017-07-28 DIAGNOSIS — I10 Essential (primary) hypertension: Secondary | ICD-10-CM

## 2017-07-28 DIAGNOSIS — I872 Venous insufficiency (chronic) (peripheral): Secondary | ICD-10-CM | POA: Diagnosis not present

## 2017-07-28 DIAGNOSIS — H539 Unspecified visual disturbance: Secondary | ICD-10-CM | POA: Diagnosis not present

## 2017-07-28 LAB — VAS US CAROTID
LCCADDIAS: -22 cm/s
LCCADSYS: -92 cm/s
LCCAPDIAS: 17 cm/s
LEFT ECA DIAS: -12 cm/s
LEFT VERTEBRAL DIAS: 16 cm/s
LICADDIAS: -27 cm/s
LICAPSYS: -98 cm/s
Left CCA prox sys: 84 cm/s
Left ICA dist sys: -100 cm/s
Left ICA prox dias: -34 cm/s
RIGHT CCA MID DIAS: -21 cm/s
RIGHT ECA DIAS: -16 cm/s
RIGHT VERTEBRAL DIAS: 10 cm/s
Right CCA prox dias: 24 cm/s
Right CCA prox sys: 103 cm/s
Right cca dist sys: -64 cm/s

## 2017-07-28 NOTE — Patient Instructions (Signed)
Nonsurgical Procedures for Varicose Veins, Care After This sheet gives you information about how to care for yourself after your procedure. Your health care provider may also give you more specific instructions. If you have problems or questions, contact your health care provider. What can I expect after the procedure? After the procedure, it is common to have:  Swelling.  Bruising.  Soreness.  Mild skin discoloration.  Slight bleeding at the incision sites.  Follow these instructions at home: Incision or puncture site care  Follow instructions from your health care provider about how to take care of your incision or puncture site. Make sure you: ? Wash your hands with soap and water before you change your bandage (dressing). If soap and water are not available, use hand sanitizer. ? Change your dressing as told by your health care provider. ? Leave skin glue or adhesive strips in place. These skin closures may need to stay in place for 2 weeks or longer. If adhesive strip edges start to loosen and curl up, you may trim the loose edges. Do not remove adhesive strips completely unless your health care provider tells you to do that.  Check your incision or puncture area every day for signs of infection. Check for: ? Redness, swelling, or pain. ? Fluid or blood. ? Warmth. ? Pus or a bad smell. General instructions  Take over-the-counter and prescription medicines only as told by your health care provider.  Wear compression stockings as told by your health care provider. These stockings help to prevent blood clots and reduce swelling in your legs.  Do not take baths, swim, or use a hot tub until your health care provider approves. Ask your health care provider if you can take showers.  Wear loose-fitting clothing.  Return to your normal activities as told by your health care provider. Ask your health care provider what activities are safe for you.  Get regular daily exercise. Walk  or ride a stationary bike daily or as told by your health care provider.  Keep all follow-up visits as told by your health care provider. This is important. Contact a health care provider if:  You have a fever.  You have redness, swelling, or pain around your incision or puncture site.  You have fluid or blood coming from your incision or puncture site.  Your incision or puncture site feels warm to the touch.  You have pus or a bad smell coming from your incision or puncture site.  You develop a cough. Get help right away if:  You pass out.  You have very bad pain in your leg.  You have leg pain that gets worse when you walk.  You have redness or swelling in your leg that is getting worse.  You have trouble breathing.  You cough up blood. Summary  After the procedure, it is common to have swelling, bruising, soreness, or mild skin discoloration.  Follow instructions from your health care provider about how to take care of your incision or puncture site.  Wear compression stockings as told by your health care provider. These stockings help to prevent blood clots and reduce swelling in your legs. This information is not intended to replace advice given to you by your health care provider. Make sure you discuss any questions you have with your health care provider. Document Released: 10/21/2016 Document Revised: 10/21/2016 Document Reviewed: 10/21/2016 Elsevier Interactive Patient Education  2018 [employer].

## 2017-07-28 NOTE — Assessment & Plan Note (Signed)
Likely a combination of venous disease and neuropathy.  Patient is going to consider laser ablation of both great saphenous veins but at this time is unsure if she would like to proceed with this.

## 2017-07-28 NOTE — Assessment & Plan Note (Signed)
Likely a combination of multiple medical issues and venous disease.  Patient is going to consider laser ablation of both great saphenous veins, but at this time is unsure if she would like to proceed with this.

## 2017-07-28 NOTE — Assessment & Plan Note (Signed)
Her venous duplex showed no DVT or superficial thrombophlebitis, but significant venous reflux was found in both great saphenous veins and femoral veins.  She should continue wearing her compression stockings and elevating her legs.  She has been doing that for many years.  I have discussed the role of laser ablation for treatment great saphenous veins bilaterally.  I discussed the risks and benefits the procedure.  The patient is going to go home and discuss this with her family and consider her options and we will obtain preapproval if she would like to proceed.

## 2017-08-04 DIAGNOSIS — J3089 Other allergic rhinitis: Secondary | ICD-10-CM | POA: Diagnosis not present

## 2017-08-04 DIAGNOSIS — J301 Allergic rhinitis due to pollen: Secondary | ICD-10-CM | POA: Diagnosis not present

## 2017-08-08 DIAGNOSIS — J301 Allergic rhinitis due to pollen: Secondary | ICD-10-CM | POA: Diagnosis not present

## 2017-08-08 DIAGNOSIS — J3089 Other allergic rhinitis: Secondary | ICD-10-CM | POA: Diagnosis not present

## 2017-08-11 DIAGNOSIS — J301 Allergic rhinitis due to pollen: Secondary | ICD-10-CM | POA: Diagnosis not present

## 2017-08-11 DIAGNOSIS — J3089 Other allergic rhinitis: Secondary | ICD-10-CM | POA: Diagnosis not present

## 2017-08-15 DIAGNOSIS — J3089 Other allergic rhinitis: Secondary | ICD-10-CM | POA: Diagnosis not present

## 2017-08-15 DIAGNOSIS — J301 Allergic rhinitis due to pollen: Secondary | ICD-10-CM | POA: Diagnosis not present

## 2017-08-16 DIAGNOSIS — M5136 Other intervertebral disc degeneration, lumbar region: Secondary | ICD-10-CM | POA: Diagnosis not present

## 2017-08-16 DIAGNOSIS — M5416 Radiculopathy, lumbar region: Secondary | ICD-10-CM | POA: Diagnosis not present

## 2017-08-21 DIAGNOSIS — E669 Obesity, unspecified: Secondary | ICD-10-CM | POA: Diagnosis not present

## 2017-08-21 DIAGNOSIS — J449 Chronic obstructive pulmonary disease, unspecified: Secondary | ICD-10-CM | POA: Diagnosis not present

## 2017-08-21 DIAGNOSIS — N184 Chronic kidney disease, stage 4 (severe): Secondary | ICD-10-CM | POA: Diagnosis not present

## 2017-08-21 DIAGNOSIS — R079 Chest pain, unspecified: Secondary | ICD-10-CM | POA: Diagnosis not present

## 2017-08-22 DIAGNOSIS — E038 Other specified hypothyroidism: Secondary | ICD-10-CM | POA: Diagnosis not present

## 2017-08-22 DIAGNOSIS — E669 Obesity, unspecified: Secondary | ICD-10-CM | POA: Diagnosis not present

## 2017-08-22 DIAGNOSIS — J3089 Other allergic rhinitis: Secondary | ICD-10-CM | POA: Diagnosis not present

## 2017-08-22 DIAGNOSIS — J449 Chronic obstructive pulmonary disease, unspecified: Secondary | ICD-10-CM | POA: Diagnosis not present

## 2017-08-22 DIAGNOSIS — J301 Allergic rhinitis due to pollen: Secondary | ICD-10-CM | POA: Diagnosis not present

## 2017-08-22 DIAGNOSIS — R079 Chest pain, unspecified: Secondary | ICD-10-CM | POA: Diagnosis not present

## 2017-08-22 DIAGNOSIS — N184 Chronic kidney disease, stage 4 (severe): Secondary | ICD-10-CM | POA: Diagnosis not present

## 2017-08-24 DIAGNOSIS — E669 Obesity, unspecified: Secondary | ICD-10-CM | POA: Diagnosis not present

## 2017-08-24 DIAGNOSIS — J449 Chronic obstructive pulmonary disease, unspecified: Secondary | ICD-10-CM | POA: Diagnosis not present

## 2017-08-24 DIAGNOSIS — J209 Acute bronchitis, unspecified: Secondary | ICD-10-CM | POA: Diagnosis not present

## 2017-08-24 DIAGNOSIS — N184 Chronic kidney disease, stage 4 (severe): Secondary | ICD-10-CM | POA: Diagnosis not present

## 2017-08-28 NOTE — Progress Notes (Signed)
ANNUAL PREVENTATIVE CARE GYN  ENCOUNTER NOTE  Subjective:       [name] is a 72 y.o. G2P2 female here for a routine annual gynecologic exam.  Current complaints: 1.  Well Woman exam  2. Vaginal spotting 2 nights ago after insertion monistat; patient is asymptomatic at this time and is not experiencing any vaginal itching or burning or discharge.  Colonoscopy performed 3 years ago. Patient is taking Evista 60 mg daily.  She is also taking calcium and vitamin D supplementation. She does have her screening mammogram scheduled in [facility] in March 2019.  Gynecologic History No LMP recorded. Patient is postmenopausal. Contraception: post menopausal status Last Pap: . 2018 neg Results were: normal Last mammogram: .10/2015 thru surgeon Margarita Rana) Results were: normal  Obstetric History OB History  Gravida Para Term Preterm AB Living  2 2 2     2   SAB TAB Ectopic Multiple Live Births          2    # Outcome Date GA Lbr Len/2nd Weight Sex Delivery Anes PTL Lv  2 Term     M Vag-Spont   LIV  1 Term     F Vag-Spont   LIV    Obstetric Comments  1st Menstrual Cycle:  10  1st Pregnancy:  21    Past Medical History:  Diagnosis Date  . Asthma    WELL CONTROLLED  . Bulging lumbar disc   . Cancer [facility]) 1999   thyroid with recurrent 2006  . Complication of anesthesia    HEART STOPPED DURING TONSILLECTOMY (AGE 89) DUE TO ALLERGY TO ETHER  . DDD (degenerative disc disease), lumbar   . DDD (degenerative disc disease), thoracolumbar   . Diverticulosis   . Family history of adverse reaction to anesthesia    PTS FATHER-UNSURE WHAT HAPPENED  . GERD (gastroesophageal reflux disease)   . Heart murmur   . Hepatitis    AGE 64 "VIRAL"  . Hypertension    OFF MEDS CURRENTLY PER PCP (MASOUD)  . Hypothyroidism   . Interstitial cystitis   . PONV (postoperative nausea and vomiting)   . Thyroid disease     Past Surgical History:  Procedure Laterality Date  . BLADDER SURGERY  1984   . BREAST SURGERY  [id number]   mass removed  . BREAST SURGERY  April 2017   [facility]  . CATARACT EXTRACTION  2011  . CHOLECYSTECTOMY N/A 11/30/2015   Procedure: LAPAROSCOPIC CHOLECYSTECTOMY WITH INTRAOPERATIVE CHOLANGIOGRAM;  Surgeon: Christene Lye, MD;  Location: [facility] ORS;  Service: General;  Laterality: N/A;  . COLONOSCOPY  2005, 2015  . DILATION AND CURETTAGE OF UTERUS  1975  . EXCISIONAL HEMORRHOIDECTOMY  1975  . EYE SURGERY Left 2011  . [place]  . HERNIA REPAIR  2015  . THYROIDECTOMY  1999  . TONSILLECTOMY  1952   AGE 89    Current Outpatient Medications on File Prior to Visit  Medication Sig Dispense Refill  . albuterol (PROVENTIL) (2.5 MG/3ML) 0.083% nebulizer solution Inhale into the lungs.    . ALBUTEROL IN Inhale into the lungs as needed.    Marland Kitchen aspirin 81 MG tablet Take 81 mg by mouth daily.    . Calcium 500 MG CHEW Chew by mouth.    . celecoxib (CELEBREX) 200 MG capsule Take by mouth.    . cetirizine (ZYRTEC) 10 MG tablet Take 10 mg by mouth daily.    . Cholecalciferol (VITAMIN D-3) 1000 units CAPS Take by  mouth daily.    . clonazePAM (KLONOPIN) 0.5 MG tablet Take 0.5 mg by mouth 2 (two) times daily as needed for anxiety.    . cycloSPORINE (RESTASIS) 0.05 % ophthalmic emulsion 1 drop 2 (two) times daily.    Marland Kitchen doxycycline (VIBRAMYCIN) 100 MG capsule Take 100 mg by mouth 2 (two) times daily.    . enalapril (VASOTEC) 10 MG tablet Take 20 mg by mouth daily.     . Flaxseed, Linseed, (FLAX SEED OIL) 1000 MG CAPS Take by mouth.    . Flaxseed, Linseed, (FLAXSEED OIL PO) Take by mouth.    . fluconazole (DIFLUCAN) 150 MG tablet Take by mouth.    Marland Kitchen FLUoxetine (PROZAC) 40 MG capsule Take 40 mg by mouth every morning.    . fluticasone (FLONASE) 50 MCG/ACT nasal spray Place 1 spray into both nostrils daily.    . Fluticasone-Salmeterol (ADVAIR) 250-50 MCG/DOSE AEPB Inhale 1 puff into the lungs 3 (three) times daily as needed.     . furosemide (LASIX) 20  MG tablet Take 40 mg by mouth daily.     Marland Kitchen gabapentin (NEURONTIN) 100 MG capsule TAKE 2 CAPSULES BY MOUTH TWICE DAILY    . Glucosamine-Chondroitin 500-400 MG CAPS Take by mouth.    Marland Kitchen HYDROcodone-acetaminophen (NORCO) 7.5-325 MG tablet Take by mouth.    . hydrocortisone (ANUSOL-HC) 25 MG suppository Place 1 suppository (25 mg total) rectally 2 (two) times daily. (Patient not taking: Reported on 04/25/2017) 12 suppository 0  . levothyroxine (SYNTHROID, LEVOTHROID) 150 MCG tablet Take 125 mcg by mouth daily before breakfast.     . mirabegron ER (MYRBETRIQ) 50 MG TB24 tablet Take 1 tablet (50 mg total) by mouth daily. (Patient not taking: Reported on 04/25/2017) 30 tablet 12  . olmesartan (BENICAR) 20 MG tablet Take 20 mg by mouth as needed (IF BP IS ELEVATED).    . pantoprazole (PROTONIX) 40 MG tablet Take 1 tablet by mouth every morning.     . potassium chloride SA (K-DUR,KLOR-CON) 20 MEQ tablet TK 1 T PO QD  2  . predniSONE (DELTASONE) 5 MG tablet Take 5 mg by mouth daily with breakfast.    . pregabalin (LYRICA) 50 MG capsule Take by mouth.    Marland Kitchen PRESCRIPTION MEDICATION Allergy injections once weekly    . raloxifene (EVISTA) 60 MG tablet   10  . tamsulosin (FLOMAX) 0.4 MG CAPS capsule Take 1 capsule (0.4 mg total) by mouth daily. (Patient not taking: Reported on 04/25/2017) 30 capsule 11  . tiZANidine (ZANAFLEX) 4 MG capsule Take 4 mg by mouth as needed.     . vitamin C (ASCORBIC ACID) 500 MG tablet Take by mouth.    . zolpidem (AMBIEN) 10 MG tablet Take 10 mg by mouth at bedtime as needed for sleep.     No current facility-administered medications on file prior to visit.     Allergies  Allergen Reactions  . Pollen Extract Other (See Comments)  . Ether     "HEART STOPPED WHILE IN SURGERY"  . Gramineae Pollens Other (See Comments)  . Penicillins Hives  . Tamsulosin     Other reaction(s): Other (See Comments) "made me loopy"  . Band-Aid Plus Antibiotic [Bacitracin-Polymyxin B] Rash     Social History   Socioeconomic History  . Marital status: Married    Spouse name: Not on file  . Number of children: Not on file  . Years of education: Not on file  . Highest education level: Not on file  Social Needs  .  Financial resource strain: Not on file  . Food insecurity - worry: Not on file  . Food insecurity - inability: Not on file  . Transportation needs - medical: Not on file  . Transportation needs - non-medical: Not on file  Occupational History  . Not on file  Tobacco Use  . Smoking status: Former Smoker    Packs/day: 1.00    Years: 15.00    Pack years: 15.00    Last attempt to quit: 11/25/1985    Years since quitting: 31.7  . Smokeless tobacco: Never Used  Substance and Sexual Activity  . Alcohol use: No  . Drug use: No  . Sexual activity: No  Other Topics Concern  . Not on file  Social History Narrative  . Not on file    Family History  Problem Relation Age of Onset  . Heart disease Mother   . Cancer Father        colon  . Heart disease Brother   . Diabetes Maternal Aunt   . Asthma Paternal Aunt   . Bladder Cancer Maternal Aunt   . Ovarian cancer Neg Hx   . Kidney cancer Neg Hx   . Prostate cancer Neg Hx     The following portions of the patient's history were reviewed and updated as appropriate: allergies, current medications, past family history, past medical history, past social history, past surgical history and problem list.  Review of Systems Review of Systems  Constitutional: Negative.   HENT: Negative.   Eyes: Negative.   Respiratory: Negative.   Cardiovascular: Positive for leg swelling.       Leg swelling, reportedly associated with Lyrica; patient to have Dr. Rebecka Apley follow-up  Gastrointestinal: Negative.   Genitourinary: Negative.   Musculoskeletal: Negative.   Skin: Negative.   Neurological: Negative.   Endo/Heme/Allergies: Negative.   Psychiatric/Behavioral: Negative.      Objective:  BP 137/76   Pulse 77   Ht 5'  1" (1.549 m)   Wt 172 lb 6.4 oz (78.2 kg)   BMI 32.57 kg/m  CONSTITUTIONAL: Well-developed, well-nourished female in no acute distress.  PSYCHIATRIC: Normal mood and affect. Normal behavior. Normal judgment and thought content. Elizabeth: Alert and oriented to person, place, and time. Normal muscle tone coordination. No cranial nerve deficit noted. HENT:  Normocephalic, atraumatic, External right and left ear normal. Oropharynx is clear and moist EYES: Conjunctivae and EOM are normal. . No scleral icterus.  NECK: Normal range of motion, supple, no masses.  Surgically absent thyroid. SKIN: Skin is warm and dry. No rash noted. No erythema. No pallor.Slight but equal b/l ankle +2 edema  CARDIOVASCULAR: Normal heart rate noted, regular rhythm. No Homan's sign. RESPIRATORY: Clear to auscultation bilaterally. Effort and breath sounds normal, no problems with respiration noted. BREASTS: Symmetric in size. No masses, skin changes, nipple drainage, or lymphadenopathy. ABDOMEN: Soft, normal bowel sounds in all 4 quadrants, no distention noted.  No tenderness, no rebound tenderness or guarding.  BLADDER: Normal PELVIC  External Genitalia: Normal  BUS: Normal  Vagina: Atrophy of ruggae and slight paleness consistent with changes d/t age; no evidence of vaginal bleeding; no discharge  Cervix: Normal; no lesions; no cervical motion tenderness  Uterus: Midplane. No enlargement. Mobile and nontender.  Adnexa: Normal; nonpalpable nontender  RV: External Exam NormaI, No Rectal Masses and Normal Sphincter tone  MUSCULOSKELETAL: Normal range of motion. No tenderness.  No cyanosis, clubbing. (+) equal b/l ankle edema. No palpable cords. (-) Homan's sign.  2+ distal pulses. LYMPHATIC:  No Axillary, Supraclavicular, or Inguinal Adenopathy.    Assessment:   1. Annual gynecologic examination 72 y.o.  2.Contraception: post menopausal status 3. Obesity: bmi- 35 4. Menopausal atrophy, minimally symptomatic 5.   Postmenopausal bleeding, unclear etiology    Plan:  Pap: no pap needed Mammogram: April 2018 thru surgeon Stool Guaiac Testing:  Ordered Labs: thru pcp  Routine preventative health maintenance measures emphasized: Alcohol/Substance use risks, Stress Management and Peer Pressure Issues  Continue with healthy eating and exercise with controlled weight loss Pelvic ultrasound is ordered to assess postmenopausal bleeding Evista 60 mg a day is to be continued Calcium and vitamin D supplementation is to be continued Return to [place], CMA  Brayton Mars, MD  Note: This dictation was prepared with Dragon dictation along with smaller phrase technology. Any transcriptional errors that result from this process are unintentional.

## 2017-08-31 ENCOUNTER — Encounter: Payer: Self-pay | Admitting: Obstetrics and Gynecology

## 2017-08-31 ENCOUNTER — Ambulatory Visit (INDEPENDENT_AMBULATORY_CARE_PROVIDER_SITE_OTHER): Payer: Medicare Other | Admitting: Obstetrics and Gynecology

## 2017-08-31 VITALS — BP 137/76 | HR 77 | Ht 61.0 in | Wt 172.4 lb

## 2017-08-31 DIAGNOSIS — Z780 Asymptomatic menopausal state: Secondary | ICD-10-CM

## 2017-08-31 DIAGNOSIS — N950 Postmenopausal bleeding: Secondary | ICD-10-CM

## 2017-08-31 DIAGNOSIS — Z01419 Encounter for gynecological examination (general) (routine) without abnormal findings: Secondary | ICD-10-CM | POA: Diagnosis not present

## 2017-08-31 DIAGNOSIS — Z1211 Encounter for screening for malignant neoplasm of colon: Secondary | ICD-10-CM

## 2017-08-31 DIAGNOSIS — N952 Postmenopausal atrophic vaginitis: Secondary | ICD-10-CM

## 2017-08-31 NOTE — Patient Instructions (Addendum)
1.  Pap smear is not done.  No Pap is needed. 2.  Mammogram per general surgery 3.  Stool guaiac card testing is given for colon cancer screening 4.  Screening labs are to be obtained through primary care 5.  Continue with healthy eating and exercise 6.  Return in 1 year for Medicare physical 7.  Pelvic ultrasound is scheduled to assess episode of postmenopausal bleeding.  Results will be made available. 8.  Continue Evista 60 mg daily   Health Maintenance for Postmenopausal Women Menopause is a normal process in which your reproductive ability comes to an end. This process happens gradually over a span of months to years, usually between the ages of 103 and 72. Menopause is complete when you have missed 12 consecutive menstrual periods. It is important to talk with your health care provider about some of the most common conditions that affect postmenopausal women, such as heart disease, cancer, and bone loss (osteoporosis). Adopting a healthy lifestyle and getting preventive care can help to promote your health and wellness. Those actions can also lower your chances of developing some of these common conditions. What should I know about menopause? During menopause, you may experience a number of symptoms, such as:  Moderate-to-severe hot flashes.  Night sweats.  Decrease in sex drive.  Mood swings.  Headaches.  Tiredness.  Irritability.  Memory problems.  Insomnia.  Choosing to treat or not to treat menopausal changes is an individual decision that you make with your health care provider. What should I know about hormone replacement therapy and supplements? Hormone therapy products are effective for treating symptoms that are associated with menopause, such as hot flashes and night sweats. Hormone replacement carries certain risks, especially as you become older. If you are thinking about using estrogen or estrogen with progestin treatments, discuss the benefits and risks with  your health care provider. What should I know about heart disease and stroke? Heart disease, heart attack, and stroke become more likely as you age. This may be due, in part, to the hormonal changes that your body experiences during menopause. These can affect how your body processes dietary fats, triglycerides, and cholesterol. Heart attack and stroke are both medical emergencies. There are many things that you can do to help prevent heart disease and stroke:  Have your blood pressure checked at least every 1-2 years. High blood pressure causes heart disease and increases the risk of stroke.  If you are 79-59 years old, ask your health care provider if you should take aspirin to prevent a heart attack or a stroke.  Do not use any tobacco products, including cigarettes, chewing tobacco, or electronic cigarettes. If you need help quitting, ask your health care provider.  It is important to eat a healthy diet and maintain a healthy weight. ? Be sure to include plenty of vegetables, fruits, low-fat dairy products, and lean protein. ? Avoid eating foods that are high in solid fats, added sugars, or salt (sodium).  Get regular exercise. This is one of the most important things that you can do for your health. ? Try to exercise for at least 150 minutes each week. The type of exercise that you do should increase your heart rate and make you sweat. This is known as moderate-intensity exercise. ? Try to do strengthening exercises at least twice each week. Do these in addition to the moderate-intensity exercise.  Know your numbers.Ask your health care provider to check your cholesterol and your blood glucose. Continue to have  your blood tested as directed by your health care provider.  What should I know about cancer screening? There are several types of cancer. Take the following steps to reduce your risk and to catch any cancer development as early as possible. Breast Cancer  Practice breast  self-awareness. ? This means understanding how your breasts normally appear and feel. ? It also means doing regular breast self-exams. Let your health care provider know about any changes, no matter how small.  If you are 29 or older, have a clinician do a breast exam (clinical breast exam or CBE) every year. Depending on your age, family history, and medical history, it may be recommended that you also have a yearly breast X-ray (mammogram).  If you have a family history of breast cancer, talk with your health care provider about genetic screening.  If you are at high risk for breast cancer, talk with your health care provider about having an MRI and a mammogram every year.  Breast cancer (BRCA) gene test is recommended for women who have family members with BRCA-related cancers. Results of the assessment will determine the need for genetic counseling and BRCA1 and for BRCA2 testing. BRCA-related cancers include these types: ? Breast. This occurs in males or females. ? Ovarian. ? Tubal. This may also be called fallopian tube cancer. ? Cancer of the abdominal or pelvic lining (peritoneal cancer). ? Prostate. ? Pancreatic.  Cervical, Uterine, and Ovarian Cancer Your health care provider may recommend that you be screened regularly for cancer of the pelvic organs. These include your ovaries, uterus, and vagina. This screening involves a pelvic exam, which includes checking for microscopic changes to the surface of your cervix (Pap test).  For women ages 21-65, health care providers may recommend a pelvic exam and a Pap test every three years. For women ages 8-65, they may recommend the Pap test and pelvic exam, combined with testing for human papilloma virus (HPV), every five years. Some types of HPV increase your risk of cervical cancer. Testing for HPV may also be done on women of any age who have unclear Pap test results.  Other health care providers may not recommend any screening for  nonpregnant women who are considered low risk for pelvic cancer and have no symptoms. Ask your health care provider if a screening pelvic exam is right for you.  If you have had past treatment for cervical cancer or a condition that could lead to cancer, you need Pap tests and screening for cancer for at least 20 years after your treatment. If Pap tests have been discontinued for you, your risk factors (such as having a new sexual partner) need to be reassessed to determine if you should start having screenings again. Some women have medical problems that increase the chance of getting cervical cancer. In these cases, your health care provider may recommend that you have screening and Pap tests more often.  If you have a family history of uterine cancer or ovarian cancer, talk with your health care provider about genetic screening.  If you have vaginal bleeding after reaching menopause, tell your health care provider.  There are currently no reliable tests available to screen for ovarian cancer.  Lung Cancer Lung cancer screening is recommended for adults 84-63 years old who are at high risk for lung cancer because of a history of smoking. A yearly low-dose CT scan of the lungs is recommended if you:  Currently smoke.  Have a history of at least 30 pack-years of  smoking and you currently smoke or have quit within the past 15 years. A pack-year is smoking an average of one pack of cigarettes per day for one year.  Yearly screening should:  Continue until it has been 15 years since you quit.  Stop if you develop a health problem that would prevent you from having lung cancer treatment.  Colorectal Cancer  This type of cancer can be detected and can often be prevented.  Routine colorectal cancer screening usually begins at age 50 and continues through age 45.  If you have risk factors for colon cancer, your health care provider may recommend that you be screened at an earlier age.  If you  have a family history of colorectal cancer, talk with your health care provider about genetic screening.  Your health care provider may also recommend using home test kits to check for hidden blood in your stool.  A small camera at the end of a tube can be used to examine your colon directly (sigmoidoscopy or colonoscopy). This is done to check for the earliest forms of colorectal cancer.  Direct examination of the colon should be repeated every 5-10 years until age 45. However, if early forms of precancerous polyps or small growths are found or if you have a family history or genetic risk for colorectal cancer, you may need to be screened more often.  Skin Cancer  Check your skin from head to toe regularly.  Monitor any moles. Be sure to tell your health care provider: ? About any new moles or changes in moles, especially if there is a change in a mole's shape or color. ? If you have a mole that is larger than the size of a pencil eraser.  If any of your family members has a history of skin cancer, especially at a young age, talk with your health care provider about genetic screening.  Always use sunscreen. Apply sunscreen liberally and repeatedly throughout the day.  Whenever you are outside, protect yourself by wearing long sleeves, pants, a wide-brimmed hat, and sunglasses.  What should I know about osteoporosis? Osteoporosis is a condition in which bone destruction happens more quickly than new bone creation. After menopause, you may be at an increased risk for osteoporosis. To help prevent osteoporosis or the bone fractures that can happen because of osteoporosis, the following is recommended:  If you are 55-37 years old, get at least 1,000 mg of calcium and at least 600 mg of vitamin D per day.  If you are older than age 64 but younger than age 88, get at least 1,200 mg of calcium and at least 600 mg of vitamin D per day.  If you are older than age 75, get at least 1,200 mg of  calcium and at least 800 mg of vitamin D per day.  Smoking and excessive alcohol intake increase the risk of osteoporosis. Eat foods that are rich in calcium and vitamin D, and do weight-bearing exercises several times each week as directed by your health care provider. What should I know about how menopause affects my mental health? Depression may occur at any age, but it is more common as you become older. Common symptoms of depression include:  Low or sad mood.  Changes in sleep patterns.  Changes in appetite or eating patterns.  Feeling an overall lack of motivation or enjoyment of activities that you previously enjoyed.  Frequent crying spells.  Talk with your health care provider if you think that you are  experiencing depression. What should I know about immunizations? It is important that you get and maintain your immunizations. These include:  Tetanus, diphtheria, and pertussis (Tdap) booster vaccine.  Influenza every year before the flu season begins.  Pneumonia vaccine.  Shingles vaccine.  Your health care provider may also recommend other immunizations. This information is not intended to replace advice given to you by your health care provider. Make sure you discuss any questions you have with your health care provider. Document Released: 09/02/2005 Document Revised: 01/29/2016 Document Reviewed: 04/14/2015 Elsevier Interactive Patient Education  2018 [employer].

## 2017-09-01 DIAGNOSIS — J301 Allergic rhinitis due to pollen: Secondary | ICD-10-CM | POA: Diagnosis not present

## 2017-09-01 DIAGNOSIS — J3089 Other allergic rhinitis: Secondary | ICD-10-CM | POA: Diagnosis not present

## 2017-09-01 DIAGNOSIS — Z1211 Encounter for screening for malignant neoplasm of colon: Secondary | ICD-10-CM | POA: Diagnosis not present

## 2017-09-04 DIAGNOSIS — M5416 Radiculopathy, lumbar region: Secondary | ICD-10-CM | POA: Diagnosis not present

## 2017-09-04 DIAGNOSIS — M5136 Other intervertebral disc degeneration, lumbar region: Secondary | ICD-10-CM | POA: Diagnosis not present

## 2017-09-06 LAB — FECAL OCCULT BLOOD, IMMUNOCHEMICAL: FECAL OCCULT BLD: NEGATIVE

## 2017-09-07 ENCOUNTER — Other Ambulatory Visit (INDEPENDENT_AMBULATORY_CARE_PROVIDER_SITE_OTHER): Payer: Medicare Other

## 2017-09-07 ENCOUNTER — Ambulatory Visit (INDEPENDENT_AMBULATORY_CARE_PROVIDER_SITE_OTHER): Payer: Medicare Other

## 2017-09-07 ENCOUNTER — Other Ambulatory Visit: Payer: Self-pay

## 2017-09-07 ENCOUNTER — Other Ambulatory Visit: Payer: Self-pay | Admitting: Obstetrics and Gynecology

## 2017-09-07 DIAGNOSIS — R300 Dysuria: Secondary | ICD-10-CM

## 2017-09-07 DIAGNOSIS — J301 Allergic rhinitis due to pollen: Secondary | ICD-10-CM | POA: Diagnosis not present

## 2017-09-07 DIAGNOSIS — N950 Postmenopausal bleeding: Secondary | ICD-10-CM | POA: Diagnosis not present

## 2017-09-07 DIAGNOSIS — J3089 Other allergic rhinitis: Secondary | ICD-10-CM | POA: Diagnosis not present

## 2017-09-07 LAB — POCT URINALYSIS DIPSTICK
Bilirubin, UA: NEGATIVE
Blood, UA: NEGATIVE
GLUCOSE UA: NEGATIVE
Ketones, UA: NEGATIVE
NITRITE UA: NEGATIVE
ODOR: NEGATIVE
PROTEIN UA: NEGATIVE
Spec Grav, UA: 1.015 (ref 1.010–1.025)
Urobilinogen, UA: 0.2 E.U./dL
pH, UA: 6 (ref 5.0–8.0)

## 2017-09-07 MED ORDER — URIBEL 118 MG PO CAPS: 118.0000 mg | ORAL_CAPSULE | Freq: Four times a day (QID) | ORAL | 1 refills | Status: DC | PRN

## 2017-09-09 LAB — URINE CULTURE

## 2017-09-12 DIAGNOSIS — J3089 Other allergic rhinitis: Secondary | ICD-10-CM | POA: Diagnosis not present

## 2017-09-12 DIAGNOSIS — J301 Allergic rhinitis due to pollen: Secondary | ICD-10-CM | POA: Diagnosis not present

## 2017-09-19 DIAGNOSIS — J301 Allergic rhinitis due to pollen: Secondary | ICD-10-CM | POA: Diagnosis not present

## 2017-09-19 DIAGNOSIS — L299 Pruritus, unspecified: Secondary | ICD-10-CM | POA: Diagnosis not present

## 2017-09-19 DIAGNOSIS — J4540 Moderate persistent asthma, uncomplicated: Secondary | ICD-10-CM | POA: Diagnosis not present

## 2017-09-19 DIAGNOSIS — J3089 Other allergic rhinitis: Secondary | ICD-10-CM | POA: Diagnosis not present

## 2017-09-29 DIAGNOSIS — J301 Allergic rhinitis due to pollen: Secondary | ICD-10-CM | POA: Diagnosis not present

## 2017-09-29 DIAGNOSIS — J3089 Other allergic rhinitis: Secondary | ICD-10-CM | POA: Diagnosis not present

## 2017-10-03 DIAGNOSIS — J301 Allergic rhinitis due to pollen: Secondary | ICD-10-CM | POA: Diagnosis not present

## 2017-10-03 DIAGNOSIS — J3089 Other allergic rhinitis: Secondary | ICD-10-CM | POA: Diagnosis not present

## 2017-10-10 DIAGNOSIS — M5416 Radiculopathy, lumbar region: Secondary | ICD-10-CM | POA: Diagnosis not present

## 2017-10-10 DIAGNOSIS — M5136 Other intervertebral disc degeneration, lumbar region: Secondary | ICD-10-CM | POA: Diagnosis not present

## 2017-10-17 DIAGNOSIS — J301 Allergic rhinitis due to pollen: Secondary | ICD-10-CM | POA: Diagnosis not present

## 2017-10-17 DIAGNOSIS — J3089 Other allergic rhinitis: Secondary | ICD-10-CM | POA: Diagnosis not present

## 2017-10-20 ENCOUNTER — Other Ambulatory Visit: Payer: Self-pay | Admitting: Family Medicine

## 2017-10-20 DIAGNOSIS — M5414 Radiculopathy, thoracic region: Secondary | ICD-10-CM | POA: Diagnosis not present

## 2017-10-20 DIAGNOSIS — M5416 Radiculopathy, lumbar region: Secondary | ICD-10-CM

## 2017-10-20 DIAGNOSIS — M5136 Other intervertebral disc degeneration, lumbar region: Secondary | ICD-10-CM | POA: Diagnosis not present

## 2017-10-24 DIAGNOSIS — J301 Allergic rhinitis due to pollen: Secondary | ICD-10-CM | POA: Diagnosis not present

## 2017-10-24 DIAGNOSIS — J3089 Other allergic rhinitis: Secondary | ICD-10-CM | POA: Diagnosis not present

## 2017-10-30 DIAGNOSIS — Z6832 Body mass index (BMI) 32.0-32.9, adult: Secondary | ICD-10-CM | POA: Diagnosis not present

## 2017-10-30 DIAGNOSIS — D0501 Lobular carcinoma in situ of right breast: Secondary | ICD-10-CM | POA: Diagnosis not present

## 2017-10-30 DIAGNOSIS — Z1231 Encounter for screening mammogram for malignant neoplasm of breast: Secondary | ICD-10-CM | POA: Diagnosis not present

## 2017-10-31 ENCOUNTER — Ambulatory Visit: Payer: Medicare Other

## 2017-10-31 ENCOUNTER — Ambulatory Visit
Admission: RE | Admit: 2017-10-31 | Discharge: 2017-10-31 | Disposition: A | Discharge status: Home / Self Care | Payer: Medicare Other | Source: Ambulatory Visit | Attending: Family Medicine | Admitting: Family Medicine

## 2017-10-31 DIAGNOSIS — M5414 Radiculopathy, thoracic region: Secondary | ICD-10-CM | POA: Insufficient documentation

## 2017-10-31 DIAGNOSIS — J3089 Other allergic rhinitis: Secondary | ICD-10-CM | POA: Diagnosis not present

## 2017-10-31 DIAGNOSIS — J301 Allergic rhinitis due to pollen: Secondary | ICD-10-CM | POA: Diagnosis not present

## 2017-10-31 DIAGNOSIS — M5126 Other intervertebral disc displacement, lumbar region: Secondary | ICD-10-CM | POA: Diagnosis not present

## 2017-10-31 DIAGNOSIS — M5416 Radiculopathy, lumbar region: Secondary | ICD-10-CM | POA: Insufficient documentation

## 2017-10-31 DIAGNOSIS — M549 Dorsalgia, unspecified: Secondary | ICD-10-CM | POA: Diagnosis present

## 2017-10-31 DIAGNOSIS — M479 Spondylosis, unspecified: Secondary | ICD-10-CM | POA: Insufficient documentation

## 2017-10-31 DIAGNOSIS — M5124 Other intervertebral disc displacement, thoracic region: Secondary | ICD-10-CM | POA: Diagnosis not present

## 2017-11-07 DIAGNOSIS — J301 Allergic rhinitis due to pollen: Secondary | ICD-10-CM | POA: Diagnosis not present

## 2017-11-07 DIAGNOSIS — J3089 Other allergic rhinitis: Secondary | ICD-10-CM | POA: Diagnosis not present

## 2017-11-14 DIAGNOSIS — N184 Chronic kidney disease, stage 4 (severe): Secondary | ICD-10-CM | POA: Diagnosis not present

## 2017-11-14 DIAGNOSIS — E669 Obesity, unspecified: Secondary | ICD-10-CM | POA: Diagnosis not present

## 2017-11-14 DIAGNOSIS — E038 Other specified hypothyroidism: Secondary | ICD-10-CM | POA: Diagnosis not present

## 2017-11-14 DIAGNOSIS — J449 Chronic obstructive pulmonary disease, unspecified: Secondary | ICD-10-CM | POA: Diagnosis not present

## 2017-11-23 DIAGNOSIS — J3089 Other allergic rhinitis: Secondary | ICD-10-CM | POA: Diagnosis not present

## 2017-11-23 DIAGNOSIS — J301 Allergic rhinitis due to pollen: Secondary | ICD-10-CM | POA: Diagnosis not present

## 2017-11-28 DIAGNOSIS — J301 Allergic rhinitis due to pollen: Secondary | ICD-10-CM | POA: Diagnosis not present

## 2017-11-28 DIAGNOSIS — J3089 Other allergic rhinitis: Secondary | ICD-10-CM | POA: Diagnosis not present

## 2017-11-30 DIAGNOSIS — J3089 Other allergic rhinitis: Secondary | ICD-10-CM | POA: Diagnosis not present

## 2017-11-30 DIAGNOSIS — J301 Allergic rhinitis due to pollen: Secondary | ICD-10-CM | POA: Diagnosis not present

## 2017-12-08 DIAGNOSIS — J301 Allergic rhinitis due to pollen: Secondary | ICD-10-CM | POA: Diagnosis not present

## 2017-12-08 DIAGNOSIS — J3089 Other allergic rhinitis: Secondary | ICD-10-CM | POA: Diagnosis not present

## 2017-12-15 DIAGNOSIS — M5136 Other intervertebral disc degeneration, lumbar region: Secondary | ICD-10-CM | POA: Diagnosis not present

## 2017-12-15 DIAGNOSIS — M5414 Radiculopathy, thoracic region: Secondary | ICD-10-CM | POA: Diagnosis not present

## 2017-12-15 DIAGNOSIS — J301 Allergic rhinitis due to pollen: Secondary | ICD-10-CM | POA: Diagnosis not present

## 2017-12-15 DIAGNOSIS — M5416 Radiculopathy, lumbar region: Secondary | ICD-10-CM | POA: Diagnosis not present

## 2017-12-15 DIAGNOSIS — J3089 Other allergic rhinitis: Secondary | ICD-10-CM | POA: Diagnosis not present

## 2017-12-19 DIAGNOSIS — M5136 Other intervertebral disc degeneration, lumbar region: Secondary | ICD-10-CM | POA: Diagnosis not present

## 2017-12-19 DIAGNOSIS — M5416 Radiculopathy, lumbar region: Secondary | ICD-10-CM | POA: Diagnosis not present

## 2017-12-21 DIAGNOSIS — J3089 Other allergic rhinitis: Secondary | ICD-10-CM | POA: Diagnosis not present

## 2017-12-21 DIAGNOSIS — J301 Allergic rhinitis due to pollen: Secondary | ICD-10-CM | POA: Diagnosis not present

## 2017-12-26 DIAGNOSIS — M4726 Other spondylosis with radiculopathy, lumbar region: Secondary | ICD-10-CM | POA: Diagnosis not present

## 2017-12-26 DIAGNOSIS — M47814 Spondylosis without myelopathy or radiculopathy, thoracic region: Secondary | ICD-10-CM | POA: Diagnosis not present

## 2017-12-26 DIAGNOSIS — G8929 Other chronic pain: Secondary | ICD-10-CM | POA: Diagnosis not present

## 2017-12-26 DIAGNOSIS — M47816 Spondylosis without myelopathy or radiculopathy, lumbar region: Secondary | ICD-10-CM | POA: Diagnosis not present

## 2017-12-26 DIAGNOSIS — J301 Allergic rhinitis due to pollen: Secondary | ICD-10-CM | POA: Diagnosis not present

## 2017-12-26 DIAGNOSIS — M545 Low back pain: Secondary | ICD-10-CM | POA: Diagnosis not present

## 2017-12-26 DIAGNOSIS — J3089 Other allergic rhinitis: Secondary | ICD-10-CM | POA: Diagnosis not present

## 2017-12-26 DIAGNOSIS — M4724 Other spondylosis with radiculopathy, thoracic region: Secondary | ICD-10-CM | POA: Diagnosis not present

## 2018-01-02 DIAGNOSIS — J3089 Other allergic rhinitis: Secondary | ICD-10-CM | POA: Diagnosis not present

## 2018-01-02 DIAGNOSIS — J301 Allergic rhinitis due to pollen: Secondary | ICD-10-CM | POA: Diagnosis not present

## 2018-01-05 DIAGNOSIS — J3089 Other allergic rhinitis: Secondary | ICD-10-CM | POA: Diagnosis not present

## 2018-01-05 DIAGNOSIS — J301 Allergic rhinitis due to pollen: Secondary | ICD-10-CM | POA: Diagnosis not present

## 2018-01-09 DIAGNOSIS — J329 Chronic sinusitis, unspecified: Secondary | ICD-10-CM | POA: Diagnosis not present

## 2018-01-09 DIAGNOSIS — M797 Fibromyalgia: Secondary | ICD-10-CM | POA: Diagnosis not present

## 2018-01-09 DIAGNOSIS — K219 Gastro-esophageal reflux disease without esophagitis: Secondary | ICD-10-CM | POA: Diagnosis not present

## 2018-01-09 DIAGNOSIS — J3089 Other allergic rhinitis: Secondary | ICD-10-CM | POA: Diagnosis not present

## 2018-01-09 DIAGNOSIS — I10 Essential (primary) hypertension: Secondary | ICD-10-CM | POA: Diagnosis not present

## 2018-01-09 DIAGNOSIS — J301 Allergic rhinitis due to pollen: Secondary | ICD-10-CM | POA: Diagnosis not present

## 2018-01-11 DIAGNOSIS — N183 Chronic kidney disease, stage 3 (moderate): Secondary | ICD-10-CM | POA: Diagnosis not present

## 2018-01-11 DIAGNOSIS — I10 Essential (primary) hypertension: Secondary | ICD-10-CM | POA: Diagnosis not present

## 2018-01-11 DIAGNOSIS — E034 Atrophy of thyroid (acquired): Secondary | ICD-10-CM | POA: Diagnosis not present

## 2018-01-12 DIAGNOSIS — J301 Allergic rhinitis due to pollen: Secondary | ICD-10-CM | POA: Diagnosis not present

## 2018-01-12 DIAGNOSIS — J3089 Other allergic rhinitis: Secondary | ICD-10-CM | POA: Diagnosis not present

## 2018-01-16 DIAGNOSIS — J3089 Other allergic rhinitis: Secondary | ICD-10-CM | POA: Diagnosis not present

## 2018-01-16 DIAGNOSIS — J301 Allergic rhinitis due to pollen: Secondary | ICD-10-CM | POA: Diagnosis not present

## 2018-01-30 DIAGNOSIS — J301 Allergic rhinitis due to pollen: Secondary | ICD-10-CM | POA: Diagnosis not present

## 2018-01-30 DIAGNOSIS — J3089 Other allergic rhinitis: Secondary | ICD-10-CM | POA: Diagnosis not present

## 2018-02-02 DIAGNOSIS — J301 Allergic rhinitis due to pollen: Secondary | ICD-10-CM | POA: Diagnosis not present

## 2018-02-02 DIAGNOSIS — J3089 Other allergic rhinitis: Secondary | ICD-10-CM | POA: Diagnosis not present

## 2018-02-06 DIAGNOSIS — J301 Allergic rhinitis due to pollen: Secondary | ICD-10-CM | POA: Diagnosis not present

## 2018-02-06 DIAGNOSIS — J3089 Other allergic rhinitis: Secondary | ICD-10-CM | POA: Diagnosis not present

## 2018-02-08 DIAGNOSIS — M722 Plantar fascial fibromatosis: Secondary | ICD-10-CM | POA: Diagnosis not present

## 2018-02-16 DIAGNOSIS — J301 Allergic rhinitis due to pollen: Secondary | ICD-10-CM | POA: Diagnosis not present

## 2018-02-16 DIAGNOSIS — J3089 Other allergic rhinitis: Secondary | ICD-10-CM | POA: Diagnosis not present

## 2018-02-19 DIAGNOSIS — K219 Gastro-esophageal reflux disease without esophagitis: Secondary | ICD-10-CM | POA: Diagnosis not present

## 2018-02-19 DIAGNOSIS — N3020 Other chronic cystitis without hematuria: Secondary | ICD-10-CM | POA: Diagnosis not present

## 2018-02-19 DIAGNOSIS — I10 Essential (primary) hypertension: Secondary | ICD-10-CM | POA: Diagnosis not present

## 2018-02-19 DIAGNOSIS — M797 Fibromyalgia: Secondary | ICD-10-CM | POA: Diagnosis not present

## 2018-02-20 DIAGNOSIS — R5381 Other malaise: Secondary | ICD-10-CM | POA: Diagnosis not present

## 2018-02-23 DIAGNOSIS — J3089 Other allergic rhinitis: Secondary | ICD-10-CM | POA: Diagnosis not present

## 2018-02-23 DIAGNOSIS — J301 Allergic rhinitis due to pollen: Secondary | ICD-10-CM | POA: Diagnosis not present

## 2018-02-26 DIAGNOSIS — M722 Plantar fascial fibromatosis: Secondary | ICD-10-CM | POA: Diagnosis not present

## 2018-02-26 DIAGNOSIS — M79672 Pain in left foot: Secondary | ICD-10-CM | POA: Diagnosis not present

## 2018-02-27 DIAGNOSIS — K4020 Bilateral inguinal hernia, without obstruction or gangrene, not specified as recurrent: Secondary | ICD-10-CM | POA: Diagnosis not present

## 2018-02-27 DIAGNOSIS — M797 Fibromyalgia: Secondary | ICD-10-CM | POA: Diagnosis not present

## 2018-02-27 DIAGNOSIS — J3089 Other allergic rhinitis: Secondary | ICD-10-CM | POA: Diagnosis not present

## 2018-02-27 DIAGNOSIS — I10 Essential (primary) hypertension: Secondary | ICD-10-CM | POA: Diagnosis not present

## 2018-02-27 DIAGNOSIS — K219 Gastro-esophageal reflux disease without esophagitis: Secondary | ICD-10-CM | POA: Diagnosis not present

## 2018-02-27 DIAGNOSIS — J301 Allergic rhinitis due to pollen: Secondary | ICD-10-CM | POA: Diagnosis not present

## 2018-03-09 DIAGNOSIS — J3089 Other allergic rhinitis: Secondary | ICD-10-CM | POA: Diagnosis not present

## 2018-03-09 DIAGNOSIS — J301 Allergic rhinitis due to pollen: Secondary | ICD-10-CM | POA: Diagnosis not present

## 2018-03-13 DIAGNOSIS — M722 Plantar fascial fibromatosis: Secondary | ICD-10-CM | POA: Diagnosis not present

## 2018-03-13 DIAGNOSIS — M79672 Pain in left foot: Secondary | ICD-10-CM | POA: Diagnosis not present

## 2018-03-13 DIAGNOSIS — J3089 Other allergic rhinitis: Secondary | ICD-10-CM | POA: Diagnosis not present

## 2018-03-13 DIAGNOSIS — J301 Allergic rhinitis due to pollen: Secondary | ICD-10-CM | POA: Diagnosis not present

## 2018-03-14 DIAGNOSIS — M5414 Radiculopathy, thoracic region: Secondary | ICD-10-CM | POA: Diagnosis not present

## 2018-03-14 DIAGNOSIS — M5416 Radiculopathy, lumbar region: Secondary | ICD-10-CM | POA: Diagnosis not present

## 2018-03-14 DIAGNOSIS — M5136 Other intervertebral disc degeneration, lumbar region: Secondary | ICD-10-CM | POA: Diagnosis not present

## 2018-03-23 DIAGNOSIS — J301 Allergic rhinitis due to pollen: Secondary | ICD-10-CM | POA: Diagnosis not present

## 2018-03-23 DIAGNOSIS — J3089 Other allergic rhinitis: Secondary | ICD-10-CM | POA: Diagnosis not present

## 2018-03-29 DIAGNOSIS — J301 Allergic rhinitis due to pollen: Secondary | ICD-10-CM | POA: Diagnosis not present

## 2018-03-29 DIAGNOSIS — J3089 Other allergic rhinitis: Secondary | ICD-10-CM | POA: Diagnosis not present

## 2018-04-03 DIAGNOSIS — M353 Polymyalgia rheumatica: Secondary | ICD-10-CM | POA: Diagnosis not present

## 2018-04-03 DIAGNOSIS — E038 Other specified hypothyroidism: Secondary | ICD-10-CM | POA: Diagnosis not present

## 2018-04-03 DIAGNOSIS — N3010 Interstitial cystitis (chronic) without hematuria: Secondary | ICD-10-CM | POA: Diagnosis not present

## 2018-04-03 DIAGNOSIS — J301 Allergic rhinitis due to pollen: Secondary | ICD-10-CM | POA: Diagnosis not present

## 2018-04-03 DIAGNOSIS — J3089 Other allergic rhinitis: Secondary | ICD-10-CM | POA: Diagnosis not present

## 2018-04-03 DIAGNOSIS — E669 Obesity, unspecified: Secondary | ICD-10-CM | POA: Diagnosis not present

## 2018-04-13 DIAGNOSIS — J301 Allergic rhinitis due to pollen: Secondary | ICD-10-CM | POA: Diagnosis not present

## 2018-04-13 DIAGNOSIS — J3089 Other allergic rhinitis: Secondary | ICD-10-CM | POA: Diagnosis not present

## 2018-04-17 DIAGNOSIS — J301 Allergic rhinitis due to pollen: Secondary | ICD-10-CM | POA: Diagnosis not present

## 2018-04-17 DIAGNOSIS — J3089 Other allergic rhinitis: Secondary | ICD-10-CM | POA: Diagnosis not present

## 2018-04-19 DIAGNOSIS — J3089 Other allergic rhinitis: Secondary | ICD-10-CM | POA: Diagnosis not present

## 2018-04-19 DIAGNOSIS — J301 Allergic rhinitis due to pollen: Secondary | ICD-10-CM | POA: Diagnosis not present

## 2018-04-26 DIAGNOSIS — J301 Allergic rhinitis due to pollen: Secondary | ICD-10-CM | POA: Diagnosis not present

## 2018-04-26 DIAGNOSIS — J3089 Other allergic rhinitis: Secondary | ICD-10-CM | POA: Diagnosis not present

## 2018-04-30 DIAGNOSIS — Z6833 Body mass index (BMI) 33.0-33.9, adult: Secondary | ICD-10-CM | POA: Diagnosis not present

## 2018-04-30 DIAGNOSIS — D0500 Lobular carcinoma in situ of unspecified breast: Secondary | ICD-10-CM | POA: Diagnosis not present

## 2018-04-30 DIAGNOSIS — Z1231 Encounter for screening mammogram for malignant neoplasm of breast: Secondary | ICD-10-CM | POA: Diagnosis not present

## 2018-05-01 DIAGNOSIS — E038 Other specified hypothyroidism: Secondary | ICD-10-CM | POA: Diagnosis not present

## 2018-05-01 DIAGNOSIS — M353 Polymyalgia rheumatica: Secondary | ICD-10-CM | POA: Diagnosis not present

## 2018-05-01 DIAGNOSIS — E669 Obesity, unspecified: Secondary | ICD-10-CM | POA: Diagnosis not present

## 2018-05-01 DIAGNOSIS — J3089 Other allergic rhinitis: Secondary | ICD-10-CM | POA: Diagnosis not present

## 2018-05-01 DIAGNOSIS — N3010 Interstitial cystitis (chronic) without hematuria: Secondary | ICD-10-CM | POA: Diagnosis not present

## 2018-05-01 DIAGNOSIS — Z23 Encounter for immunization: Secondary | ICD-10-CM | POA: Diagnosis not present

## 2018-05-01 DIAGNOSIS — Z0000 Encounter for general adult medical examination without abnormal findings: Secondary | ICD-10-CM | POA: Diagnosis not present

## 2018-05-01 DIAGNOSIS — J301 Allergic rhinitis due to pollen: Secondary | ICD-10-CM | POA: Diagnosis not present

## 2018-05-08 DIAGNOSIS — J3089 Other allergic rhinitis: Secondary | ICD-10-CM | POA: Diagnosis not present

## 2018-05-08 DIAGNOSIS — J301 Allergic rhinitis due to pollen: Secondary | ICD-10-CM | POA: Diagnosis not present

## 2018-05-24 DIAGNOSIS — J3089 Other allergic rhinitis: Secondary | ICD-10-CM | POA: Diagnosis not present

## 2018-05-24 DIAGNOSIS — J301 Allergic rhinitis due to pollen: Secondary | ICD-10-CM | POA: Diagnosis not present

## 2018-05-29 DIAGNOSIS — J301 Allergic rhinitis due to pollen: Secondary | ICD-10-CM | POA: Diagnosis not present

## 2018-05-29 DIAGNOSIS — J3089 Other allergic rhinitis: Secondary | ICD-10-CM | POA: Diagnosis not present

## 2018-06-05 DIAGNOSIS — J3089 Other allergic rhinitis: Secondary | ICD-10-CM | POA: Diagnosis not present

## 2018-06-05 DIAGNOSIS — J301 Allergic rhinitis due to pollen: Secondary | ICD-10-CM | POA: Diagnosis not present

## 2018-06-06 DIAGNOSIS — M5414 Radiculopathy, thoracic region: Secondary | ICD-10-CM | POA: Diagnosis not present

## 2018-06-06 DIAGNOSIS — M5136 Other intervertebral disc degeneration, lumbar region: Secondary | ICD-10-CM | POA: Diagnosis not present

## 2018-06-06 DIAGNOSIS — M5416 Radiculopathy, lumbar region: Secondary | ICD-10-CM | POA: Diagnosis not present

## 2018-06-08 DIAGNOSIS — J301 Allergic rhinitis due to pollen: Secondary | ICD-10-CM | POA: Diagnosis not present

## 2018-06-08 DIAGNOSIS — J3089 Other allergic rhinitis: Secondary | ICD-10-CM | POA: Diagnosis not present

## 2018-06-12 DIAGNOSIS — J3089 Other allergic rhinitis: Secondary | ICD-10-CM | POA: Diagnosis not present

## 2018-06-12 DIAGNOSIS — J301 Allergic rhinitis due to pollen: Secondary | ICD-10-CM | POA: Diagnosis not present

## 2018-06-15 DIAGNOSIS — J301 Allergic rhinitis due to pollen: Secondary | ICD-10-CM | POA: Diagnosis not present

## 2018-06-15 DIAGNOSIS — J3089 Other allergic rhinitis: Secondary | ICD-10-CM | POA: Diagnosis not present

## 2018-06-19 DIAGNOSIS — J301 Allergic rhinitis due to pollen: Secondary | ICD-10-CM | POA: Diagnosis not present

## 2018-06-19 DIAGNOSIS — J3089 Other allergic rhinitis: Secondary | ICD-10-CM | POA: Diagnosis not present

## 2018-06-26 DIAGNOSIS — J301 Allergic rhinitis due to pollen: Secondary | ICD-10-CM | POA: Diagnosis not present

## 2018-06-26 DIAGNOSIS — J3089 Other allergic rhinitis: Secondary | ICD-10-CM | POA: Diagnosis not present

## 2018-07-03 DIAGNOSIS — J449 Chronic obstructive pulmonary disease, unspecified: Secondary | ICD-10-CM | POA: Diagnosis not present

## 2018-07-03 DIAGNOSIS — I10 Essential (primary) hypertension: Secondary | ICD-10-CM | POA: Diagnosis not present

## 2018-07-03 DIAGNOSIS — N184 Chronic kidney disease, stage 4 (severe): Secondary | ICD-10-CM | POA: Diagnosis not present

## 2018-07-03 DIAGNOSIS — J329 Chronic sinusitis, unspecified: Secondary | ICD-10-CM | POA: Diagnosis not present

## 2018-07-05 DIAGNOSIS — I129 Hypertensive chronic kidney disease with stage 1 through stage 4 chronic kidney disease, or unspecified chronic kidney disease: Secondary | ICD-10-CM | POA: Diagnosis not present

## 2018-07-05 DIAGNOSIS — N390 Urinary tract infection, site not specified: Secondary | ICD-10-CM | POA: Diagnosis not present

## 2018-07-05 DIAGNOSIS — R600 Localized edema: Secondary | ICD-10-CM | POA: Diagnosis not present

## 2018-07-05 DIAGNOSIS — J301 Allergic rhinitis due to pollen: Secondary | ICD-10-CM | POA: Diagnosis not present

## 2018-07-05 DIAGNOSIS — J3089 Other allergic rhinitis: Secondary | ICD-10-CM | POA: Diagnosis not present

## 2018-07-05 DIAGNOSIS — N183 Chronic kidney disease, stage 3 (moderate): Secondary | ICD-10-CM | POA: Diagnosis not present

## 2018-07-10 DIAGNOSIS — J3089 Other allergic rhinitis: Secondary | ICD-10-CM | POA: Diagnosis not present

## 2018-07-10 DIAGNOSIS — J301 Allergic rhinitis due to pollen: Secondary | ICD-10-CM | POA: Diagnosis not present

## 2018-07-20 DIAGNOSIS — J3089 Other allergic rhinitis: Secondary | ICD-10-CM | POA: Diagnosis not present

## 2018-07-20 DIAGNOSIS — J301 Allergic rhinitis due to pollen: Secondary | ICD-10-CM | POA: Diagnosis not present

## 2018-07-24 DIAGNOSIS — J301 Allergic rhinitis due to pollen: Secondary | ICD-10-CM | POA: Diagnosis not present

## 2018-07-24 DIAGNOSIS — J3089 Other allergic rhinitis: Secondary | ICD-10-CM | POA: Diagnosis not present

## 2018-07-31 DIAGNOSIS — N184 Chronic kidney disease, stage 4 (severe): Secondary | ICD-10-CM | POA: Diagnosis not present

## 2018-07-31 DIAGNOSIS — J449 Chronic obstructive pulmonary disease, unspecified: Secondary | ICD-10-CM | POA: Diagnosis not present

## 2018-07-31 DIAGNOSIS — N3010 Interstitial cystitis (chronic) without hematuria: Secondary | ICD-10-CM | POA: Diagnosis not present

## 2018-07-31 DIAGNOSIS — M797 Fibromyalgia: Secondary | ICD-10-CM | POA: Diagnosis not present

## 2018-07-31 DIAGNOSIS — J301 Allergic rhinitis due to pollen: Secondary | ICD-10-CM | POA: Diagnosis not present

## 2018-07-31 DIAGNOSIS — J3089 Other allergic rhinitis: Secondary | ICD-10-CM | POA: Diagnosis not present

## 2018-08-07 DIAGNOSIS — J3089 Other allergic rhinitis: Secondary | ICD-10-CM | POA: Diagnosis not present

## 2018-08-07 DIAGNOSIS — L299 Pruritus, unspecified: Secondary | ICD-10-CM | POA: Diagnosis not present

## 2018-08-07 DIAGNOSIS — J301 Allergic rhinitis due to pollen: Secondary | ICD-10-CM | POA: Diagnosis not present

## 2018-08-07 DIAGNOSIS — J4540 Moderate persistent asthma, uncomplicated: Secondary | ICD-10-CM | POA: Diagnosis not present

## 2018-08-16 DIAGNOSIS — J3089 Other allergic rhinitis: Secondary | ICD-10-CM | POA: Diagnosis not present

## 2018-08-16 DIAGNOSIS — J301 Allergic rhinitis due to pollen: Secondary | ICD-10-CM | POA: Diagnosis not present

## 2018-08-21 DIAGNOSIS — J3089 Other allergic rhinitis: Secondary | ICD-10-CM | POA: Diagnosis not present

## 2018-08-21 DIAGNOSIS — J301 Allergic rhinitis due to pollen: Secondary | ICD-10-CM | POA: Diagnosis not present

## 2018-08-28 DIAGNOSIS — N184 Chronic kidney disease, stage 4 (severe): Secondary | ICD-10-CM | POA: Diagnosis not present

## 2018-08-28 DIAGNOSIS — J329 Chronic sinusitis, unspecified: Secondary | ICD-10-CM | POA: Diagnosis not present

## 2018-08-28 DIAGNOSIS — J449 Chronic obstructive pulmonary disease, unspecified: Secondary | ICD-10-CM | POA: Diagnosis not present

## 2018-08-28 DIAGNOSIS — J301 Allergic rhinitis due to pollen: Secondary | ICD-10-CM | POA: Diagnosis not present

## 2018-08-28 DIAGNOSIS — N200 Calculus of kidney: Secondary | ICD-10-CM | POA: Diagnosis not present

## 2018-08-28 DIAGNOSIS — J3089 Other allergic rhinitis: Secondary | ICD-10-CM | POA: Diagnosis not present

## 2018-09-06 ENCOUNTER — Encounter: Payer: Medicare Other | Admitting: Obstetrics and Gynecology

## 2018-09-06 ENCOUNTER — Ambulatory Visit (INDEPENDENT_AMBULATORY_CARE_PROVIDER_SITE_OTHER): Payer: Medicare Other | Admitting: Obstetrics and Gynecology

## 2018-09-06 ENCOUNTER — Encounter: Payer: Self-pay | Admitting: Obstetrics and Gynecology

## 2018-09-06 VITALS — BP 155/97 | HR 85 | Ht 63.0 in | Wt 176.8 lb

## 2018-09-06 DIAGNOSIS — J301 Allergic rhinitis due to pollen: Secondary | ICD-10-CM | POA: Diagnosis not present

## 2018-09-06 DIAGNOSIS — Z01419 Encounter for gynecological examination (general) (routine) without abnormal findings: Secondary | ICD-10-CM

## 2018-09-06 DIAGNOSIS — Z85850 Personal history of malignant neoplasm of thyroid: Secondary | ICD-10-CM

## 2018-09-06 DIAGNOSIS — J3089 Other allergic rhinitis: Secondary | ICD-10-CM | POA: Diagnosis not present

## 2018-09-06 NOTE — Progress Notes (Signed)
Subjective:   [name] is a 73 y.o. G30P2002 Caucasian female here for a routine well-woman exam.  No LMP recorded. Patient is postmenopausal.    Current complaints: none, sad due to recent loss of neice PCP: Masoud       PCP does labs  Social History: Sexual: heterosexual Marital Status: married Living situation: with spouse Occupation: retired Tobacco/alcohol: no tobacco use Illicit drugs: no history of illicit drug use  The following portions of the patient's history were reviewed and updated as appropriate: allergies, current medications, past family history, past medical history, past social history, past surgical history and problem list.  Past Medical History Past Medical History:  Diagnosis Date  . Asthma    WELL CONTROLLED  . Bulging lumbar disc   . Cancer [facility]) 1999   thyroid with recurrent 2006  . Complication of anesthesia    HEART STOPPED DURING TONSILLECTOMY (AGE 18) DUE TO ALLERGY TO ETHER  . DDD (degenerative disc disease), lumbar   . DDD (degenerative disc disease), thoracolumbar   . Diverticulosis   . Family history of adverse reaction to anesthesia    PTS FATHER-UNSURE WHAT HAPPENED  . GERD (gastroesophageal reflux disease)   . Heart murmur   . Hepatitis    AGE 2 "VIRAL"  . Hypertension    OFF MEDS CURRENTLY PER PCP (MASOUD)  . Hypothyroidism   . Interstitial cystitis   . Kidney disease   . PONV (postoperative nausea and vomiting)   . Thyroid disease     Past Surgical History Past Surgical History:  Procedure Laterality Date  . BLADDER SURGERY  1984  . BREAST SURGERY  1224,8250   mass removed  . BREAST SURGERY  April 2017   Dr Jack Quarto [facility]  . CATARACT EXTRACTION  2011  . CHOLECYSTECTOMY N/A 11/30/2015   Procedure: LAPAROSCOPIC CHOLECYSTECTOMY WITH INTRAOPERATIVE CHOLANGIOGRAM;  Surgeon: Christene Lye, MD;  Location: [facility] ORS;  Service: General;  Laterality: N/A;  . COLONOSCOPY  2005, 2015  . DILATION AND CURETTAGE OF UTERUS   1975  . EXCISIONAL HEMORRHOIDECTOMY  1975  . EYE SURGERY Left 2011  . [place]  . HERNIA REPAIR  2015  . THYROIDECTOMY  1999  . TONSILLECTOMY  1952   AGE 18    Gynecologic History G2P2002  No LMP recorded. Patient is postmenopausal. Contraception: post menopausal status Last Pap: 2018. Results were: normal Last mammogram: 3 months ago. Results were: normal   Obstetric History OB History  Gravida Para Term Preterm AB Living  2 2 2     2   SAB TAB Ectopic Multiple Live Births          2    # Outcome Date GA Lbr Len/2nd Weight Sex Delivery Anes PTL Lv  2 Term     M Vag-Spont   LIV  1 Term     F Vag-Spont   LIV    Obstetric Comments  1st Menstrual Cycle:  10  1st Pregnancy:  21    Current Medications Current Outpatient Medications on File Prior to Visit  Medication Sig Dispense Refill  . albuterol (PROVENTIL) (2.5 MG/3ML) 0.083% nebulizer solution Inhale into the lungs.    . ALBUTEROL IN Inhale into the lungs as needed.    Marland Kitchen aspirin 81 MG tablet Take 81 mg by mouth daily.    . Calcium 500 MG CHEW Chew by mouth.    . cetirizine (ZYRTEC) 10 MG tablet Take 10 mg by mouth daily.    Marland Kitchen  Cholecalciferol (VITAMIN D-3) 1000 units CAPS Take by mouth daily.    Marland Kitchen FLUoxetine (PROZAC) 40 MG capsule Take 40 mg by mouth every morning.    . fluticasone (FLONASE) 50 MCG/ACT nasal spray Place 1 spray into both nostrils daily.    . Fluticasone-Salmeterol (ADVAIR) 250-50 MCG/DOSE AEPB Inhale 1 puff into the lungs 3 (three) times daily as needed.     . furosemide (LASIX) 20 MG tablet Take 40 mg by mouth daily.     . Glucosamine-Chondroitin 500-400 MG CAPS Take by mouth.    Marland Kitchen HYDROcodone-acetaminophen (NORCO) 7.5-325 MG tablet Take by mouth.    . levothyroxine (SYNTHROID, LEVOTHROID) 150 MCG tablet Take 125 mcg by mouth daily before breakfast.     . olmesartan (BENICAR) 20 MG tablet Take 20 mg by mouth as needed (IF BP IS ELEVATED).    . Omega-3 Fatty Acids (FISH OIL) 1000 MG  CAPS Take by mouth.    . pantoprazole (PROTONIX) 40 MG tablet Take 1 tablet by mouth every morning.     . potassium chloride SA (K-DUR,KLOR-CON) 20 MEQ tablet TK 1 T PO QD  2  . PRESCRIPTION MEDICATION Allergy injections once weekly    . raloxifene (EVISTA) 60 MG tablet   10  . tiZANidine (ZANAFLEX) 4 MG capsule Take 4 mg by mouth as needed.     . vitamin C (ASCORBIC ACID) 500 MG tablet Take by mouth.    . zolpidem (AMBIEN) 10 MG tablet Take 10 mg by mouth at bedtime as needed for sleep.    . clonazePAM (KLONOPIN) 0.5 MG tablet Take 0.5 mg by mouth 2 (two) times daily as needed for anxiety.    . gabapentin (NEURONTIN) 100 MG capsule TAKE 2 CAPSULES BY MOUTH TWICE DAILY    . Meth-Hyo-M Bl-Na Phos-Ph Sal (URIBEL) 118 MG CAPS Take 1 capsule (118 mg total) by mouth 4 (four) times daily as needed. (Patient not taking: Reported on 09/06/2018) 30 capsule 1  . pregabalin (LYRICA) 50 MG capsule Take by mouth.     No current facility-administered medications on file prior to visit.     Review of Systems Patient denies any headaches, blurred vision, shortness of breath, chest pain, abdominal pain, problems with bowel movements, urination, or intercourse.  Objective:  BP (!) 155/97   Pulse 85   Ht 5\' 3"  (1.6 m)   Wt 176 lb 12.8 oz (80.2 kg)   BMI 31.32 kg/m  Physical Exam  General:  Well developed, well nourished, no acute distress. She is alert and oriented x3. Skin:  Warm and dry Neck:  Midline trachea, no thyromegaly or nodules Cardiovascular: Regular rate and rhythm, no murmur heard Lungs:  Effort normal, all lung fields clear to auscultation bilaterally Breasts:  No dominant palpable mass, retraction, or nipple discharge Abdomen:  Soft, non tender, no hepatosplenomegaly or masses Pelvic:  External genitalia is normal in appearance.  The vagina is normal but atrophic in appearance. The cervix is bulbous, no CMT.  Thin prep pap is not done. Uterus is felt to be normal size, shape, and  contour.  No adnexal masses or tenderness noted. Extremities:  No swelling or varicosities noted Psych:  She has a normal mood and affect  Assessment:   Healthy well-woman exam of postmenopasual female                Plan:  Will do pap next year at patient request F/U 1 year for AE, or sooner if needed Mammogram done at [facility] every  6 months   Nike Southwell Rockney Ghee, CNM

## 2018-09-07 DIAGNOSIS — M5414 Radiculopathy, thoracic region: Secondary | ICD-10-CM | POA: Diagnosis not present

## 2018-09-07 DIAGNOSIS — M5416 Radiculopathy, lumbar region: Secondary | ICD-10-CM | POA: Diagnosis not present

## 2018-09-07 DIAGNOSIS — M5136 Other intervertebral disc degeneration, lumbar region: Secondary | ICD-10-CM | POA: Diagnosis not present

## 2018-09-14 DIAGNOSIS — J3089 Other allergic rhinitis: Secondary | ICD-10-CM | POA: Diagnosis not present

## 2018-09-14 DIAGNOSIS — J301 Allergic rhinitis due to pollen: Secondary | ICD-10-CM | POA: Diagnosis not present

## 2018-09-21 DIAGNOSIS — J3089 Other allergic rhinitis: Secondary | ICD-10-CM | POA: Diagnosis not present

## 2018-09-21 DIAGNOSIS — J301 Allergic rhinitis due to pollen: Secondary | ICD-10-CM | POA: Diagnosis not present

## 2018-09-25 DIAGNOSIS — J101 Influenza due to other identified influenza virus with other respiratory manifestations: Secondary | ICD-10-CM | POA: Diagnosis not present

## 2018-09-25 DIAGNOSIS — J301 Allergic rhinitis due to pollen: Secondary | ICD-10-CM | POA: Diagnosis not present

## 2018-09-25 DIAGNOSIS — I10 Essential (primary) hypertension: Secondary | ICD-10-CM | POA: Diagnosis not present

## 2018-09-25 DIAGNOSIS — J069 Acute upper respiratory infection, unspecified: Secondary | ICD-10-CM | POA: Diagnosis not present

## 2018-09-25 DIAGNOSIS — J3089 Other allergic rhinitis: Secondary | ICD-10-CM | POA: Diagnosis not present

## 2018-09-25 DIAGNOSIS — J329 Chronic sinusitis, unspecified: Secondary | ICD-10-CM | POA: Diagnosis not present

## 2018-10-01 DIAGNOSIS — J301 Allergic rhinitis due to pollen: Secondary | ICD-10-CM | POA: Diagnosis not present

## 2018-10-01 DIAGNOSIS — J3089 Other allergic rhinitis: Secondary | ICD-10-CM | POA: Diagnosis not present

## 2018-10-09 DIAGNOSIS — J3089 Other allergic rhinitis: Secondary | ICD-10-CM | POA: Diagnosis not present

## 2018-10-09 DIAGNOSIS — J301 Allergic rhinitis due to pollen: Secondary | ICD-10-CM | POA: Diagnosis not present

## 2018-10-23 DIAGNOSIS — N3010 Interstitial cystitis (chronic) without hematuria: Secondary | ICD-10-CM | POA: Diagnosis not present

## 2018-10-23 DIAGNOSIS — J329 Chronic sinusitis, unspecified: Secondary | ICD-10-CM | POA: Diagnosis not present

## 2018-10-23 DIAGNOSIS — I10 Essential (primary) hypertension: Secondary | ICD-10-CM | POA: Diagnosis not present

## 2018-10-23 DIAGNOSIS — J449 Chronic obstructive pulmonary disease, unspecified: Secondary | ICD-10-CM | POA: Diagnosis not present

## 2018-10-24 DIAGNOSIS — R079 Chest pain, unspecified: Secondary | ICD-10-CM | POA: Diagnosis not present

## 2018-10-24 DIAGNOSIS — E034 Atrophy of thyroid (acquired): Secondary | ICD-10-CM | POA: Diagnosis not present

## 2018-10-24 DIAGNOSIS — I10 Essential (primary) hypertension: Secondary | ICD-10-CM | POA: Diagnosis not present

## 2018-10-24 DIAGNOSIS — R5381 Other malaise: Secondary | ICD-10-CM | POA: Diagnosis not present

## 2018-10-25 DIAGNOSIS — J301 Allergic rhinitis due to pollen: Secondary | ICD-10-CM | POA: Diagnosis not present

## 2018-10-25 DIAGNOSIS — J3089 Other allergic rhinitis: Secondary | ICD-10-CM | POA: Diagnosis not present

## 2018-10-29 DIAGNOSIS — J329 Chronic sinusitis, unspecified: Secondary | ICD-10-CM | POA: Diagnosis not present

## 2018-10-29 DIAGNOSIS — I10 Essential (primary) hypertension: Secondary | ICD-10-CM | POA: Diagnosis not present

## 2018-10-29 DIAGNOSIS — N3010 Interstitial cystitis (chronic) without hematuria: Secondary | ICD-10-CM | POA: Diagnosis not present

## 2018-10-29 DIAGNOSIS — J449 Chronic obstructive pulmonary disease, unspecified: Secondary | ICD-10-CM | POA: Diagnosis not present

## 2018-10-30 DIAGNOSIS — J301 Allergic rhinitis due to pollen: Secondary | ICD-10-CM | POA: Diagnosis not present

## 2018-10-30 DIAGNOSIS — J3089 Other allergic rhinitis: Secondary | ICD-10-CM | POA: Diagnosis not present

## 2018-11-08 DIAGNOSIS — J301 Allergic rhinitis due to pollen: Secondary | ICD-10-CM | POA: Diagnosis not present

## 2018-11-08 DIAGNOSIS — J3089 Other allergic rhinitis: Secondary | ICD-10-CM | POA: Diagnosis not present

## 2018-11-13 DIAGNOSIS — J301 Allergic rhinitis due to pollen: Secondary | ICD-10-CM | POA: Diagnosis not present

## 2018-11-13 DIAGNOSIS — J3089 Other allergic rhinitis: Secondary | ICD-10-CM | POA: Diagnosis not present

## 2018-11-20 DIAGNOSIS — J301 Allergic rhinitis due to pollen: Secondary | ICD-10-CM | POA: Diagnosis not present

## 2018-11-20 DIAGNOSIS — N184 Chronic kidney disease, stage 4 (severe): Secondary | ICD-10-CM | POA: Diagnosis not present

## 2018-11-20 DIAGNOSIS — J329 Chronic sinusitis, unspecified: Secondary | ICD-10-CM | POA: Diagnosis not present

## 2018-11-20 DIAGNOSIS — J3089 Other allergic rhinitis: Secondary | ICD-10-CM | POA: Diagnosis not present

## 2018-11-20 DIAGNOSIS — Z8739 Personal history of other diseases of the musculoskeletal system and connective tissue: Secondary | ICD-10-CM | POA: Diagnosis not present

## 2018-11-20 DIAGNOSIS — N3020 Other chronic cystitis without hematuria: Secondary | ICD-10-CM | POA: Diagnosis not present

## 2018-11-29 DIAGNOSIS — J3089 Other allergic rhinitis: Secondary | ICD-10-CM | POA: Diagnosis not present

## 2018-11-29 DIAGNOSIS — J301 Allergic rhinitis due to pollen: Secondary | ICD-10-CM | POA: Diagnosis not present

## 2018-12-06 DIAGNOSIS — J3089 Other allergic rhinitis: Secondary | ICD-10-CM | POA: Diagnosis not present

## 2018-12-06 DIAGNOSIS — J301 Allergic rhinitis due to pollen: Secondary | ICD-10-CM | POA: Diagnosis not present

## 2018-12-07 DIAGNOSIS — M5136 Other intervertebral disc degeneration, lumbar region: Secondary | ICD-10-CM | POA: Diagnosis not present

## 2018-12-07 DIAGNOSIS — M5416 Radiculopathy, lumbar region: Secondary | ICD-10-CM | POA: Diagnosis not present

## 2018-12-07 DIAGNOSIS — M5414 Radiculopathy, thoracic region: Secondary | ICD-10-CM | POA: Diagnosis not present

## 2018-12-07 DIAGNOSIS — M2550 Pain in unspecified joint: Secondary | ICD-10-CM | POA: Diagnosis not present

## 2018-12-13 DIAGNOSIS — J3089 Other allergic rhinitis: Secondary | ICD-10-CM | POA: Diagnosis not present

## 2018-12-13 DIAGNOSIS — J301 Allergic rhinitis due to pollen: Secondary | ICD-10-CM | POA: Diagnosis not present

## 2018-12-18 DIAGNOSIS — J329 Chronic sinusitis, unspecified: Secondary | ICD-10-CM | POA: Diagnosis not present

## 2018-12-18 DIAGNOSIS — Z8739 Personal history of other diseases of the musculoskeletal system and connective tissue: Secondary | ICD-10-CM | POA: Diagnosis not present

## 2018-12-18 DIAGNOSIS — J301 Allergic rhinitis due to pollen: Secondary | ICD-10-CM | POA: Diagnosis not present

## 2018-12-18 DIAGNOSIS — J3089 Other allergic rhinitis: Secondary | ICD-10-CM | POA: Diagnosis not present

## 2018-12-18 DIAGNOSIS — N184 Chronic kidney disease, stage 4 (severe): Secondary | ICD-10-CM | POA: Diagnosis not present

## 2018-12-18 DIAGNOSIS — N3020 Other chronic cystitis without hematuria: Secondary | ICD-10-CM | POA: Diagnosis not present

## 2018-12-25 DIAGNOSIS — J3089 Other allergic rhinitis: Secondary | ICD-10-CM | POA: Diagnosis not present

## 2018-12-25 DIAGNOSIS — J301 Allergic rhinitis due to pollen: Secondary | ICD-10-CM | POA: Diagnosis not present

## 2019-01-01 DIAGNOSIS — J301 Allergic rhinitis due to pollen: Secondary | ICD-10-CM | POA: Diagnosis not present

## 2019-01-01 DIAGNOSIS — J3089 Other allergic rhinitis: Secondary | ICD-10-CM | POA: Diagnosis not present

## 2019-01-08 DIAGNOSIS — J3089 Other allergic rhinitis: Secondary | ICD-10-CM | POA: Diagnosis not present

## 2019-01-08 DIAGNOSIS — J301 Allergic rhinitis due to pollen: Secondary | ICD-10-CM | POA: Diagnosis not present

## 2019-01-15 DIAGNOSIS — N184 Chronic kidney disease, stage 4 (severe): Secondary | ICD-10-CM | POA: Diagnosis not present

## 2019-01-15 DIAGNOSIS — K4020 Bilateral inguinal hernia, without obstruction or gangrene, not specified as recurrent: Secondary | ICD-10-CM | POA: Diagnosis not present

## 2019-01-15 DIAGNOSIS — J3089 Other allergic rhinitis: Secondary | ICD-10-CM | POA: Diagnosis not present

## 2019-01-15 DIAGNOSIS — N200 Calculus of kidney: Secondary | ICD-10-CM | POA: Diagnosis not present

## 2019-01-15 DIAGNOSIS — J301 Allergic rhinitis due to pollen: Secondary | ICD-10-CM | POA: Diagnosis not present

## 2019-01-15 DIAGNOSIS — N3020 Other chronic cystitis without hematuria: Secondary | ICD-10-CM | POA: Diagnosis not present

## 2019-01-22 DIAGNOSIS — J301 Allergic rhinitis due to pollen: Secondary | ICD-10-CM | POA: Diagnosis not present

## 2019-01-22 DIAGNOSIS — J3089 Other allergic rhinitis: Secondary | ICD-10-CM | POA: Diagnosis not present

## 2019-01-23 DIAGNOSIS — M5136 Other intervertebral disc degeneration, lumbar region: Secondary | ICD-10-CM | POA: Diagnosis not present

## 2019-01-23 DIAGNOSIS — M2550 Pain in unspecified joint: Secondary | ICD-10-CM | POA: Insufficient documentation

## 2019-01-23 DIAGNOSIS — Z13820 Encounter for screening for osteoporosis: Secondary | ICD-10-CM | POA: Insufficient documentation

## 2019-01-23 DIAGNOSIS — M791 Myalgia, unspecified site: Secondary | ICD-10-CM | POA: Diagnosis not present

## 2019-01-23 DIAGNOSIS — E550 Rickets, active: Secondary | ICD-10-CM | POA: Diagnosis not present

## 2019-02-01 DIAGNOSIS — J301 Allergic rhinitis due to pollen: Secondary | ICD-10-CM | POA: Diagnosis not present

## 2019-02-01 DIAGNOSIS — J3089 Other allergic rhinitis: Secondary | ICD-10-CM | POA: Diagnosis not present

## 2019-02-06 DIAGNOSIS — M25561 Pain in right knee: Secondary | ICD-10-CM | POA: Diagnosis not present

## 2019-02-06 DIAGNOSIS — M5416 Radiculopathy, lumbar region: Secondary | ICD-10-CM | POA: Diagnosis not present

## 2019-02-06 DIAGNOSIS — M2550 Pain in unspecified joint: Secondary | ICD-10-CM | POA: Diagnosis not present

## 2019-02-06 DIAGNOSIS — M25461 Effusion, right knee: Secondary | ICD-10-CM | POA: Diagnosis not present

## 2019-02-06 DIAGNOSIS — R600 Localized edema: Secondary | ICD-10-CM | POA: Diagnosis not present

## 2019-02-07 ENCOUNTER — Other Ambulatory Visit: Payer: Self-pay

## 2019-02-07 DIAGNOSIS — Z20822 Contact with and (suspected) exposure to covid-19: Secondary | ICD-10-CM

## 2019-02-07 DIAGNOSIS — R6889 Other general symptoms and signs: Secondary | ICD-10-CM | POA: Diagnosis not present

## 2019-02-10 LAB — NOVEL CORONAVIRUS, NAA: SARS-CoV-2, NAA: NOT DETECTED

## 2019-02-13 DIAGNOSIS — K4020 Bilateral inguinal hernia, without obstruction or gangrene, not specified as recurrent: Secondary | ICD-10-CM | POA: Diagnosis not present

## 2019-02-13 DIAGNOSIS — N184 Chronic kidney disease, stage 4 (severe): Secondary | ICD-10-CM | POA: Diagnosis not present

## 2019-02-13 DIAGNOSIS — N200 Calculus of kidney: Secondary | ICD-10-CM | POA: Diagnosis not present

## 2019-02-15 DIAGNOSIS — J3089 Other allergic rhinitis: Secondary | ICD-10-CM | POA: Diagnosis not present

## 2019-02-15 DIAGNOSIS — J301 Allergic rhinitis due to pollen: Secondary | ICD-10-CM | POA: Diagnosis not present

## 2019-02-26 DIAGNOSIS — J3089 Other allergic rhinitis: Secondary | ICD-10-CM | POA: Diagnosis not present

## 2019-02-26 DIAGNOSIS — J301 Allergic rhinitis due to pollen: Secondary | ICD-10-CM | POA: Diagnosis not present

## 2019-03-05 DIAGNOSIS — J301 Allergic rhinitis due to pollen: Secondary | ICD-10-CM | POA: Diagnosis not present

## 2019-03-05 DIAGNOSIS — J3089 Other allergic rhinitis: Secondary | ICD-10-CM | POA: Diagnosis not present

## 2019-03-07 DIAGNOSIS — R600 Localized edema: Secondary | ICD-10-CM | POA: Diagnosis not present

## 2019-03-07 DIAGNOSIS — N200 Calculus of kidney: Secondary | ICD-10-CM | POA: Diagnosis not present

## 2019-03-07 DIAGNOSIS — N183 Chronic kidney disease, stage 3 (moderate): Secondary | ICD-10-CM | POA: Diagnosis not present

## 2019-03-07 DIAGNOSIS — I129 Hypertensive chronic kidney disease with stage 1 through stage 4 chronic kidney disease, or unspecified chronic kidney disease: Secondary | ICD-10-CM | POA: Diagnosis not present

## 2019-03-08 DIAGNOSIS — M5136 Other intervertebral disc degeneration, lumbar region: Secondary | ICD-10-CM | POA: Diagnosis not present

## 2019-03-08 DIAGNOSIS — M5414 Radiculopathy, thoracic region: Secondary | ICD-10-CM | POA: Diagnosis not present

## 2019-03-08 DIAGNOSIS — M5416 Radiculopathy, lumbar region: Secondary | ICD-10-CM | POA: Diagnosis not present

## 2019-03-12 DIAGNOSIS — K4020 Bilateral inguinal hernia, without obstruction or gangrene, not specified as recurrent: Secondary | ICD-10-CM | POA: Diagnosis not present

## 2019-03-12 DIAGNOSIS — J3089 Other allergic rhinitis: Secondary | ICD-10-CM | POA: Diagnosis not present

## 2019-03-12 DIAGNOSIS — N200 Calculus of kidney: Secondary | ICD-10-CM | POA: Diagnosis not present

## 2019-03-12 DIAGNOSIS — N3020 Other chronic cystitis without hematuria: Secondary | ICD-10-CM | POA: Diagnosis not present

## 2019-03-12 DIAGNOSIS — N184 Chronic kidney disease, stage 4 (severe): Secondary | ICD-10-CM | POA: Diagnosis not present

## 2019-03-12 DIAGNOSIS — J301 Allergic rhinitis due to pollen: Secondary | ICD-10-CM | POA: Diagnosis not present

## 2019-03-19 DIAGNOSIS — J301 Allergic rhinitis due to pollen: Secondary | ICD-10-CM | POA: Diagnosis not present

## 2019-03-19 DIAGNOSIS — J3089 Other allergic rhinitis: Secondary | ICD-10-CM | POA: Diagnosis not present

## 2019-03-28 DIAGNOSIS — J301 Allergic rhinitis due to pollen: Secondary | ICD-10-CM | POA: Diagnosis not present

## 2019-03-28 DIAGNOSIS — J3089 Other allergic rhinitis: Secondary | ICD-10-CM | POA: Diagnosis not present

## 2019-04-02 DIAGNOSIS — J3089 Other allergic rhinitis: Secondary | ICD-10-CM | POA: Diagnosis not present

## 2019-04-02 DIAGNOSIS — J301 Allergic rhinitis due to pollen: Secondary | ICD-10-CM | POA: Diagnosis not present

## 2019-04-08 DIAGNOSIS — J3089 Other allergic rhinitis: Secondary | ICD-10-CM | POA: Diagnosis not present

## 2019-04-08 DIAGNOSIS — J301 Allergic rhinitis due to pollen: Secondary | ICD-10-CM | POA: Diagnosis not present

## 2019-04-09 DIAGNOSIS — M6240 Contracture of muscle, unspecified site: Secondary | ICD-10-CM | POA: Diagnosis not present

## 2019-04-09 DIAGNOSIS — J301 Allergic rhinitis due to pollen: Secondary | ICD-10-CM | POA: Diagnosis not present

## 2019-04-09 DIAGNOSIS — Z23 Encounter for immunization: Secondary | ICD-10-CM | POA: Diagnosis not present

## 2019-04-09 DIAGNOSIS — N184 Chronic kidney disease, stage 4 (severe): Secondary | ICD-10-CM | POA: Diagnosis not present

## 2019-04-09 DIAGNOSIS — J3089 Other allergic rhinitis: Secondary | ICD-10-CM | POA: Diagnosis not present

## 2019-04-09 DIAGNOSIS — I10 Essential (primary) hypertension: Secondary | ICD-10-CM | POA: Diagnosis not present

## 2019-04-09 DIAGNOSIS — N3020 Other chronic cystitis without hematuria: Secondary | ICD-10-CM | POA: Diagnosis not present

## 2019-04-16 DIAGNOSIS — J3089 Other allergic rhinitis: Secondary | ICD-10-CM | POA: Diagnosis not present

## 2019-04-16 DIAGNOSIS — J301 Allergic rhinitis due to pollen: Secondary | ICD-10-CM | POA: Diagnosis not present

## 2019-04-22 DIAGNOSIS — Z86000 Personal history of in-situ neoplasm of breast: Secondary | ICD-10-CM | POA: Diagnosis not present

## 2019-04-22 DIAGNOSIS — Z6832 Body mass index (BMI) 32.0-32.9, adult: Secondary | ICD-10-CM | POA: Diagnosis not present

## 2019-04-22 DIAGNOSIS — Z9049 Acquired absence of other specified parts of digestive tract: Secondary | ICD-10-CM | POA: Diagnosis not present

## 2019-04-22 DIAGNOSIS — Z1231 Encounter for screening mammogram for malignant neoplasm of breast: Secondary | ICD-10-CM | POA: Diagnosis not present

## 2019-04-22 DIAGNOSIS — N289 Disorder of kidney and ureter, unspecified: Secondary | ICD-10-CM | POA: Diagnosis not present

## 2019-04-22 DIAGNOSIS — Z79899 Other long term (current) drug therapy: Secondary | ICD-10-CM | POA: Diagnosis not present

## 2019-04-22 DIAGNOSIS — N6099 Unspecified benign mammary dysplasia of unspecified breast: Secondary | ICD-10-CM | POA: Diagnosis not present

## 2019-04-22 DIAGNOSIS — N6091 Unspecified benign mammary dysplasia of right breast: Secondary | ICD-10-CM | POA: Diagnosis not present

## 2019-04-22 DIAGNOSIS — I10 Essential (primary) hypertension: Secondary | ICD-10-CM | POA: Diagnosis not present

## 2019-04-22 DIAGNOSIS — J45909 Unspecified asthma, uncomplicated: Secondary | ICD-10-CM | POA: Diagnosis not present

## 2019-04-22 DIAGNOSIS — M1990 Unspecified osteoarthritis, unspecified site: Secondary | ICD-10-CM | POA: Diagnosis not present

## 2019-04-23 DIAGNOSIS — J301 Allergic rhinitis due to pollen: Secondary | ICD-10-CM | POA: Diagnosis not present

## 2019-04-23 DIAGNOSIS — J3089 Other allergic rhinitis: Secondary | ICD-10-CM | POA: Diagnosis not present

## 2019-04-30 DIAGNOSIS — J301 Allergic rhinitis due to pollen: Secondary | ICD-10-CM | POA: Diagnosis not present

## 2019-04-30 DIAGNOSIS — J3089 Other allergic rhinitis: Secondary | ICD-10-CM | POA: Diagnosis not present

## 2019-05-07 DIAGNOSIS — N3020 Other chronic cystitis without hematuria: Secondary | ICD-10-CM | POA: Diagnosis not present

## 2019-05-07 DIAGNOSIS — J301 Allergic rhinitis due to pollen: Secondary | ICD-10-CM | POA: Diagnosis not present

## 2019-05-07 DIAGNOSIS — K4020 Bilateral inguinal hernia, without obstruction or gangrene, not specified as recurrent: Secondary | ICD-10-CM | POA: Diagnosis not present

## 2019-05-07 DIAGNOSIS — Z0000 Encounter for general adult medical examination without abnormal findings: Secondary | ICD-10-CM | POA: Diagnosis not present

## 2019-05-07 DIAGNOSIS — J3081 Allergic rhinitis due to animal (cat) (dog) hair and dander: Secondary | ICD-10-CM | POA: Diagnosis not present

## 2019-05-07 DIAGNOSIS — I10 Essential (primary) hypertension: Secondary | ICD-10-CM | POA: Diagnosis not present

## 2019-05-07 DIAGNOSIS — N184 Chronic kidney disease, stage 4 (severe): Secondary | ICD-10-CM | POA: Diagnosis not present

## 2019-05-07 DIAGNOSIS — J3089 Other allergic rhinitis: Secondary | ICD-10-CM | POA: Diagnosis not present

## 2019-05-09 DIAGNOSIS — R600 Localized edema: Secondary | ICD-10-CM | POA: Diagnosis not present

## 2019-05-09 DIAGNOSIS — M791 Myalgia, unspecified site: Secondary | ICD-10-CM | POA: Insufficient documentation

## 2019-05-09 DIAGNOSIS — M5416 Radiculopathy, lumbar region: Secondary | ICD-10-CM | POA: Diagnosis not present

## 2019-05-16 DIAGNOSIS — J3089 Other allergic rhinitis: Secondary | ICD-10-CM | POA: Diagnosis not present

## 2019-05-16 DIAGNOSIS — J301 Allergic rhinitis due to pollen: Secondary | ICD-10-CM | POA: Diagnosis not present

## 2019-05-21 DIAGNOSIS — J3089 Other allergic rhinitis: Secondary | ICD-10-CM | POA: Diagnosis not present

## 2019-05-21 DIAGNOSIS — J301 Allergic rhinitis due to pollen: Secondary | ICD-10-CM | POA: Diagnosis not present

## 2019-05-30 DIAGNOSIS — J3089 Other allergic rhinitis: Secondary | ICD-10-CM | POA: Diagnosis not present

## 2019-05-30 DIAGNOSIS — J301 Allergic rhinitis due to pollen: Secondary | ICD-10-CM | POA: Diagnosis not present

## 2019-06-04 DIAGNOSIS — J3089 Other allergic rhinitis: Secondary | ICD-10-CM | POA: Diagnosis not present

## 2019-06-04 DIAGNOSIS — J329 Chronic sinusitis, unspecified: Secondary | ICD-10-CM | POA: Diagnosis not present

## 2019-06-04 DIAGNOSIS — E669 Obesity, unspecified: Secondary | ICD-10-CM | POA: Diagnosis not present

## 2019-06-04 DIAGNOSIS — J301 Allergic rhinitis due to pollen: Secondary | ICD-10-CM | POA: Diagnosis not present

## 2019-06-04 DIAGNOSIS — E038 Other specified hypothyroidism: Secondary | ICD-10-CM | POA: Diagnosis not present

## 2019-06-04 DIAGNOSIS — E065 Other chronic thyroiditis: Secondary | ICD-10-CM | POA: Diagnosis not present

## 2019-06-07 DIAGNOSIS — M5136 Other intervertebral disc degeneration, lumbar region: Secondary | ICD-10-CM | POA: Diagnosis not present

## 2019-06-07 DIAGNOSIS — M5416 Radiculopathy, lumbar region: Secondary | ICD-10-CM | POA: Diagnosis not present

## 2019-06-07 DIAGNOSIS — M5414 Radiculopathy, thoracic region: Secondary | ICD-10-CM | POA: Diagnosis not present

## 2019-06-07 DIAGNOSIS — M2550 Pain in unspecified joint: Secondary | ICD-10-CM | POA: Diagnosis not present

## 2019-06-13 DIAGNOSIS — J3089 Other allergic rhinitis: Secondary | ICD-10-CM | POA: Diagnosis not present

## 2019-06-13 DIAGNOSIS — J301 Allergic rhinitis due to pollen: Secondary | ICD-10-CM | POA: Diagnosis not present

## 2019-06-18 DIAGNOSIS — J301 Allergic rhinitis due to pollen: Secondary | ICD-10-CM | POA: Diagnosis not present

## 2019-06-18 DIAGNOSIS — J3089 Other allergic rhinitis: Secondary | ICD-10-CM | POA: Diagnosis not present

## 2019-08-06 DIAGNOSIS — J4540 Moderate persistent asthma, uncomplicated: Secondary | ICD-10-CM | POA: Diagnosis not present

## 2019-08-06 DIAGNOSIS — J3089 Other allergic rhinitis: Secondary | ICD-10-CM | POA: Diagnosis not present

## 2019-08-06 DIAGNOSIS — L299 Pruritus, unspecified: Secondary | ICD-10-CM | POA: Diagnosis not present

## 2019-08-06 DIAGNOSIS — J301 Allergic rhinitis due to pollen: Secondary | ICD-10-CM | POA: Diagnosis not present

## 2019-08-08 DIAGNOSIS — M5136 Other intervertebral disc degeneration, lumbar region: Secondary | ICD-10-CM | POA: Diagnosis not present

## 2019-08-08 DIAGNOSIS — G8929 Other chronic pain: Secondary | ICD-10-CM | POA: Diagnosis not present

## 2019-08-08 DIAGNOSIS — M2040 Other hammer toe(s) (acquired), unspecified foot: Secondary | ICD-10-CM | POA: Insufficient documentation

## 2019-08-08 DIAGNOSIS — R52 Pain, unspecified: Secondary | ICD-10-CM | POA: Diagnosis not present

## 2019-08-15 DIAGNOSIS — J301 Allergic rhinitis due to pollen: Secondary | ICD-10-CM | POA: Diagnosis not present

## 2019-08-15 DIAGNOSIS — J3089 Other allergic rhinitis: Secondary | ICD-10-CM | POA: Diagnosis not present

## 2019-08-27 DIAGNOSIS — J301 Allergic rhinitis due to pollen: Secondary | ICD-10-CM | POA: Diagnosis not present

## 2019-08-27 DIAGNOSIS — J3089 Other allergic rhinitis: Secondary | ICD-10-CM | POA: Diagnosis not present

## 2019-09-03 DIAGNOSIS — J301 Allergic rhinitis due to pollen: Secondary | ICD-10-CM | POA: Diagnosis not present

## 2019-09-03 DIAGNOSIS — J3089 Other allergic rhinitis: Secondary | ICD-10-CM | POA: Diagnosis not present

## 2019-09-03 DIAGNOSIS — N184 Chronic kidney disease, stage 4 (severe): Secondary | ICD-10-CM | POA: Diagnosis not present

## 2019-09-03 DIAGNOSIS — J449 Chronic obstructive pulmonary disease, unspecified: Secondary | ICD-10-CM | POA: Diagnosis not present

## 2019-09-03 DIAGNOSIS — I10 Essential (primary) hypertension: Secondary | ICD-10-CM | POA: Diagnosis not present

## 2019-09-03 DIAGNOSIS — I739 Peripheral vascular disease, unspecified: Secondary | ICD-10-CM | POA: Diagnosis not present

## 2019-09-06 DIAGNOSIS — M5136 Other intervertebral disc degeneration, lumbar region: Secondary | ICD-10-CM | POA: Diagnosis not present

## 2019-09-06 DIAGNOSIS — M2550 Pain in unspecified joint: Secondary | ICD-10-CM | POA: Diagnosis not present

## 2019-09-06 DIAGNOSIS — M5414 Radiculopathy, thoracic region: Secondary | ICD-10-CM | POA: Diagnosis not present

## 2019-09-06 DIAGNOSIS — M5416 Radiculopathy, lumbar region: Secondary | ICD-10-CM | POA: Diagnosis not present

## 2019-09-10 ENCOUNTER — Encounter: Payer: Medicare Other | Admitting: Obstetrics and Gynecology

## 2019-09-11 DIAGNOSIS — R600 Localized edema: Secondary | ICD-10-CM | POA: Diagnosis not present

## 2019-09-11 DIAGNOSIS — N390 Urinary tract infection, site not specified: Secondary | ICD-10-CM | POA: Diagnosis not present

## 2019-09-11 DIAGNOSIS — Z85850 Personal history of malignant neoplasm of thyroid: Secondary | ICD-10-CM | POA: Diagnosis not present

## 2019-09-11 DIAGNOSIS — N1832 Chronic kidney disease, stage 3b: Secondary | ICD-10-CM | POA: Diagnosis not present

## 2019-09-11 DIAGNOSIS — N200 Calculus of kidney: Secondary | ICD-10-CM | POA: Insufficient documentation

## 2019-09-11 DIAGNOSIS — I10 Essential (primary) hypertension: Secondary | ICD-10-CM | POA: Diagnosis not present

## 2019-09-17 DIAGNOSIS — J3089 Other allergic rhinitis: Secondary | ICD-10-CM | POA: Diagnosis not present

## 2019-09-17 DIAGNOSIS — J301 Allergic rhinitis due to pollen: Secondary | ICD-10-CM | POA: Diagnosis not present

## 2019-09-19 ENCOUNTER — Other Ambulatory Visit: Payer: Self-pay

## 2019-09-19 ENCOUNTER — Encounter: Payer: Self-pay | Admitting: Obstetrics and Gynecology

## 2019-09-19 ENCOUNTER — Other Ambulatory Visit (HOSPITAL_COMMUNITY)
Admission: RE | Admit: 2019-09-19 | Discharge: 2019-09-19 | Disposition: A | Discharge status: Home / Self Care | Payer: Medicare Other | Source: Ambulatory Visit | Attending: Obstetrics and Gynecology | Admitting: Obstetrics and Gynecology

## 2019-09-19 ENCOUNTER — Ambulatory Visit (INDEPENDENT_AMBULATORY_CARE_PROVIDER_SITE_OTHER): Payer: Medicare Other | Admitting: Obstetrics and Gynecology

## 2019-09-19 VITALS — BP 147/84 | HR 74 | Ht 61.0 in | Wt 174.0 lb

## 2019-09-19 DIAGNOSIS — Z124 Encounter for screening for malignant neoplasm of cervix: Secondary | ICD-10-CM | POA: Diagnosis not present

## 2019-09-19 DIAGNOSIS — I10 Essential (primary) hypertension: Secondary | ICD-10-CM

## 2019-09-19 DIAGNOSIS — Z780 Asymptomatic menopausal state: Secondary | ICD-10-CM | POA: Diagnosis not present

## 2019-09-19 DIAGNOSIS — Z01419 Encounter for gynecological examination (general) (routine) without abnormal findings: Secondary | ICD-10-CM

## 2019-09-19 DIAGNOSIS — Z1151 Encounter for screening for human papillomavirus (HPV): Secondary | ICD-10-CM | POA: Insufficient documentation

## 2019-09-19 NOTE — Progress Notes (Signed)
HPI:      [name] is a 74 y.o. [phone] who LMP was No LMP recorded. Patient is postmenopausal.  Subjective:   She presents today for her annual examination.  She has no complaints today.  Of significant note she has had a previous diagnosis of breast ductal carcinoma in situ and is taking Evista for the last 3 years.  Her follow-up mammography has routinely been negative. Her last Pap smear was 3 years ago and did not include cotesting.    Hx: The following portions of the patient's history were reviewed and updated as appropriate:             She  has a past medical history of Asthma, Bulging lumbar disc, Cancer ([place]) (2035), Complication of anesthesia, DDD (degenerative disc disease), lumbar, DDD (degenerative disc disease), thoracolumbar, Diverticulosis, Family history of adverse reaction to anesthesia, GERD (gastroesophageal reflux disease), Heart murmur, Hepatitis, Hypertension, Hypothyroidism, Interstitial cystitis, Kidney disease, PONV (postoperative nausea and vomiting), and Thyroid disease. She does not have any pertinent problems on file. She  has a past surgical history that includes Tonsillectomy (1952); Excisional hemorrhoidectomy (1975); Thyroidectomy (1999); Bladder surgery (1984); Colonoscopy (2005, 2015); Hemorrhoid surgery (1975); Hernia repair (2015); Cataract extraction (2011); Dilation and curettage of uterus (1975); Breast surgery (5974,1638); Breast surgery (April 2017); Eye surgery (Left, 2011); and Cholecystectomy (N/A, 11/30/2015). Her family history includes Asthma in her paternal aunt; Bladder Cancer in her maternal aunt; Cancer in her father; Diabetes in her maternal aunt; Heart disease in her brother and mother. She  reports that she quit smoking about 33 years ago. She has a 15.00 pack-year smoking history. She has never used smokeless tobacco. She reports that she does not drink alcohol or use drugs. She has a current medication list which includes the  following prescription(s): albuterol, albuterol, aspirin, calcium, cetirizine, fluoxetine, fluticasone, fluticasone-salmeterol, furosemide, glucosamine-chondroitin, hydrocodone-acetaminophen, levothyroxine, uribel, olmesartan, fish oil, pantoprazole, potassium chloride sa, pregabalin, PRESCRIPTION MEDICATION, raloxifene, tizanidine, vitamin c, zolpidem, vitamin d-3, clonazepam, and gabapentin. She is allergic to pollen extract; ether; gramineae pollens; penicillins; tamsulosin; and band-aid plus antibiotic [bacitracin-polymyxin b].       Review of Systems:  Review of Systems  Constitutional: Denied constitutional symptoms, night sweats, recent illness, fatigue, fever, insomnia and weight loss.  Eyes: Denied eye symptoms, eye pain, photophobia, vision change and visual disturbance.  Ears/Nose/Throat/Neck: Denied ear, nose, throat or neck symptoms, hearing loss, nasal discharge, sinus congestion and sore throat.  Cardiovascular: Denied cardiovascular symptoms, arrhythmia, chest pain/pressure, edema, exercise intolerance, orthopnea and palpitations.  Respiratory: Denied pulmonary symptoms, asthma, pleuritic pain, productive sputum, cough, dyspnea and wheezing.  Gastrointestinal: Denied, gastro-esophageal reflux, melena, nausea and vomiting.  Genitourinary: Denied genitourinary symptoms including symptomatic vaginal discharge, pelvic relaxation issues, and urinary complaints.  Musculoskeletal: Denied musculoskeletal symptoms, stiffness, swelling, muscle weakness and myalgia.  Dermatologic: Denied dermatology symptoms, rash and scar.  Neurologic: Denied neurology symptoms, dizziness, headache, neck pain and syncope.  Psychiatric: Denied psychiatric symptoms, anxiety and depression.  Endocrine: Denied endocrine symptoms including hot flashes and night sweats.   Meds:   Current Outpatient Medications on File Prior to Visit  Medication Sig Dispense Refill  . albuterol (PROVENTIL) (2.5 MG/3ML) 0.083%  nebulizer solution Inhale into the lungs.    . ALBUTEROL IN Inhale into the lungs as needed.    Marland Kitchen aspirin 81 MG tablet Take 81 mg by mouth daily.    . Calcium 500 MG CHEW Chew by mouth.    . cetirizine (ZYRTEC) 10 MG tablet  Take 10 mg by mouth daily.    Marland Kitchen FLUoxetine (PROZAC) 40 MG capsule Take 40 mg by mouth every morning.    . fluticasone (FLONASE) 50 MCG/ACT nasal spray Place 1 spray into both nostrils daily.    . Fluticasone-Salmeterol (ADVAIR) 250-50 MCG/DOSE AEPB Inhale 1 puff into the lungs 3 (three) times daily as needed.     . furosemide (LASIX) 20 MG tablet Take 40 mg by mouth daily.     . Glucosamine-Chondroitin 500-400 MG CAPS Take by mouth.    Marland Kitchen HYDROcodone-acetaminophen (NORCO) 7.5-325 MG tablet Take by mouth.    . levothyroxine (SYNTHROID, LEVOTHROID) 150 MCG tablet Take 125 mcg by mouth daily before breakfast.     . Meth-Hyo-M Bl-Na Phos-Ph Sal (URIBEL) 118 MG CAPS Take 1 capsule (118 mg total) by mouth 4 (four) times daily as needed. 30 capsule 1  . olmesartan (BENICAR) 20 MG tablet Take 20 mg by mouth as needed (IF BP IS ELEVATED).    . Omega-3 Fatty Acids (FISH OIL) 1000 MG CAPS Take by mouth.    . pantoprazole (PROTONIX) 40 MG tablet Take 1 tablet by mouth every morning.     . potassium chloride SA (K-DUR,KLOR-CON) 20 MEQ tablet TK 1 T PO QD  2  . pregabalin (LYRICA) 50 MG capsule Take by mouth.    Marland Kitchen PRESCRIPTION MEDICATION Allergy injections once weekly    . raloxifene (EVISTA) 60 MG tablet   10  . tiZANidine (ZANAFLEX) 4 MG capsule Take 4 mg by mouth as needed.     . vitamin C (ASCORBIC ACID) 500 MG tablet Take by mouth.    . zolpidem (AMBIEN) 10 MG tablet Take 10 mg by mouth at bedtime as needed for sleep.    . Cholecalciferol (VITAMIN D-3) 1000 units CAPS Take by mouth daily.    . clonazePAM (KLONOPIN) 0.5 MG tablet Take 0.5 mg by mouth 2 (two) times daily as needed for anxiety.    . gabapentin (NEURONTIN) 100 MG capsule TAKE 2 CAPSULES BY MOUTH TWICE DAILY     No  current facility-administered medications on file prior to visit.    Objective:     Vitals:   09/19/19 1338  BP: (!) 147/84  Pulse: 74              Physical examination General NAD, Conversant  HEENT Atraumatic; Op clear with mmm.  Normo-cephalic. Pupils reactive. Anicteric sclerae  Thyroid/Neck Smooth without nodularity or enlargement. Normal ROM.  Neck Supple.  Skin No rashes, lesions or ulceration. Normal palpated skin turgor. No nodularity.  Breasts: No masses or discharge.  Symmetric.  No axillary adenopathy.  Lungs: Clear to auscultation.No rales or wheezes. Normal Respiratory effort, no retractions.  Heart: NSR.  No murmurs or rubs appreciated. No periferal edema  Abdomen: Soft.  Non-tender.  No masses.  No HSM. No hernia  Extremities: Moves all appropriately.  Normal ROM for age. No lymphadenopathy.  Neuro: Oriented to PPT.  Normal mood. Normal affect.     Pelvic:   Vulva: Normal appearance.  No lesions.  Vagina: No lesions or abnormalities noted.  Atrophic  Support: Normal pelvic support.  Urethra No masses tenderness or scarring.  Meatus Normal size without lesions or prolapse.  Cervix: Normal appearance.  No lesions.  Anus: Normal exam.  No lesions.  Perineum: Normal exam.  No lesions.        Bimanual   Uterus: Normal size.  Non-tender.  Mobile.  AV.  Adnexae: No masses.  Non-tender to  palpation.  Cul-de-sac: Negative for abnormality.      Assessment:    [id number] Patient Active Problem List   Diagnosis Date Noted  . Chronic venous insufficiency 07/28/2017  . Hypertension 07/27/2017  . Stage 4 chronic kidney disease ([place]) 05/26/2017  . Visual disturbance 05/26/2017  . Lower extremity pain, bilateral 05/26/2017  . Bilateral lower extremity edema 05/26/2017  . PMB (postmenopausal bleeding) 12/24/2015  . Hemorrhoid 12/24/2015  . Neoplasm of breast, primary tumor staging category Tis: lobular carcinoma in situ (LCIS) 12/24/2015  . Neuritis or radiculitis  due to rupture of lumbar intervertebral disc 05/23/2014  . Degeneration of intervertebral disc of lumbar region 05/23/2014     1. Well woman exam with routine gynecological exam        Plan:            1.  Basic Screening Recommendations The basic screening recommendations for asymptomatic women were discussed with the patient during her visit.  The age-appropriate recommendations were discussed with her and the rational for the tests reviewed.  When I am informed by the patient that another primary care physician has previously obtained the age-appropriate tests and they are up-to-date, only outstanding tests are ordered and referrals given as necessary.  Abnormal results of tests will be discussed with her when all of her results are completed.  Routine preventative health maintenance measures emphasized: Exercise/Diet/Weight control, Tobacco Warnings, Alcohol/Substance use risks and Stress Management Pap with cotest performed-if both are normal patient no longer needs routine Pap. 2.  Mammograms as scheduled and appropriate follow-up for breast ductal carcinoma in situ 3.  Patient given colon cancer screening test to perform at her request. Orders No orders of the defined types were placed in this encounter.   No orders of the defined types were placed in this encounter.       F/U  No follow-ups on file.  Finis Bud, M.D. 09/19/2019 2:11 PM

## 2019-09-19 NOTE — Addendum Note (Signed)
Addended by: Durwin Glaze on: 09/19/2019 03:08 PM   Modules accepted: Orders

## 2019-09-25 LAB — CYTOLOGY - PAP
Comment: NEGATIVE
Diagnosis: NEGATIVE
High risk HPV: NEGATIVE

## 2019-10-01 DIAGNOSIS — J3089 Other allergic rhinitis: Secondary | ICD-10-CM | POA: Diagnosis not present

## 2019-10-01 DIAGNOSIS — J301 Allergic rhinitis due to pollen: Secondary | ICD-10-CM | POA: Diagnosis not present

## 2019-10-15 DIAGNOSIS — J3089 Other allergic rhinitis: Secondary | ICD-10-CM | POA: Diagnosis not present

## 2019-10-15 DIAGNOSIS — J301 Allergic rhinitis due to pollen: Secondary | ICD-10-CM | POA: Diagnosis not present

## 2019-10-24 DIAGNOSIS — J3089 Other allergic rhinitis: Secondary | ICD-10-CM | POA: Diagnosis not present

## 2019-10-24 DIAGNOSIS — J301 Allergic rhinitis due to pollen: Secondary | ICD-10-CM | POA: Diagnosis not present

## 2019-11-05 DIAGNOSIS — J3081 Allergic rhinitis due to animal (cat) (dog) hair and dander: Secondary | ICD-10-CM | POA: Diagnosis not present

## 2019-11-05 DIAGNOSIS — J301 Allergic rhinitis due to pollen: Secondary | ICD-10-CM | POA: Diagnosis not present

## 2019-11-05 DIAGNOSIS — J3089 Other allergic rhinitis: Secondary | ICD-10-CM | POA: Diagnosis not present

## 2019-11-12 DIAGNOSIS — J301 Allergic rhinitis due to pollen: Secondary | ICD-10-CM | POA: Diagnosis not present

## 2019-11-12 DIAGNOSIS — J3089 Other allergic rhinitis: Secondary | ICD-10-CM | POA: Diagnosis not present

## 2019-11-14 DIAGNOSIS — J3089 Other allergic rhinitis: Secondary | ICD-10-CM | POA: Diagnosis not present

## 2019-11-14 DIAGNOSIS — J301 Allergic rhinitis due to pollen: Secondary | ICD-10-CM | POA: Diagnosis not present

## 2019-11-19 DIAGNOSIS — J301 Allergic rhinitis due to pollen: Secondary | ICD-10-CM | POA: Diagnosis not present

## 2019-11-19 DIAGNOSIS — J3089 Other allergic rhinitis: Secondary | ICD-10-CM | POA: Diagnosis not present

## 2019-11-26 DIAGNOSIS — J301 Allergic rhinitis due to pollen: Secondary | ICD-10-CM | POA: Diagnosis not present

## 2019-11-26 DIAGNOSIS — J3089 Other allergic rhinitis: Secondary | ICD-10-CM | POA: Diagnosis not present

## 2019-11-28 DIAGNOSIS — J301 Allergic rhinitis due to pollen: Secondary | ICD-10-CM | POA: Diagnosis not present

## 2019-11-28 DIAGNOSIS — J3089 Other allergic rhinitis: Secondary | ICD-10-CM | POA: Diagnosis not present

## 2019-12-03 DIAGNOSIS — J301 Allergic rhinitis due to pollen: Secondary | ICD-10-CM | POA: Diagnosis not present

## 2019-12-03 DIAGNOSIS — J3089 Other allergic rhinitis: Secondary | ICD-10-CM | POA: Diagnosis not present

## 2019-12-04 DIAGNOSIS — M5414 Radiculopathy, thoracic region: Secondary | ICD-10-CM | POA: Diagnosis not present

## 2019-12-04 DIAGNOSIS — M5136 Other intervertebral disc degeneration, lumbar region: Secondary | ICD-10-CM | POA: Diagnosis not present

## 2019-12-04 DIAGNOSIS — M5416 Radiculopathy, lumbar region: Secondary | ICD-10-CM | POA: Diagnosis not present

## 2019-12-06 DIAGNOSIS — J301 Allergic rhinitis due to pollen: Secondary | ICD-10-CM | POA: Diagnosis not present

## 2019-12-06 DIAGNOSIS — J3089 Other allergic rhinitis: Secondary | ICD-10-CM | POA: Diagnosis not present

## 2019-12-10 ENCOUNTER — Other Ambulatory Visit: Payer: Self-pay | Admitting: Internal Medicine

## 2019-12-10 DIAGNOSIS — J3089 Other allergic rhinitis: Secondary | ICD-10-CM | POA: Diagnosis not present

## 2019-12-10 DIAGNOSIS — J301 Allergic rhinitis due to pollen: Secondary | ICD-10-CM | POA: Diagnosis not present

## 2019-12-19 DIAGNOSIS — J3089 Other allergic rhinitis: Secondary | ICD-10-CM | POA: Diagnosis not present

## 2019-12-19 DIAGNOSIS — J301 Allergic rhinitis due to pollen: Secondary | ICD-10-CM | POA: Diagnosis not present

## 2019-12-24 DIAGNOSIS — J301 Allergic rhinitis due to pollen: Secondary | ICD-10-CM | POA: Diagnosis not present

## 2019-12-24 DIAGNOSIS — J3089 Other allergic rhinitis: Secondary | ICD-10-CM | POA: Diagnosis not present

## 2020-01-02 ENCOUNTER — Other Ambulatory Visit: Payer: Self-pay | Admitting: Internal Medicine

## 2020-01-03 DIAGNOSIS — J3089 Other allergic rhinitis: Secondary | ICD-10-CM | POA: Diagnosis not present

## 2020-01-03 DIAGNOSIS — J301 Allergic rhinitis due to pollen: Secondary | ICD-10-CM | POA: Diagnosis not present

## 2020-01-10 ENCOUNTER — Ambulatory Visit (INDEPENDENT_AMBULATORY_CARE_PROVIDER_SITE_OTHER): Payer: Medicare Other | Admitting: Internal Medicine

## 2020-01-10 ENCOUNTER — Other Ambulatory Visit: Payer: Self-pay

## 2020-01-10 ENCOUNTER — Encounter: Payer: Self-pay | Admitting: Internal Medicine

## 2020-01-10 VITALS — BP 124/75 | HR 80 | Ht 62.0 in | Wt 183.2 lb

## 2020-01-10 DIAGNOSIS — M5136 Other intervertebral disc degeneration, lumbar region: Secondary | ICD-10-CM | POA: Diagnosis not present

## 2020-01-10 DIAGNOSIS — M5116 Intervertebral disc disorders with radiculopathy, lumbar region: Secondary | ICD-10-CM

## 2020-01-10 DIAGNOSIS — G4700 Insomnia, unspecified: Secondary | ICD-10-CM | POA: Diagnosis not present

## 2020-01-10 DIAGNOSIS — I10 Essential (primary) hypertension: Secondary | ICD-10-CM | POA: Diagnosis not present

## 2020-01-10 DIAGNOSIS — N184 Chronic kidney disease, stage 4 (severe): Secondary | ICD-10-CM | POA: Diagnosis not present

## 2020-01-10 MED ORDER — ZOLPIDEM TARTRATE 10 MG PO TABS: 10.0000 mg | ORAL_TABLET | Freq: Every day | ORAL | 1 refills | Status: DC

## 2020-01-10 NOTE — Progress Notes (Addendum)
Established Patient Office Visit  SUBJECTIVE:  Patient ID: [name], female    DOB: [birth year]  Age: 74 y.o. MRN: [id number]  CC:  Chief Complaint  Patient presents with  . Insomnia    Pt is here for a refill of her Ambien    HPI [name] presents for an evaluation of her insomnia and refill for her Karlyn Agee.  She notes that she is still having back issues and she has been seeing Dr. Sharlet Salina for her degenerative disc disease and her bulging lumbar disc.   She says that she has been feeling like her feet are wet, but when she touches them to check, they are dry.   Her thyroid is stable, and she is taking 150 mg synthroid without difficulty.   Past Medical History:  Diagnosis Date  . Asthma    WELL CONTROLLED  . Bulging lumbar disc   . Cancer [facility]) 1999   thyroid with recurrent 2006  . Complication of anesthesia    HEART STOPPED DURING TONSILLECTOMY (AGE 51) DUE TO ALLERGY TO ETHER  . DDD (degenerative disc disease), lumbar   . DDD (degenerative disc disease), thoracolumbar   . Diverticulosis   . Family history of adverse reaction to anesthesia    PTS FATHER-UNSURE WHAT HAPPENED  . GERD (gastroesophageal reflux disease)   . Heart murmur   . Hepatitis    AGE 58 "VIRAL"  . Hypertension    OFF MEDS CURRENTLY PER PCP (Phillip Maffei)  . Hypothyroidism   . Interstitial cystitis   . Kidney disease   . PONV (postoperative nausea and vomiting)   . Thyroid disease     Past Surgical History:  Procedure Laterality Date  . BLADDER SURGERY  1984  . BREAST SURGERY  [id number]   mass removed  . BREAST SURGERY  April 2017   Dr Jack [facility]o [facility]  . CATARACT EXTRACTION  2011  . CHOLECYSTECTOMY N/A 11/30/2015   Procedure: LAPAROSCOPIC CHOLECYSTECTOMY WITH INTRAOPERATIVE CHOLANGIOGRAM;  Surgeon: Christene Lye, MD;  Location: [facility] ORS;  Service: General;  Laterality: N/A;  . COLONOSCOPY  2005, 2015  . DILATION AND CURETTAGE OF UTERUS  1975  . EXCISIONAL  HEMORRHOIDECTOMY  1975  . EYE SURGERY Left 2011  . [place]  . HERNIA REPAIR  2015  . THYROIDECTOMY  1999  . TONSILLECTOMY  1952   AGE 51    Family History  Problem Relation Age of Onset  . Heart disease Mother   . Cancer Father        colon  . Heart disease Brother   . Diabetes Maternal Aunt   . Asthma Paternal Aunt   . Bladder Cancer Maternal Aunt   . Ovarian cancer Neg Hx   . Kidney cancer Neg Hx   . Prostate cancer Neg Hx     Social History   Socioeconomic History  . Marital status: Married    Spouse name: Not on file  . Number of children: Not on file  . Years of education: Not on file  . Highest education level: Not on file  Occupational History  . Not on file  Tobacco Use  . Smoking status: Former Smoker    Packs/day: 1.00    Years: 15.00    Pack years: 15.00    Quit date: 11/25/1985    Years since quitting: 34.1  . Smokeless tobacco: Never Used  Vaping Use  . Vaping Use: Never used  Substance and Sexual Activity  . Alcohol  use: No  . Drug use: No  . Sexual activity: Never  Other Topics Concern  . Not on file  Social History Narrative  . Not on file   Social Determinants of Health   Financial Resource Strain:   . Difficulty of Paying Living Expenses:   Food Insecurity:   . Worried About Charity fundraiser in the Last Year:   . Arboriculturist in the Last Year:   Transportation Needs:   . Film/video editor (Medical):   Marland Kitchen Lack of Transportation (Non-Medical):   Physical Activity:   . Days of Exercise per Week:   . Minutes of Exercise per Session:   Stress:   . Feeling of Stress :   Social Connections:   . Frequency of Communication with Friends and Family:   . Frequency of Social Gatherings with Friends and Family:   . Attends Religious Services:   . Active Member of Clubs or Organizations:   . Attends Archivist Meetings:   Marland Kitchen Marital Status:   Intimate Partner Violence:   . Fear of Current or Ex-Partner:     . Emotionally Abused:   Marland Kitchen Physically Abused:   . Sexually Abused:      Current Outpatient Medications:  .  albuterol (PROVENTIL) (2.5 MG/3ML) 0.083% nebulizer solution, Inhale into the lungs., Disp: , Rfl:  .  ALBUTEROL IN, Inhale into the lungs as needed., Disp: , Rfl:  .  aspirin 81 MG tablet, Take 81 mg by mouth daily., Disp: , Rfl:  .  Calcium 500 MG CHEW, Chew by mouth., Disp: , Rfl:  .  cetirizine (ZYRTEC) 10 MG tablet, Take 10 mg by mouth daily., Disp: , Rfl:  .  Cholecalciferol (VITAMIN D-3) 1000 units CAPS, Take by mouth daily., Disp: , Rfl:  .  FLUoxetine (PROZAC) 40 MG capsule, Take 40 mg by mouth every morning., Disp: , Rfl:  .  fluticasone (FLONASE) 50 MCG/ACT nasal spray, Place 1 spray into both nostrils daily., Disp: , Rfl:  .  Fluticasone-Salmeterol (ADVAIR) 250-50 MCG/DOSE AEPB, Inhale 1 puff into the lungs 3 (three) times daily as needed. , Disp: , Rfl:  .  furosemide (LASIX) 20 MG tablet, Take 40 mg by mouth daily. , Disp: , Rfl:  .  gabapentin (NEURONTIN) 100 MG capsule, TAKE 2 CAPSULES BY MOUTH TWICE DAILY, Disp: , Rfl:  .  HYDROcodone-acetaminophen (NORCO) 7.5-325 MG tablet, Take by mouth., Disp: , Rfl:  .  levothyroxine (SYNTHROID) 150 MCG tablet, TAKE 1 TABLET BY MOUTH DAILY, Disp: 90 tablet, Rfl: 3 .  olmesartan (BENICAR) 20 MG tablet, Take 20 mg by mouth as needed (IF BP IS ELEVATED)., Disp: , Rfl:  .  Omega-3 Fatty Acids (FISH OIL) 1000 MG CAPS, Take by mouth., Disp: , Rfl:  .  pantoprazole (PROTONIX) 40 MG tablet, Take 1 tablet by mouth every morning. , Disp: , Rfl:  .  potassium chloride SA (K-DUR,KLOR-CON) 20 MEQ tablet, TK 1 T PO QD, Disp: , Rfl: 2 .  pregabalin (LYRICA) 50 MG capsule, Take by mouth., Disp: , Rfl:  .  PRESCRIPTION MEDICATION, Allergy injections once weekly, Disp: , Rfl:  .  raloxifene (EVISTA) 60 MG tablet, , Disp: , Rfl: 10 .  tiZANidine (ZANAFLEX) 4 MG capsule, Take 4 mg by mouth as needed. , Disp: , Rfl:  .  vitamin C (ASCORBIC ACID)  500 MG tablet, Take by mouth., Disp: , Rfl:  .  zolpidem (AMBIEN) 10 MG tablet, Take 1 tablet (10  mg total) by mouth at bedtime., Disp: 90 tablet, Rfl: 1   Allergies  Allergen Reactions  . Pollen Extract Other (See Comments)  . Ether     "HEART STOPPED WHILE IN SURGERY"  . Gramineae Pollens Other (See Comments)  . Penicillins Hives  . Tamsulosin     Other reaction(s): Other (See Comments) "made me loopy"  . Band-Aid Plus Antibiotic [Bacitracin-Polymyxin B] Rash    ROS Review of Systems  Constitutional: Negative.   HENT: Negative.   Eyes: Negative.   Respiratory: Negative.   Cardiovascular: Positive for leg swelling (edema).  Gastrointestinal: Negative.   Endocrine: Negative.   Genitourinary: Negative.   Musculoskeletal: Negative.   Skin: Negative.   Allergic/Immunologic: Positive for environmental allergies.  Neurological: Negative.   Hematological: Negative.   Psychiatric/Behavioral: Negative.   All other systems reviewed and are negative.     OBJECTIVE:    Physical Exam Vitals reviewed.  Constitutional:      Appearance: Normal appearance.  HENT:     Mouth/Throat:     Mouth: Mucous membranes are moist.  Eyes:     Pupils: Pupils are equal, round, and reactive to light.  Cardiovascular:     Rate and Rhythm: Normal rate and regular rhythm.     Pulses: Normal pulses.     Heart sounds: Normal heart sounds.  Pulmonary:     Effort: Pulmonary effort is normal.     Breath sounds: Normal breath sounds.  Abdominal:     Palpations: There is no hepatomegaly, splenomegaly or mass.     Tenderness: There is no abdominal tenderness.  Musculoskeletal:     Right lower leg: Edema (1+) present.     Left lower leg: Edema (1+) present.  Skin:    Findings: No ecchymosis or rash.  Neurological:     Mental Status: She is alert and oriented to person, place, and time.  Psychiatric:        Mood and Affect: Mood and affect normal.        Behavior: Behavior normal.    BP  124/75   Pulse 80   Ht 5\' 2"  (1.575 m)   Wt 183 lb 3.2 oz (83.1 kg)   BMI 33.51 kg/m  Wt Readings from Last 3 Encounters:  01/10/20 183 lb 3.2 oz (83.1 kg)  09/19/19 174 lb (78.9 kg)  09/06/18 176 lb 12.8 oz (80.2 kg)    Health Maintenance Due  Topic Date Due  . Hepatitis C Screening  Never done  . COVID-19 Vaccine (1) Never done  . TETANUS/TDAP  Never done  . DEXA SCAN  Never done  . PNA vac Low Risk Adult (1 of 2 - PCV13) Never done    There are no preventive care reminders to display for this patient.  CBC Latest Ref Rng & Units 03/06/2017 06/03/2016 07/08/2014  WBC 3.6 - 11.0 K/uL 9.9 8.0 8.2  Hemoglobin 12.0 - 16.0 g/dL 13.5 13.0 14.4  Hematocrit 35 - 47 % 40.2 37.6 43.3  Platelets 150 - 440 K/uL 320 303 357   CMP Latest Ref Rng & Units 03/06/2017 06/03/2016 07/08/2014  Glucose 65 - 99 mg/dL 112(H) 120(H) 108(H)  BUN 6 - 20 mg/dL 35(H) 22(H) 13  Creatinine 0.44 - 1.00 mg/dL 1.42(H) 1.04(H) 1.00  Sodium 135 - 145 mmol/L 135 135 137  Potassium 3.5 - 5.1 mmol/L 4.6 4.3 3.7  Chloride 101 - 111 mmol/L 101 103 104  CO2 22 - 32 mmol/L 25 24 25   Calcium 8.9 - 10.3  mg/dL 9.5 9.8 9.5  Total Protein 6.4 - 8.2 g/dL - - 7.5  Total Bilirubin 0.2 - 1.0 mg/dL - - 0.5  Alkaline Phos Unit/L - - 66  AST 15 - 37 Unit/L - - 20  ALT U/L - - 39    No results found for: TSH Lab Results  Component Value Date   ALBUMIN 4.1 07/08/2014   ANIONGAP 9 03/06/2017   No results found for: CHOL, HDL, LDLCALC, CHOLHDL No results found for: TRIG No results found for: HGBA1C    ASSESSMENT & PLAN:   Problem List Items Addressed This Visit      Cardiovascular and Mediastinum   Hypertension     Nervous and Auditory   Neuritis or radiculitis due to rupture of lumbar intervertebral disc   Relevant Medications   zolpidem (AMBIEN) 10 MG tablet     Musculoskeletal and Integument   Degeneration of intervertebral disc of lumbar region     Genitourinary   Stage 4 chronic kidney disease  ([place])    Other Visit Diagnoses    Insomnia, unspecified type    -  Primary   Relevant Medications   zolpidem (AMBIEN) 10 MG tablet      Meds ordered this encounter  Medications  . zolpidem (AMBIEN) 10 MG tablet    Sig: Take 1 tablet (10 mg total) by mouth at bedtime.    Dispense:  90 tablet    Refill:  1   1. Essential hypertension Blood pressure is under control on present medication.  2. Neuritis or radiculitis due to rupture of lumbar intervertebral disc Pain specialist for the back pain and she is getting steroid injection.  It is helping her a lot  3. Stage 4 chronic kidney disease ([place]) Was suggested follow-up with nephrologist lose weight.  4. Degeneration of intervertebral disc of lumbar region Lower back exercises were also suggested to the patient. 5. Insomnia, unspecified type Insomnia insomnia is a chronic problem.- zolpidem (AMBIEN) 10 MG tablet; Take 1 tablet (10 mg total) by mouth at bedtime.  Dispense: 90 tablet; Refill: 1 Follow-up: Return in about 3 months (around 04/11/2020).    Dr. Jane Canary [facility] [address] [place], [place] [address]   By signing my name below, I, [employer], attest that this documentation has been prepared under the direction and in the presence of [name], MD. Electronically Signed: [name], MD 01/10/20, 5:27 PM   I personally performed the services described in this documentation, which was SCRIBED in my presence. The recorded information has been reviewed and considered accurate. It has been edited as necessary during review. [name], MD

## 2020-02-06 DIAGNOSIS — M19042 Primary osteoarthritis, left hand: Secondary | ICD-10-CM | POA: Diagnosis not present

## 2020-02-06 DIAGNOSIS — M797 Fibromyalgia: Secondary | ICD-10-CM | POA: Diagnosis not present

## 2020-02-06 DIAGNOSIS — M19041 Primary osteoarthritis, right hand: Secondary | ICD-10-CM | POA: Diagnosis not present

## 2020-02-06 DIAGNOSIS — M5136 Other intervertebral disc degeneration, lumbar region: Secondary | ICD-10-CM | POA: Diagnosis not present

## 2020-02-13 DIAGNOSIS — J301 Allergic rhinitis due to pollen: Secondary | ICD-10-CM | POA: Diagnosis not present

## 2020-02-13 DIAGNOSIS — J3089 Other allergic rhinitis: Secondary | ICD-10-CM | POA: Diagnosis not present

## 2020-02-18 DIAGNOSIS — J3089 Other allergic rhinitis: Secondary | ICD-10-CM | POA: Diagnosis not present

## 2020-02-18 DIAGNOSIS — J301 Allergic rhinitis due to pollen: Secondary | ICD-10-CM | POA: Diagnosis not present

## 2020-02-27 DIAGNOSIS — J301 Allergic rhinitis due to pollen: Secondary | ICD-10-CM | POA: Diagnosis not present

## 2020-02-27 DIAGNOSIS — J3089 Other allergic rhinitis: Secondary | ICD-10-CM | POA: Diagnosis not present

## 2020-03-03 DIAGNOSIS — J301 Allergic rhinitis due to pollen: Secondary | ICD-10-CM | POA: Diagnosis not present

## 2020-03-03 DIAGNOSIS — J3089 Other allergic rhinitis: Secondary | ICD-10-CM | POA: Diagnosis not present

## 2020-03-04 DIAGNOSIS — D225 Melanocytic nevi of trunk: Secondary | ICD-10-CM | POA: Diagnosis not present

## 2020-03-04 DIAGNOSIS — D485 Neoplasm of uncertain behavior of skin: Secondary | ICD-10-CM | POA: Diagnosis not present

## 2020-03-04 DIAGNOSIS — X32XXXA Exposure to sunlight, initial encounter: Secondary | ICD-10-CM | POA: Diagnosis not present

## 2020-03-04 DIAGNOSIS — D2272 Melanocytic nevi of left lower limb, including hip: Secondary | ICD-10-CM | POA: Diagnosis not present

## 2020-03-04 DIAGNOSIS — M5414 Radiculopathy, thoracic region: Secondary | ICD-10-CM | POA: Diagnosis not present

## 2020-03-04 DIAGNOSIS — M5416 Radiculopathy, lumbar region: Secondary | ICD-10-CM | POA: Diagnosis not present

## 2020-03-04 DIAGNOSIS — L821 Other seborrheic keratosis: Secondary | ICD-10-CM | POA: Diagnosis not present

## 2020-03-04 DIAGNOSIS — M5136 Other intervertebral disc degeneration, lumbar region: Secondary | ICD-10-CM | POA: Diagnosis not present

## 2020-03-04 DIAGNOSIS — L218 Other seborrheic dermatitis: Secondary | ICD-10-CM | POA: Diagnosis not present

## 2020-03-04 DIAGNOSIS — D2262 Melanocytic nevi of left upper limb, including shoulder: Secondary | ICD-10-CM | POA: Diagnosis not present

## 2020-03-04 DIAGNOSIS — D2271 Melanocytic nevi of right lower limb, including hip: Secondary | ICD-10-CM | POA: Diagnosis not present

## 2020-03-04 DIAGNOSIS — L570 Actinic keratosis: Secondary | ICD-10-CM | POA: Diagnosis not present

## 2020-03-04 DIAGNOSIS — D2261 Melanocytic nevi of right upper limb, including shoulder: Secondary | ICD-10-CM | POA: Diagnosis not present

## 2020-03-11 DIAGNOSIS — R8281 Pyuria: Secondary | ICD-10-CM | POA: Insufficient documentation

## 2020-03-12 DIAGNOSIS — J301 Allergic rhinitis due to pollen: Secondary | ICD-10-CM | POA: Diagnosis not present

## 2020-03-12 DIAGNOSIS — J3089 Other allergic rhinitis: Secondary | ICD-10-CM | POA: Diagnosis not present

## 2020-03-17 DIAGNOSIS — R821 Myoglobinuria: Secondary | ICD-10-CM | POA: Diagnosis not present

## 2020-03-17 DIAGNOSIS — N1831 Chronic kidney disease, stage 3a: Secondary | ICD-10-CM | POA: Diagnosis not present

## 2020-03-17 DIAGNOSIS — I10 Essential (primary) hypertension: Secondary | ICD-10-CM | POA: Diagnosis not present

## 2020-03-17 DIAGNOSIS — R600 Localized edema: Secondary | ICD-10-CM | POA: Diagnosis not present

## 2020-03-17 DIAGNOSIS — N1832 Chronic kidney disease, stage 3b: Secondary | ICD-10-CM | POA: Diagnosis not present

## 2020-03-19 DIAGNOSIS — J3089 Other allergic rhinitis: Secondary | ICD-10-CM | POA: Diagnosis not present

## 2020-03-19 DIAGNOSIS — J301 Allergic rhinitis due to pollen: Secondary | ICD-10-CM | POA: Diagnosis not present

## 2020-03-27 DIAGNOSIS — J301 Allergic rhinitis due to pollen: Secondary | ICD-10-CM | POA: Diagnosis not present

## 2020-03-27 DIAGNOSIS — J3089 Other allergic rhinitis: Secondary | ICD-10-CM | POA: Diagnosis not present

## 2020-04-02 DIAGNOSIS — J301 Allergic rhinitis due to pollen: Secondary | ICD-10-CM | POA: Diagnosis not present

## 2020-04-02 DIAGNOSIS — J3089 Other allergic rhinitis: Secondary | ICD-10-CM | POA: Diagnosis not present

## 2020-04-09 DIAGNOSIS — J3089 Other allergic rhinitis: Secondary | ICD-10-CM | POA: Diagnosis not present

## 2020-04-09 DIAGNOSIS — J301 Allergic rhinitis due to pollen: Secondary | ICD-10-CM | POA: Diagnosis not present

## 2020-04-13 DIAGNOSIS — J45909 Unspecified asthma, uncomplicated: Secondary | ICD-10-CM | POA: Diagnosis not present

## 2020-04-13 DIAGNOSIS — Z1231 Encounter for screening mammogram for malignant neoplasm of breast: Secondary | ICD-10-CM | POA: Diagnosis not present

## 2020-04-13 DIAGNOSIS — R928 Other abnormal and inconclusive findings on diagnostic imaging of breast: Secondary | ICD-10-CM | POA: Diagnosis not present

## 2020-04-13 DIAGNOSIS — Z6834 Body mass index (BMI) 34.0-34.9, adult: Secondary | ICD-10-CM | POA: Diagnosis not present

## 2020-04-16 DIAGNOSIS — J3089 Other allergic rhinitis: Secondary | ICD-10-CM | POA: Diagnosis not present

## 2020-04-16 DIAGNOSIS — J301 Allergic rhinitis due to pollen: Secondary | ICD-10-CM | POA: Diagnosis not present

## 2020-04-21 ENCOUNTER — Other Ambulatory Visit: Payer: Self-pay | Admitting: Internal Medicine

## 2020-04-28 DIAGNOSIS — J301 Allergic rhinitis due to pollen: Secondary | ICD-10-CM | POA: Diagnosis not present

## 2020-04-28 DIAGNOSIS — J3089 Other allergic rhinitis: Secondary | ICD-10-CM | POA: Diagnosis not present

## 2020-04-28 DIAGNOSIS — Z23 Encounter for immunization: Secondary | ICD-10-CM | POA: Diagnosis not present

## 2020-05-01 DIAGNOSIS — J301 Allergic rhinitis due to pollen: Secondary | ICD-10-CM | POA: Diagnosis not present

## 2020-05-01 DIAGNOSIS — J3089 Other allergic rhinitis: Secondary | ICD-10-CM | POA: Diagnosis not present

## 2020-05-05 DIAGNOSIS — J3081 Allergic rhinitis due to animal (cat) (dog) hair and dander: Secondary | ICD-10-CM | POA: Diagnosis not present

## 2020-05-05 DIAGNOSIS — J3089 Other allergic rhinitis: Secondary | ICD-10-CM | POA: Diagnosis not present

## 2020-05-05 DIAGNOSIS — J301 Allergic rhinitis due to pollen: Secondary | ICD-10-CM | POA: Diagnosis not present

## 2020-05-07 DIAGNOSIS — J301 Allergic rhinitis due to pollen: Secondary | ICD-10-CM | POA: Diagnosis not present

## 2020-05-07 DIAGNOSIS — J3089 Other allergic rhinitis: Secondary | ICD-10-CM | POA: Diagnosis not present

## 2020-05-14 DIAGNOSIS — J3089 Other allergic rhinitis: Secondary | ICD-10-CM | POA: Diagnosis not present

## 2020-05-14 DIAGNOSIS — J3081 Allergic rhinitis due to animal (cat) (dog) hair and dander: Secondary | ICD-10-CM | POA: Diagnosis not present

## 2020-05-14 DIAGNOSIS — J301 Allergic rhinitis due to pollen: Secondary | ICD-10-CM | POA: Diagnosis not present

## 2020-05-26 ENCOUNTER — Ambulatory Visit (INDEPENDENT_AMBULATORY_CARE_PROVIDER_SITE_OTHER): Payer: Medicare Other | Admitting: Internal Medicine

## 2020-05-26 ENCOUNTER — Other Ambulatory Visit: Payer: Self-pay

## 2020-05-26 ENCOUNTER — Encounter: Payer: Self-pay | Admitting: Internal Medicine

## 2020-05-26 DIAGNOSIS — J3081 Allergic rhinitis due to animal (cat) (dog) hair and dander: Secondary | ICD-10-CM | POA: Diagnosis not present

## 2020-05-26 DIAGNOSIS — B3741 Candidal cystitis and urethritis: Secondary | ICD-10-CM

## 2020-05-26 DIAGNOSIS — I10 Essential (primary) hypertension: Secondary | ICD-10-CM

## 2020-05-26 DIAGNOSIS — J3089 Other allergic rhinitis: Secondary | ICD-10-CM | POA: Diagnosis not present

## 2020-05-26 DIAGNOSIS — L02416 Cutaneous abscess of left lower limb: Secondary | ICD-10-CM | POA: Insufficient documentation

## 2020-05-26 DIAGNOSIS — L03116 Cellulitis of left lower limb: Secondary | ICD-10-CM | POA: Diagnosis not present

## 2020-05-26 DIAGNOSIS — N184 Chronic kidney disease, stage 4 (severe): Secondary | ICD-10-CM | POA: Diagnosis not present

## 2020-05-26 DIAGNOSIS — J301 Allergic rhinitis due to pollen: Secondary | ICD-10-CM

## 2020-05-26 MED ORDER — FLUCONAZOLE 100 MG PO TABS: 100.0000 mg | ORAL_TABLET | Freq: Every day | ORAL | 0 refills | Status: DC

## 2020-05-26 MED ORDER — LEVOFLOXACIN 250 MG PO TABS: 250.0000 mg | ORAL_TABLET | Freq: Every day | ORAL | 0 refills | Status: DC

## 2020-05-26 MED ORDER — CEPHALEXIN 250 MG PO CAPS: 250.0000 mg | ORAL_CAPSULE | Freq: Four times a day (QID) | ORAL | 0 refills | Status: DC

## 2020-05-26 NOTE — Assessment & Plan Note (Signed)
Stable

## 2020-05-26 NOTE — Assessment & Plan Note (Signed)
As the patient is allergic to penicillin.  So we started the patient on Levaquin 250 mg p.o. daily for 7 days.

## 2020-05-26 NOTE — Progress Notes (Signed)
Established Patient Office Visit  Subjective:  Patient ID: [name], female    DOB: 08/24/1945  Age: 74 y.o. MRN: [id number]  CC: No chief complaint on file.   Leg Pain  The incident occurred 3 to 5 days ago. The incident occurred at home. The injury mechanism was a fall. The pain is present in the left leg. The pain is at a severity of 4/10. The pain is mild. The pain has been constant since onset. The symptoms are aggravated by palpation.    [name] presents for pain ad swelling lt leg  Past Medical History:  Diagnosis Date  . Asthma    WELL CONTROLLED  . Bulging lumbar disc   . Cancer [facility]) 1999   thyroid with recurrent 2006  . Complication of anesthesia    HEART STOPPED DURING TONSILLECTOMY (AGE 54) DUE TO ALLERGY TO ETHER  . DDD (degenerative disc disease), lumbar   . DDD (degenerative disc disease), thoracolumbar   . Diverticulosis   . Family history of adverse reaction to anesthesia    PTS FATHER-UNSURE WHAT HAPPENED  . GERD (gastroesophageal reflux disease)   . Heart murmur   . Hepatitis    AGE 89 "VIRAL"  . Hypertension    OFF MEDS CURRENTLY PER PCP (Manon Banbury)  . Hypothyroidism   . Interstitial cystitis   . Kidney disease   . PONV (postoperative nausea and vomiting)   . Thyroid disease     Past Surgical History:  Procedure Laterality Date  . BLADDER SURGERY  1984  . BREAST SURGERY  [id number]   mass removed  . BREAST SURGERY  April 2017   Dr Jack Quarto [facility]  . CATARACT EXTRACTION  2011  . CHOLECYSTECTOMY N/A 11/30/2015   Procedure: LAPAROSCOPIC CHOLECYSTECTOMY WITH INTRAOPERATIVE CHOLANGIOGRAM;  Surgeon: Christene Lye, MD;  Location: [facility] ORS;  Service: General;  Laterality: N/A;  . COLONOSCOPY  2005, 2015  . DILATION AND CURETTAGE OF UTERUS  1975  . EXCISIONAL HEMORRHOIDECTOMY  1975  . EYE SURGERY Left 2011  . [place]  . HERNIA REPAIR  2015  . THYROIDECTOMY  1999  . TONSILLECTOMY  1952   AGE 54    Family  History  Problem Relation Age of Onset  . Heart disease Mother   . Cancer Father        colon  . Heart disease Brother   . Diabetes Maternal Aunt   . Asthma Paternal Aunt   . Bladder Cancer Maternal Aunt   . Ovarian cancer Neg Hx   . Kidney cancer Neg Hx   . Prostate cancer Neg Hx     Social History   Socioeconomic History  . Marital status: Married    Spouse name: Not on file  . Number of children: Not on file  . Years of education: Not on file  . Highest education level: Not on file  Occupational History  . Not on file  Tobacco Use  . Smoking status: Former Smoker    Packs/day: 1.00    Years: 15.00    Pack years: 15.00    Quit date: 11/25/1985    Years since quitting: 34.5  . Smokeless tobacco: Never Used  Vaping Use  . Vaping Use: Never used  Substance and Sexual Activity  . Alcohol use: No  . Drug use: No  . Sexual activity: Never  Other Topics Concern  . Not on file  Social History Narrative  . Not on file   Social Determinants  of Health   Financial Resource Strain:   . Difficulty of Paying Living Expenses: Not on file  Food Insecurity:   . Worried About Charity fundraiser in the Last Year: Not on file  . Ran Out of Food in the Last Year: Not on file  Transportation Needs:   . Lack of Transportation (Medical): Not on file  . Lack of Transportation (Non-Medical): Not on file  Physical Activity:   . Days of Exercise per Week: Not on file  . Minutes of Exercise per Session: Not on file  Stress:   . Feeling of Stress : Not on file  Social Connections:   . Frequency of Communication with Friends and Family: Not on file  . Frequency of Social Gatherings with Friends and Family: Not on file  . Attends Religious Services: Not on file  . Active Member of Clubs or Organizations: Not on file  . Attends Archivist Meetings: Not on file  . Marital Status: Not on file  Intimate Partner Violence:   . Fear of Current or Ex-Partner: Not on file  .  Emotionally Abused: Not on file  . Physically Abused: Not on file  . Sexually Abused: Not on file     Current Outpatient Medications:  .  albuterol (PROVENTIL) (2.5 MG/3ML) 0.083% nebulizer solution, Inhale into the lungs., Disp: , Rfl:  .  ALBUTEROL IN, Inhale into the lungs as needed., Disp: , Rfl:  .  aspirin 81 MG tablet, Take 81 mg by mouth daily., Disp: , Rfl:  .  Calcium 500 MG CHEW, Chew by mouth., Disp: , Rfl:  .  cetirizine (ZYRTEC) 10 MG tablet, Take 10 mg by mouth daily., Disp: , Rfl:  .  Cholecalciferol (VITAMIN D-3) 1000 units CAPS, Take by mouth daily., Disp: , Rfl:  .  fluconazole (DIFLUCAN) 100 MG tablet, Take 1 tablet (100 mg total) by mouth daily for 4 days., Disp: 4 tablet, Rfl: 0 .  FLUoxetine (PROZAC) 40 MG capsule, Take 40 mg by mouth every morning., Disp: , Rfl:  .  fluticasone (FLONASE) 50 MCG/ACT nasal spray, Place 1 spray into both nostrils daily., Disp: , Rfl:  .  Fluticasone-Salmeterol (ADVAIR) 250-50 MCG/DOSE AEPB, Inhale 1 puff into the lungs 3 (three) times daily as needed. , Disp: , Rfl:  .  furosemide (LASIX) 20 MG tablet, Take 40 mg by mouth daily. , Disp: , Rfl:  .  gabapentin (NEURONTIN) 100 MG capsule, TAKE 2 CAPSULES BY MOUTH TWICE DAILY, Disp: , Rfl:  .  HYDROcodone-acetaminophen (NORCO) 7.5-325 MG tablet, Take by mouth., Disp: , Rfl:  .  levofloxacin (LEVAQUIN) 250 MG tablet, Take 1 tablet (250 mg total) by mouth daily., Disp: 7 tablet, Rfl: 0 .  levothyroxine (SYNTHROID) 150 MCG tablet, TAKE 1 TABLET BY MOUTH DAILY, Disp: 90 tablet, Rfl: 3 .  olmesartan (BENICAR) 20 MG tablet, Take 20 mg by mouth as needed (IF BP IS ELEVATED)., Disp: , Rfl:  .  Omega-3 Fatty Acids (FISH OIL) 1000 MG CAPS, Take by mouth., Disp: , Rfl:  .  pantoprazole (PROTONIX) 40 MG tablet, Take 1 tablet by mouth every morning. , Disp: , Rfl:  .  potassium chloride SA (K-DUR,KLOR-CON) 20 MEQ tablet, TK 1 T PO QD, Disp: , Rfl: 2 .  predniSONE (DELTASONE) 2.5 MG tablet, TAKE ONE  TABLET EVERY DAY, Disp: 30 tablet, Rfl: 6 .  pregabalin (LYRICA) 50 MG capsule, Take by mouth., Disp: , Rfl:  .  PRESCRIPTION MEDICATION, Allergy injections once  weekly, Disp: , Rfl:  .  raloxifene (EVISTA) 60 MG tablet, , Disp: , Rfl: 10 .  tiZANidine (ZANAFLEX) 4 MG capsule, Take 4 mg by mouth as needed. , Disp: , Rfl:  .  vitamin C (ASCORBIC ACID) 500 MG tablet, Take by mouth., Disp: , Rfl:  .  zolpidem (AMBIEN) 10 MG tablet, Take 1 tablet (10 mg total) by mouth at bedtime., Disp: 90 tablet, Rfl: 1   Allergies  Allergen Reactions  . Pollen Extract Other (See Comments)  . Ether     "HEART STOPPED WHILE IN SURGERY"  . Gramineae Pollens Other (See Comments)  . Penicillins Hives  . Tamsulosin     Other reaction(s): Other (See Comments) "made me loopy"  . Band-Aid Plus Antibiotic [Bacitracin-Polymyxin B] Rash    ROS Review of Systems  Constitutional: Negative.  Negative for chills.  HENT: Negative.   Eyes: Negative.   Respiratory: Negative.   Cardiovascular: Negative.   Gastrointestinal: Negative.   Endocrine: Negative.   Genitourinary: Negative.   Musculoskeletal: Negative.  Negative for back pain.  Skin: Negative.   Allergic/Immunologic: Negative.   Neurological: Negative.   Hematological: Negative.   Psychiatric/Behavioral: Negative.  Negative for agitation.  All other systems reviewed and are negative.     Objective:    Physical Exam Vitals reviewed.  Constitutional:      Appearance: Normal appearance.  HENT:     Mouth/Throat:     Mouth: Mucous membranes are moist.  Eyes:     Pupils: Pupils are equal, round, and reactive to light.  Neck:     Vascular: No carotid bruit.  Cardiovascular:     Rate and Rhythm: Normal rate and regular rhythm.     Pulses: Normal pulses.     Heart sounds: Normal heart sounds.  Pulmonary:     Effort: Pulmonary effort is normal.     Breath sounds: Normal breath sounds.  Abdominal:     General: Bowel sounds are normal.      Palpations: Abdomen is soft. There is no hepatomegaly, splenomegaly or mass.     Tenderness: There is no abdominal tenderness.     Hernia: No hernia is present.  Musculoskeletal:        General: No tenderness.     Cervical back: Neck supple.     Right lower leg: No edema.     Left lower leg: No edema.     Comments: Patient is swelling of the left leg which happened after the fall  Skin:    Findings: No rash.  Neurological:     Mental Status: She is alert and oriented to person, place, and time.     Motor: No weakness.  Psychiatric:        Mood and Affect: Mood and affect normal.        Behavior: Behavior normal.     There were no vitals taken for this visit. Wt Readings from Last 3 Encounters:  01/10/20 183 lb 3.2 oz (83.1 kg)  09/19/19 174 lb (78.9 kg)  09/06/18 176 lb 12.8 oz (80.2 kg)     Health Maintenance Due  Topic Date Due  . Hepatitis C Screening  Never done  . COVID-19 Vaccine (1) Never done  . TETANUS/TDAP  Never done  . DEXA SCAN  Never done  . PNA vac Low Risk Adult (1 of 2 - PCV13) Never done  . INFLUENZA VACCINE  02/23/2020    There are no preventive care reminders to display for this patient.  No results found for: TSH Lab Results  Component Value Date   WBC 9.9 03/06/2017   HGB 13.5 03/06/2017   HCT 40.2 03/06/2017   MCV 89.6 03/06/2017   PLT 320 03/06/2017   Lab Results  Component Value Date   NA 135 03/06/2017   K 4.6 03/06/2017   CO2 25 03/06/2017   GLUCOSE 112 (H) 03/06/2017   BUN 35 (H) 03/06/2017   CREATININE 1.42 (H) 03/06/2017   BILITOT 0.5 07/08/2014   ALKPHOS 66 07/08/2014   AST 20 07/08/2014   ALT 39 07/08/2014   PROT 7.5 07/08/2014   ALBUMIN 4.1 07/08/2014   CALCIUM 9.5 03/06/2017   ANIONGAP 9 03/06/2017   No results found for: CHOL No results found for: HDL No results found for: LDLCALC No results found for: TRIG No results found for: CHOLHDL No results found for: HGBA1C    Assessment & Plan:   Problem List Items  Addressed This Visit      Cardiovascular and Mediastinum   Essential hypertension    - Today, the patient's blood pressure is well managed on benicar. - The patient will continue the current treatment regimen.  - I encouraged the patient to eat a low-sodium diet to help control blood pressure. - I encouraged the patient to live an active lifestyle and complete activities that increases heart rate to 85% target heart rate at least 5 times per week for one hour.            Respiratory   Seasonal allergic rhinitis due to pollen    Stable         Genitourinary   Stage 4 chronic kidney disease ([place])    Patient is under the care of a nephrologist.        Other   Cellulitis of left lower extremity - Primary    As the patient is allergic to penicillin.  So we started the patient on Levaquin 250 mg p.o. daily for 7 days.       Other Visit Diagnoses    Yeast cystitis       Relevant Medications   fluconazole (DIFLUCAN) 100 MG tablet      Meds ordered this encounter  Medications  . DISCONTD: cephALEXin (KEFLEX) 250 MG capsule    Sig: Take 1 capsule (250 mg total) by mouth 4 (four) times daily for 7 days.    Dispense:  28 capsule    Refill:  0  . fluconazole (DIFLUCAN) 100 MG tablet    Sig: Take 1 tablet (100 mg total) by mouth daily for 4 days.    Dispense:  4 tablet    Refill:  0  . levofloxacin (LEVAQUIN) 250 MG tablet    Sig: Take 1 tablet (250 mg total) by mouth daily.    Dispense:  7 tablet    Refill:  0    Follow-up: No follow-ups on file.    [name], MD

## 2020-05-26 NOTE — Assessment & Plan Note (Signed)
-   Today, the patient's blood pressure is well managed on benicar. - The patient will continue the current treatment regimen.  - I encouraged the patient to eat a low-sodium diet to help control blood pressure. - I encouraged the patient to live an active lifestyle and complete activities that increases heart rate to 85% target heart rate at least 5 times per week for one hour.

## 2020-05-26 NOTE — Assessment & Plan Note (Signed)
Patient is under the care of a nephrologist.

## 2020-06-01 ENCOUNTER — Ambulatory Visit (INDEPENDENT_AMBULATORY_CARE_PROVIDER_SITE_OTHER): Payer: Medicare Other | Admitting: Internal Medicine

## 2020-06-01 ENCOUNTER — Encounter: Payer: Self-pay | Admitting: Internal Medicine

## 2020-06-01 ENCOUNTER — Other Ambulatory Visit: Payer: Self-pay

## 2020-06-01 VITALS — BP 137/86 | HR 89 | Ht 66.0 in | Wt 176.3 lb

## 2020-06-01 DIAGNOSIS — I872 Venous insufficiency (chronic) (peripheral): Secondary | ICD-10-CM | POA: Diagnosis not present

## 2020-06-01 DIAGNOSIS — M79604 Pain in right leg: Secondary | ICD-10-CM | POA: Diagnosis not present

## 2020-06-01 DIAGNOSIS — I10 Essential (primary) hypertension: Secondary | ICD-10-CM | POA: Diagnosis not present

## 2020-06-01 DIAGNOSIS — L03116 Cellulitis of left lower limb: Secondary | ICD-10-CM | POA: Diagnosis not present

## 2020-06-01 DIAGNOSIS — M79605 Pain in left leg: Secondary | ICD-10-CM | POA: Diagnosis not present

## 2020-06-01 DIAGNOSIS — L02416 Cutaneous abscess of left lower limb: Secondary | ICD-10-CM

## 2020-06-01 MED ORDER — SULFAMETHOXAZOLE-TRIMETHOPRIM 800-160 MG PO TABS: 1.0000 | ORAL_TABLET | Freq: Two times a day (BID) | ORAL | 0 refills | Status: AC

## 2020-06-01 NOTE — Progress Notes (Signed)
Established Patient Office Visit  Subjective:  Patient ID: [name], female    DOB: [birth year]  Age: 74 y.o. MRN: [id number]  CC: No chief complaint on file.   HPI  [name] presents for cellulitis of the left leg with a scab formation. Patient also has varicose veins of the both leg left more than the right. She is using support stockings. Denies any history of fever or chills.  Past Medical History:  Diagnosis Date  . Asthma    WELL CONTROLLED  . Bulging lumbar disc   . Cancer [facility]) 1999   thyroid with recurrent 2006  . Complication of anesthesia    HEART STOPPED DURING TONSILLECTOMY (AGE 57) DUE TO ALLERGY TO ETHER  . DDD (degenerative disc disease), lumbar   . DDD (degenerative disc disease), thoracolumbar   . Diverticulosis   . Family history of adverse reaction to anesthesia    PTS FATHER-UNSURE WHAT HAPPENED  . GERD (gastroesophageal reflux disease)   . Heart murmur   . Hepatitis    AGE 64 "VIRAL"  . Hypertension    OFF MEDS CURRENTLY PER PCP (Aziah Kaiser)  . Hypothyroidism   . Interstitial cystitis   . Kidney disease   . PONV (postoperative nausea and vomiting)   . Thyroid disease     Past Surgical History:  Procedure Laterality Date  . BLADDER SURGERY  1984  . BREAST SURGERY  0865,7846   mass removed  . BREAST SURGERY  April 2017   Dr Jack Quarto [facility]  . CATARACT EXTRACTION  2011  . CHOLECYSTECTOMY N/A 11/30/2015   Procedure: LAPAROSCOPIC CHOLECYSTECTOMY WITH INTRAOPERATIVE CHOLANGIOGRAM;  Surgeon: Christene Lye, MD;  Location: [facility] ORS;  Service: General;  Laterality: N/A;  . COLONOSCOPY  2005, 2015  . DILATION AND CURETTAGE OF UTERUS  1975  . EXCISIONAL HEMORRHOIDECTOMY  1975  . EYE SURGERY Left 2011  . [place]  . HERNIA REPAIR  2015  . THYROIDECTOMY  1999  . TONSILLECTOMY  1952   AGE 57    Family History  Problem Relation Age of Onset  . Heart disease Mother   . Cancer Father        colon  . Heart disease  Brother   . Diabetes Maternal Aunt   . Asthma Paternal Aunt   . Bladder Cancer Maternal Aunt   . Ovarian cancer Neg Hx   . Kidney cancer Neg Hx   . Prostate cancer Neg Hx     Social History   Socioeconomic History  . Marital status: Married    Spouse name: Not on file  . Number of children: Not on file  . Years of education: Not on file  . Highest education level: Not on file  Occupational History  . Not on file  Tobacco Use  . Smoking status: Former Smoker    Packs/day: 1.00    Years: 15.00    Pack years: 15.00    Quit date: 11/25/1985    Years since quitting: 34.5  . Smokeless tobacco: Never Used  Vaping Use  . Vaping Use: Never used  Substance and Sexual Activity  . Alcohol use: No  . Drug use: No  . Sexual activity: Never  Other Topics Concern  . Not on file  Social History Narrative  . Not on file   Social Determinants of Health   Financial Resource Strain:   . Difficulty of Paying Living Expenses: Not on file  Food Insecurity:   . Worried About  Running Out of Food in the Last Year: Not on file  . Ran Out of Food in the Last Year: Not on file  Transportation Needs:   . Lack of Transportation (Medical): Not on file  . Lack of Transportation (Non-Medical): Not on file  Physical Activity:   . Days of Exercise per Week: Not on file  . Minutes of Exercise per Session: Not on file  Stress:   . Feeling of Stress : Not on file  Social Connections:   . Frequency of Communication with Friends and Family: Not on file  . Frequency of Social Gatherings with Friends and Family: Not on file  . Attends Religious Services: Not on file  . Active Member of Clubs or Organizations: Not on file  . Attends Archivist Meetings: Not on file  . Marital Status: Not on file  Intimate Partner Violence:   . Fear of Current or Ex-Partner: Not on file  . Emotionally Abused: Not on file  . Physically Abused: Not on file  . Sexually Abused: Not on file     Current  Outpatient Medications:  .  albuterol (PROVENTIL) (2.5 MG/3ML) 0.083% nebulizer solution, Inhale into the lungs., Disp: , Rfl:  .  ALBUTEROL IN, Inhale into the lungs as needed., Disp: , Rfl:  .  aspirin 81 MG tablet, Take 81 mg by mouth daily., Disp: , Rfl:  .  Calcium 500 MG CHEW, Chew by mouth., Disp: , Rfl:  .  cetirizine (ZYRTEC) 10 MG tablet, Take 10 mg by mouth daily., Disp: , Rfl:  .  Cholecalciferol (VITAMIN D-3) 1000 units CAPS, Take by mouth daily., Disp: , Rfl:  .  FLUoxetine (PROZAC) 40 MG capsule, Take 40 mg by mouth every morning., Disp: , Rfl:  .  fluticasone (FLONASE) 50 MCG/ACT nasal spray, Place 1 spray into both nostrils daily., Disp: , Rfl:  .  Fluticasone-Salmeterol (ADVAIR) 250-50 MCG/DOSE AEPB, Inhale 1 puff into the lungs 3 (three) times daily as needed. , Disp: , Rfl:  .  furosemide (LASIX) 20 MG tablet, Take 40 mg by mouth daily. , Disp: , Rfl:  .  gabapentin (NEURONTIN) 100 MG capsule, TAKE 2 CAPSULES BY MOUTH TWICE DAILY, Disp: , Rfl:  .  HYDROcodone-acetaminophen (NORCO) 7.5-325 MG tablet, Take by mouth., Disp: , Rfl:  .  levofloxacin (LEVAQUIN) 250 MG tablet, Take 1 tablet (250 mg total) by mouth daily., Disp: 7 tablet, Rfl: 0 .  levothyroxine (SYNTHROID) 150 MCG tablet, TAKE 1 TABLET BY MOUTH DAILY, Disp: 90 tablet, Rfl: 3 .  olmesartan (BENICAR) 20 MG tablet, Take 20 mg by mouth as needed (IF BP IS ELEVATED)., Disp: , Rfl:  .  Omega-3 Fatty Acids (FISH OIL) 1000 MG CAPS, Take by mouth., Disp: , Rfl:  .  pantoprazole (PROTONIX) 40 MG tablet, Take 1 tablet by mouth every morning. , Disp: , Rfl:  .  potassium chloride SA (K-DUR,KLOR-CON) 20 MEQ tablet, TK 1 T PO QD, Disp: , Rfl: 2 .  predniSONE (DELTASONE) 2.5 MG tablet, TAKE ONE TABLET EVERY DAY, Disp: 30 tablet, Rfl: 6 .  pregabalin (LYRICA) 50 MG capsule, Take by mouth., Disp: , Rfl:  .  PRESCRIPTION MEDICATION, Allergy injections once weekly, Disp: , Rfl:  .  raloxifene (EVISTA) 60 MG tablet, , Disp: , Rfl:  10 .  tiZANidine (ZANAFLEX) 4 MG capsule, Take 4 mg by mouth as needed. , Disp: , Rfl:  .  vitamin C (ASCORBIC ACID) 500 MG tablet, Take by mouth., Disp: ,  Rfl:  .  sulfamethoxazole-trimethoprim (BACTRIM DS) 800-160 MG tablet, Take 1 tablet by mouth 2 (two) times daily for 10 days., Disp: 20 tablet, Rfl: 0 .  zolpidem (AMBIEN) 10 MG tablet, Take 1 tablet (10 mg total) by mouth at bedtime., Disp: 90 tablet, Rfl: 1   Allergies  Allergen Reactions  . Pollen Extract Other (See Comments)  . Ether     "HEART STOPPED WHILE IN SURGERY"  . Gramineae Pollens Other (See Comments)  . Penicillins Hives  . Tamsulosin     Other reaction(s): Other (See Comments) "made me loopy"  . Band-Aid Plus Antibiotic [Bacitracin-Polymyxin B] Rash    ROS Review of Systems  Constitutional: Negative.   HENT: Negative.   Eyes: Negative.   Respiratory: Negative.   Cardiovascular: Negative.   Gastrointestinal: Negative.   Endocrine: Negative.   Genitourinary: Negative.   Musculoskeletal: Negative.   Skin: Negative.   Allergic/Immunologic: Negative.   Neurological: Negative.   Hematological: Negative.   Psychiatric/Behavioral: Negative.   All other systems reviewed and are negative.     Objective:    Physical Exam Vitals reviewed.  Constitutional:      Appearance: Normal appearance.  HENT:     Mouth/Throat:     Mouth: Mucous membranes are moist.  Eyes:     Pupils: Pupils are equal, round, and reactive to light.  Neck:     Vascular: No carotid bruit.  Cardiovascular:     Rate and Rhythm: Normal rate and regular rhythm.     Pulses: Normal pulses.     Heart sounds: Normal heart sounds.  Pulmonary:     Effort: Pulmonary effort is normal.     Breath sounds: Normal breath sounds.  Abdominal:     General: Bowel sounds are normal.     Palpations: Abdomen is soft. There is no hepatomegaly, splenomegaly or mass.     Tenderness: There is no abdominal tenderness.     Hernia: No hernia is present.   Musculoskeletal:        General: No tenderness.     Cervical back: Neck supple.     Right lower leg: No edema.     Left lower leg: No edema.     Comments: Patient still have ulcer in the left leg in the middle. she also has swelling of the left leg. Plus erythema  Skin:    Findings: No rash.  Neurological:     Mental Status: She is alert and oriented to person, place, and time.     Motor: No weakness.  Psychiatric:        Mood and Affect: Mood and affect normal.        Behavior: Behavior normal.     BP 137/86   Pulse 89   Ht 5\' 6"  (1.676 m)   Wt 176 lb 4.8 oz (80 kg)   BMI 28.46 kg/m  Wt Readings from Last 3 Encounters:  06/01/20 176 lb 4.8 oz (80 kg)  01/10/20 183 lb 3.2 oz (83.1 kg)  09/19/19 174 lb (78.9 kg)     Health Maintenance Due  Topic Date Due  . Hepatitis C Screening  Never done  . COVID-19 Vaccine (1) Never done  . TETANUS/TDAP  Never done  . DEXA SCAN  Never done  . PNA vac Low Risk Adult (1 of 2 - PCV13) Never done  . INFLUENZA VACCINE  02/23/2020    There are no preventive care reminders to display for this patient.  No results found for: TSH  Lab Results  Component Value Date   WBC 9.9 03/06/2017   HGB 13.5 03/06/2017   HCT 40.2 03/06/2017   MCV 89.6 03/06/2017   PLT 320 03/06/2017   Lab Results  Component Value Date   NA 135 03/06/2017   K 4.6 03/06/2017   CO2 25 03/06/2017   GLUCOSE 112 (H) 03/06/2017   BUN 35 (H) 03/06/2017   CREATININE 1.42 (H) 03/06/2017   BILITOT 0.5 07/08/2014   ALKPHOS 66 07/08/2014   AST 20 07/08/2014   ALT 39 07/08/2014   PROT 7.5 07/08/2014   ALBUMIN 4.1 07/08/2014   CALCIUM 9.5 03/06/2017   ANIONGAP 9 03/06/2017   No results found for: CHOL No results found for: HDL No results found for: LDLCALC No results found for: TRIG No results found for: CHOLHDL No results found for: HGBA1C    Assessment & Plan:   Problem List Items Addressed This Visit      Cardiovascular and Mediastinum   Essential  hypertension    Blood pressure 137/86 today      Chronic venous insufficiency    Patient was advised to use support hose        Other   Lower extremity pain, bilateral    Patient is complaining of more pain in the left leg than on the right.      Cellulitis and abscess of left leg - Primary    Patient has an ulcer on the left leg with surrounding erythema with varicose veins and 1+ pedal edema. I change her antibiotic to Septra DS 1 tablet twice a day for 10 days      Relevant Medications   sulfamethoxazole-trimethoprim (BACTRIM DS) 800-160 MG tablet      Meds ordered this encounter  Medications  . sulfamethoxazole-trimethoprim (BACTRIM DS) 800-160 MG tablet    Sig: Take 1 tablet by mouth 2 (two) times daily for 10 days.    Dispense:  20 tablet    Refill:  0    Follow-up: No follow-ups on file.    [name], MD

## 2020-06-01 NOTE — Assessment & Plan Note (Signed)
Blood pressure 137/86 today

## 2020-06-01 NOTE — Assessment & Plan Note (Signed)
Patient was advised to use support hose 

## 2020-06-01 NOTE — Assessment & Plan Note (Signed)
Patient is complaining of more pain in the left leg than on the right.

## 2020-06-01 NOTE — Assessment & Plan Note (Signed)
Patient has an ulcer on the left leg with surrounding erythema with varicose veins and 1+ pedal edema. I change her antibiotic to Septra DS 1 tablet twice a day for 10 days

## 2020-06-04 DIAGNOSIS — J3089 Other allergic rhinitis: Secondary | ICD-10-CM | POA: Diagnosis not present

## 2020-06-04 DIAGNOSIS — J301 Allergic rhinitis due to pollen: Secondary | ICD-10-CM | POA: Diagnosis not present

## 2020-06-09 ENCOUNTER — Ambulatory Visit (INDEPENDENT_AMBULATORY_CARE_PROVIDER_SITE_OTHER): Payer: Medicare Other | Admitting: Internal Medicine

## 2020-06-09 ENCOUNTER — Other Ambulatory Visit: Payer: Self-pay

## 2020-06-09 DIAGNOSIS — J301 Allergic rhinitis due to pollen: Secondary | ICD-10-CM | POA: Diagnosis not present

## 2020-06-09 DIAGNOSIS — G4700 Insomnia, unspecified: Secondary | ICD-10-CM

## 2020-06-09 DIAGNOSIS — J3081 Allergic rhinitis due to animal (cat) (dog) hair and dander: Secondary | ICD-10-CM | POA: Diagnosis not present

## 2020-06-09 DIAGNOSIS — J3089 Other allergic rhinitis: Secondary | ICD-10-CM | POA: Diagnosis not present

## 2020-06-09 DIAGNOSIS — E785 Hyperlipidemia, unspecified: Secondary | ICD-10-CM | POA: Diagnosis not present

## 2020-06-09 DIAGNOSIS — E079 Disorder of thyroid, unspecified: Secondary | ICD-10-CM

## 2020-06-09 DIAGNOSIS — I10 Essential (primary) hypertension: Secondary | ICD-10-CM

## 2020-06-09 DIAGNOSIS — N184 Chronic kidney disease, stage 4 (severe): Secondary | ICD-10-CM

## 2020-06-10 DIAGNOSIS — M5136 Other intervertebral disc degeneration, lumbar region: Secondary | ICD-10-CM | POA: Diagnosis not present

## 2020-06-10 DIAGNOSIS — M5416 Radiculopathy, lumbar region: Secondary | ICD-10-CM | POA: Diagnosis not present

## 2020-06-10 DIAGNOSIS — M5414 Radiculopathy, thoracic region: Secondary | ICD-10-CM | POA: Diagnosis not present

## 2020-06-10 LAB — CBC WITH DIFFERENTIAL/PLATELET
Absolute Monocytes: 680 cells/uL (ref 200–950)
Basophils Absolute: 119 cells/uL (ref 0–200)
Basophils Relative: 1.8 %
Eosinophils Absolute: 231 cells/uL (ref 15–500)
Eosinophils Relative: 3.5 %
HCT: 36.7 % (ref 35.0–45.0)
Hemoglobin: 12.3 g/dL (ref 11.7–15.5)
Lymphs Abs: 1313 cells/uL (ref 850–3900)
MCH: 29.8 pg (ref 27.0–33.0)
MCHC: 33.5 g/dL (ref 32.0–36.0)
MCV: 88.9 fL (ref 80.0–100.0)
MPV: 10.2 fL (ref 7.5–12.5)
Monocytes Relative: 10.3 %
Neutro Abs: 4257 cells/uL (ref 1500–7800)
Neutrophils Relative %: 64.5 %
Platelets: 319 10*3/uL (ref 140–400)
RBC: 4.13 10*6/uL (ref 3.80–5.10)
RDW: 13.1 % (ref 11.0–15.0)
Total Lymphocyte: 19.9 %
WBC: 6.6 10*3/uL (ref 3.8–10.8)

## 2020-06-10 LAB — LIPID PANEL
Cholesterol: 193 mg/dL (ref <200)
HDL: 89 mg/dL (ref >=50)
LDL Cholesterol (Calc): 86 mg/dL (calc)
Non-HDL Cholesterol (Calc): 104 mg/dL (calc) (ref <130)
Total CHOL/HDL Ratio: 2.2 (calc) (ref <5.0)
Triglycerides: 90 mg/dL (ref <150)

## 2020-06-10 LAB — COMPLETE METABOLIC PANEL WITH GFR
AG Ratio: 2.3 (calc) (ref 1.0–2.5)
ALT: 19 U/L (ref 6–29)
AST: 19 U/L (ref 10–35)
Albumin: 4.4 g/dL (ref 3.6–5.1)
Alkaline phosphatase (APISO): 62 U/L (ref 37–153)
BUN/Creatinine Ratio: 15 (calc) (ref 6–22)
BUN: 22 mg/dL (ref 7–25)
CO2: 23 mmol/L (ref 20–32)
Calcium: 10.8 mg/dL — ABNORMAL HIGH (ref 8.6–10.4)
Chloride: 104 mmol/L (ref 98–110)
Creat: 1.51 mg/dL — ABNORMAL HIGH (ref 0.60–0.93)
GFR, Est African American: 39 mL/min/{1.73_m2} — ABNORMAL LOW (ref >=60)
GFR, Est Non African American: 33 mL/min/{1.73_m2} — ABNORMAL LOW (ref >=60)
Globulin: 1.9 g/dL (calc) (ref 1.9–3.7)
Glucose, Bld: 84 mg/dL (ref 65–99)
Potassium: 4.7 mmol/L (ref 3.5–5.3)
Sodium: 139 mmol/L (ref 135–146)
Total Bilirubin: 0.5 mg/dL (ref 0.2–1.2)
Total Protein: 6.3 g/dL (ref 6.1–8.1)

## 2020-06-10 LAB — TSH: TSH: 1.37 mIU/L (ref 0.40–4.50)

## 2020-06-16 ENCOUNTER — Ambulatory Visit (INDEPENDENT_AMBULATORY_CARE_PROVIDER_SITE_OTHER): Payer: Medicare Other | Admitting: Internal Medicine

## 2020-06-16 ENCOUNTER — Encounter: Payer: Self-pay | Admitting: Internal Medicine

## 2020-06-16 VITALS — BP 155/80 | HR 68 | Ht 61.0 in | Wt 176.9 lb

## 2020-06-16 DIAGNOSIS — G4700 Insomnia, unspecified: Secondary | ICD-10-CM

## 2020-06-16 DIAGNOSIS — E785 Hyperlipidemia, unspecified: Secondary | ICD-10-CM | POA: Diagnosis not present

## 2020-06-16 DIAGNOSIS — I872 Venous insufficiency (chronic) (peripheral): Secondary | ICD-10-CM | POA: Diagnosis not present

## 2020-06-16 DIAGNOSIS — F5101 Primary insomnia: Secondary | ICD-10-CM

## 2020-06-16 DIAGNOSIS — R600 Localized edema: Secondary | ICD-10-CM

## 2020-06-16 DIAGNOSIS — L03116 Cellulitis of left lower limb: Secondary | ICD-10-CM | POA: Diagnosis not present

## 2020-06-16 MED ORDER — DOXYCYCLINE HYCLATE 100 MG PO TABS: 100.0000 mg | ORAL_TABLET | Freq: Two times a day (BID) | ORAL | 0 refills | Status: DC

## 2020-06-16 MED ORDER — ZOLPIDEM TARTRATE 10 MG PO TABS: 10.0000 mg | ORAL_TABLET | Freq: Every day | ORAL | 1 refills | Status: DC

## 2020-06-16 NOTE — Progress Notes (Signed)
Established Patient Office Visit  Subjective:  Patient ID: [name], female    DOB: 1945-08-21  Age: 74 y.o. MRN: [id number]  CC:  Chief Complaint  Patient presents with  . lab results    HPI  [name] presents for check  up, patient has a cellulitis of the left leg which is getting better.  She does not like Septra because carvedilol is a slight change antibiotic to doxycycline.  100 mg p.o. twice a day.  She was advised to take it for 7 days.  I will see her after [place] location.  If she has any further problems she can go to the urgent care.  Past Medical History:  Diagnosis Date  . Asthma    WELL CONTROLLED  . Bulging lumbar disc   . Cancer [facility]) 1999   thyroid with recurrent 2006  . Complication of anesthesia    HEART STOPPED DURING TONSILLECTOMY (AGE 89) DUE TO ALLERGY TO ETHER  . DDD (degenerative disc disease), lumbar   . DDD (degenerative disc disease), thoracolumbar   . Diverticulosis   . Family history of adverse reaction to anesthesia    PTS FATHER-UNSURE WHAT HAPPENED  . GERD (gastroesophageal reflux disease)   . Heart murmur   . Hepatitis    AGE 12 "VIRAL"  . Hypertension    OFF MEDS CURRENTLY PER PCP (Albirta Rhinehart)  . Hypothyroidism   . Interstitial cystitis   . Kidney disease   . PONV (postoperative nausea and vomiting)   . Thyroid disease     Past Surgical History:  Procedure Laterality Date  . BLADDER SURGERY  1984  . BREAST SURGERY  [id number]   mass removed  . BREAST SURGERY  April 2017   Dr Jack Quarto [facility]  . CATARACT EXTRACTION  2011  . CHOLECYSTECTOMY N/A 11/30/2015   Procedure: LAPAROSCOPIC CHOLECYSTECTOMY WITH INTRAOPERATIVE CHOLANGIOGRAM;  Surgeon: Christene Lye, MD;  Location: [facility] ORS;  Service: General;  Laterality: N/A;  . COLONOSCOPY  2005, 2015  . DILATION AND CURETTAGE OF UTERUS  1975  . EXCISIONAL HEMORRHOIDECTOMY  1975  . EYE SURGERY Left 2011  . [place]  . HERNIA REPAIR  2015  .  THYROIDECTOMY  1999  . TONSILLECTOMY  1952   AGE 89    Family History  Problem Relation Age of Onset  . Heart disease Mother   . Cancer Father        colon  . Heart disease Brother   . Diabetes Maternal Aunt   . Asthma Paternal Aunt   . Bladder Cancer Maternal Aunt   . Ovarian cancer Neg Hx   . Kidney cancer Neg Hx   . Prostate cancer Neg Hx     Social History   Socioeconomic History  . Marital status: Married    Spouse name: Not on file  . Number of children: Not on file  . Years of education: Not on file  . Highest education level: Not on file  Occupational History  . Not on file  Tobacco Use  . Smoking status: Former Smoker    Packs/day: 1.00    Years: 15.00    Pack years: 15.00    Quit date: 11/25/1985    Years since quitting: 34.5  . Smokeless tobacco: Never Used  Vaping Use  . Vaping Use: Never used  Substance and Sexual Activity  . Alcohol use: No  . Drug use: No  . Sexual activity: Never  Other Topics Concern  . Not  on file  Social History Narrative  . Not on file   Social Determinants of Health   Financial Resource Strain:   . Difficulty of Paying Living Expenses: Not on file  Food Insecurity:   . Worried About Charity fundraiser in the Last Year: Not on file  . Ran Out of Food in the Last Year: Not on file  Transportation Needs:   . Lack of Transportation (Medical): Not on file  . Lack of Transportation (Non-Medical): Not on file  Physical Activity:   . Days of Exercise per Week: Not on file  . Minutes of Exercise per Session: Not on file  Stress:   . Feeling of Stress : Not on file  Social Connections:   . Frequency of Communication with Friends and Family: Not on file  . Frequency of Social Gatherings with Friends and Family: Not on file  . Attends Religious Services: Not on file  . Active Member of Clubs or Organizations: Not on file  . Attends Archivist Meetings: Not on file  . Marital Status: Not on file  Intimate Partner  Violence:   . Fear of Current or Ex-Partner: Not on file  . Emotionally Abused: Not on file  . Physically Abused: Not on file  . Sexually Abused: Not on file     Current Outpatient Medications:  .  albuterol (PROVENTIL) (2.5 MG/3ML) 0.083% nebulizer solution, Inhale into the lungs., Disp: , Rfl:  .  ALBUTEROL IN, Inhale into the lungs as needed., Disp: , Rfl:  .  aspirin 81 MG tablet, Take 81 mg by mouth daily., Disp: , Rfl:  .  Calcium 500 MG CHEW, Chew by mouth., Disp: , Rfl:  .  cetirizine (ZYRTEC) 10 MG tablet, Take 10 mg by mouth daily., Disp: , Rfl:  .  Cholecalciferol (VITAMIN D-3) 1000 units CAPS, Take by mouth daily., Disp: , Rfl:  .  FLUoxetine (PROZAC) 40 MG capsule, Take 40 mg by mouth every morning., Disp: , Rfl:  .  fluticasone (FLONASE) 50 MCG/ACT nasal spray, Place 1 spray into both nostrils daily., Disp: , Rfl:  .  Fluticasone-Salmeterol (ADVAIR) 250-50 MCG/DOSE AEPB, Inhale 1 puff into the lungs 3 (three) times daily as needed. , Disp: , Rfl:  .  furosemide (LASIX) 20 MG tablet, Take 40 mg by mouth daily. , Disp: , Rfl:  .  gabapentin (NEURONTIN) 100 MG capsule, TAKE 2 CAPSULES BY MOUTH TWICE DAILY, Disp: , Rfl:  .  HYDROcodone-acetaminophen (NORCO) 7.5-325 MG tablet, Take by mouth., Disp: , Rfl:  .  levofloxacin (LEVAQUIN) 250 MG tablet, Take 1 tablet (250 mg total) by mouth daily., Disp: 7 tablet, Rfl: 0 .  levothyroxine (SYNTHROID) 150 MCG tablet, TAKE 1 TABLET BY MOUTH DAILY, Disp: 90 tablet, Rfl: 3 .  olmesartan (BENICAR) 20 MG tablet, Take 20 mg by mouth as needed (IF BP IS ELEVATED)., Disp: , Rfl:  .  Omega-3 Fatty Acids (FISH OIL) 1000 MG CAPS, Take by mouth., Disp: , Rfl:  .  pantoprazole (PROTONIX) 40 MG tablet, Take 1 tablet by mouth every morning. , Disp: , Rfl:  .  potassium chloride SA (K-DUR,KLOR-CON) 20 MEQ tablet, TK 1 T PO QD, Disp: , Rfl: 2 .  predniSONE (DELTASONE) 2.5 MG tablet, TAKE ONE TABLET EVERY DAY, Disp: 30 tablet, Rfl: 6 .  pregabalin  (LYRICA) 50 MG capsule, Take by mouth., Disp: , Rfl:  .  PRESCRIPTION MEDICATION, Allergy injections once weekly, Disp: , Rfl:  .  raloxifene (EVISTA)  60 MG tablet, , Disp: , Rfl: 10 .  tiZANidine (ZANAFLEX) 4 MG capsule, Take 4 mg by mouth as needed. , Disp: , Rfl:  .  vitamin C (ASCORBIC ACID) 500 MG tablet, Take by mouth., Disp: , Rfl:  .  doxycycline (VIBRA-TABS) 100 MG tablet, Take 1 tablet (100 mg total) by mouth 2 (two) times daily., Disp: 20 tablet, Rfl: 0 .  zolpidem (AMBIEN) 10 MG tablet, Take 1 tablet (10 mg total) by mouth at bedtime., Disp: 90 tablet, Rfl: 1   Allergies  Allergen Reactions  . Pollen Extract Other (See Comments)  . Ether     "HEART STOPPED WHILE IN SURGERY"  . Gramineae Pollens Other (See Comments)  . Penicillins Hives  . Tamsulosin     Other reaction(s): Other (See Comments) "made me loopy"  . Band-Aid Plus Antibiotic [Bacitracin-Polymyxin B] Rash    ROS Review of Systems  Constitutional: Negative.   HENT: Negative.   Eyes: Negative.   Respiratory: Negative.   Cardiovascular: Negative.   Gastrointestinal: Negative.   Endocrine: Negative.   Genitourinary: Negative.   Musculoskeletal: Positive for arthralgias.       Swelling of the both legs there is also on the left leg in the middle,.  Anteriorly  Skin: Negative.   Allergic/Immunologic: Negative.   Neurological: Negative.   Hematological: Negative.   Psychiatric/Behavioral: Negative.   All other systems reviewed and are negative.     Objective:    Physical Exam Vitals reviewed.  Constitutional:      Appearance: Normal appearance.  HENT:     Mouth/Throat:     Mouth: Mucous membranes are moist.  Eyes:     Pupils: Pupils are equal, round, and reactive to light.  Neck:     Vascular: No carotid bruit.  Cardiovascular:     Rate and Rhythm: Normal rate and regular rhythm.     Pulses: Normal pulses.     Heart sounds: Normal heart sounds.  Pulmonary:     Effort: Pulmonary effort is  normal.     Breath sounds: Normal breath sounds.  Abdominal:     General: Bowel sounds are normal.     Palpations: Abdomen is soft. There is no hepatomegaly, splenomegaly or mass.     Tenderness: There is no abdominal tenderness.     Hernia: No hernia is present.  Musculoskeletal:        General: No tenderness.     Cervical back: Neck supple.     Right lower leg: No edema.     Left lower leg: No edema.       Legs:     Comments: There is a scab in the left leg with associated edema.  Skin:    Findings: No rash.  Neurological:     Mental Status: She is alert and oriented to person, place, and time.     Motor: No weakness.  Psychiatric:        Mood and Affect: Mood and affect normal.        Behavior: Behavior normal.     BP (!) 155/80   Pulse 68   Ht 5\' 1"  (1.549 m)   Wt 176 lb 14.4 oz (80.2 kg)   BMI 33.42 kg/m  Wt Readings from Last 3 Encounters:  06/16/20 176 lb 14.4 oz (80.2 kg)  06/01/20 176 lb 4.8 oz (80 kg)  01/10/20 183 lb 3.2 oz (83.1 kg)     Health Maintenance Due  Topic Date Due  . Hepatitis C  Screening  Never done  . COVID-19 Vaccine (1) Never done  . TETANUS/TDAP  Never done  . DEXA SCAN  Never done  . PNA vac Low Risk Adult (1 of 2 - PCV13) Never done  . INFLUENZA VACCINE  02/23/2020    There are no preventive care reminders to display for this patient.  Lab Results  Component Value Date   TSH 1.37 06/09/2020   Lab Results  Component Value Date   WBC 6.6 06/09/2020   HGB 12.3 06/09/2020   HCT 36.7 06/09/2020   MCV 88.9 06/09/2020   PLT 319 06/09/2020   Lab Results  Component Value Date   NA 139 06/09/2020   K 4.7 06/09/2020   CO2 23 06/09/2020   GLUCOSE 84 06/09/2020   BUN 22 06/09/2020   CREATININE 1.51 (H) 06/09/2020   BILITOT 0.5 06/09/2020   ALKPHOS 66 07/08/2014   AST 19 06/09/2020   ALT 19 06/09/2020   PROT 6.3 06/09/2020   ALBUMIN 4.1 07/08/2014   CALCIUM 10.8 (H) 06/09/2020   ANIONGAP 9 03/06/2017   Lab Results    Component Value Date   CHOL 193 06/09/2020   Lab Results  Component Value Date   HDL 89 06/09/2020   Lab Results  Component Value Date   LDLCALC 86 06/09/2020   Lab Results  Component Value Date   TRIG 90 06/09/2020   Lab Results  Component Value Date   CHOLHDL 2.2 06/09/2020   No results found for: HGBA1C    Assessment & Plan:   Problem List Items Addressed This Visit      Cardiovascular and Mediastinum   Chronic venous insufficiency    Patient was advised to use support hose.        Other   Bilateral lower extremity edema    Patient was advised to follow DASH diet and use support hose.      Cellulitis of left lower extremity    Patient is continue to use antibiotic as prescribed.  If she is not better she will be referred to either wound clinic or vascular specialist      Hyperlipidemia    - [name] hyperlipidemia is stable on diet - The patient will continue the current treatment regimen.  - I encouraged the patient to eat more vegetables and whole wheat, and to avoid fatty foods like whole milk, hard cheese, egg yolks, margarine, baked sweets, and fried foods.  - I encouraged the patient to live an active lifestyle and complete activities for 40 minutes at least three times per week.  - I instructed the patient to go to the ER if they begin having chest pain.        Primary insomnia - Primary    Patient takes 10 mg  Of ambien p.o. daily for primary insomnia       Other Visit Diagnoses    Insomnia, unspecified type       Relevant Medications   zolpidem (AMBIEN) 10 MG tablet      Meds ordered this encounter  Medications  . doxycycline (VIBRA-TABS) 100 MG tablet    Sig: Take 1 tablet (100 mg total) by mouth 2 (two) times daily.    Dispense:  20 tablet    Refill:  0  . zolpidem (AMBIEN) 10 MG tablet    Sig: Take 1 tablet (10 mg total) by mouth at bedtime.    Dispense:  90 tablet    Refill:  1    Follow-up: No follow-ups  on file.     [name], MD

## 2020-06-16 NOTE — Assessment & Plan Note (Signed)
Patient is continue to use antibiotic as prescribed.  If she is not better she will be referred to either wound clinic or vascular specialist

## 2020-06-16 NOTE — Assessment & Plan Note (Signed)
Patient was advised to use support hose 

## 2020-06-16 NOTE — Assessment & Plan Note (Signed)
-   [name] hyperlipidemia is stable on diet - The patient will continue the current treatment regimen.  - I encouraged the patient to eat more vegetables and whole wheat, and to avoid fatty foods like whole milk, hard cheese, egg yolks, margarine, baked sweets, and fried foods.  - I encouraged the patient to live an active lifestyle and complete activities for 40 minutes at least three times per week.  - I instructed the patient to go to the ER if they begin having chest pain.

## 2020-06-16 NOTE — Assessment & Plan Note (Addendum)
Patient takes 10 mg  Of ambien p.o. daily for primary insomnia

## 2020-06-16 NOTE — Assessment & Plan Note (Signed)
Patient was advised to follow DASH diet and use support hose.

## 2020-06-22 NOTE — Addendum Note (Signed)
Addended by: Alois Cliche on: 06/22/2020 09:31 AM   Modules accepted: Orders

## 2020-06-24 ENCOUNTER — Ambulatory Visit (INDEPENDENT_AMBULATORY_CARE_PROVIDER_SITE_OTHER): Payer: Medicare Other | Admitting: Internal Medicine

## 2020-06-24 ENCOUNTER — Other Ambulatory Visit: Payer: Self-pay

## 2020-06-24 ENCOUNTER — Encounter: Payer: Self-pay | Admitting: Internal Medicine

## 2020-06-24 VITALS — BP 133/82 | HR 90 | Ht 61.0 in | Wt 175.8 lb

## 2020-06-24 DIAGNOSIS — I10 Essential (primary) hypertension: Secondary | ICD-10-CM | POA: Diagnosis not present

## 2020-06-24 DIAGNOSIS — F5101 Primary insomnia: Secondary | ICD-10-CM

## 2020-06-24 DIAGNOSIS — L03116 Cellulitis of left lower limb: Secondary | ICD-10-CM

## 2020-06-24 DIAGNOSIS — K649 Unspecified hemorrhoids: Secondary | ICD-10-CM

## 2020-06-24 DIAGNOSIS — E785 Hyperlipidemia, unspecified: Secondary | ICD-10-CM

## 2020-06-24 DIAGNOSIS — L02416 Cutaneous abscess of left lower limb: Secondary | ICD-10-CM | POA: Diagnosis not present

## 2020-06-24 MED ORDER — DOXYCYCLINE HYCLATE 100 MG PO TABS
100.0000 mg | ORAL_TABLET | Freq: Two times a day (BID) | ORAL | 0 refills | Status: DC
Start: 2020-06-24 — End: 2020-09-08

## 2020-06-24 NOTE — Progress Notes (Signed)
Established Patient Office Visit  Subjective:  Patient ID: [name], female    DOB: 12-Mar-1946  Age: 74 y.o. MRN: [id number]  CC:  Chief Complaint  Patient presents with  . 2 Week Follow-up    follow up wound on leg    HPI  [name] presents for general checkup she has a wound in the left leg with some cellulitis  Past Medical History:  Diagnosis Date  . Asthma    WELL CONTROLLED  . Bulging lumbar disc   . Cancer [facility]) 1999   thyroid with recurrent 2006  . Complication of anesthesia    HEART STOPPED DURING TONSILLECTOMY (AGE 1) DUE TO ALLERGY TO ETHER  . DDD (degenerative disc disease), lumbar   . DDD (degenerative disc disease), thoracolumbar   . Diverticulosis   . Family history of adverse reaction to anesthesia    PTS FATHER-UNSURE WHAT HAPPENED  . GERD (gastroesophageal reflux disease)   . Heart murmur   . Hepatitis    AGE 1 "VIRAL"  . Hypertension    OFF MEDS CURRENTLY PER PCP (Cadey Bazile)  . Hypothyroidism   . Interstitial cystitis   . Kidney disease   . PONV (postoperative nausea and vomiting)   . Thyroid disease     Past Surgical History:  Procedure Laterality Date  . BLADDER SURGERY  1984  . BREAST SURGERY  [id number]   mass removed  . BREAST SURGERY  April 2017   Dr Jack Quarto [facility]  . CATARACT EXTRACTION  2011  . CHOLECYSTECTOMY N/A 11/30/2015   Procedure: LAPAROSCOPIC CHOLECYSTECTOMY WITH INTRAOPERATIVE CHOLANGIOGRAM;  Surgeon: Christene Lye, MD;  Location: [facility] ORS;  Service: General;  Laterality: N/A;  . COLONOSCOPY  2005, 2015  . DILATION AND CURETTAGE OF UTERUS  1975  . EXCISIONAL HEMORRHOIDECTOMY  1975  . EYE SURGERY Left 2011  . [place]  . HERNIA REPAIR  2015  . THYROIDECTOMY  1999  . TONSILLECTOMY  1952   AGE 1    Family History  Problem Relation Age of Onset  . Heart disease Mother   . Cancer Father        colon  . Heart disease Brother   . Diabetes Maternal Aunt   . Asthma Paternal Aunt    . Bladder Cancer Maternal Aunt   . Ovarian cancer Neg Hx   . Kidney cancer Neg Hx   . Prostate cancer Neg Hx     Social History   Socioeconomic History  . Marital status: Married    Spouse name: Not on file  . Number of children: Not on file  . Years of education: Not on file  . Highest education level: Not on file  Occupational History  . Not on file  Tobacco Use  . Smoking status: Former Smoker    Packs/day: 1.00    Years: 15.00    Pack years: 15.00    Quit date: 11/25/1985    Years since quitting: 34.6  . Smokeless tobacco: Never Used  Vaping Use  . Vaping Use: Never used  Substance and Sexual Activity  . Alcohol use: No  . Drug use: No  . Sexual activity: Never  Other Topics Concern  . Not on file  Social History Narrative  . Not on file   Social Determinants of Health   Financial Resource Strain: Not on file  Food Insecurity: Not on file  Transportation Needs: Not on file  Physical Activity: Not on file  Stress: Not on  file  Social Connections: Not on file  Intimate Partner Violence: Not on file     Current Outpatient Medications:  .  albuterol (PROVENTIL) (2.5 MG/3ML) 0.083% nebulizer solution, Inhale into the lungs., Disp: , Rfl:  .  ALBUTEROL IN, Inhale into the lungs as needed., Disp: , Rfl:  .  aspirin 81 MG tablet, Take 81 mg by mouth daily., Disp: , Rfl:  .  Calcium 500 MG CHEW, Chew by mouth., Disp: , Rfl:  .  cetirizine (ZYRTEC) 10 MG tablet, Take 10 mg by mouth daily., Disp: , Rfl:  .  Cholecalciferol (VITAMIN D-3) 1000 units CAPS, Take by mouth daily., Disp: , Rfl:  .  doxycycline (VIBRA-TABS) 100 MG tablet, Take 1 tablet (100 mg total) by mouth 2 (two) times daily., Disp: 20 tablet, Rfl: 0 .  FLUoxetine (PROZAC) 40 MG capsule, Take 40 mg by mouth every morning., Disp: , Rfl:  .  fluticasone (FLONASE) 50 MCG/ACT nasal spray, Place 1 spray into both nostrils daily., Disp: , Rfl:  .  Fluticasone-Salmeterol (ADVAIR) 250-50 MCG/DOSE AEPB, Inhale 1  puff into the lungs 3 (three) times daily as needed. , Disp: , Rfl:  .  furosemide (LASIX) 20 MG tablet, Take 40 mg by mouth daily. , Disp: , Rfl:  .  gabapentin (NEURONTIN) 100 MG capsule, TAKE 2 CAPSULES BY MOUTH TWICE DAILY, Disp: , Rfl:  .  HYDROcodone-acetaminophen (NORCO) 7.5-325 MG tablet, Take by mouth., Disp: , Rfl:  .  levofloxacin (LEVAQUIN) 250 MG tablet, Take 1 tablet (250 mg total) by mouth daily., Disp: 7 tablet, Rfl: 0 .  levothyroxine (SYNTHROID) 150 MCG tablet, TAKE 1 TABLET BY MOUTH DAILY, Disp: 90 tablet, Rfl: 3 .  olmesartan (BENICAR) 20 MG tablet, Take 20 mg by mouth as needed (IF BP IS ELEVATED)., Disp: , Rfl:  .  Omega-3 Fatty Acids (FISH OIL) 1000 MG CAPS, Take by mouth., Disp: , Rfl:  .  pantoprazole (PROTONIX) 40 MG tablet, Take 1 tablet by mouth every morning. , Disp: , Rfl:  .  potassium chloride SA (K-DUR,KLOR-CON) 20 MEQ tablet, TK 1 T PO QD, Disp: , Rfl: 2 .  predniSONE (DELTASONE) 2.5 MG tablet, TAKE ONE TABLET EVERY DAY, Disp: 30 tablet, Rfl: 6 .  pregabalin (LYRICA) 50 MG capsule, Take by mouth., Disp: , Rfl:  .  PRESCRIPTION MEDICATION, Allergy injections once weekly, Disp: , Rfl:  .  raloxifene (EVISTA) 60 MG tablet, , Disp: , Rfl: 10 .  tiZANidine (ZANAFLEX) 4 MG capsule, Take 4 mg by mouth as needed. , Disp: , Rfl:  .  vitamin C (ASCORBIC ACID) 500 MG tablet, Take by mouth., Disp: , Rfl:  .  zolpidem (AMBIEN) 10 MG tablet, Take 1 tablet (10 mg total) by mouth at bedtime., Disp: 90 tablet, Rfl: 1 .  fluconazole (DIFLUCAN) 100 MG tablet, TAKE 1 TABLET BY MOUTH DAILY FOR 4 DAYS, Disp: 4 tablet, Rfl: 0   Allergies  Allergen Reactions  . Pollen Extract Other (See Comments)  . Ether     "HEART STOPPED WHILE IN SURGERY"  . Gramineae Pollens Other (See Comments)  . Penicillins Hives  . Tamsulosin     Other reaction(s): Other (See Comments) "made me loopy"  . Band-Aid Plus Antibiotic [Bacitracin-Polymyxin B] Rash    ROS Review of Systems   Constitutional: Negative.   HENT: Negative.   Eyes: Negative.   Respiratory: Negative.   Cardiovascular: Negative.   Gastrointestinal: Negative.   Endocrine: Negative.   Genitourinary: Negative.   Musculoskeletal:  Negative.        Patient has a cellulitis of the left leg with ulcer formation.  Skin: Negative.   Allergic/Immunologic: Negative.   Neurological: Negative.   Hematological: Negative.   Psychiatric/Behavioral: Negative.   All other systems reviewed and are negative.     Objective:    Physical Exam Vitals reviewed.  Constitutional:      Appearance: Normal appearance.  HENT:     Mouth/Throat:     Mouth: Mucous membranes are moist.  Eyes:     Pupils: Pupils are equal, round, and reactive to light.  Neck:     Vascular: No carotid bruit.  Cardiovascular:     Rate and Rhythm: Normal rate and regular rhythm.     Pulses: Normal pulses.     Heart sounds: Normal heart sounds.  Pulmonary:     Effort: Pulmonary effort is normal.     Breath sounds: Normal breath sounds.  Abdominal:     General: Bowel sounds are normal.     Palpations: Abdomen is soft. There is no hepatomegaly, splenomegaly or mass.     Tenderness: There is no abdominal tenderness.     Hernia: No hernia is present.  Musculoskeletal:        General: No tenderness.     Cervical back: Neck supple.     Right lower leg: No edema.     Left lower leg: No edema.     Comments: Also of the left leg with scab formation  Skin:    Findings: No rash.  Neurological:     Mental Status: She is alert and oriented to person, place, and time.     Motor: No weakness.  Psychiatric:        Mood and Affect: Mood and affect normal.        Behavior: Behavior normal.     BP 133/82   Pulse 90   Ht 5\' 1"  (1.549 m)   Wt 175 lb 12.8 oz (79.7 kg)   BMI 33.22 kg/m  Wt Readings from Last 3 Encounters:  06/30/20 177 lb (80.3 kg)  06/24/20 175 lb 12.8 oz (79.7 kg)  06/16/20 176 lb 14.4 oz (80.2 kg)     Health  Maintenance Due  Topic Date Due  . Hepatitis C Screening  Never done  . COVID-19 Vaccine (1) Never done  . TETANUS/TDAP  Never done  . DEXA SCAN  Never done    There are no preventive care reminders to display for this patient.  Lab Results  Component Value Date   TSH 1.37 06/09/2020   Lab Results  Component Value Date   WBC 6.6 06/09/2020   HGB 12.3 06/09/2020   HCT 36.7 06/09/2020   MCV 88.9 06/09/2020   PLT 319 06/09/2020   Lab Results  Component Value Date   NA 139 06/09/2020   K 4.7 06/09/2020   CO2 23 06/09/2020   GLUCOSE 84 06/09/2020   BUN 22 06/09/2020   CREATININE 1.51 (H) 06/09/2020   BILITOT 0.5 06/09/2020   ALKPHOS 66 07/08/2014   AST 19 06/09/2020   ALT 19 06/09/2020   PROT 6.3 06/09/2020   ALBUMIN 4.1 07/08/2014   CALCIUM 10.8 (H) 06/09/2020   ANIONGAP 9 03/06/2017   Lab Results  Component Value Date   CHOL 193 06/09/2020   Lab Results  Component Value Date   HDL 89 06/09/2020   Lab Results  Component Value Date   LDLCALC 86 06/09/2020   Lab Results  Component Value  Date   TRIG 90 06/09/2020   Lab Results  Component Value Date   CHOLHDL 2.2 06/09/2020   No results found for: HGBA1C    Assessment & Plan:   Problem List Items Addressed This Visit      Cardiovascular and Mediastinum   Hemorrhoid    Patient was referred to GI.      Essential hypertension - Primary    Patient blood pressure stable on the present medication.        Other   Cellulitis and abscess of left leg    Patient has cellulitis and an ulcer formation of the left leg.  Patient will be referred to the wound clinic for follow-up.      Hyperlipidemia    - [name] hyperlipidemia is stable on diet. - The patient will continue the current treatment regimen.  - I encouraged the patient to eat more vegetables and whole wheat, and to avoid fatty foods like whole milk, hard cheese, egg yolks, margarine, baked sweets, and fried foods.  - I encouraged the  patient to live an active lifestyle and complete activities for 40 minutes at least three times per week.  - I instructed the patient to go to the ER if they begin having chest pain.        Primary insomnia    Sleep hygiene was discussed with the patient.         Meds ordered this encounter  Medications  . doxycycline (VIBRA-TABS) 100 MG tablet    Sig: Take 1 tablet (100 mg total) by mouth 2 (two) times daily.    Dispense:  20 tablet    Refill:  0    Follow-up: No follow-ups on file.    [name], MD

## 2020-06-25 DIAGNOSIS — J3089 Other allergic rhinitis: Secondary | ICD-10-CM | POA: Diagnosis not present

## 2020-06-25 DIAGNOSIS — J301 Allergic rhinitis due to pollen: Secondary | ICD-10-CM | POA: Diagnosis not present

## 2020-06-29 ENCOUNTER — Other Ambulatory Visit: Payer: Self-pay | Admitting: *Deleted

## 2020-06-29 DIAGNOSIS — R600 Localized edema: Secondary | ICD-10-CM

## 2020-06-30 ENCOUNTER — Other Ambulatory Visit: Payer: Self-pay

## 2020-06-30 ENCOUNTER — Ambulatory Visit (INDEPENDENT_AMBULATORY_CARE_PROVIDER_SITE_OTHER): Payer: Medicare Other | Admitting: Internal Medicine

## 2020-06-30 ENCOUNTER — Encounter: Payer: Self-pay | Admitting: Internal Medicine

## 2020-06-30 VITALS — BP 140/81 | HR 82 | Ht 61.0 in | Wt 177.0 lb

## 2020-06-30 DIAGNOSIS — R1310 Dysphagia, unspecified: Secondary | ICD-10-CM

## 2020-06-30 DIAGNOSIS — N184 Chronic kidney disease, stage 4 (severe): Secondary | ICD-10-CM

## 2020-06-30 DIAGNOSIS — L03116 Cellulitis of left lower limb: Secondary | ICD-10-CM | POA: Diagnosis not present

## 2020-06-30 DIAGNOSIS — I10 Essential (primary) hypertension: Secondary | ICD-10-CM | POA: Diagnosis not present

## 2020-06-30 DIAGNOSIS — R600 Localized edema: Secondary | ICD-10-CM

## 2020-06-30 NOTE — Progress Notes (Signed)
Established Patient Office Visit  Subjective:  Patient ID: [name], female    DOB: 06-11-1946  Age: 74 y.o. MRN: [id number]  CC:  Chief Complaint  Patient presents with  . Edema    Patient is here for a 1 week follow up for her leg pain and swelling     HPI  [name] presents for evaluation of the swelling of the left leg and edema of the right leg.  Past Medical History:  Diagnosis Date  . Asthma    WELL CONTROLLED  . Bulging lumbar disc   . Cancer [facility]) 1999   thyroid with recurrent 2006  . Complication of anesthesia    HEART STOPPED DURING TONSILLECTOMY (AGE 46) DUE TO ALLERGY TO ETHER  . DDD (degenerative disc disease), lumbar   . DDD (degenerative disc disease), thoracolumbar   . Diverticulosis   . Family history of adverse reaction to anesthesia    PTS FATHER-UNSURE WHAT HAPPENED  . GERD (gastroesophageal reflux disease)   . Heart murmur   . Hepatitis    AGE 65 "VIRAL"  . Hypertension    OFF MEDS CURRENTLY PER PCP (MASOUD)  . Hypothyroidism   . Interstitial cystitis   . Kidney disease   . PONV (postoperative nausea and vomiting)   . Thyroid disease     Past Surgical History:  Procedure Laterality Date  . BLADDER SURGERY  1984  . BREAST SURGERY  [id number]   mass removed  . BREAST SURGERY  April 2017   Dr Jack Quarto [facility]  . CATARACT EXTRACTION  2011  . CHOLECYSTECTOMY N/A 11/30/2015   Procedure: LAPAROSCOPIC CHOLECYSTECTOMY WITH INTRAOPERATIVE CHOLANGIOGRAM;  Surgeon: Christene Lye, MD;  Location: [facility] ORS;  Service: General;  Laterality: N/A;  . COLONOSCOPY  2005, 2015  . DILATION AND CURETTAGE OF UTERUS  1975  . EXCISIONAL HEMORRHOIDECTOMY  1975  . EYE SURGERY Left 2011  . [place]  . HERNIA REPAIR  2015  . THYROIDECTOMY  1999  . TONSILLECTOMY  1952   AGE 46    Family History  Problem Relation Age of Onset  . Heart disease Mother   . Cancer Father        colon  . Heart disease Brother   . Diabetes  Maternal Aunt   . Asthma Paternal Aunt   . Bladder Cancer Maternal Aunt   . Ovarian cancer Neg Hx   . Kidney cancer Neg Hx   . Prostate cancer Neg Hx     Social History   Socioeconomic History  . Marital status: Married    Spouse name: Not on file  . Number of children: Not on file  . Years of education: Not on file  . Highest education level: Not on file  Occupational History  . Not on file  Tobacco Use  . Smoking status: Former Smoker    Packs/day: 1.00    Years: 15.00    Pack years: 15.00    Quit date: 11/25/1985    Years since quitting: 34.6  . Smokeless tobacco: Never Used  Vaping Use  . Vaping Use: Never used  Substance and Sexual Activity  . Alcohol use: No  . Drug use: No  . Sexual activity: Never  Other Topics Concern  . Not on file  Social History Narrative  . Not on file   Social Determinants of Health   Financial Resource Strain: Not on file  Food Insecurity: Not on file  Transportation Needs: Not on file  Physical Activity: Not on file  Stress: Not on file  Social Connections: Not on file  Intimate Partner Violence: Not on file     Current Outpatient Medications:  .  albuterol (PROVENTIL) (2.5 MG/3ML) 0.083% nebulizer solution, Inhale into the lungs., Disp: , Rfl:  .  ALBUTEROL IN, Inhale into the lungs as needed., Disp: , Rfl:  .  aspirin 81 MG tablet, Take 81 mg by mouth daily., Disp: , Rfl:  .  Calcium 500 MG CHEW, Chew by mouth., Disp: , Rfl:  .  cetirizine (ZYRTEC) 10 MG tablet, Take 10 mg by mouth daily., Disp: , Rfl:  .  Cholecalciferol (VITAMIN D-3) 1000 units CAPS, Take by mouth daily., Disp: , Rfl:  .  doxycycline (VIBRA-TABS) 100 MG tablet, Take 1 tablet (100 mg total) by mouth 2 (two) times daily., Disp: 20 tablet, Rfl: 0 .  FLUoxetine (PROZAC) 40 MG capsule, Take 40 mg by mouth every morning., Disp: , Rfl:  .  fluticasone (FLONASE) 50 MCG/ACT nasal spray, Place 1 spray into both nostrils daily., Disp: , Rfl:  .  Fluticasone-Salmeterol  (ADVAIR) 250-50 MCG/DOSE AEPB, Inhale 1 puff into the lungs 3 (three) times daily as needed. , Disp: , Rfl:  .  furosemide (LASIX) 20 MG tablet, Take 40 mg by mouth daily. , Disp: , Rfl:  .  gabapentin (NEURONTIN) 100 MG capsule, TAKE 2 CAPSULES BY MOUTH TWICE DAILY, Disp: , Rfl:  .  HYDROcodone-acetaminophen (NORCO) 7.5-325 MG tablet, Take by mouth., Disp: , Rfl:  .  levofloxacin (LEVAQUIN) 250 MG tablet, Take 1 tablet (250 mg total) by mouth daily., Disp: 7 tablet, Rfl: 0 .  levothyroxine (SYNTHROID) 150 MCG tablet, TAKE 1 TABLET BY MOUTH DAILY, Disp: 90 tablet, Rfl: 3 .  olmesartan (BENICAR) 20 MG tablet, Take 20 mg by mouth as needed (IF BP IS ELEVATED)., Disp: , Rfl:  .  Omega-3 Fatty Acids (FISH OIL) 1000 MG CAPS, Take by mouth., Disp: , Rfl:  .  pantoprazole (PROTONIX) 40 MG tablet, Take 1 tablet by mouth every morning. , Disp: , Rfl:  .  potassium chloride SA (K-DUR,KLOR-CON) 20 MEQ tablet, TK 1 T PO QD, Disp: , Rfl: 2 .  predniSONE (DELTASONE) 2.5 MG tablet, TAKE ONE TABLET EVERY DAY, Disp: 30 tablet, Rfl: 6 .  pregabalin (LYRICA) 50 MG capsule, Take by mouth., Disp: , Rfl:  .  PRESCRIPTION MEDICATION, Allergy injections once weekly, Disp: , Rfl:  .  raloxifene (EVISTA) 60 MG tablet, , Disp: , Rfl: 10 .  tiZANidine (ZANAFLEX) 4 MG capsule, Take 4 mg by mouth as needed. , Disp: , Rfl:  .  vitamin C (ASCORBIC ACID) 500 MG tablet, Take by mouth., Disp: , Rfl:  .  zolpidem (AMBIEN) 10 MG tablet, Take 1 tablet (10 mg total) by mouth at bedtime., Disp: 90 tablet, Rfl: 1   Allergies  Allergen Reactions  . Pollen Extract Other (See Comments)  . Ether     "HEART STOPPED WHILE IN SURGERY"  . Gramineae Pollens Other (See Comments)  . Penicillins Hives  . Tamsulosin     Other reaction(s): Other (See Comments) "made me loopy"  . Band-Aid Plus Antibiotic [Bacitracin-Polymyxin B] Rash    ROS Review of Systems  Constitutional: Negative.   HENT: Negative.  Negative for mouth sores.   Eyes:  Negative.   Respiratory: Negative.   Cardiovascular: Positive for leg swelling. Negative for chest pain.  Gastrointestinal: Negative.  Negative for blood in stool and vomiting.  Endocrine: Negative.  Genitourinary: Negative.   Musculoskeletal: Positive for arthralgias and back pain.  Skin: Negative.   Allergic/Immunologic: Negative.   Neurological: Negative.  Negative for dizziness and numbness.  Hematological: Negative.   Psychiatric/Behavioral: Negative.  The patient is not hyperactive.   All other systems reviewed and are negative.     Objective:    Physical Exam Vitals reviewed.  Constitutional:      Appearance: Normal appearance.  HENT:     Mouth/Throat:     Mouth: Mucous membranes are moist.  Eyes:     Pupils: Pupils are equal, round, and reactive to light.  Neck:     Vascular: No carotid bruit.  Cardiovascular:     Rate and Rhythm: Normal rate and regular rhythm.     Pulses: Normal pulses.     Heart sounds: Normal heart sounds.  Pulmonary:     Effort: Pulmonary effort is normal.     Breath sounds: Normal breath sounds.  Abdominal:     General: Bowel sounds are normal.     Palpations: Abdomen is soft. There is no hepatomegaly, splenomegaly or mass.     Tenderness: There is no abdominal tenderness.     Hernia: No hernia is present.  Musculoskeletal:        General: No tenderness.     Cervical back: Neck supple.     Right lower leg: No edema.     Left lower leg: No edema.     Comments: Swelling of the both legs with ulcer on the left leg cellulitis is getting better on the left leg.  Skin:    Findings: No rash.  Neurological:     Mental Status: She is alert and oriented to person, place, and time.     Motor: No weakness.  Psychiatric:        Mood and Affect: Mood and affect normal.        Behavior: Behavior normal.     BP 140/81   Pulse 82   Ht 5\' 1"  (1.549 m)   Wt 177 lb (80.3 kg)   BMI 33.44 kg/m  Wt Readings from Last 3 Encounters:  06/30/20  177 lb (80.3 kg)  06/24/20 175 lb 12.8 oz (79.7 kg)  06/16/20 176 lb 14.4 oz (80.2 kg)     Health Maintenance Due  Topic Date Due  . Hepatitis C Screening  Never done  . COVID-19 Vaccine (1) Never done  . TETANUS/TDAP  Never done  . DEXA SCAN  Never done  . INFLUENZA VACCINE  02/23/2020    There are no preventive care reminders to display for this patient.  Lab Results  Component Value Date   TSH 1.37 06/09/2020   Lab Results  Component Value Date   WBC 6.6 06/09/2020   HGB 12.3 06/09/2020   HCT 36.7 06/09/2020   MCV 88.9 06/09/2020   PLT 319 06/09/2020   Lab Results  Component Value Date   NA 139 06/09/2020   K 4.7 06/09/2020   CO2 23 06/09/2020   GLUCOSE 84 06/09/2020   BUN 22 06/09/2020   CREATININE 1.51 (H) 06/09/2020   BILITOT 0.5 06/09/2020   ALKPHOS 66 07/08/2014   AST 19 06/09/2020   ALT 19 06/09/2020   PROT 6.3 06/09/2020   ALBUMIN 4.1 07/08/2014   CALCIUM 10.8 (H) 06/09/2020   ANIONGAP 9 03/06/2017   Lab Results  Component Value Date   CHOL 193 06/09/2020   Lab Results  Component Value Date   HDL 89 06/09/2020  Lab Results  Component Value Date   LDLCALC 86 06/09/2020   Lab Results  Component Value Date   TRIG 90 06/09/2020   Lab Results  Component Value Date   CHOLHDL 2.2 06/09/2020   No results found for: HGBA1C    Assessment & Plan:   Problem List Items Addressed This Visit      Cardiovascular and Mediastinum   Essential hypertension    Blood pressure is stable on current medical regime        Digestive   Dysphagia - Primary    I will refer the patient to GI for evaluation      Relevant Orders   Ambulatory referral to Gastroenterology     Genitourinary   Stage 4 chronic kidney disease ([place])    Patient is being followed up by nephrologist.        Other   Bilateral lower extremity edema    Patient has a swelling of the both leg due to varicose veins. left leg has a scab formation which is healing slowly.  I  advised her to use Ace bandages.  She will be referred to vascular specialist as well as wound care      Cellulitis of left lower extremity    Cellulitis of the left leg is getting better         No orders of the defined types were placed in this encounter.   Follow-up: No follow-ups on file.    [name], MD

## 2020-07-03 ENCOUNTER — Encounter: Payer: Self-pay | Admitting: Internal Medicine

## 2020-07-03 ENCOUNTER — Encounter: Payer: Medicare Other | Attending: Physician Assistant | Admitting: Physician Assistant

## 2020-07-03 ENCOUNTER — Other Ambulatory Visit: Payer: Self-pay

## 2020-07-03 DIAGNOSIS — I10 Essential (primary) hypertension: Secondary | ICD-10-CM | POA: Insufficient documentation

## 2020-07-03 DIAGNOSIS — I872 Venous insufficiency (chronic) (peripheral): Secondary | ICD-10-CM | POA: Insufficient documentation

## 2020-07-03 DIAGNOSIS — R1311 Dysphagia, oral phase: Secondary | ICD-10-CM | POA: Insufficient documentation

## 2020-07-03 DIAGNOSIS — L97822 Non-pressure chronic ulcer of other part of left lower leg with fat layer exposed: Secondary | ICD-10-CM | POA: Diagnosis not present

## 2020-07-03 DIAGNOSIS — R1310 Dysphagia, unspecified: Secondary | ICD-10-CM | POA: Insufficient documentation

## 2020-07-03 NOTE — Progress Notes (Signed)
[name], [name] ([id number]) Visit Report for 07/03/2020 Abuse/Suicide Risk Screen Details Patient Name: [name], [name]. Date of Service: 07/03/2020 9:15 AM Medical Record Number: [id number] Patient Account Number: [id number] Date of Birth/Sex: Nov 08, 1945 (74 y.o. F) Treating RN: Army Melia Primary Care Kennet Mccort: [name] Other Clinician: Referring Latanya Hemmer: [name] Treating Garren Greenman/Extender: [name] in Treatment: 0 Abuse/Suicide Risk Screen Items Answer ABUSE RISK SCREEN: Has anyone close to you tried to hurt or harm you recentlyo No Do you feel uncomfortable with anyone in your familyo No Has anyone forced you do things that you didnot want to doo No Electronic Signature(s) Signed: 07/03/2020 10:59:34 AM By: Army Melia Entered By: Army Melia on 07/03/2020 09:44:16 [name], [name]. ([id number]) -------------------------------------------------------------------------------- Activities of Daily Living Details Patient Name: [name], [name]. Date of Service: 07/03/2020 9:15 AM Medical Record Number: [id number] Patient Account Number: [id number] Date of Birth/Sex: 1946-06-02 (74 y.o. F) Treating RN: Army Melia Primary Care Ebrima Ranta: [name] Other Clinician: Referring Dao Mearns: [name] Treating Lynlee Stratton/Extender: [name] in Treatment: 0 Activities of Daily Living Items Answer Activities of Daily Living (Please select one for each item) Drive Automobile Completely Able Take Medications Completely Able Use Telephone Completely Able Care for Appearance Completely Able Use Toilet Completely Able Bath / Shower Completely Able Dress Self Completely Able Feed Self Completely Able Walk Completely Able Get In / Out Bed Completely Able Housework Completely Able Prepare Meals Completely Able Handle Money Completely Able Shop for Self Completely Able Electronic Signature(s) Signed: 07/03/2020 10:59:34 AM By: Army Melia Entered By: Army Melia on 07/03/2020 09:44:25 [name], [name] ([id number]) -------------------------------------------------------------------------------- Education Screening Details Patient Name: [name], [name]. Date of Service: 07/03/2020 9:15 AM Medical Record Number: [id number] Patient Account Number: [id number] Date of Birth/Sex: Apr 14, 1946 (74 y.o. F) Treating RN: Army Melia Primary Care Myrtle Haller: [name] Other Clinician: Referring Pennie Vanblarcom: [name] Treating Havah Ammon/Extender: [name] in Treatment: 0 Primary Learner Assessed: Patient Learning Preferences/Education Level/Primary Language Learning Preference: Explanation Highest Education Level: High School Preferred Language: English Cognitive Barrier Language Barrier: No Translator Needed: No Memory Deficit: No Emotional Barrier: No Cultural/Religious Beliefs Affecting Medical Care: No Physical Barrier Impaired Vision: No Impaired Hearing: No Decreased Hand dexterity: No Knowledge/Comprehension Knowledge Level: High Comprehension Level: High Ability to understand written instructions: High Ability to understand verbal instructions: High Motivation Anxiety Level: Calm Cooperation: Cooperative Education Importance: Acknowledges Need Interest in Health Problems: Asks Questions Perception: Coherent Willingness to Engage in Self-Management High Activities: Readiness to Engage in Self-Management High Activities: Electronic Signature(s) Signed: 07/03/2020 10:59:34 AM By: Army Melia Entered By: Army Melia on 07/03/2020 09:44:44 [name], [name] ([id number]) -------------------------------------------------------------------------------- Fall Risk Assessment Details Patient Name: [name], [name]. Date of Service: 07/03/2020 9:15 AM Medical Record Number: [id number] Patient Account Number: [id number] Date of Birth/Sex: 1946/02/07 (74 y.o. F) Treating RN: Army Melia Primary Care Cammeron Greis: [name] Other Clinician: Referring Marwah Disbro: [name] Treating Merced Brougham/Extender: [name] in Treatment: 0 Fall Risk Assessment Items Have you had 2 or more falls in the last 12 monthso 0 No Have you had any fall that resulted in injury in the last 12 monthso 0 No FALLS RISK SCREEN History of falling - immediate or within 3 months 0 No Secondary diagnosis (Do you have 2 or more medical diagnoseso) 0 No Ambulatory aid None/bed rest/wheelchair/nurse 0 No Crutches/cane/walker 0 No Furniture 0 No Intravenous therapy Access/Saline/Heparin Lock 0 No Gait/Transferring Normal/ bed rest/ wheelchair 0 No Weak (short steps with or without shuffle, stooped  but able to lift head while walking, may 0 No seek support from furniture) Impaired (short steps with shuffle, may have difficulty arising from chair, head down, impaired 0 No balance) Mental Status Oriented to own ability 0 No Electronic Signature(s) Signed: 07/03/2020 10:59:34 AM By: Army Melia Entered By: Army Melia on 07/03/2020 09:44:50 [name], [name]. ([id number]) -------------------------------------------------------------------------------- Foot Assessment Details Patient Name: [name], [name]. Date of Service: 07/03/2020 9:15 AM Medical Record Number: [id number] Patient Account Number: [id number] Date of Birth/Sex: Dec 10, 1945 (74 y.o. F) Treating RN: Army Melia Primary Care Torunn Chancellor: [name] Other Clinician: Referring Abid Bolla: [name] Treating Wateen Varon/Extender: [name] in Treatment: 0 Foot Assessment Items Site Locations + = Sensation present, - = Sensation absent, [name] = Callus, U = Ulcer R = Redness, W = Warmth, M = Maceration, PU = Pre-ulcerative lesion F = Fissure, S = Swelling, D = Dryness Assessment Right: Left: Other Deformity: No No Prior Foot Ulcer: No No Prior Amputation: No No Charcot Joint: No No Ambulatory Status:  Ambulatory Without Help Gait: Steady Electronic Signature(s) Signed: 07/03/2020 10:59:34 AM By: Army Melia Entered By: Army Melia on 07/03/2020 09:46:14 [name], Cindy CMarland Kitchen ([id number]) -------------------------------------------------------------------------------- Nutrition Risk Screening Details Patient Name: [name], [name]. Date of Service: 07/03/2020 9:15 AM Medical Record Number: [id number] Patient Account Number: [id number] Date of Birth/Sex: 09/21/1945 (74 y.o. F) Treating RN: Army Melia Primary Care Nolyn Eilert: [name] Other Clinician: Referring Derran Sear: [name] Treating Cutberto Winfree/Extender: [name] in Treatment: 0 Height (in): 61 Weight (lbs): 175 Body Mass Index (BMI): 33.1 Nutrition Risk Screening Items Score Screening NUTRITION RISK SCREEN: I have an illness or condition that made me change the kind and/or amount of food I eat 0 No I eat fewer than two meals per day 0 No I eat few fruits and vegetables, or milk products 0 No I have three or more drinks of beer, liquor or wine almost every day 0 No I have tooth or mouth problems that make it hard for me to eat 0 No I don't always have enough money to buy the food I need 0 No I eat alone most of the time 0 No I take three or more different prescribed or over-the-counter drugs a day 0 No Without wanting to, I have lost or gained 10 pounds in the last six months 0 No I am not always physically able to shop, cook and/or feed myself 0 No Nutrition Protocols Good Risk Protocol 0 No interventions needed Moderate Risk Protocol High Risk Proctocol Risk Level: Good Risk Score: 0 Electronic Signature(s) Signed: 07/03/2020 10:59:34 AM By: Army Melia Entered By: Army Melia on 07/03/2020 09:44:55

## 2020-07-03 NOTE — Assessment & Plan Note (Signed)
I will refer the patient to GI for evaluation

## 2020-07-03 NOTE — Progress Notes (Addendum)
[name] ([id number]) Visit Report for 07/03/2020 Chief Complaint Document Details Patient Name: [name], [name] Date of Service: 07/03/2020 9:15 AM Medical Record Number: [id number] Patient Account Number: [id number] Date of Birth/Sex: [birth year] (74 y.o. F) Treating RN: Cornell Barman Primary Care Provider: [name] Other Clinician: Referring Provider: [name] Treating Provider/Extender: [name] in Treatment: 0 Information Obtained from: Patient Chief Complaint Left LE Ulcer Electronic Signature(s) Signed: 07/03/2020 9:51:17 AM By: [name] Entered By: Worthy Keeler on 07/03/2020 09:51:16 Jobson, Lucelia CMarland Kitchen ([id number]) -------------------------------------------------------------------------------- Debridement Details Patient Name: Cindy Ohm C. Date of Service: 07/03/2020 9:15 AM Medical Record Number: [id number] Patient Account Number: [id number] Date of Birth/Sex: 03-23-1946 (74 y.o. F) Treating RN: Army Melia Primary Care Provider: [name] Other Clinician: Referring Provider: [name] Treating Provider/Extender: [name] in Treatment: 0 Debridement Performed for Wound #1 Left,Anterior Lower Leg Assessment: Performed By: Physician Tommie Sams., PA-C Debridement Type: Debridement Level of Consciousness (Pre- Awake and Alert procedure): Pre-procedure Verification/Time Out Yes - 08:50 Taken: Start Time: 09:51 Pain Control: Lidocaine Total Area Debrided (L x W): 1.3 (cm) x 1.5 (cm) = 1.95 (cm) Tissue and other material Non-Viable, Eschar, Slough, Subcutaneous, Slough debrided: Level: Skin/Subcutaneous Tissue Debridement Description: Excisional Instrument: Curette Bleeding: Minimum Hemostasis Achieved: Pressure End Time: 10:00 Response to Treatment: Procedure was tolerated well Level of Consciousness (Post- Awake and Alert procedure): Post Debridement Measurements of Total  Wound Length: (cm) 1.3 Width: (cm) 1.35 Depth: (cm) 0.4 Volume: (cm) 0.551 Character of Wound/Ulcer Post Debridement: Stable Post Procedure Diagnosis Same as Pre-procedure Electronic Signature(s) Signed: 07/03/2020 10:59:34 AM By: Army Melia Signed: 07/03/2020 4:50:08 PM By: [name] Entered By: Army Melia on 07/03/2020 10:04:34 Dines, Rozetta C. ([id number]) -------------------------------------------------------------------------------- HPI Details Patient Name: Cindy Loop, [name] Date of Service: 07/03/2020 9:15 AM Medical Record Number: [id number] Patient Account Number: [id number] Date of Birth/Sex: 1946/03/28 (74 y.o. F) Treating RN: Cornell Barman Primary Care Provider: [name] Other Clinician: Referring Provider: [name] Treating Provider/Extender: [name] in Treatment: 0 History of Present Illness HPI Description: 07/03/2020 upon evaluation today patient appears to be doing unfortunately somewhat poorly in regard to her leg where she struck this causing a wound 05/18/2020. She states that since that time she has had mainly an eschar covering sometimes it will drain sometimes not its been infected a couple times as well and she has been on several antibiotics including 5 days left of doxycycline that she is currently taking. No sharp debridement has been undertaken by anyone at this point. She tells me that she does have a history of leg swelling she is supposed to wear compression socks but she does not always do this. She also tells me that she does have hypertension but otherwise no major medical problems. She has not really been doing anything compression wise since this was injured in October. Electronic Signature(s) Signed: 07/03/2020 4:44:39 PM By: [name] Entered By: Worthy Keeler on 07/03/2020 16:44:39 Agner, Levana Loletha Grayer  ([id number]) -------------------------------------------------------------------------------- Physical Exam Details Patient Name: [name], [name] Date of Service: 07/03/2020 9:15 AM Medical Record Number: [id number] Patient Account Number: [id number] Date of Birth/Sex: 1945/10/08 (74 y.o. F) Treating RN: Cornell Barman Primary Care Provider: [name] Other Clinician: Referring Provider: [name] Treating Provider/Extender: [name] in Treatment: 0 Constitutional patient is hypertensive.. pulse regular and within target range for patient.Marland Kitchen respirations regular, non-labored and within target range for patient.Marland Kitchen temperature within target range for patient.. Well-nourished  and well-hydrated in no acute distress. Eyes conjunctiva clear no eyelid edema noted. pupils equal round and reactive to light and accommodation. Ears, Nose, Mouth, and Throat no gross abnormality of ear auricles or external auditory canals. normal hearing noted during conversation. mucus membranes moist. Respiratory normal breathing without difficulty. Cardiovascular 2+ dorsalis pedis/posterior tibialis pulses. 1+ pitting edema of the bilateral lower extremities. Musculoskeletal normal gait and posture. no significant deformity or arthritic changes, no loss or range of motion, no clubbing. Psychiatric this patient is able to make decisions and demonstrates good insight into disease process. Alert and Oriented x 3. pleasant and cooperative. Notes Upon inspection patient's wound bed actually showed signs of poor granulation at this time. There does not appear to be good granulation surface exposed. In fact I did have to perform sharp debridement and it does appear this was actually a hematoma that was collected underneath I did remove that today. Getting all the hematoma and dead tissue out of the wound did reveal some undermining but nonetheless I think this will actually heal much better and effectively  at this point. She may benefit from a snap VAC we will check into that but at this time I really think the big thing she needs is compression and with compression and appropriate management of the wound from a dressing standpoint she may not even end up needing the snap VAC to be honest. Electronic Signature(s) Signed: 07/03/2020 4:45:31 PM By: [name] Entered By: Worthy Keeler on 07/03/2020 16:45:31 Minder, Melenda Loletha Grayer ([id number]) -------------------------------------------------------------------------------- Physician Orders Details Patient Name: Cindy Loop, Evva C. Date of Service: 07/03/2020 9:15 AM Medical Record Number: [id number] Patient Account Number: [id number] Date of Birth/Sex: [birth year] (74 y.o. F) Treating RN: Army Melia Primary Care Provider: [name] Other Clinician: Referring Provider: [name] Treating Provider/Extender: [name] in Treatment: 0 Verbal / Phone Orders: No Diagnosis Coding ICD-10 Coding Code Description I87.2 Venous insufficiency (chronic) (peripheral) S81.802A Unspecified open wound, left lower leg, initial encounter L97.822 Non-pressure chronic ulcer of other part of left lower leg with fat layer exposed I10 Essential (primary) hypertension Wound Cleansing Wound #1 Left,Anterior Lower Leg o Clean wound with Normal Saline. o May shower with protection. - DO NOT GET WRAP WET! Primary Wound Dressing Wound #1 Left,Anterior Lower Leg o Silver Alginate Secondary Dressing Wound #1 Left,Anterior Lower Leg o ABD pad Dressing Change Frequency Wound #1 Left,Anterior Lower Leg o Once every week Follow-up Appointments Wound #1 Left,Anterior Lower Leg o Return Appointment in 1 week. o Nurse Visit as needed Edema Control Wound #1 Left,Anterior Lower Leg o 3 Layer Compression System - Left Lower Extremity Patient Medications Allergies: penicillin, latex Notifications Medication Indication Start  End gentamicin 07/07/2020 DOSE topical 0.1 % cream - cream topical applied in a thin film to the wound bed 3 times per week until healed under the dressing. Electronic Signature(s) Signed: 07/07/2020 9:20:41 AM By: [name] Previous Signature: 07/03/2020 10:59:34 AM Version By: Army Melia Previous Signature: 07/03/2020 4:50:08 PM Version By: [name] Entered By: Worthy Keeler on 07/07/2020 09:20:40 Haft, ARYIANA KLINKNER ([id number]) Renfrow, Antonella C. ([id number]) -------------------------------------------------------------------------------- Problem List Details Patient Name: Cindy Loop, Demetri C. Date of Service: 07/03/2020 9:15 AM Medical Record Number: [id number] Patient Account Number: [id number] Date of Birth/Sex: 1946-06-08 (74 y.o. F) Treating RN: Cornell Barman Primary Care Provider: [name] Other Clinician: Referring Provider: [name] Treating Provider/Extender: [name] in Treatment: 0 Active Problems ICD-10 Encounter Code Description Active Date MDM  Diagnosis I87.2 Venous insufficiency (chronic) (peripheral) 07/03/2020 No Yes S81.802A Unspecified open wound, left lower leg, initial encounter 07/03/2020 No Yes L97.822 Non-pressure chronic ulcer of other part of left lower leg with fat layer 07/03/2020 No Yes exposed [place] (primary) hypertension 07/03/2020 No Yes Inactive Problems Resolved Problems Electronic Signature(s) Signed: 07/03/2020 9:50:53 AM By: [name] Entered By: Worthy Keeler on 07/03/2020 09:50:53 Scarberry, Sharday C. ([id number]) -------------------------------------------------------------------------------- Progress Note Details Patient Name: Badalamenti, Cadey C. Date of Service: 07/03/2020 9:15 AM Medical Record Number: [id number] Patient Account Number: [id number] Date of Birth/Sex: 25-Nov-1945 (74 y.o. F) Treating RN: Cornell Barman Primary Care Provider: [name] Other  Clinician: Referring Provider: [name] Treating Provider/Extender: [name] in Treatment: 0 Subjective Chief Complaint Information obtained from Patient Left LE Ulcer History of Present Illness (HPI) 07/03/2020 upon evaluation today patient appears to be doing unfortunately somewhat poorly in regard to her leg where she struck this causing a wound 05/18/2020. She states that since that time she has had mainly an eschar covering sometimes it will drain sometimes not its been infected a couple times as well and she has been on several antibiotics including 5 days left of doxycycline that she is currently taking. No sharp debridement has been undertaken by anyone at this point. She tells me that she does have a history of leg swelling she is supposed to wear compression socks but she does not always do this. She also tells me that she does have hypertension but otherwise no major medical problems. She has not really been doing anything compression wise since this was injured in October. Patient History Information obtained from Patient. Allergies penicillin (Severity: Severe, Reaction: hives) Family History Cancer - Father, Heart Disease - Mother,Father, Hypertension - Mother, Lung Disease - Siblings, No family history of Diabetes, Hereditary Spherocytosis, Kidney Disease, Seizures, Stroke, Thyroid Problems, Tuberculosis. Social History Former smoker - 37 years ago for 15 years, Marital Status - Married, Alcohol Use - Never, Drug Use - No History, Caffeine Use - Daily - coffee. Medical History Eyes Patient has history of Cataracts Denies history of Glaucoma, Optic Neuritis Ear/Nose/Mouth/Throat Denies history of Chronic sinus problems/congestion, Middle ear problems Hematologic/Lymphatic Denies history of Anemia, Hemophilia, Human Immunodeficiency Virus, Lymphedema, Sickle Cell Disease Respiratory Patient has history of Asthma Denies history of Aspiration, Chronic  Obstructive Pulmonary Disease (COPD), Pneumothorax, Sleep Apnea, Tuberculosis Cardiovascular Patient has history of Hypertension Denies history of Angina, Arrhythmia, Congestive Heart Failure, Coronary Artery Disease, Deep Vein Thrombosis, Hypotension, Myocardial Infarction, Peripheral Arterial Disease, Peripheral Venous Disease, Phlebitis, Vasculitis Gastrointestinal Denies history of Cirrhosis , Colitis, Crohn s, Hepatitis A, Hepatitis B, Hepatitis C Endocrine Denies history of Type I Diabetes, Type II Diabetes Genitourinary Denies history of End Stage Renal Disease Immunological Denies history of Lupus Erythematosus, Raynaud s, Scleroderma Integumentary (Skin) Denies history of History of Burn, History of pressure wounds Musculoskeletal Patient has history of Osteoarthritis Denies history of Gout, Rheumatoid Arthritis, Osteomyelitis Neurologic Denies history of Dementia, Neuropathy, Quadriplegia, Paraplegia, Seizure Disorder Oncologic Patient has history of Received Radiation Denies history of Received Chemotherapy Psychiatric Denies history of Anorexia/bulimia, Confinement Anxiety Review of Systems (ROS) Constitutional Symptoms (General Health) Bagshaw, Terrionna C. ([id number]) Denies complaints or symptoms of Fatigue, Fever, Chills, Marked Weight Change. Eyes Denies complaints or symptoms of Dry Eyes, Vision Changes, Glasses / Contacts. Ear/Nose/Mouth/Throat Denies complaints or symptoms of Difficult clearing ears, Sinusitis. Hematologic/Lymphatic Denies complaints or symptoms of Bleeding / Clotting Disorders, Human Immunodeficiency Virus. Respiratory Denies complaints or symptoms of  Chronic or frequent coughs, Shortness of Breath. Cardiovascular Denies complaints or symptoms of Chest pain, LE edema. Gastrointestinal Denies complaints or symptoms of Frequent diarrhea, Nausea, Vomiting. Endocrine Denies complaints or symptoms of Hepatitis, Thyroid disease, Polydypsia  (Excessive Thirst). Genitourinary Denies complaints or symptoms of Kidney failure/ Dialysis, Incontinence/dribbling. Immunological Denies complaints or symptoms of Hives, Itching. Integumentary (Skin) Complains or has symptoms of Wounds. Denies complaints or symptoms of Bleeding or bruising tendency, Breakdown, Swelling. Musculoskeletal Denies complaints or symptoms of Muscle Pain, Muscle Weakness. Neurologic Denies complaints or symptoms of Numbness/parasthesias, Focal/Weakness. Oncologic Thyroid cancer Psychiatric Denies complaints or symptoms of Anxiety, Claustrophobia. Objective Constitutional patient is hypertensive.. pulse regular and within target range for patient.Marland Kitchen respirations regular, non-labored and within target range for patient.Marland Kitchen temperature within target range for patient.. Well-nourished and well-hydrated in no acute distress. Vitals Time Taken: 9:20 AM, Height: 61 in, Source: Stated, Weight: 175 lbs, Source: Stated, BMI: 33.1, Temperature: 97.8 F, Pulse: 77 bpm, Respiratory Rate: 16 breaths/min, Blood Pressure: 149/87 mmHg. Eyes conjunctiva clear no eyelid edema noted. pupils equal round and reactive to light and accommodation. Ears, Nose, Mouth, and Throat no gross abnormality of ear auricles or external auditory canals. normal hearing noted during conversation. mucus membranes moist. Respiratory normal breathing without difficulty. Cardiovascular 2+ dorsalis pedis/posterior tibialis pulses. 1+ pitting edema of the bilateral lower extremities. Musculoskeletal normal gait and posture. no significant deformity or arthritic changes, no loss or range of motion, no clubbing. Psychiatric this patient is able to make decisions and demonstrates good insight into disease process. Alert and Oriented x 3. pleasant and cooperative. General Notes: Upon inspection patient's wound bed actually showed signs of poor granulation at this time. There does not appear to be  good granulation surface exposed. In fact I did have to perform sharp debridement and it does appear this was actually a hematoma that was collected underneath I did remove that today. Getting all the hematoma and dead tissue out of the wound did reveal some undermining but nonetheless I think this will actually heal much better and effectively at this point. She may benefit from a snap VAC we will check into that but at this time I really think the big thing she needs is compression and with compression and appropriate management of the wound from a dressing standpoint she may not even end up needing the snap VAC to be honest. Integumentary (Hair, Skin) Wound #1 status is Open. Original cause of wound was Puncture. The wound is located on the Left,Anterior Lower Leg. The wound measures 1.3cm length x 1.5cm width x 0.1cm depth; 1.532cm^2 area and 0.153cm^3 volume. There is Fat Layer (Subcutaneous Tissue) exposed. There is no tunneling or undermining noted. There is a medium amount of serosanguineous drainage noted. The wound margin is thickened. There is Wyss, Jamie-Lee C. ([id number]) small (1-33%) red granulation within the wound bed. There is a large (67-100%) amount of necrotic tissue within the wound bed including Eschar. Assessment Active Problems ICD-10 Venous insufficiency (chronic) (peripheral) Unspecified open wound, left lower leg, initial encounter Non-pressure chronic ulcer of other part of left lower leg with fat layer exposed Essential (primary) hypertension Procedures Wound #1 Pre-procedure diagnosis of Wound #1 is a Skin Tear located on the Left,Anterior Lower Leg . There was a Excisional Skin/Subcutaneous Tissue Debridement with a total area of 1.95 sq cm performed by Tommie Sams., PA-C. With the following instrument(s): Curette to remove Non-Viable tissue/material. Material removed includes Eschar, Subcutaneous Tissue, and Slough after achieving pain control using  Lidocaine. A time out was conducted at 08:50, prior to the start of the procedure. A Minimum amount of bleeding was controlled with Pressure. The procedure was tolerated well. Post Debridement Measurements: 1.3cm length x 1.35cm width x 0.4cm depth; 0.551cm^3 volume. Character of Wound/Ulcer Post Debridement is stable. Post procedure Diagnosis Wound #1: Same as Pre-Procedure Pre-procedure diagnosis of Wound #1 is a Skin Tear located on the Left,Anterior Lower Leg . There was a Three Layer Compression Therapy Procedure by Army Melia, RN. Post procedure Diagnosis Wound #1: Same as Pre-Procedure Plan Wound Cleansing: Wound #1 Left,Anterior Lower Leg: Clean wound with Normal Saline. May shower with protection. - DO NOT GET WRAP WET! Primary Wound Dressing: Wound #1 Left,Anterior Lower Leg: Silver Alginate Secondary Dressing: Wound #1 Left,Anterior Lower Leg: ABD pad Dressing Change Frequency: Wound #1 Left,Anterior Lower Leg: Once every week Follow-up Appointments: Wound #1 Left,Anterior Lower Leg: Return Appointment in 1 week. Nurse Visit as needed Edema Control: Wound #1 Left,Anterior Lower Leg: 3 Layer Compression System - Left Lower Extremity The following medication(s) was prescribed: gentamicin topical 0.1 % cream cream topical applied in a thin film to the wound bed 3 times per week until healed under the dressing. starting 07/07/2020 1. I would recommend currently that we initiate treatment with a silver alginate dressing lightly tucked into the wound in order to help fill the space and allow this to hopefully granulate in and the edges of the wound to reattach where there is undermining. 2. I am also can recommend at this time that we have the patient continue to wear the 3 layer compression wrap over the next week. Fortunately Stober, Cherlyn C. ([id number]) I think this will keep the edema under control and hopefully should allow this to heal more effectively and  quickly. 3. I would also recommend she elevate her leg as much as possible to try to keep edema under good control. We will see patient back for reevaluation in 1 week here in the clinic. If anything worsens or changes patient will contact our office for additional recommendations. Electronic Signature(s) Signed: 07/07/2020 9:20:51 AM By: [name] Previous Signature: 07/03/2020 4:46:08 PM Version By: [name] Entered By: Worthy Keeler on 07/07/2020 09:20:50 Tschida, [name] ([id number]) -------------------------------------------------------------------------------- ROS/PFSH Details Patient Name: Cindy Ohm C. Date of Service: 07/03/2020 9:15 AM Medical Record Number: [id number] Patient Account Number: [id number] Date of Birth/Sex: 08-05-1945 (74 y.o. F) Treating RN: Army Melia Primary Care Provider: [name] Other Clinician: Referring Provider: [name] Treating Provider/Extender: [name] in Treatment: 0 Information Obtained From Patient Constitutional Symptoms (General Health) Complaints and Symptoms: Negative for: Fatigue; Fever; Chills; Marked Weight Change Eyes Complaints and Symptoms: Negative for: Dry Eyes; Vision Changes; Glasses / Contacts Medical History: Positive for: Cataracts Negative for: Glaucoma; Optic Neuritis Ear/Nose/Mouth/Throat Complaints and Symptoms: Negative for: Difficult clearing ears; Sinusitis Medical History: Negative for: Chronic sinus problems/congestion; Middle ear problems Hematologic/Lymphatic Complaints and Symptoms: Negative for: Bleeding / Clotting Disorders; Human Immunodeficiency Virus Medical History: Negative for: Anemia; Hemophilia; Human Immunodeficiency Virus; Lymphedema; Sickle Cell Disease Respiratory Complaints and Symptoms: Negative for: Chronic or frequent coughs; Shortness of Breath Medical History: Positive for: Asthma Negative for: Aspiration; Chronic Obstructive  Pulmonary Disease (COPD); Pneumothorax; Sleep Apnea; Tuberculosis Cardiovascular Complaints and Symptoms: Negative for: Chest pain; LE edema Medical History: Positive for: Hypertension Negative for: Angina; Arrhythmia; Congestive Heart Failure; Coronary Artery Disease; Deep Vein Thrombosis; Hypotension; Myocardial Infarction; Peripheral Arterial Disease; Peripheral Venous Disease; Phlebitis; Vasculitis Gastrointestinal Complaints  and Symptoms: Negative for: Frequent diarrhea; Nausea; Vomiting Medical History: Negative for: Cirrhosis ; Colitis; Crohnos; Hepatitis A; Hepatitis B; Hepatitis C Endocrine Placide, Bryli C. ([id number]) Complaints and Symptoms: Negative for: Hepatitis; Thyroid disease; Polydypsia (Excessive Thirst) Medical History: Negative for: Type I Diabetes; Type II Diabetes Genitourinary Complaints and Symptoms: Negative for: Kidney failure/ Dialysis; Incontinence/dribbling Medical History: Negative for: End Stage Renal Disease Immunological Complaints and Symptoms: Negative for: Hives; Itching Medical History: Negative for: Lupus Erythematosus; Raynaudos; Scleroderma Integumentary (Skin) Complaints and Symptoms: Positive for: Wounds Negative for: Bleeding or bruising tendency; Breakdown; Swelling Medical History: Negative for: History of Burn; History of pressure wounds Musculoskeletal Complaints and Symptoms: Negative for: Muscle Pain; Muscle Weakness Medical History: Positive for: Osteoarthritis Negative for: Gout; Rheumatoid Arthritis; Osteomyelitis Neurologic Complaints and Symptoms: Negative for: Numbness/parasthesias; Focal/Weakness Medical History: Negative for: Dementia; Neuropathy; Quadriplegia; Paraplegia; Seizure Disorder Psychiatric Complaints and Symptoms: Negative for: Anxiety; Claustrophobia Medical History: Negative for: Anorexia/bulimia; Confinement Anxiety Oncologic Complaints and Symptoms: Review of System Notes: Thyroid  cancer Medical History: Positive for: Received Radiation Negative for: Received Chemotherapy HBO Extended History Items Eyes: Cataracts Renn, Kayona C. ([id number]) Immunizations Pneumococcal Vaccine: Received Pneumococcal Vaccination: No Implantable Devices None Family and Social History Cancer: Yes - Father; Diabetes: No; Heart Disease: Yes - Mother,Father; Hereditary Spherocytosis: No; Hypertension: Yes - Mother; Kidney Disease: No; Lung Disease: Yes - Siblings; Seizures: No; Stroke: No; Thyroid Problems: No; Tuberculosis: No; Former smoker - 37 years ago for 15 years; Marital Status - Married; Alcohol Use: Never; Drug Use: No History; Caffeine Use: Daily - coffee; Financial Concerns: No; Food, Clothing or Shelter Needs: No; Support System Lacking: No; Transportation Concerns: No Electronic Signature(s) Signed: 07/03/2020 10:59:34 AM By: Army Melia Signed: 07/03/2020 4:50:08 PM By: [name] Entered By: Army Melia on 07/03/2020 09:44:08 Gutzmer, Jlyn C. ([id number]) -------------------------------------------------------------------------------- SuperBill Details Patient Name: Cindy Loop, Tamra C. Date of Service: 07/03/2020 Medical Record Number: [id number] Patient Account Number: [id number] Date of Birth/Sex: 07-Apr-1946 (74 y.o. F) Treating RN: Cornell Barman Primary Care Provider: [name] Other Clinician: Referring Provider: [name] Treating Provider/Extender: [name] in Treatment: 0 Diagnosis Coding ICD-10 Codes Code Description I87.2 Venous insufficiency (chronic) (peripheral) S81.802A Unspecified open wound, left lower leg, initial encounter L97.822 Non-pressure chronic ulcer of other part of left lower leg with fat layer exposed [place] (primary) hypertension Facility Procedures CPT4 Code: 62952841 Description: 32440 - WOUND CARE VISIT-LEV 3 EST PT Modifier: Quantity: 1 CPT4 Code: 10272536 Description: 64403 - DEB  SUBQ TISSUE 20 SQ CM/< Modifier: Quantity: 1 CPT4 Code: Description: ICD-10 Diagnosis Description L97.822 Non-pressure chronic ulcer of other part of left lower leg with fat layer Modifier: exposed Quantity: Physician Procedures CPT4 Code: 4742595 Description: WC PHYS LEVEL 3 o NEW PT Modifier: 25 Quantity: 1 CPT4 Code: Description: ICD-10 Diagnosis Description I87.2 Venous insufficiency (chronic) (peripheral) S81.802A Unspecified open wound, left lower leg, initial encounter 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

## 2020-07-03 NOTE — Assessment & Plan Note (Signed)
Patient has a swelling of the both leg due to varicose veins. left leg has a scab formation which is healing slowly.  I advised her to use Ace bandages.  She will be referred to vascular specialist as well as wound care

## 2020-07-03 NOTE — Assessment & Plan Note (Signed)
Patient is being followed up by nephrologist.

## 2020-07-03 NOTE — Assessment & Plan Note (Signed)
Blood pressure is stable on current medical regime

## 2020-07-03 NOTE — Progress Notes (Signed)
Cindy, [name] ([id number]) Visit Report for 07/03/2020 Allergy List Details Patient Name: [name], [name] Date of Service: 07/03/2020 9:15 AM Medical Record Number: [id number] Patient Account Number: [id number] Date of Birth/Sex: [birth year] (74 y.o. F) Treating RN: Army Melia Primary Care Jahmir Salo: [name] Other Clinician: Referring Berlene Dixson: [name] Treating Charika Mikelson/Extender: Jeri Cos Weeks in Treatment: 0 Allergies Active Allergies penicillin Reaction: hives Severity: Severe Allergy Notes Electronic Signature(s) Signed: 07/03/2020 10:59:34 AM By: Army Melia Entered By: Army Melia on 07/03/2020 09:38:39 Shakir, Yukiko Loletha Grayer ([id number]) -------------------------------------------------------------------------------- Arrival Information Details Patient Name: [name], [name] Date of Service: 07/03/2020 9:15 AM Medical Record Number: [id number] Patient Account Number: [id number] Date of Birth/Sex: 05-06-1946 (74 y.o. F) Treating RN: Army Melia Primary Care Saylah Ketner: [name] Other Clinician: Referring Donnisha Besecker: [name] Treating Horace Lukas/Extender: [name] in Treatment: 0 Visit Information Patient Arrived: Ambulatory Arrival Time: 09:20 Accompanied By: self Transfer Assistance: None Patient Identification Verified: Yes Electronic Signature(s) Signed: 07/03/2020 10:59:34 AM By: Army Melia Entered By: Army Melia on 07/03/2020 09:20:46 Greis, Emberlee Loletha Grayer ([id number]) -------------------------------------------------------------------------------- Clinic Level of Care Assessment Details Patient Name: Merlo, [name] Date of Service: 07/03/2020 9:15 AM Medical Record Number: [id number] Patient Account Number: [id number] Date of Birth/Sex: 10/26/1945 (74 y.o. F) Treating RN: Army Melia Primary Care Faaris Arizpe: [name] Other Clinician: Referring Hosie Sharman: [name] Treating Kiyoko Mcguirt/Extender: [name] in Treatment: 0 Clinic Level of Care Assessment Items TOOL 1 Quantity Score []  - Use when EandM and Procedure is performed on INITIAL visit 0 ASSESSMENTS - Nursing Assessment / Reassessment X - General Physical Exam (combine w/ comprehensive assessment (listed just below) when performed on new 1 20 pt. evals) X- 1 25 Comprehensive Assessment (HX, ROS, Risk Assessments, Wounds Hx, etc.) ASSESSMENTS - Wound and Skin Assessment / Reassessment []  - Dermatologic / Skin Assessment (not related to wound area) 0 ASSESSMENTS - Ostomy and/or Continence Assessment and Care []  - Incontinence Assessment and Management 0 []  - 0 Ostomy Care Assessment and Management (repouching, etc.) PROCESS - Coordination of Care X - Simple Patient / Family Education for ongoing care 1 15 []  - 0 Complex (extensive) Patient / Family Education for ongoing care X- 1 10 Staff obtains Programmer, systems, Records, Test Results / Process Orders []  - 0 Staff telephones HHA, Nursing Homes / Clarify orders / etc []  - 0 Routine Transfer to another Facility (non-emergent condition) []  - 0 Routine Hospital Admission (non-emergent condition) X- 1 15 New Admissions / Biomedical engineer / Ordering NPWT, Apligraf, etc. []  - 0 Emergency Hospital Admission (emergent condition) PROCESS - Special Needs []  - Pediatric / Minor Patient Management 0 []  - 0 Isolation Patient Management []  - 0 Hearing / Language / Visual special needs []  - 0 Assessment of Community assistance (transportation, D/C planning, etc.) []  - 0 Additional assistance / Altered mentation []  - 0 Support Surface(s) Assessment (bed, cushion, seat, etc.) INTERVENTIONS - Miscellaneous []  - External ear exam 0 []  - 0 Patient Transfer (multiple staff / Civil Service fast streamer / Similar devices) []  - 0 Simple Staple / Suture removal (25 or less) []  - 0 Complex Staple / Suture removal (26 or more) []  - 0 Hypo/Hyperglycemic Management (do not check if billed  separately) []  - 0 Ankle / Brachial Index (ABI) - do not check if billed separately Has the patient been seen at the hospital within the last three years: Yes Total Score: 85 Level Of Care: New/Established - Level 3 Rettinger, Dawnya C. ([id number]) Electronic Signature(s) Signed: 07/03/2020 10:59:34 AM By:  Nicki Reaper, Dajea Entered By: Army Melia on 07/03/2020 10:07:14 [name] ([id number]) -------------------------------------------------------------------------------- Compression Therapy Details Patient Name: FRARY, [name] Date of Service: 07/03/2020 9:15 AM Medical Record Number: [id number] Patient Account Number: [id number] Date of Birth/Sex: 02/16/1946 (74 y.o. F) Treating RN: Army Melia Primary Care Kanyah Matsushima: [name] Other Clinician: Referring Jayleah Garbers: [name] Treating Tacori Kvamme/Extender: [name] in Treatment: 0 Compression Therapy Performed for Wound Assessment: Wound #1 Left,Anterior Lower Leg Performed By: Clinician Army Melia, RN Compression Type: Three Layer Post Procedure Diagnosis Same as Pre-procedure Electronic Signature(s) Signed: 07/03/2020 10:59:34 AM By: Army Melia Entered By: Army Melia on 07/03/2020 10:04:44 Julian, Tiffay CMarland Kitchen ([id number]) -------------------------------------------------------------------------------- Encounter Discharge Information Details Patient Name: [name], [name] Date of Service: 07/03/2020 9:15 AM Medical Record Number: [id number] Patient Account Number: [id number] Date of Birth/Sex: 1946/06/26 (74 y.o. F) Treating RN: Army Melia Primary Care Piercen Covino: [name] Other Clinician: Referring Karri Kallenbach: [name] Treating Akeisha Lagerquist/Extender: [name] in Treatment: 0 Encounter Discharge Information Items Post Procedure Vitals Discharge Condition: Stable Temperature (F): 97.8 Ambulatory Status: Ambulatory Pulse (bpm): 77 Discharge Destination: Home Respiratory  Rate (breaths/min): 16 Transportation: Private Auto Blood Pressure (mmHg): 149/87 Accompanied By: self Schedule Follow-up Appointment: Yes Clinical Summary of Care: Electronic Signature(s) Signed: 07/03/2020 10:59:34 AM By: Army Melia Entered By: Army Melia on 07/03/2020 10:08:06 Bethel, Lena C. ([id number]) -------------------------------------------------------------------------------- Lower Extremity Assessment Details Patient Name: Kautzman, Cele C. Date of Service: 07/03/2020 9:15 AM Medical Record Number: [id number] Patient Account Number: [id number] Date of Birth/Sex: [birth year] (74 y.o. F) Treating RN: Army Melia Primary Care Kyleigha Markert: [name] Other Clinician: Referring Gina Leblond: [name] Treating Arrington Bencomo/Extender: Jeri Cos Weeks in Treatment: 0 Edema Assessment Assessed: [Left: No] [Right: No] Edema: [Left: Yes] [Right: No] Calf Left: Right: Point of Measurement: 32 cm From Medial Instep 39 cm 28 cm Ankle Left: Right: Point of Measurement: 10 cm From Medial Instep 29 cm 25 cm Vascular Assessment Pulses: Dorsalis Pedis Palpable: [Left:Yes] [Right:Yes] Doppler Audible: [Left:Yes] Posterior Tibial Palpable: [Left:Yes] Blood Pressure: Brachial: [Left:141] [Right:120] Dorsalis Pedis: 150 Ankle: [Right:Posterior Tibial: 160] Posterior Tibial: 165 Ankle Brachial Index: [Left:1.17] [Right:1.13] Electronic Signature(s) Signed: 07/03/2020 10:59:34 AM By: Army Melia Entered By: Army Melia on 07/03/2020 09:38:10 Wonders, Donna C. ([id number]) -------------------------------------------------------------------------------- Multi Wound Chart Details Patient Name: [name], Laniece C. Date of Service: 07/03/2020 9:15 AM Medical Record Number: [id number] Patient Account Number: [id number] Date of Birth/Sex: 04/05/1946 (74 y.o. F) Treating RN: Army Melia Primary Care Dama Hedgepeth: [name] Other Clinician: Referring Brynli Ollis: [name] Treating Jayven Naill/Extender: [name] in Treatment: 0 Vital Signs Height(in): 61 Pulse(bpm): 38 Weight(lbs): 175 Blood Pressure(mmHg): 149/87 Body Mass Index(BMI): 33 Temperature(F): 97.8 Respiratory Rate(breaths/min): 16 Photos: [N/A:N/A] Wound Location: Left, Anterior Lower Leg N/A N/A Wounding Event: Puncture N/A N/A Primary Etiology: Skin Tear N/A N/A Date Acquired: 05/18/2020 N/A N/A Weeks of Treatment: 0 N/A N/A Wound Status: Open N/A N/A Measurements L x W x D (cm) 1.3x1.5x0.1 N/A N/A Area (cm) : 1.532 N/A N/A Volume (cm) : 0.153 N/A N/A Classification: Full Thickness Without Exposed N/A N/A Support Structures Exudate Amount: Medium N/A N/A Exudate Type: Serosanguineous N/A N/A Exudate Color: red, brown N/A N/A Wound Margin: Thickened N/A N/A Granulation Amount: Small (1-33%) N/A N/A Granulation Quality: Red N/A N/A Necrotic Amount: Large (67-100%) N/A N/A Necrotic Tissue: Eschar N/A N/A Exposed Structures: Fat Layer (Subcutaneous Tissue): N/A N/A Yes Fascia: No Tendon: No Muscle: No Joint: No Bone: No Epithelialization: None N/A N/A Treatment Notes Electronic Signature(s) Signed: 07/03/2020 10:59:34 AM  By: Army Melia Entered By: Army Melia on 07/03/2020 09:54:13 Corniel, [name] ([id number]) -------------------------------------------------------------------------------- Multi-Disciplinary Care Plan Details Patient Name: [name], Sibley C. Date of Service: 07/03/2020 9:15 AM Medical Record Number: [id number] Patient Account Number: [id number] Date of Birth/Sex: 04-05-1946 (74 y.o. F) Treating RN: Army Melia Primary Care Amanpreet Delmont: [name] Other Clinician: Referring Natale Barba: [name] Treating Darinda [place]/Extender: [name] in Treatment: 0 Active Inactive Orientation to the Wound Care Program Nursing Diagnoses: Knowledge deficit related to the wound healing center program Goals: Patient/caregiver will  verbalize understanding of the [facility] Program Date Initiated: 07/03/2020 Target Resolution Date: 07/31/2020 Goal Status: Active Interventions: Provide education on orientation to the wound center Notes: Wound/Skin Impairment Nursing Diagnoses: Impaired tissue integrity Goals: Ulcer/skin breakdown will have a volume reduction of 30% by week 4 Date Initiated: 07/03/2020 Target Resolution Date: 07/31/2020 Goal Status: Active Interventions: Assess ulceration(s) every visit Notes: Electronic Signature(s) Signed: 07/03/2020 10:59:34 AM By: Army Melia Entered By: Army Melia on 07/03/2020 09:53:57 Kysar, Kahmya C. ([id number]) -------------------------------------------------------------------------------- Pain Assessment Details Patient Name: [name], Rasha C. Date of Service: 07/03/2020 9:15 AM Medical Record Number: [id number] Patient Account Number: [id number] Date of Birth/Sex: 20-Jan-1946 (74 y.o. F) Treating RN: Army Melia Primary Care Arlando Leisinger: [name] Other Clinician: Referring Raffaela Ladley: [name] Treating Delaine Hernandez/Extender: [name] in Treatment: 0 Active Problems Location of Pain Severity and Description of Pain Patient Has Paino Yes Site Locations Pain Location: Pain in Ulcers Rate the pain. Current Pain Level: 3 Pain Management and Medication Current Pain Management: Electronic Signature(s) Signed: 07/03/2020 10:59:34 AM By: Army Melia Entered By: Army Melia on 07/03/2020 09:22:48 Wollenberg, [name] ([id number]) -------------------------------------------------------------------------------- Patient/Caregiver Education Details Patient Name: [name], Treana C. Date of Service: 07/03/2020 9:15 AM Medical Record Number: [id number] Patient Account Number: [id number] Date of Birth/Gender: 20-May-1946 (74 y.o. F) Treating RN: Army Melia Primary Care Physician: [name] Other Clinician: Referring Physician: [name] Treating Physician/Extender: [name] in Treatment: 0 Education Assessment Education Provided To: Patient Education Topics Provided Wound/Skin Impairment: Handouts: Caring for Your Ulcer Methods: Demonstration, Explain/Verbal Responses: State content correctly Electronic Signature(s) Signed: 07/03/2020 10:59:34 AM By: Army Melia Entered By: Army Melia on 07/03/2020 10:07:25 Swallows, Dwight C. ([id number]) -------------------------------------------------------------------------------- Wound Assessment Details Patient Name: Mcmichen, Twanisha C. Date of Service: 07/03/2020 9:15 AM Medical Record Number: [id number] Patient Account Number: [id number] Date of Birth/Sex: Apr 06, 1946 (74 y.o. F) Treating RN: Army Melia Primary Care Glendel Jaggers: [name] Other Clinician: Referring Aseneth Hack: [name] Treating Waver Dibiasio/Extender: [name] in Treatment: 0 Wound Status Wound Number: 1 Primary Etiology: Skin Tear Wound Location: Left, Anterior Lower Leg Wound Status: Open Wounding Event: Puncture Date Acquired: 05/18/2020 Weeks Of Treatment: 0 Clustered Wound: No Photos Wound Measurements Length: (cm) 1.3 Width: (cm) 1.5 Depth: (cm) 0.1 Area: (cm) 1.532 Volume: (cm) 0.153 % Reduction in Area: % Reduction in Volume: Epithelialization: None Tunneling: No Undermining: No Wound Description Classification: Full Thickness Without Exposed Support Structu Wound Margin: Thickened Exudate Amount: Medium Exudate Type: Serosanguineous Exudate Color: red, brown res Foul Odor After Cleansing: No Slough/Fibrino Yes Wound Bed Granulation Amount: Small (1-33%) Exposed Structure Granulation Quality: Red Fascia Exposed: No Necrotic Amount: Large (67-100%) Fat Layer (Subcutaneous Tissue) Exposed: Yes Necrotic Quality: Eschar Tendon Exposed: No Muscle Exposed: No Joint Exposed: No Bone Exposed: No Treatment Notes Wound #1 (Left, Anterior Lower  Leg) Notes SCELL, ABD, 3 LAYER Electronic Signature(s) Signed: 07/03/2020 10:59:34 AM By: Dianna Limbo, Athena C. ([id number]) Entered By: Army Melia on 07/03/2020 09:26:56 Peach,  Nuvia C. ([id number]) -------------------------------------------------------------------------------- Vitals Details Patient Name: Hardacre, Iyari C. Date of Service: 07/03/2020 9:15 AM Medical Record Number: [id number] Patient Account Number: [id number] Date of Birth/Sex: August 10, 1945 (74 y.o. F) Treating RN: Army Melia Primary Care Julann Mcgilvray: [name] Other Clinician: Referring Leonore Frankson: [name] Treating Doneshia Hill/Extender: [name] in Treatment: 0 Vital Signs Time Taken: 09:20 Temperature (F): 97.8 Height (in): 61 Pulse (bpm): 77 Source: Stated Respiratory Rate (breaths/min): 16 Weight (lbs): 175 Blood Pressure (mmHg): 149/87 Source: Stated Reference Range: 80 - 120 mg / dl Body Mass Index (BMI): 33.1 Electronic Signature(s) Signed: 07/03/2020 10:59:34 AM By: Army Melia Entered By: Army Melia on 07/03/2020 09:23:32

## 2020-07-03 NOTE — Assessment & Plan Note (Signed)
Cellulitis of the left leg is getting better. 

## 2020-07-06 ENCOUNTER — Other Ambulatory Visit: Payer: Self-pay

## 2020-07-06 ENCOUNTER — Other Ambulatory Visit
Admission: RE | Admit: 2020-07-06 | Discharge: 2020-07-06 | Disposition: A | Discharge status: Home / Self Care | Payer: Medicare Other | Source: Ambulatory Visit | Attending: Physician Assistant | Admitting: Physician Assistant

## 2020-07-06 DIAGNOSIS — I10 Essential (primary) hypertension: Secondary | ICD-10-CM | POA: Diagnosis not present

## 2020-07-06 DIAGNOSIS — L089 Local infection of the skin and subcutaneous tissue, unspecified: Secondary | ICD-10-CM | POA: Insufficient documentation

## 2020-07-06 DIAGNOSIS — I872 Venous insufficiency (chronic) (peripheral): Secondary | ICD-10-CM | POA: Diagnosis not present

## 2020-07-06 DIAGNOSIS — L97822 Non-pressure chronic ulcer of other part of left lower leg with fat layer exposed: Secondary | ICD-10-CM | POA: Insufficient documentation

## 2020-07-08 LAB — AEROBIC CULTURE W GRAM STAIN (SUPERFICIAL SPECIMEN)

## 2020-07-08 NOTE — Progress Notes (Addendum)
MEADOW, ABRAMO ([id number]) Visit Report for 07/06/2020 Physician Orders Details Patient Name: [name], [name]. Date of Service: 07/06/2020 11:00 AM Medical Record Number: [id number] Patient Account Number: [id number] Date of Birth/Sex: Jan 07, 1946 (74 y.o. F) Treating RN: [name] Primary Care Provider: [name] Other Clinician: Referring Provider: [name] Treating Provider/Extender: [name] in Treatment: 0 Verbal / Phone Orders: No Diagnosis Coding Wound Cleansing Wound #1 Left,Anterior Lower Leg o Clean wound with Normal Saline. Anesthetic (add to Medication List) Wound #1 Left,Anterior Lower Leg o Topical Lidocaine 4% cream applied to wound bed prior to debridement (In Clinic Only). Skin Barriers/Peri-Wound Care Wound #1 Left,Anterior Lower Leg o Moisturizing lotion Primary Wound Dressing Wound #1 Left,Anterior Lower Leg o Gentamicin Sulfate Cream o Silver Alginate Secondary Dressing Wound #1 Left,Anterior Lower Leg o ABD and Kerlix/Conform Follow-up Appointments Wound #1 Left,Anterior Lower Leg o Return Appointment in: - Friday o Nurse Visit as needed Edema Control Wound #1 Left,Anterior Lower Leg o Other: - tubi grip double layer E Laboratory o Bacteria identified in Wound by Culture (MICRO) oooo LOINC Code: 1749-4 oooo Convenience Name: Wound culture routine Electronic Signature(s) Signed: 07/07/2020 4:19:35 PM By: [name] Signed: 07/08/2020 9:09:11 AM By: [name], [name] Entered By: [name], Minus Breeding on 07/06/2020 11:46:33 [place], [place]. ([id number]) -------------------------------------------------------------------------------- [place] Details Patient Name: [name], [name] Date of Service: 07/06/2020 Medical Record Number: [id number] Patient Account Number: [id number] Date of Birth/Sex: 11-02-1945 (74 y.o. F) Treating RN: [name] Primary Care Provider:  [name] Other Clinician: Referring Provider: [name] Treating Provider/Extender: [name] in Treatment: 0 Diagnosis Coding ICD-10 Codes Code Description I87.2 Venous insufficiency (chronic) (peripheral) S81.802A Unspecified open wound, left lower leg, initial encounter L97.822 Non-pressure chronic ulcer of other part of left lower leg with fat layer exposed [place] (primary) hypertension Facility Procedures CPT4 Code: 70177939 Description: 03009 - WOUND CARE VISIT-LEV 2 EST PT Modifier: Quantity: 1 Electronic Signature(s) Signed: 07/07/2020 4:19:35 PM By: [name] Signed: 07/08/2020 9:09:11 AM By: [name], [name] Entered By: [name], Minus Breeding on 07/06/2020 11:48:48

## 2020-07-08 NOTE — Progress Notes (Signed)
TANNIS, BURSTEIN ([id number]) Visit Report for 07/06/2020 Allergy List Details Patient Name: [name], [name] Date of Service: 07/06/2020 11:00 AM Medical Record Number: [id number] Patient Account Number: [id number] Date of Birth/Sex: [birth year] (74 y.o. F) Treating RN: [name] Primary Care Lyndol Vanderheiden: [name] Other Clinician: Referring Hank Walling: [name] Treating Jedadiah Abdallah/Extender: Jeri Cos Weeks in Treatment: 0 Allergies Active Allergies penicillin Reaction: hives Severity: Severe latex Allergy Notes Electronic Signature(s) Signed: 07/07/2020 8:27:08 AM By: [name], [name] Entered By: [name], Minus Breeding on 07/07/2020 08:27:03 [name] ([id number]) -------------------------------------------------------------------------------- Arrival Information Details Patient Name: Cindy Ohm C. Date of Service: 07/06/2020 11:00 AM Medical Record Number: [id number] Patient Account Number: [id number] Date of Birth/Sex: [birth year] (74 y.o. F) Treating RN: [name] Primary Care Zeyad Delaguila: [name] Other Clinician: Referring Aero Drummonds: [name] Treating Ronnel Zuercher/Extender: [name] in Treatment: 0 Visit Information Patient Arrived: Ambulatory Arrival Time: 11:13 Accompanied By: self Transfer Assistance: None Patient Identification Verified: Yes Secondary Verification Process Completed: Yes History Since Last Visit Electronic Signature(s) Signed: 07/08/2020 9:09:11 AM By: [name], [name] Entered By: [name], Minus Breeding on 07/06/2020 11:17:23 Kuper, Emmalena Loletha Grayer ([id number]) -------------------------------------------------------------------------------- Clinic Level of Care Assessment Details Patient Name: [name], [name] Date of Service: 07/06/2020 11:00 AM Medical Record Number: [id number] Patient Account Number: [id number] Date of Birth/Sex: Feb 03, 1946 (74 y.o. F) Treating RN: [name] Primary Care Sherley Leser: [name] Other Clinician: Referring Kaleem Sartwell: [name] Treating Marca Gadsby/Extender: [name] in Treatment: 0 Clinic Level of Care Assessment Items TOOL 4 Quantity Score X - Use when only an EandM is performed on FOLLOW-UP visit 1 0 ASSESSMENTS - Nursing Assessment / Reassessment []  - Reassessment of Co-morbidities (includes updates in patient status) 0 []  - 0 Reassessment of Adherence to Treatment Plan ASSESSMENTS - Wound and Skin Assessment / Reassessment X - Simple Wound Assessment / Reassessment - one wound 1 5 []  - 0 Complex Wound Assessment / Reassessment - multiple wounds []  - 0 Dermatologic / Skin Assessment (not related to wound area) ASSESSMENTS - Focused Assessment []  - Circumferential Edema Measurements - multi extremities 0 []  - 0 Nutritional Assessment / Counseling / Intervention []  - 0 Lower Extremity Assessment (monofilament, tuning fork, pulses) []  - 0 Peripheral Arterial Disease Assessment (using hand held doppler) ASSESSMENTS - Ostomy and/or Continence Assessment and Care []  - Incontinence Assessment and Management 0 []  - 0 Ostomy Care Assessment and Management (repouching, etc.) PROCESS - Coordination of Care X - Simple Patient / Family Education for ongoing care 1 15 []  - 0 Complex (extensive) Patient / Family Education for ongoing care []  - 0 Staff obtains Programmer, systems, Records, Test Results / Process Orders []  - 0 Staff telephones HHA, Nursing Homes / Clarify orders / etc []  - 0 Routine Transfer to another Facility (non-emergent condition) []  - 0 Routine Hospital Admission (non-emergent condition) []  - 0 New Admissions / Biomedical engineer / Ordering NPWT, Apligraf, etc. []  - 0 Emergency Hospital Admission (emergent condition) X- 1 10 Simple Discharge Coordination []  - 0 Complex (extensive) Discharge Coordination PROCESS - Special Needs []  - Pediatric / Minor Patient Management 0 []  -  0 Isolation Patient Management []  - 0 Hearing / Language / Visual special needs []  - 0 Assessment of Community assistance (transportation, D/C planning, etc.) []  - 0 Additional assistance / Altered mentation []  - 0 Support Surface(s) Assessment (bed, cushion, seat, etc.) INTERVENTIONS - Wound Cleansing / Measurement [name], [name] ([id number]) X- 1 5 Simple Wound Cleansing - one wound []  -  0 Complex Wound Cleansing - multiple wounds X- 1 5 Wound Imaging (photographs - any number of wounds) []  - 0 Wound Tracing (instead of photographs) X- 1 5 Simple Wound Measurement - one wound []  - 0 Complex Wound Measurement - multiple wounds INTERVENTIONS - Wound Dressings []  - Small Wound Dressing one or multiple wounds 0 X- 1 15 Medium Wound Dressing one or multiple wounds []  - 0 Large Wound Dressing one or multiple wounds X- 1 5 Application of Medications - topical []  - 0 Application of Medications - injection INTERVENTIONS - Miscellaneous []  - External ear exam 0 X- 1 5 Specimen Collection (cultures, biopsies, blood, body fluids, etc.) X- 1 5 Specimen(s) / Culture(s) sent or taken to Lab for analysis []  - 0 Patient Transfer (multiple staff / Civil Service fast streamer / Similar devices) []  - 0 Simple Staple / Suture removal (25 or less) []  - 0 Complex Staple / Suture removal (26 or more) []  - 0 Hypo / Hyperglycemic Management (close monitor of Blood Glucose) []  - 0 Ankle / Brachial Index (ABI) - do not check if billed separately []  - 0 Vital Signs Has the patient been seen at the hospital within the last three years: Yes Total Score: 75 Level Of Care: New/Established - Level 2 Electronic Signature(s) Signed: 07/08/2020 9:09:11 AM By: [name], [name] Entered By: [name], Kenia on 07/06/2020 11:48:38 [name], [name] Kitchen ([id number]) -------------------------------------------------------------------------------- Encounter Discharge Information Details Patient  Name: [name], [name] Date of Service: 07/06/2020 11:00 AM Medical Record Number: [id number] Patient Account Number: [id number] Date of Birth/Sex: 1946/07/05 (74 y.o. F) Treating RN: [name] Primary Care Deirdre Gryder: [name] Other Clinician: Referring Naoki Migliaccio: [name] Treating Ed Mandich/Extender: [name] in Treatment: 0 Encounter Discharge Information Items Discharge Condition: Stable Ambulatory Status: Ambulatory Discharge Destination: Home Transportation: Private Auto Accompanied By: self Schedule Follow-up Appointment: Yes Clinical Summary of Care: Electronic Signature(s) Signed: 07/08/2020 9:09:11 AM By: [name], [name] Entered By: [name], Minus Breeding on 07/06/2020 11:47:46 [name], [name] ([id number]) -------------------------------------------------------------------------------- Wound Assessment Details Patient Name: [name], [name] Date of Service: 07/06/2020 11:00 AM Medical Record Number: [id number] Patient Account Number: [id number] Date of Birth/Sex: [birth year] (74 y.o. F) Treating RN: [name] Primary Care Eria Lozoya: [name] Other Clinician: Referring Anea Fodera: [name] Treating Jenell Dobransky/Extender: [name] in Treatment: 0 Wound Status Wound Number: 1 Primary Skin Tear Etiology: Wound Location: Left, Anterior Lower Leg Wound Status: Open Wounding Event: Puncture Comorbid Cataracts, Asthma, Hypertension, Osteoarthritis, Date Acquired: 05/18/2020 History: Received Radiation Weeks Of Treatment: 0 Clustered Wound: No Photos Wound Measurements Length: (cm) 1.5 Width: (cm) 1.2 Depth: (cm) 0.4 Area: (cm) 1.414 Volume: (cm) 0.565 % Reduction in Area: 7.7% % Reduction in Volume: -269.3% Epithelialization: None Tunneling: No Undermining: Yes Starting Position (o'clock): 12 Ending Position (o'clock): 5 Maximum Distance: (cm) 3 Wound Description Classification: Full Thickness Without  Exposed Support Structu Wound Margin: Thickened Exudate Amount: Medium Exudate Type: Purulent Exudate Color: yellow, brown, green res Foul Odor After Cleansing: No Slough/Fibrino Yes Wound Bed Granulation Amount: Small (1-33%) Exposed Structure Granulation Quality: Hyper-granulation, Friable Fascia Exposed: No Necrotic Amount: Large (67-100%) Fat Layer (Subcutaneous Tissue) Exposed: Yes Necrotic Quality: Adherent Slough Tendon Exposed: No Muscle Exposed: No Joint Exposed: No Bone Exposed: No Treatment Notes Wound #1 (Left, Anterior Lower Leg) [name], [name] ([id number]) Notes SCELL, ABD, CONFORM, TUBI GRIP Electronic Signature(s) Signed: 07/07/2020 8:25:35 AM By: [name], [name] Entered By: [name], Minus Breeding on 07/07/2020 08:25:34

## 2020-07-09 ENCOUNTER — Other Ambulatory Visit: Payer: Self-pay | Admitting: Internal Medicine

## 2020-07-09 DIAGNOSIS — J301 Allergic rhinitis due to pollen: Secondary | ICD-10-CM | POA: Diagnosis not present

## 2020-07-09 DIAGNOSIS — J3089 Other allergic rhinitis: Secondary | ICD-10-CM | POA: Diagnosis not present

## 2020-07-09 DIAGNOSIS — B3741 Candidal cystitis and urethritis: Secondary | ICD-10-CM

## 2020-07-10 ENCOUNTER — Other Ambulatory Visit: Payer: Self-pay

## 2020-07-10 ENCOUNTER — Encounter: Payer: Medicare Other | Admitting: Physician Assistant

## 2020-07-10 DIAGNOSIS — L97822 Non-pressure chronic ulcer of other part of left lower leg with fat layer exposed: Secondary | ICD-10-CM | POA: Diagnosis not present

## 2020-07-10 DIAGNOSIS — I10 Essential (primary) hypertension: Secondary | ICD-10-CM | POA: Diagnosis not present

## 2020-07-10 DIAGNOSIS — I872 Venous insufficiency (chronic) (peripheral): Secondary | ICD-10-CM | POA: Diagnosis not present

## 2020-07-10 NOTE — Assessment & Plan Note (Signed)
Sleep hygiene was discussed with the patient.

## 2020-07-10 NOTE — Assessment & Plan Note (Signed)
Patient was referred to GI

## 2020-07-10 NOTE — Assessment & Plan Note (Signed)
Patient has cellulitis and an ulcer formation of the left leg.  Patient will be referred to the wound clinic for follow-up.

## 2020-07-10 NOTE — Progress Notes (Addendum)
Cindy, [name] ([id number]) Visit Report for 07/10/2020 Arrival Information Details Patient Name: [name], [name]. Date of Service: 07/10/2020 3:30 PM Medical Record Number: [id number] Patient Account Number: [id number] Date of Birth/Sex: 08-15-1945 (74 y.o. F) Treating RN: Cornell Barman Primary Care Arnol Mcgibbon: [name] Other Clinician: Referring Sadira Standard: [name] Treating Octivia Canion/Extender: [name] in Treatment: 1 Visit Information History Since Last Visit Pain Present Now: No Patient Arrived: Ambulatory Arrival Time: 15:35 Accompanied By: self Transfer Assistance: None Patient Identification Verified: Yes Secondary Verification Process Completed: Yes Electronic Signature(s) Signed: 07/10/2020 5:10:27 PM By: Gretta Cool, BSN, RN, CWS, Kim RN, BSN Entered By: Gretta Cool, BSN, RN, CWS, Kim on 07/10/2020 15:37:15 Schachter, [name] ([id number]) -------------------------------------------------------------------------------- Clinic Level of Care Assessment Details Patient Name: Cindy, [name] Date of Service: 07/10/2020 3:30 PM Medical Record Number: [id number] Patient Account Number: [id number] Date of Birth/Sex: 1946/01/08 (74 y.o. F) Treating RN: Cornell Barman Primary Care Dennie Vecchio: [name] Other Clinician: Referring Eniola Cerullo: [name] Treating Penny Arrambide/Extender: [name] in Treatment: 1 Clinic Level of Care Assessment Items TOOL 4 Quantity Score []  - Use when only an EandM is performed on FOLLOW-UP visit 0 ASSESSMENTS - Nursing Assessment / Reassessment []  - Reassessment of Co-morbidities (includes updates in patient status) 0 []  - 0 Reassessment of Adherence to Treatment Plan ASSESSMENTS - Wound and Skin Assessment / Reassessment X - Simple Wound Assessment / Reassessment - one wound 1 5 []  - 0 Complex Wound Assessment / Reassessment - multiple wounds []  - 0 Dermatologic / Skin Assessment (not related to wound area) ASSESSMENTS -  Focused Assessment []  - Circumferential Edema Measurements - multi extremities 0 []  - 0 Nutritional Assessment / Counseling / Intervention []  - 0 Lower Extremity Assessment (monofilament, tuning fork, pulses) []  - 0 Peripheral Arterial Disease Assessment (using hand held doppler) ASSESSMENTS - Ostomy and/or Continence Assessment and Care []  - Incontinence Assessment and Management 0 []  - 0 Ostomy Care Assessment and Management (repouching, etc.) PROCESS - Coordination of Care X - Simple Patient / Family Education for ongoing care 1 15 []  - 0 Complex (extensive) Patient / Family Education for ongoing care X- 1 10 Staff obtains Programmer, systems, Records, Test Results / Process Orders []  - 0 Staff telephones HHA, Nursing Homes / Clarify orders / etc []  - 0 Routine Transfer to another Facility (non-emergent condition) []  - 0 Routine Hospital Admission (non-emergent condition) []  - 0 New Admissions / Biomedical engineer / Ordering NPWT, Apligraf, etc. []  - 0 Emergency Hospital Admission (emergent condition) X- 1 10 Simple Discharge Coordination []  - 0 Complex (extensive) Discharge Coordination PROCESS - Special Needs []  - Pediatric / Minor Patient Management 0 []  - 0 Isolation Patient Management []  - 0 Hearing / Language / Visual special needs []  - 0 Assessment of Community assistance (transportation, D/C planning, etc.) []  - 0 Additional assistance / Altered mentation []  - 0 Support Surface(s) Assessment (bed, cushion, seat, etc.) INTERVENTIONS - Wound Cleansing / Measurement Rossa, Dezaree C. ([id number]) X- 1 5 Simple Wound Cleansing - one wound []  - 0 Complex Wound Cleansing - multiple wounds X- 1 5 Wound Imaging (photographs - any number of wounds) []  - 0 Wound Tracing (instead of photographs) X- 1 5 Simple Wound Measurement - one wound []  - 0 Complex Wound Measurement - multiple wounds INTERVENTIONS - Wound Dressings []  - Small Wound Dressing one or multiple  wounds 0 X- 1 15 Medium Wound Dressing one or multiple wounds []  - 0 Large Wound Dressing one or multiple wounds X-  1 5 Application of Medications - topical []  - 0 Application of Medications - injection INTERVENTIONS - Miscellaneous []  - External ear exam 0 []  - 0 Specimen Collection (cultures, biopsies, blood, body fluids, etc.) []  - 0 Specimen(s) / Culture(s) sent or taken to Lab for analysis []  - 0 Patient Transfer (multiple staff / Civil Service fast streamer / Similar devices) []  - 0 Simple Staple / Suture removal (25 or less) []  - 0 Complex Staple / Suture removal (26 or more) []  - 0 Hypo / Hyperglycemic Management (close monitor of Blood Glucose) []  - 0 Ankle / Brachial Index (ABI) - do not check if billed separately X- 1 5 Vital Signs Has the patient been seen at the hospital within the last three years: Yes Total Score: 80 Level Of Care: New/Established - Level 3 Electronic Signature(s) Signed: 07/10/2020 5:10:27 PM By: Gretta Cool, BSN, RN, CWS, Kim RN, BSN Entered By: Gretta Cool, BSN, RN, CWS, Kim on 07/10/2020 16:08:58 [name] ([id number]) -------------------------------------------------------------------------------- Encounter Discharge Information Details Patient Name: Sliva, [name] Date of Service: 07/10/2020 3:30 PM Medical Record Number: [id number] Patient Account Number: [id number] Date of Birth/Sex: [birth year] (74 y.o. F) Treating RN: Cornell Barman Primary Care [name]: [name] Other Clinician: Referring Bright Spielmann: [name] Treating [name]/Extender: [name] in Treatment: 1 Encounter Discharge Information Items Post Procedure Vitals Discharge Condition: Stable Temperature (F): 98.5 Ambulatory Status: Ambulatory Pulse (bpm): 76 Discharge Destination: Home Respiratory Rate (breaths/min): 16 Transportation: Private Auto Blood Pressure (mmHg): 175/88 Accompanied By: self Schedule Follow-up Appointment: No Clinical Summary of  Care: Electronic Signature(s) Signed: 07/10/2020 5:10:27 PM By: Gretta Cool, BSN, RN, CWS, Kim RN, BSN Entered By: Gretta Cool, BSN, RN, CWS, Kim on 07/10/2020 16:10:36 Versteeg, Kiffany CMarland Kitchen ([id number]) -------------------------------------------------------------------------------- Lower Extremity Assessment Details Patient Name: Wiechman, Santos C. Date of Service: 07/10/2020 3:30 PM Medical Record Number: [id number] Patient Account Number: [id number] Date of Birth/Sex: 1945/09/18 (74 y.o. F) Treating RN: Cornell Barman Primary Care Melvern Ramone: [name] Other Clinician: Referring Camyla Camposano: [name] Treating Thurley Francesconi/Extender: [name] in Treatment: 1 Edema Assessment Assessed: [Left: No] [Right: No] [Left: Edema] [Right: :] Calf Left: Right: Point of Measurement: 32 cm From Medial Instep 38.2 cm Ankle Left: Right: Point of Measurement: 10 cm From Medial Instep 24.2 cm Vascular Assessment Pulses: Dorsalis Pedis Palpable: [Left:Yes] Posterior Tibial Palpable: [Left:Yes] Electronic Signature(s) Signed: 07/10/2020 5:10:27 PM By: Gretta Cool, BSN, RN, CWS, Kim RN, BSN Entered By: Gretta Cool, BSN, RN, CWS, Kim on 07/10/2020 15:46:08 Pile, Dayana CMarland Kitchen ([id number]) -------------------------------------------------------------------------------- Multi Wound Chart Details Patient Name: [name], Ronan C. Date of Service: 07/10/2020 3:30 PM Medical Record Number: [id number] Patient Account Number: [id number] Date of Birth/Sex: 06-04-1946 (74 y.o. F) Treating RN: Cornell Barman Primary Care Mariene Dickerman: [name] Other Clinician: Referring Zohair Epp: [name] Treating Madason Rauls/Extender: [name] in Treatment: 1 Vital Signs Height(in): 61 Pulse(bpm): 75 Weight(lbs): 175 Blood Pressure(mmHg): 175/88 Body Mass Index(BMI): 33 Temperature(F): 98.5 Respiratory Rate(breaths/min): 16 Photos: [N/A:N/A] Wound Location: Left, Anterior Lower Leg N/A N/A Wounding Event: Puncture  N/A N/A Primary Etiology: Skin Tear N/A N/A Comorbid History: Cataracts, Asthma, Hypertension, N/A N/A Osteoarthritis, Received Radiation Date Acquired: 05/18/2020 N/A N/A Weeks of Treatment: 1 N/A N/A Wound Status: Open N/A N/A Measurements L x W x D (cm) 1.1x1.2x0.4 N/A N/A Area (cm) : 1.037 N/A N/A Volume (cm) : 0.415 N/A N/A % Reduction in Area: 32.30% N/A N/A % Reduction in Volume: -171.20% N/A N/A Classification: Full Thickness Without Exposed N/A N/A Support Structures Exudate Amount: Medium N/A N/A Exudate Type: Serous N/A  N/A Exudate Color: amber N/A N/A Wound Margin: Thickened N/A N/A Granulation Amount: Medium (34-66%) N/A N/A Granulation Quality: Pink, Hyper-granulation N/A N/A Necrotic Amount: Medium (34-66%) N/A N/A Exposed Structures: Fat Layer (Subcutaneous Tissue): N/A N/A Yes Fascia: No Tendon: No Muscle: No Joint: No Bone: No Epithelialization: None N/A N/A Treatment Notes Electronic Signature(s) Signed: 07/10/2020 5:10:27 PM By: Gretta Cool, BSN, RN, CWS, Kim RN, BSN Entered By: Gretta Cool, BSN, RN, CWS, Kim on 07/10/2020 15:46:19 Eckstrom, [name] ([id number]) -------------------------------------------------------------------------------- Artois Details Patient Name: [name], Lachell C. Date of Service: 07/10/2020 3:30 PM Medical Record Number: [id number] Patient Account Number: [id number] Date of Birth/Sex: September 03, 1945 (74 y.o. F) Treating RN: Cornell Barman Primary Care Awilda Covin: [name] Other Clinician: Referring Gyasi Hazzard: [name] Treating Sabre Leonetti/Extender: [name] in Treatment: 1 Active Inactive Orientation to the Wound Care Program Nursing Diagnoses: Knowledge deficit related to the wound healing center program Goals: Patient/caregiver will verbalize understanding of the [facility] Program Date Initiated: 07/03/2020 Target Resolution Date: 07/31/2020 Goal Status: Active Interventions: Provide  education on orientation to the wound center Notes: Wound/Skin Impairment Nursing Diagnoses: Impaired tissue integrity Goals: Ulcer/skin breakdown will have a volume reduction of 30% by week 4 Date Initiated: 07/03/2020 Target Resolution Date: 07/31/2020 Goal Status: Active Interventions: Assess ulceration(s) every visit Notes: Electronic Signature(s) Signed: 07/10/2020 5:10:27 PM By: Gretta Cool, BSN, RN, CWS, Kim RN, BSN Entered By: Gretta Cool, BSN, RN, CWS, Kim on 07/10/2020 15:46:13 Gherardi, Niambi C. ([id number]) -------------------------------------------------------------------------------- Pain Assessment Details Patient Name: [name], Doniqua C. Date of Service: 07/10/2020 3:30 PM Medical Record Number: [id number] Patient Account Number: [id number] Date of Birth/Sex: 02-27-1946 (74 y.o. F) Treating RN: Cornell Barman Primary Care Gerrie Castiglia: [name] Other Clinician: Referring Cambria Osten: [name] Treating Alyha Marines/Extender: [name] in Treatment: 1 Active Problems Location of Pain Severity and Description of Pain Patient Has Paino No Site Locations Pain Management and Medication Current Pain Management: Electronic Signature(s) Signed: 07/10/2020 5:10:27 PM By: Gretta Cool, BSN, RN, CWS, Kim RN, BSN Entered By: Gretta Cool, BSN, RN, CWS, Kim on 07/10/2020 15:39:45 [name] ([id number]) -------------------------------------------------------------------------------- Patient/Caregiver Education Details Patient Name: [name] Date of Service: 07/10/2020 3:30 PM Medical Record Number: [id number] Patient Account Number: [id number] Date of Birth/Gender: 13-Sep-1945 (74 y.o. F) Treating RN: Cornell Barman Primary Care Physician: [name] Other Clinician: Referring Physician: [name] Treating Physician/Extender: [name] in Treatment: 1 Education Assessment Education Provided To: Patient Education Topics Provided Wound/Skin  Impairment: Handouts: Caring for Your Ulcer Methods: Demonstration, Explain/Verbal Responses: State content correctly Electronic Signature(s) Signed: 07/10/2020 5:10:27 PM By: Gretta Cool, BSN, RN, CWS, Kim RN, BSN Entered By: Gretta Cool, BSN, RN, CWS, Kim on 07/10/2020 16:09:25 [name] ([id number]) -------------------------------------------------------------------------------- Wound Assessment Details Patient Name: Rudell, Deisi C. Date of Service: 07/10/2020 3:30 PM Medical Record Number: [id number] Patient Account Number: [id number] Date of Birth/Sex: Apr 08, 1946 (74 y.o. F) Treating RN: Cornell Barman Primary Care Lexey Fletes: [name] Other Clinician: Referring Jazzlin Clements: [name] Treating Zerick Prevette/Extender: [name] in Treatment: 1 Wound Status Wound Number: 1 Primary Skin Tear Etiology: Wound Location: Left, Anterior Lower Leg Wound Status: Open Wounding Event: Puncture Comorbid Cataracts, Asthma, Hypertension, Osteoarthritis, Date Acquired: 05/18/2020 History: Received Radiation Weeks Of Treatment: 1 Clustered Wound: No Photos Wound Measurements Length: (cm) 1.1 Width: (cm) 1.2 Depth: (cm) 0.4 Area: (cm) 1.037 Volume: (cm) 0.415 % Reduction in Area: 32.3% % Reduction in Volume: -171.2% Epithelialization: None Tunneling: No Undermining: Yes Starting Position (o'clock): 1 Ending Position (o'clock): 1 Maximum Distance: (cm) 0.2 Wound Description  Classification: Full Thickness Without Exposed Support Structu Wound Margin: Thickened Exudate Amount: Medium Exudate Type: Serous Exudate Color: amber res Foul Odor After Cleansing: No Slough/Fibrino Yes Wound Bed Granulation Amount: Medium (34-66%) Exposed Structure Granulation Quality: Pink, Hyper-granulation Fascia Exposed: No Necrotic Amount: Medium (34-66%) Fat Layer (Subcutaneous Tissue) Exposed: Yes Necrotic Quality: Adherent Slough Tendon Exposed: No Muscle Exposed: No Joint Exposed:  No Bone Exposed: No Treatment Notes Wound #1 (Left, Anterior Lower Leg) Amato, Carmencita C. ([id number]) Notes SCELL, telfa, compression socks Electronic Signature(s) Signed: 07/10/2020 5:10:27 PM By: Gretta Cool, BSN, RN, CWS, Kim RN, BSN Entered By: Gretta Cool, BSN, RN, CWS, Kim on 07/10/2020 15:57:50 Budhu, Jinger CMarland Kitchen ([id number]) -------------------------------------------------------------------------------- Vitals Details Patient Name: [name], Daelynn C. Date of Service: 07/10/2020 3:30 PM Medical Record Number: [id number] Patient Account Number: [id number] Date of Birth/Sex: Jan 19, 1946 (74 y.o. F) Treating RN: Cornell Barman Primary Care Bran Aldridge: [name] Other Clinician: Referring Colie Josten: [name] Treating Makel Mcmann/Extender: [name] in Treatment: 1 Vital Signs Time Taken: 15:37 Temperature (F): 98.5 Height (in): 61 Pulse (bpm): 75 Weight (lbs): 175 Respiratory Rate (breaths/min): 16 Body Mass Index (BMI): 33.1 Blood Pressure (mmHg): 175/88 Reference Range: 80 - 120 mg / dl Electronic Signature(s) Signed: 07/10/2020 5:10:27 PM By: Gretta Cool, BSN, RN, CWS, Kim RN, BSN Entered By: Gretta Cool, BSN, RN, CWS, Kim on 07/10/2020 15:38:09

## 2020-07-10 NOTE — Progress Notes (Addendum)
ZINNIA, TINDALL ([id number]) Visit Report for 07/10/2020 Chief Complaint Document Details Patient Name: MALARIE, TAPPEN C. Date of Service: 07/10/2020 3:30 PM Medical Record Number: [id number] Patient Account Number: [id number] Date of Birth/Sex: May 15, 1946 (74 y.o. F) Treating RN: Cornell Barman Primary Care Provider: [name] Other Clinician: Referring Provider: [name] Treating Provider/Extender: [name] in Treatment: 1 Information Obtained from: Patient Chief Complaint Left LE Ulcer Electronic Signature(s) Signed: 07/10/2020 3:36:46 PM By: [name] Entered By: Worthy Keeler on 07/10/2020 15:36:45 Foree, Caralynn CMarland Kitchen ([id number]) -------------------------------------------------------------------------------- Debridement Details Patient Name: [name] Date of Service: 07/10/2020 3:30 PM Medical Record Number: [id number] Patient Account Number: [id number] Date of Birth/Sex: 1946-05-12 (74 y.o. F) Treating RN: Cornell Barman Primary Care Provider: [name] Other Clinician: Referring Provider: [name] Treating Provider/Extender: [name] in Treatment: 1 Debridement Performed for Wound #1 Left,Anterior Lower Leg Assessment: Performed By: Physician Tommie Sams., PA-C Debridement Type: Chemical/Enzymatic/Mechanical Agent Used: saline and gauze Level of Consciousness (Pre- Awake and Alert procedure): Pre-procedure Verification/Time Out Yes - 15:58 Taken: Pain Control: Lidocaine Instrument: Other : saline and gauze Bleeding: Minimum Hemostasis Achieved: Pressure Response to Treatment: Procedure was tolerated well Level of Consciousness (Post- Awake and Alert procedure): Post Debridement Measurements of Total Wound Length: (cm) 1.1 Width: (cm) 1.2 Depth: (cm) 0.4 Volume: (cm) 0.415 Character of Wound/Ulcer Post Debridement: Stable Post Procedure Diagnosis Same as Pre-procedure Electronic  Signature(s) Signed: 07/10/2020 4:28:23 PM By: [name] Signed: 07/10/2020 5:10:27 PM By: Gretta Cool, BSN, RN, CWS, Kim RN, BSN Entered By: Gretta Cool, BSN, RN, CWS, Kim on 07/10/2020 15:58:53 Coopman, Chasiti C. ([id number]) -------------------------------------------------------------------------------- HPI Details Patient Name: [name], Neala C. Date of Service: 07/10/2020 3:30 PM Medical Record Number: [id number] Patient Account Number: [id number] Date of Birth/Sex: 09-01-1945 (74 y.o. F) Treating RN: Cornell Barman Primary Care Provider: [name] Other Clinician: Referring Provider: [name] Treating Provider/Extender: [name] in Treatment: 1 History of Present Illness HPI Description: 07/03/2020 upon evaluation today patient appears to be doing unfortunately somewhat poorly in regard to her leg where she struck this causing a wound 05/18/2020. She states that since that time she has had mainly an eschar covering sometimes it will drain sometimes not its been infected a couple times as well and she has been on several antibiotics including 5 days left of doxycycline that she is currently taking. No sharp debridement has been undertaken by anyone at this point. She tells me that she does have a history of leg swelling she is supposed to wear compression socks but she does not always do this. She also tells me that she does have hypertension but otherwise no major medical problems. She has not really been doing anything compression wise since this was injured in October. 07/10/2020 on evaluation today patient actually appears to be making some progress here. Fortunately there is no signs of active infection systemically locally she still has some evidence of erythema infection although this is minimal compared to what it was even last week. Overall I am pleased in that regard. There does not appear to be any signs of active infection spreading outside aware of marked  that is good news as well. In general the patient seems to be doing well although I think we already continue to probably treat her we likely need to switch her to Bactrim DS as the doxycycline is resistant that is what she was taking as of last week. Electronic Signature(s) Signed: 07/10/2020 4:04:55 PM By: Joaquim Lai  III, Briyan Kleven PA-C Entered By: Worthy Keeler on 07/10/2020 16:04:55 Augenstein, [name] ([id number]) -------------------------------------------------------------------------------- Physical Exam Details Patient Name: Liotta, Jaasia C. Date of Service: 07/10/2020 3:30 PM Medical Record Number: [id number] Patient Account Number: [id number] Date of Birth/Sex: [birth year] (74 y.o. F) Treating RN: Cornell Barman Primary Care Provider: [name] Other Clinician: Referring Provider: [name] Treating Provider/Extender: [name] in Treatment: 1 Constitutional Well-nourished and well-hydrated in no acute distress. Respiratory normal breathing without difficulty. Psychiatric this patient is able to make decisions and demonstrates good insight into disease process. Alert and Oriented x 3. pleasant and cooperative. Notes Patient's wound bed actually showed signs of doing fairly well in my opinion there does not appear to be any signs of active infection in general I am actually very pleased with where things stand. I am can however go ahead and send in a prescription for Bactrim DS based on her culture result she did shows signs of a Staph aureus infection although it is resistant to several medications the gentamicin topically is good and Bactrim will work. The doxycycline she has been on will not work. Electronic Signature(s) Signed: 07/10/2020 4:10:27 PM By: [name] Entered By: Worthy Keeler on 07/10/2020 16:10:27 L[place]bury, [name] ([id number]) -------------------------------------------------------------------------------- Physician Orders  Details Patient Name: [name] Date of Service: 07/10/2020 3:30 PM Medical Record Number: [id number] Patient Account Number: [id number] Date of Birth/Sex: [birth year] (74 y.o. F) Treating RN: Cornell Barman Primary Care Provider: [name] Other Clinician: Referring Provider: [name] Treating Provider/Extender: [name] in Treatment: 1 Verbal / Phone Orders: No Diagnosis Coding ICD-10 Coding Code Description I87.2 Venous insufficiency (chronic) (peripheral) S81.802A Unspecified open wound, left lower leg, initial encounter L97.822 Non-pressure chronic ulcer of other part of left lower leg with fat layer exposed I10 Essential (primary) hypertension Wound Cleansing Wound #1 Left,Anterior Lower Leg o Clean wound with Normal Saline. Anesthetic (add to Medication List) Wound #1 Left,Anterior Lower Leg o Topical Lidocaine 4% cream applied to wound bed prior to debridement (In Clinic Only). Skin Barriers/Peri-Wound Care Wound #1 Left,Anterior Lower Leg o Moisturizing lotion Primary Wound Dressing Wound #1 Left,Anterior Lower Leg o Gentamicin Sulfate Cream o Silver Alginate Secondary Dressing Wound #1 Left,Anterior Lower Leg o [place] Follow-up Appointments Wound #1 Left,Anterior Lower Leg o Return Appointment in: - Friday o Nurse Visit as needed Edema Control Wound #1 Left,Anterior Lower Leg o Patient to wear own compression stockings Medications-please add to medication list. Wound #1 Left,Anterior Lower Leg o P.O. Antibiotics Patient Medications Allergies: penicillin, latex Notifications Medication Indication Start End Bactrim DS 07/10/2020 DOSE 1 - oral 800 mg-160 mg tablet - 1 tablet oral taken 2 times per day for 10 days Monds, Dexter C. ([id number]) Electronic Signature(s) Signed: 07/10/2020 4:11:59 PM By: [name] Entered By: Worthy Keeler on 07/10/2020 16:11:59 Borras, Deneene C.  ([id number]) -------------------------------------------------------------------------------- Problem List Details Patient Name: [name], Madelyne C. Date of Service: 07/10/2020 3:30 PM Medical Record Number: [id number] Patient Account Number: [id number] Date of Birth/Sex: 11-04-1945 (74 y.o. F) Treating RN: Cornell Barman Primary Care Provider: [name] Other Clinician: Referring Provider: [name] Treating Provider/Extender: [name] in Treatment: 1 Active Problems ICD-10 Encounter Code Description Active Date MDM Diagnosis I87.2 Venous insufficiency (chronic) (peripheral) 07/03/2020 No Yes S81.802A Unspecified open wound, left lower leg, initial encounter 07/03/2020 No Yes L97.822 Non-pressure chronic ulcer of other part of left lower leg with fat layer 07/03/2020 No Yes exposed [place] (primary) hypertension 07/03/2020  No Yes Inactive Problems Resolved Problems Electronic Signature(s) Signed: 07/10/2020 3:36:39 PM By: [name] Entered By: Worthy Keeler on 07/10/2020 15:36:39 Brodhead, Espyn C. ([id number]) -------------------------------------------------------------------------------- Progress Note Details Patient Name: Clausen, Aurie C. Date of Service: 07/10/2020 3:30 PM Medical Record Number: [id number] Patient Account Number: [id number] Date of Birth/Sex: September 19, 1945 (74 y.o. F) Treating RN: Cornell Barman Primary Care Provider: [name] Other Clinician: Referring Provider: [name] Treating Provider/Extender: [name] in Treatment: 1 Subjective Chief Complaint Information obtained from Patient Left LE Ulcer History of Present Illness (HPI) 07/03/2020 upon evaluation today patient appears to be doing unfortunately somewhat poorly in regard to her leg where she struck this causing a wound 05/18/2020. She states that since that time she has had mainly an eschar covering sometimes it will drain sometimes not its  been infected a couple times as well and she has been on several antibiotics including 5 days left of doxycycline that she is currently taking. No sharp debridement has been undertaken by anyone at this point. She tells me that she does have a history of leg swelling she is supposed to wear compression socks but she does not always do this. She also tells me that she does have hypertension but otherwise no major medical problems. She has not really been doing anything compression wise since this was injured in October. 07/10/2020 on evaluation today patient actually appears to be making some progress here. Fortunately there is no signs of active infection systemically locally she still has some evidence of erythema infection although this is minimal compared to what it was even last week. Overall I am pleased in that regard. There does not appear to be any signs of active infection spreading outside aware of marked that is good news as well. In general the patient seems to be doing well although I think we already continue to probably treat her we likely need to switch her to Bactrim DS as the doxycycline is resistant that is what she was taking as of last week. Objective Constitutional Well-nourished and well-hydrated in no acute distress. Vitals Time Taken: 3:37 PM, Height: 61 in, Weight: 175 lbs, BMI: 33.1, Temperature: 98.5 F, Pulse: 75 bpm, Respiratory Rate: 16 breaths/min, Blood Pressure: 175/88 mmHg. Respiratory normal breathing without difficulty. Psychiatric this patient is able to make decisions and demonstrates good insight into disease process. Alert and Oriented x 3. pleasant and cooperative. General Notes: Patient's wound bed actually showed signs of doing fairly well in my opinion there does not appear to be any signs of active infection in general I am actually very pleased with where things stand. I am can however go ahead and send in a prescription for Bactrim DS based on her  culture result she did shows signs of a Staph aureus infection although it is resistant to several medications the gentamicin topically is good and Bactrim will work. The doxycycline she has been on will not work. Integumentary (Hair, Skin) Wound #1 status is Open. Original cause of wound was Puncture. The wound is located on the Left,Anterior Lower Leg. The wound measures 1.1cm length x 1.2cm width x 0.4cm depth; 1.037cm^2 area and 0.415cm^3 volume. There is Fat Layer (Subcutaneous Tissue) exposed. There is no tunneling noted, however, there is undermining starting at 1:00 and ending at 1:00 with a maximum distance of 0.2cm. There is a medium amount of serous drainage noted. The wound margin is thickened. There is medium (34-66%) pink, hyper - granulation within the wound bed. There  is a medium (34-66%) amount of necrotic tissue within the wound bed including Adherent Slough. Assessment Active Problems ICD-10 Hacking, Jamila C. ([id number]) Venous insufficiency (chronic) (peripheral) Unspecified open wound, left lower leg, initial encounter Non-pressure chronic ulcer of other part of left lower leg with fat layer exposed Essential (primary) hypertension Procedures Wound #1 Pre-procedure diagnosis of Wound #1 is a Skin Tear located on the Left,Anterior Lower Leg . There was a Chemical/Enzymatic/Mechanical debridement performed by Tommie Sams., PA-C. With the following instrument(s): saline and gauze after achieving pain control using Lidocaine. Other agent used was saline and gauze. A time out was conducted at 15:58, prior to the start of the procedure. A Minimum amount of bleeding was controlled with Pressure. The procedure was tolerated well. Post Debridement Measurements: 1.1cm length x 1.2cm width x 0.4cm depth; 0.415cm^3 volume. Character of Wound/Ulcer Post Debridement is stable. Post procedure Diagnosis Wound #1: Same as Pre-Procedure Plan Wound Cleansing: Wound #1 Left,Anterior  Lower Leg: Clean wound with Normal Saline. Anesthetic (add to Medication List): Wound #1 Left,Anterior Lower Leg: Topical Lidocaine 4% cream applied to wound bed prior to debridement (In Clinic Only). Skin Barriers/Peri-Wound Care: Wound #1 Left,Anterior Lower Leg: Moisturizing lotion Primary Wound Dressing: Wound #1 Left,Anterior Lower Leg: Gentamicin Sulfate Cream Silver Alginate Secondary Dressing: Wound #1 Left,Anterior Lower Leg: [place] Follow-up Appointments: Wound #1 Left,Anterior Lower Leg: Return Appointment in: - Friday Nurse Visit as needed Edema Control: Wound #1 Left,Anterior Lower Leg: Patient to wear own compression stockings Medications-please add to medication list.: Wound #1 Left,Anterior Lower Leg: P.O. Antibiotics The following medication(s) was prescribed: Bactrim DS oral 800 mg-160 mg tablet 1 1 tablet oral taken 2 times per day for 10 days starting 07/10/2020 1. Would recommend currently that we go ahead and continue with wound care measures as before specifically with regard to the antibiotic I would recommend that we put her on Bactrim DS I think that skin is doing a good job for her. With regard to the dressing were going to continue with the silver alginate and topical gentamicin. 2. I would recommend as well that the patient continue to put a protective dressing over this she seems to be doing excellent and changing that nicely I think that she has been cleaning it well as well. Overall the erythema has improved despite the fact she did not have a good oral antibiotic I think she will be doing better with the Bactrim. 3. I am going to suggest specifically that she cover this with a [place] dressing that has done excellent for her I see no reason to make a change in that regard right now. We may wrap her in the future depending how things are progressing but right now I think this is the best option. I want to get the infection under  control. We will see patient back for reevaluation in 1 week here in the clinic. If anything worsens or changes patient will contact our office for additional recommendations. LAURRIE, TOPPIN ([id number]) Electronic Signature(s) Signed: 07/10/2020 4:12:32 PM By: [name] Entered By: Worthy Keeler on 07/10/2020 16:12:32 Delcid, Cheryle CMarland Kitchen ([id number]) -------------------------------------------------------------------------------- SuperBill Details Patient Name: [name] Date of Service: 07/10/2020 Medical Record Number: [id number] Patient Account Number: [id number] Date of Birth/Sex: [birth year] (74 y.o. F) Treating RN: Cornell Barman Primary Care Provider: [name] Other Clinician: Referring Provider: [name] Treating Provider/Extender: [name] in Treatment: 1 Diagnosis Coding ICD-10 Codes Code Description I87.2 Venous insufficiency (chronic) (peripheral) S96.283M  Unspecified open wound, left lower leg, initial encounter L97.822 Non-pressure chronic ulcer of other part of left lower leg with fat layer exposed [place] (primary) hypertension Facility Procedures CPT4 Code: 90903014 Description: 99692 - WOUND CARE VISIT-LEV 3 EST PT Modifier: Quantity: 1 Physician Procedures CPT4 Code: 4932419 Description: 91444 - WC PHYS LEVEL 4 - EST PT Modifier: Quantity: 1 CPT4 Code: Description: ICD-10 Diagnosis Description I87.2 Venous insufficiency (chronic) (peripheral) S81.802A Unspecified open wound, left lower leg, initial encounter L97.822 Non-pressure chronic ulcer of other part of left lower leg with fat lay [place]  (primary) hypertension Modifier: er exposed Quantity: Electronic Signature(s) Signed: 07/10/2020 4:12:48 PM By: [name] Entered By: Worthy Keeler on 07/10/2020 16:12:48

## 2020-07-10 NOTE — Assessment & Plan Note (Signed)
Patient blood pressure stable on the present medication.

## 2020-07-10 NOTE — Assessment & Plan Note (Signed)

## 2020-07-14 DIAGNOSIS — J301 Allergic rhinitis due to pollen: Secondary | ICD-10-CM | POA: Diagnosis not present

## 2020-07-14 DIAGNOSIS — J3089 Other allergic rhinitis: Secondary | ICD-10-CM | POA: Diagnosis not present

## 2020-07-17 ENCOUNTER — Other Ambulatory Visit: Payer: Self-pay

## 2020-07-17 ENCOUNTER — Encounter: Payer: Medicare Other | Admitting: Physician Assistant

## 2020-07-17 DIAGNOSIS — I872 Venous insufficiency (chronic) (peripheral): Secondary | ICD-10-CM | POA: Diagnosis not present

## 2020-07-17 DIAGNOSIS — I10 Essential (primary) hypertension: Secondary | ICD-10-CM | POA: Diagnosis not present

## 2020-07-17 DIAGNOSIS — L97822 Non-pressure chronic ulcer of other part of left lower leg with fat layer exposed: Secondary | ICD-10-CM | POA: Diagnosis not present

## 2020-07-17 DIAGNOSIS — S81802A Unspecified open wound, left lower leg, initial encounter: Secondary | ICD-10-CM | POA: Diagnosis not present

## 2020-07-21 NOTE — Progress Notes (Signed)
DECKLYN, HORNIK ([id number]) Visit Report for 07/17/2020 Chief Complaint Document Details Patient Name: [name], [name]. Date of Service: 07/17/2020 12:00 PM Medical Record Number: [id number] Patient Account Number: [id number] Date of Birth/Sex: 11/17/1945 (74 y.o. F) Treating RN: Cornell Barman Primary Care Provider: [name] Other Clinician: Referring Provider: [name] Treating Provider/Extender: [name] in Treatment: 2 Information Obtained from: Patient Chief Complaint Left LE Ulcer Electronic Signature(s) Signed: 07/17/2020 12:21:39 PM By: [name] Entered By: Worthy Keeler on 07/17/2020 12:21:39 Flegal, Kimaria CMarland Kitchen ([id number]) -------------------------------------------------------------------------------- Debridement Details Patient Name: Cindy Ohm C. Date of Service: 07/17/2020 12:00 PM Medical Record Number: [id number] Patient Account Number: [id number] Date of Birth/Sex: 08/21/1945 (74 y.o. F) Treating RN: Cornell Barman Primary Care Provider: [name] Other Clinician: Referring Provider: [name] Treating Provider/Extender: [name] in Treatment: 2 Debridement Performed for Wound #1 Left,Anterior Lower Leg Assessment: Performed By: Physician Tommie Sams., PA-C Debridement Type: Debridement Level of Consciousness (Pre- Awake and Alert procedure): Pre-procedure Verification/Time Out Yes - 12:25 Taken: Start Time: 12:25 Pain Control: Lidocaine 4% Topical Solution Total Area Debrided (L x W): 1.1 (cm) x 1 (cm) = 1.1 (cm) Tissue and other material Viable, Non-Viable, Slough, Subcutaneous, Slough debrided: Level: Skin/Subcutaneous Tissue Debridement Description: Excisional Instrument: Curette Bleeding: Minimum Hemostasis Achieved: Pressure End Time: 12:28 Procedural Pain: 0 Post Procedural Pain: 0 Response to Treatment: Procedure was tolerated well Level of Consciousness (Post- Awake and  Alert procedure): Post Debridement Measurements of Total Wound Length: (cm) 1.1 Width: (cm) 1 Depth: (cm) 0.5 Volume: (cm) 0.432 Character of Wound/Ulcer Post Debridement: Improved Post Procedure Diagnosis Same as Pre-procedure Electronic Signature(s) Signed: 07/17/2020 1:58:59 PM By: [name] Signed: 07/21/2020 3:14:13 PM By: Gretta Cool, BSN, RN, CWS, Kim RN, BSN Entered By: Gretta Cool, BSN, RN, CWS, Kim on 07/17/2020 12:27:27 Havlin, Fallon C. ([id number]) -------------------------------------------------------------------------------- HPI Details Patient Name: Cindy Loop, [name] Date of Service: 07/17/2020 12:00 PM Medical Record Number: [id number] Patient Account Number: [id number] Date of Birth/Sex: 1946/03/29 (74 y.o. F) Treating RN: Cornell Barman Primary Care Provider: [name] Other Clinician: Referring Provider: [name] Treating Provider/Extender: [name] in Treatment: 2 History of Present Illness HPI Description: 07/03/2020 upon evaluation today patient appears to be doing unfortunately somewhat poorly in regard to her leg where she struck this causing a wound 05/18/2020. She states that since that time she has had mainly an eschar covering sometimes it will drain sometimes not its been infected a couple times as well and she has been on several antibiotics including 5 days left of doxycycline that she is currently taking. No sharp debridement has been undertaken by anyone at this point. She tells me that she does have a history of leg swelling she is supposed to wear compression socks but she does not always do this. She also tells me that she does have hypertension but otherwise no major medical problems. She has not really been doing anything compression wise since this was injured in October. 07/10/2020 on evaluation today patient actually appears to be making some progress here. Fortunately there is no signs of active infection systemically  locally she still has some evidence of erythema infection although this is minimal compared to what it was even last week. Overall I am pleased in that regard. There does not appear to be any signs of active infection spreading outside aware of marked that is good news as well. In general the patient seems to be doing well although I think we already  continue to probably treat her we likely need to switch her to Bactrim DS as the doxycycline is resistant that is what she was taking as of last week. 07/17/2020 upon evaluation today patient appears to be doing excellent in regard to her leg ulcer. In fact I think we can probably switch to collagen-based dressing at this point as she seems to be doing so well. There is no evidence of infection at this time and I think that she is making good progress. I do want her to continue with the oral antibiotics at this point. Electronic Signature(s) Signed: 07/17/2020 12:46:41 PM By: [name] Entered By: Worthy Keeler on 07/17/2020 12:46:41 Renstrom, Starlena Loletha Grayer ([id number]) -------------------------------------------------------------------------------- Physical Exam Details Patient Name: [name], [name] Date of Service: 07/17/2020 12:00 PM Medical Record Number: [id number] Patient Account Number: [id number] Date of Birth/Sex: December 26, 1945 (74 y.o. F) Treating RN: Cornell Barman Primary Care Provider: [name] Other Clinician: Referring Provider: [name] Treating Provider/Extender: [name] in Treatment: 2 Constitutional Well-nourished and well-hydrated in no acute distress. Respiratory normal breathing without difficulty. Psychiatric this patient is able to make decisions and demonstrates good insight into disease process. Alert and Oriented x 3. pleasant and cooperative. Notes His wound bed currently showed signs of good granulation there was no evidence of infection and overall I think that though she is going  require some sharp debridement today this is not good to be something ongoing that we have to continue to do once we get it to a certain point feeling she should do quite well. I did explain that I do think clear away some of the necrotic tissue would help her currently she is in agreement with that therefore sharp debridement was performed post debridement wound bed appears to be doing much better. Electronic Signature(s) Signed: 07/17/2020 12:47:09 PM By: [name] Entered By: Worthy Keeler on 07/17/2020 12:47:09 [name], [name] ([id number]) -------------------------------------------------------------------------------- Physician Orders Details Patient Name: Cindy Ohm C. Date of Service: 07/17/2020 12:00 PM Medical Record Number: [id number] Patient Account Number: [id number] Date of Birth/Sex: July 23, 1946 (74 y.o. F) Treating RN: Cornell Barman Primary Care Provider: [name] Other Clinician: Referring Provider: [name] Treating Provider/Extender: [name] in Treatment: 2 Verbal / Phone Orders: No Diagnosis Coding ICD-10 Coding Code Description I87.2 Venous insufficiency (chronic) (peripheral) S81.802A Unspecified open wound, left lower leg, initial encounter L97.822 Non-pressure chronic ulcer of other part of left lower leg with fat layer exposed I10 Essential (primary) hypertension Wound Cleansing Wound #1 Left,Anterior Lower Leg o Clean wound with Normal Saline. Anesthetic (add to Medication List) Wound #1 Left,Anterior Lower Leg o Topical Lidocaine 4% cream applied to wound bed prior to debridement (In Clinic Only). Skin Barriers/Peri-Wound Care Wound #1 Left,Anterior Lower Leg o Moisturizing lotion Primary Wound Dressing Wound #1 Left,Anterior Lower Leg o Silver Collagen Secondary Dressing Wound #1 Left,Anterior Lower Leg o Dry Gauze - then covered with patients compression stockings Dressing Change Frequency Wound #1  Left,Anterior Lower Leg o Change dressing every other day. Follow-up Appointments Wound #1 Left,Anterior Lower Leg o Return Appointment in 2 weeks. o Nurse Visit as needed Edema Control Wound #1 Left,Anterior Lower Leg o Patient to wear own compression stockings Medications-please add to medication list. Wound #1 Left,Anterior Lower Leg o P.O. Antibiotics Electronic Signature(s) Signed: 07/17/2020 1:58:59 PM By: Alexis Frock, [place] ([id number]) Signed: 07/21/2020 3:14:13 PM By: Gretta Cool, BSN, RN, CWS, Kim RN, BSN Entered By: Gretta Cool, BSN, RN, CWS, Kim  on 07/17/2020 12:30:51 Clapham, Esmirna C. ([id number]) -------------------------------------------------------------------------------- Problem List Details Patient Name: Meador, Iria C. Date of Service: 07/17/2020 12:00 PM Medical Record Number: [id number] Patient Account Number: [id number] Date of Birth/Sex: [birth year] (74 y.o. F) Treating RN: Cornell Barman Primary Care Provider: [name] Other Clinician: Referring Provider: [name] Treating Provider/Extender: [name] in Treatment: 2 Active Problems ICD-10 Encounter Code Description Active Date MDM Diagnosis I87.2 Venous insufficiency (chronic) (peripheral) 07/03/2020 No Yes S81.802A Unspecified open wound, left lower leg, initial encounter 07/03/2020 No Yes L97.822 Non-pressure chronic ulcer of other part of left lower leg with fat layer 07/03/2020 No Yes exposed [place] (primary) hypertension 07/03/2020 No Yes Inactive Problems Resolved Problems Electronic Signature(s) Signed: 07/17/2020 12:21:31 PM By: [name] Entered By: Worthy Keeler on 07/17/2020 12:21:31 Claudio, Jeffrie C. ([id number]) -------------------------------------------------------------------------------- Progress Note Details Patient Name: Cindy Loop, Davonne C. Date of Service: 07/17/2020 12:00 PM Medical Record Number:  [id number] Patient Account Number: [id number] Date of Birth/Sex: 10/30/1945 (74 y.o. F) Treating RN: Cornell Barman Primary Care Provider: [name] Other Clinician: Referring Provider: [name] Treating Provider/Extender: [name] in Treatment: 2 Subjective Chief Complaint Information obtained from Patient Left LE Ulcer History of Present Illness (HPI) 07/03/2020 upon evaluation today patient appears to be doing unfortunately somewhat poorly in regard to her leg where she struck this causing a wound 05/18/2020. She states that since that time she has had mainly an eschar covering sometimes it will drain sometimes not its been infected a couple times as well and she has been on several antibiotics including 5 days left of doxycycline that she is currently taking. No sharp debridement has been undertaken by anyone at this point. She tells me that she does have a history of leg swelling she is supposed to wear compression socks but she does not always do this. She also tells me that she does have hypertension but otherwise no major medical problems. She has not really been doing anything compression wise since this was injured in October. 07/10/2020 on evaluation today patient actually appears to be making some progress here. Fortunately there is no signs of active infection systemically locally she still has some evidence of erythema infection although this is minimal compared to what it was even last week. Overall I am pleased in that regard. There does not appear to be any signs of active infection spreading outside aware of marked that is good news as well. In general the patient seems to be doing well although I think we already continue to probably treat her we likely need to switch her to Bactrim DS as the doxycycline is resistant that is what she was taking as of last week. 07/17/2020 upon evaluation today patient appears to be doing excellent in regard to her leg ulcer. In  fact I think we can probably switch to collagen-based dressing at this point as she seems to be doing so well. There is no evidence of infection at this time and I think that she is making good progress. I do want her to continue with the oral antibiotics at this point. Objective Constitutional Well-nourished and well-hydrated in no acute distress. Vitals Time Taken: 12:11 PM, Height: 61 in, Weight: 175 lbs, BMI: 33.1, Temperature: 98.3 F, Pulse: 80 bpm, Respiratory Rate: 16 breaths/min, Blood Pressure: 114/68 mmHg. Respiratory normal breathing without difficulty. Psychiatric this patient is able to make decisions and demonstrates good insight into disease process. Alert and Oriented x 3. pleasant and cooperative. General Notes: His  wound bed currently showed signs of good granulation there was no evidence of infection and overall I think that though she is going require some sharp debridement today this is not good to be something ongoing that we have to continue to do once we get it to a certain point feeling she should do quite well. I did explain that I do think clear away some of the necrotic tissue would help her currently she is in agreement with that therefore sharp debridement was performed post debridement wound bed appears to be doing much better. Integumentary (Hair, Skin) Wound #1 status is Open. Original cause of wound was Puncture. The wound is located on the Left,Anterior Lower Leg. The wound measures 1.1cm length x 1cm width x 0.4cm depth; 0.864cm^2 area and 0.346cm^3 volume. There is Fat Layer (Subcutaneous Tissue) exposed. There is no tunneling or undermining noted. There is a medium amount of serous drainage noted. The wound margin is thickened. There is medium (34- 66%) pink, hyper - granulation within the wound bed. There is a medium (34-66%) amount of necrotic tissue within the wound bed including Adherent Slough. JACOBI, RYANT ([id number]) Assessment Active  Problems ICD-10 Venous insufficiency (chronic) (peripheral) Unspecified open wound, left lower leg, initial encounter Non-pressure chronic ulcer of other part of left lower leg with fat layer exposed Essential (primary) hypertension Procedures Wound #1 Pre-procedure diagnosis of Wound #1 is a Skin Tear located on the Left,Anterior Lower Leg . There was a Excisional Skin/Subcutaneous Tissue Debridement with a total area of 1.1 sq cm performed by Tommie Sams., PA-C. With the following instrument(s): Curette to remove Viable and Non-Viable tissue/material. Material removed includes Subcutaneous Tissue and Slough and after achieving pain control using Lidocaine 4% Topical Solution. No specimens were taken. A time out was conducted at 12:25, prior to the start of the procedure. A Minimum amount of bleeding was controlled with Pressure. The procedure was tolerated well with a pain level of 0 throughout and a pain level of 0 following the procedure. Post Debridement Measurements: 1.1cm length x 1cm width x 0.5cm depth; 0.432cm^3 volume. Character of Wound/Ulcer Post Debridement is improved. Post procedure Diagnosis Wound #1: Same as Pre-Procedure Plan Wound Cleansing: Wound #1 Left,Anterior Lower Leg: Clean wound with Normal Saline. Anesthetic (add to Medication List): Wound #1 Left,Anterior Lower Leg: Topical Lidocaine 4% cream applied to wound bed prior to debridement (In Clinic Only). Skin Barriers/Peri-Wound Care: Wound #1 Left,Anterior Lower Leg: Moisturizing lotion Primary Wound Dressing: Wound #1 Left,Anterior Lower Leg: Silver Collagen Secondary Dressing: Wound #1 Left,Anterior Lower Leg: Dry Gauze - then covered with patients compression stockings Dressing Change Frequency: Wound #1 Left,Anterior Lower Leg: Change dressing every other day. Follow-up Appointments: Wound #1 Left,Anterior Lower Leg: Return Appointment in 2 weeks. Nurse Visit as needed Edema Control: Wound #1  Left,Anterior Lower Leg: Patient to wear own compression stockings Medications-please add to medication list.: Wound #1 Left,Anterior Lower Leg: P.O. Antibiotics 1. Would recommend that we go and switch to a different type of dressing I would recommend a silver collagen dressing for the patient and she is in agreement with that plan. Hopefully this will help to stimulate additional granulation tissue to fill in and epithelial growth externally. 2. Also can recommend at this time that we have the patient continue to elevate her legs much as possible and use her compression stockings she is doing excellent with that and I think that she should continue as such. I am not can use any adhesive on her right  she tells me this really damages her skin. We will see patient back for reevaluation in 2 weeks here in the clinic. If anything worsens or changes patient will contact our office for additional recommendations. MONIGUE, SPRAGGINS ([id number]) Electronic Signature(s) Signed: 07/17/2020 12:47:43 PM By: [name] Entered By: Worthy Keeler on 07/17/2020 12:47:43 Dawson, Loyalty CMarland Kitchen ([id number]) -------------------------------------------------------------------------------- SuperBill Details Patient Name: Cindy Ohm C. Date of Service: 07/17/2020 Medical Record Number: [id number] Patient Account Number: [id number] Date of Birth/Sex: 1946-05-08 (74 y.o. F) Treating RN: Cornell Barman Primary Care Provider: [name] Other Clinician: Referring Provider: [name] Treating Provider/Extender: [name] in Treatment: 2 Diagnosis Coding ICD-10 Codes Code Description I87.2 Venous insufficiency (chronic) (peripheral) S81.802A Unspecified open wound, left lower leg, initial encounter L97.822 Non-pressure chronic ulcer of other part of left lower leg with fat layer exposed [place] (primary) hypertension Facility Procedures CPT4 Code: [id number] Description:  25638 - DEB SUBQ TISSUE 20 SQ CM/< Modifier: Quantity: 1 CPT4 Code: Description: ICD-10 Diagnosis Description L97.822 Non-pressure chronic ulcer of other part of left lower leg with fat layer S81.802A Unspecified open wound, left lower leg, initial encounter Modifier: exposed Quantity: Physician Procedures CPT4 Code: [id number] Description: 76811 - WC PHYS SUBQ TISS 20 SQ CM Modifier: Quantity: 1 CPT4 Code: Description: ICD-10 Diagnosis Description L97.822 Non-pressure chronic ulcer of other part of left lower leg with fat layer S81.802A Unspecified open wound, left lower leg, initial encounter Modifier: exposed Quantity: Electronic Signature(s) Signed: 07/17/2020 12:47:57 PM By: [name] Entered By: Worthy Keeler on 07/17/2020 12:47:57

## 2020-07-21 NOTE — Progress Notes (Signed)
AIKAM, HELLICKSON ([id number]) Visit Report for 07/17/2020 Arrival Information Details Patient Name: [name], [name]. Date of Service: 07/17/2020 12:00 PM Medical Record Number: [id number] Patient Account Number: [id number] Date of Birth/Sex: Sep 07, 1945 (74 y.o. F) Treating RN: [name] Primary Care [name]: [name] Other Clinician: Referring [name]: [name] Treating [name]/Extender: [name] in Treatment: 2 Visit Information History Since Last Visit Has Dressing in Place as Prescribed: Yes Patient Arrived: Ambulatory Has Compression in Place as Prescribed: Yes Arrival Time: 12:10 Pain Present Now: No Accompanied By: self Transfer Assistance: None Patient Identification Verified: Yes Secondary Verification Process Completed: Yes Electronic Signature(s) Signed: 07/21/2020 3:14:13 PM By: [name], BSN, RN, CWS, Kim RN, BSN Entered By: [name], BSN, RN, CWS, Cindy on 07/17/2020 12:10:30 [name] ([id number]) -------------------------------------------------------------------------------- Encounter Discharge Information Details Patient Name: Cindy Ohm C. Date of Service: 07/17/2020 12:00 PM Medical Record Number: [id number] Patient Account Number: [id number] Date of Birth/Sex: 05/06/1946 (74 y.o. F) Treating RN: [name] Primary Care Mariel Gaudin: [name] Other Clinician: Referring Yuki Brunsman: [name] Treating Lc Joynt/Extender: [name] in Treatment: 2 Encounter Discharge Information Items Post Procedure Vitals Discharge Condition: Stable Unable to obtain vitals Reason: . Ambulatory Status: Ambulatory Discharge Destination: Home Transportation: Private Auto Accompanied By: self Schedule Follow-up Appointment: Yes Clinical Summary of Care: Electronic Signature(s) Signed: 07/17/2020 2:12:05 PM By: [name], BSN, RN, CWS, Kim RN, BSN Previous Signature: 07/17/2020 2:11:38 PM Version By: [name], BSN, RN, CWS, Kim RN,  BSN Entered By: [name], BSN, RN, CWS, Cindy on 07/17/2020 14:12:05 Pohlman, Neah CMarland Kitchen ([id number]) -------------------------------------------------------------------------------- Lower Extremity Assessment Details Patient Name: [name], [name] Date of Service: 07/17/2020 12:00 PM Medical Record Number: [id number] Patient Account Number: [id number] Date of Birth/Sex: [birth year] (74 y.o. F) Treating RN: [name] Primary Care Jesyca Weisenburger: [name] Other Clinician: Referring [name]: [name] Treating [name]/Extender: [name] in Treatment: 2 Vascular Assessment Pulses: Dorsalis Pedis Palpable: [Left:Yes] Electronic Signature(s) Signed: 07/21/2020 3:14:13 PM By: [name], BSN, RN, CWS, Kim RN, BSN Entered By: [name], BSN, RN, CWS, Cindy on 07/17/2020 12:18:02 [name] ([id number]) -------------------------------------------------------------------------------- Multi Wound Chart Details Patient Name: Cindy Loop, [name] Date of Service: 07/17/2020 12:00 PM Medical Record Number: [id number] Patient Account Number: [id number] Date of Birth/Sex: January 15, 1946 (74 y.o. F) Treating RN: [name] Primary Care Patrisia Faeth: [name] Other Clinician: Referring [name]: [name] Treating [name]/Extender: [name] in Treatment: 2 Vital Signs Height(in): 61 Pulse(bpm): 80 Weight(lbs): 175 Blood Pressure(mmHg): 114/68 Body Mass Index(BMI): 33 Temperature(F): 98.3 Respiratory Rate(breaths/min): 16 Photos: [1:No Photos] [N/A:N/A] Wound Location: [1:Left, Anterior Lower Leg] [N/A:N/A] Wounding Event: [1:Puncture] [N/A:N/A] Primary Etiology: [1:Skin Tear] [N/A:N/A] Comorbid History: [1:Cataracts, Asthma, Hypertension, Osteoarthritis, Received Radiation] [N/A:N/A] Date Acquired: [1:05/18/2020] [N/A:N/A] Weeks of Treatment: [1:2] [N/A:N/A] Wound Status: [1:Open] [N/A:N/A] Measurements L x W x D (cm) [1:1.1x1x0.4] [N/A:N/A] Area (cm) : [1:0.864]  [N/A:N/A] Volume (cm) : [1:0.346] [N/A:N/A] % Reduction in Area: [1:43.60%] [N/A:N/A] % Reduction in Volume: [1:-126.10%] [N/A:N/A] Classification: [1:Full Thickness Without Exposed Support Structures] [N/A:N/A] Exudate Amount: [1:Medium] [N/A:N/A] Exudate Type: [1:Serous] [N/A:N/A] Exudate Color: [1:amber] [N/A:N/A] Wound Margin: [1:Thickened] [N/A:N/A] Granulation Amount: [1:Medium (34-66%)] [N/A:N/A] Granulation Quality: [1:Pink, Hyper-granulation] [N/A:N/A] Necrotic Amount: [1:Medium (34-66%)] [N/A:N/A] Exposed Structures: [1:Fat Layer (Subcutaneous Tissue): Yes Fascia: No Tendon: No Muscle: No Joint: No Bone: No None] [N/A:N/A N/A] Treatment Notes Electronic Signature(s) Signed: 07/21/2020 3:14:13 PM By: [name], BSN, RN, CWS, Kim RN, BSN Entered By: [name], BSN, RN, CWS, Cindy on 07/17/2020 12:18:43 [name] ([id number]) -------------------------------------------------------------------------------- [place] Details Patient Name: Cindy Loop,  [name] Date of Service: 07/17/2020 12:00 PM Medical Record Number: [id number] Patient Account Number: [id number] Date of Birth/Sex: [birth year] (74 y.o. F) Treating RN: [name] Primary Care [name]: [name] Other Clinician: Referring [name]: [name] Treating [name]/Extender: [name] in Treatment: 2 Active Inactive Orientation to the Wound Care Program Nursing Diagnoses: Knowledge deficit related to the wound healing center program Goals: Patient/caregiver will verbalize understanding of the [place] [facility] Date Initiated: 07/03/2020 Target Resolution Date: 07/31/2020 Goal Status: Active Interventions: Provide education on orientation to the wound center Notes: Wound/Skin Impairment Nursing Diagnoses: Impaired tissue integrity Goals: Ulcer/skin breakdown will have a volume reduction of 30% by week 4 Date Initiated: 07/03/2020 Target Resolution Date:  07/31/2020 Goal Status: Active Interventions: Assess ulceration(s) every visit Notes: Electronic Signature(s) Signed: 07/21/2020 3:14:13 PM By: [name], BSN, RN, CWS, Kim RN, BSN Entered By: [name], BSN, RN, CWS, Cindy on 07/17/2020 12:18:36 Cuadros, Antoniette C. ([id number]) -------------------------------------------------------------------------------- Pain Assessment Details Patient Name: Cindy Loop, Libbey C. Date of Service: 07/17/2020 12:00 PM Medical Record Number: [id number] Patient Account Number: [id number] Date of Birth/Sex: Mar 01, 1946 (74 y.o. F) Treating RN: [name] Primary Care Domique Reardon: [name] Other Clinician: Referring Kysean Sweet: [name] Treating Aundra Pung/Extender: [name] in Treatment: 2 Active Problems Location of Pain Severity and Description of Pain Patient Has Paino No Site Locations Pain Management and Medication Current Pain Management: Electronic Signature(s) Signed: 07/21/2020 3:14:13 PM By: [name], BSN, RN, CWS, Kim RN, BSN Entered By: [name], BSN, RN, CWS, Cindy on 07/17/2020 12:12:44 [name] ([id number]) -------------------------------------------------------------------------------- Patient/Caregiver Education Details Patient Name: Cindy Ohm C. Date of Service: 07/17/2020 12:00 PM Medical Record Number: [id number] Patient Account Number: [id number] Date of Birth/Gender: 05-05-1946 (74 y.o. F) Treating RN: [name] Primary Care Physician: [name] Other Clinician: Referring Physician: Cletis [name] Treating Physician/Extender: [name] in Treatment: 2 Education Assessment Education Provided To: Patient Education Topics Provided Wound/Skin Impairment: Handouts: Caring for Your Ulcer, Other: continue wound care as prescribed Methods: Demonstration, Explain/Verbal Responses: State content correctly Electronic Signature(s) Signed: 07/21/2020 3:14:13 PM By: [name], BSN, RN, CWS, Kim RN, BSN Entered  By: [name], BSN, RN, CWS, Cindy on 07/17/2020 14:10:43 Stavros, [name] ([id number]) -------------------------------------------------------------------------------- Wound Assessment Details Patient Name: Salasar, Paislee C. Date of Service: 07/17/2020 12:00 PM Medical Record Number: [id number] Patient Account Number: [id number] Date of Birth/Sex: 04/11/1946 (74 y.o. F) Treating RN: [name] Primary Care Javien Tesch: [name] Other Clinician: Referring Deven Furia: [name] Treating Zhavia Cunanan/Extender: [name] in Treatment: 2 Wound Status Wound Number: 1 Primary Skin Tear Etiology: Wound Location: Left, Anterior Lower Leg Wound Status: Open Wounding Event: Puncture Comorbid Cataracts, Asthma, Hypertension, Osteoarthritis, Date Acquired: 05/18/2020 History: Received Radiation Weeks Of Treatment: 2 Clustered Wound: No Photos Photo Uploaded By: [name], BSN, RN, CWS, Cindy on 07/17/2020 14:31:56 Wound Measurements Length: (cm) 1.1 Width: (cm) 1 Depth: (cm) 0.4 Area: (cm) 0.864 Volume: (cm) 0.346 % Reduction in Area: 43.6% % Reduction in Volume: -126.1% Epithelialization: None Tunneling: No Undermining: No Wound Description Classification: Full Thickness Without Exposed Support Structu Wound Margin: Thickened Exudate Amount: Medium Exudate Type: Serous Exudate Color: amber res Foul Odor After Cleansing: No Slough/Fibrino Yes Wound Bed Granulation Amount: Medium (34-66%) Exposed Structure Granulation Quality: Pink, Hyper-granulation Fascia Exposed: No Necrotic Amount: Medium (34-66%) Fat Layer (Subcutaneous Tissue) Exposed: Yes Necrotic Quality: Adherent Slough Tendon Exposed: No Muscle Exposed: No Joint Exposed: No Bone Exposed: No Treatment Notes Wound #1 (Left, Anterior Lower Leg) Notes SCELL, telfa, compression socks Electronic Signature(s) Dicenzo, Crystalina  Loletha Grayer ([id number]) Signed: 07/21/2020 3:14:13 PM By: [name], BSN, RN, CWS, Kim RN,  BSN Entered By: [name], BSN, RN, CWS, Cindy on 07/17/2020 12:16:26 [name] ([id number]) -------------------------------------------------------------------------------- Vitals Details Patient Name: Cindy Ohm C. Date of Service: 07/17/2020 12:00 PM Medical Record Number: [id number] Patient Account Number: [id number] Date of Birth/Sex: [birth year] (74 y.o. F) Treating RN: [name] Primary Care Raynold Blankenbaker: [name] Other Clinician: Referring Hartman [name]: [name] Treating Marilynn Ekstein/Extender: [name] in Treatment: 2 Vital Signs Time Taken: 12:11 Temperature (F): 98.3 Height (in): 61 Pulse (bpm): 80 Weight (lbs): 175 Respiratory Rate (breaths/min): 16 Body Mass Index (BMI): 33.1 Blood Pressure (mmHg): 114/68 Reference Range: 80 - 120 mg / dl Electronic Signature(s) Signed: 07/21/2020 3:14:13 PM By: [name], BSN, RN, CWS, Kim RN, BSN Entered By: [name], BSN, RN, CWS, Cindy on 07/17/2020 12:12:29

## 2020-07-28 ENCOUNTER — Ambulatory Visit: Payer: Medicare Other | Admitting: Internal Medicine

## 2020-07-31 ENCOUNTER — Encounter: Payer: Medicare Other | Attending: Physician Assistant | Admitting: Physician Assistant

## 2020-07-31 ENCOUNTER — Other Ambulatory Visit: Payer: Self-pay

## 2020-07-31 DIAGNOSIS — I872 Venous insufficiency (chronic) (peripheral): Secondary | ICD-10-CM | POA: Insufficient documentation

## 2020-07-31 DIAGNOSIS — S81832A Puncture wound without foreign body, left lower leg, initial encounter: Secondary | ICD-10-CM | POA: Diagnosis not present

## 2020-07-31 DIAGNOSIS — L97822 Non-pressure chronic ulcer of other part of left lower leg with fat layer exposed: Secondary | ICD-10-CM | POA: Diagnosis not present

## 2020-07-31 DIAGNOSIS — L97829 Non-pressure chronic ulcer of other part of left lower leg with unspecified severity: Secondary | ICD-10-CM | POA: Diagnosis present

## 2020-07-31 DIAGNOSIS — W228XXA Striking against or struck by other objects, initial encounter: Secondary | ICD-10-CM | POA: Diagnosis not present

## 2020-07-31 NOTE — Progress Notes (Signed)
CANDIES, PALM ([id number]) Visit Report for 07/31/2020 Arrival Information Details Patient Name: [name], [name]. Date of Service: 07/31/2020 1:45 PM Medical Record Number: [id number] Patient Account Number: [id number] Date of Birth/Sex: 07-04-1946 (75 y.o. F) Treating [name]: [name] Primary Care Jiraiya Mcewan: [name] Other Clinician: Referring [name]: [name] Treating [name]/Extender: [name] in Treatment: 4 Visit Information History Since Last Visit Has Dressing in Place as Prescribed: Yes Patient Arrived: Ambulatory Pain Present Now: No Arrival Time: 13:46 Accompanied By: self Transfer Assistance: None Patient Identification Verified: Yes Secondary Verification Process Completed: Yes Electronic Signature(s) Signed: 07/31/2020 5:26:41 PM By: [name] Entered By: [name], BSN, [name], CWS, Cindy on 07/31/2020 13:46:35 [name], [name] ([id number]) -------------------------------------------------------------------------------- Clinic Level of Care Assessment Details Patient Name: [name], [name] Date of Service: 07/31/2020 1:45 PM Medical Record Number: [id number] Patient Account Number: [id number] Date of Birth/Sex: January 30, 1946 (75 y.o. F) Treating [name]: [name] Primary Care Anatalia Kronk: [name] Other Clinician: Referring [name]: [name] Treating Deiontae Rabel/Extender: [name] in Treatment: 4 Clinic Level of Care Assessment Items TOOL 4 Quantity Score []  - Use when only an EandM is performed on FOLLOW-UP visit 0 ASSESSMENTS - Nursing Assessment / Reassessment X - Reassessment of Co-morbidities (includes updates in patient status) 1 10 X- 1 5 Reassessment of Adherence to Treatment Plan ASSESSMENTS - Wound and Skin Assessment / Reassessment X - Simple Wound Assessment / Reassessment - one wound 1 5 []  - 0 Complex Wound Assessment / Reassessment - multiple wounds []  - 0 Dermatologic / Skin Assessment (not  related to wound area) ASSESSMENTS - Focused Assessment []  - Circumferential Edema Measurements - multi extremities 0 []  - 0 Nutritional Assessment / Counseling / Intervention []  - 0 Lower Extremity Assessment (monofilament, tuning fork, pulses) []  - 0 Peripheral Arterial Disease Assessment (using hand held doppler) ASSESSMENTS - Ostomy and/or Continence Assessment and Care []  - Incontinence Assessment and Management 0 []  - 0 Ostomy Care Assessment and Management (repouching, etc.) PROCESS - Coordination of Care X - Simple Patient / Family Education for ongoing care 1 15 []  - 0 Complex (extensive) Patient / Family Education for ongoing care X- 1 10 Staff obtains Programmer, systems, Records, Test Results / Process Orders []  - 0 Staff telephones HHA, Nursing Homes / Clarify orders / etc []  - 0 Routine Transfer to another Facility (non-emergent condition) []  - 0 Routine Hospital Admission (non-emergent condition) []  - 0 New Admissions / Biomedical engineer / Ordering NPWT, Apligraf, etc. []  - 0 Emergency Hospital Admission (emergent condition) X- 1 10 Simple Discharge Coordination []  - 0 Complex (extensive) Discharge Coordination PROCESS - Special Needs []  - Pediatric / Minor Patient Management 0 []  - 0 Isolation Patient Management []  - 0 Hearing / Language / Visual special needs []  - 0 Assessment of Community assistance (transportation, D/C planning, etc.) []  - 0 Additional assistance / Altered mentation []  - 0 Support Surface(s) Assessment (bed, cushion, seat, etc.) INTERVENTIONS - Wound Cleansing / Measurement [name], [name] ([id number]) X- 1 5 Simple Wound Cleansing - one wound []  - 0 Complex Wound Cleansing - multiple wounds X- 1 5 Wound Imaging (photographs - any number of wounds) []  - 0 Wound Tracing (instead of photographs) X- 1 5 Simple Wound Measurement - one wound []  - 0 Complex Wound Measurement - multiple wounds INTERVENTIONS - Wound Dressings []   - Small Wound Dressing one or multiple wounds 0 X- 1 15 Medium Wound Dressing one or multiple wounds []  - 0  Large Wound Dressing one or multiple wounds []  - 0 Application of Medications - topical []  - 0 Application of Medications - injection INTERVENTIONS - Miscellaneous []  - External ear exam 0 []  - 0 Specimen Collection (cultures, biopsies, blood, body fluids, etc.) []  - 0 Specimen(s) / Culture(s) sent or taken to Lab for analysis []  - 0 Patient Transfer (multiple staff / Civil Service fast streamer / Similar devices) []  - 0 Simple Staple / Suture removal (25 or less) []  - 0 Complex Staple / Suture removal (26 or more) []  - 0 Hypo / Hyperglycemic Management (close monitor of Blood Glucose) []  - 0 Ankle / Brachial Index (ABI) - do not check if billed separately X- 1 5 Vital Signs Has the patient been seen at the hospital within the last three years: Yes Total Score: 90 Level Of Care: New/Established - Level 3 Electronic Signature(s) Signed: 07/31/2020 5:26:41 PM By: [name] Entered By: [name], BSN, [name], CWS, Cindy on 07/31/2020 14:07:44 [name], [name] ([id number]) -------------------------------------------------------------------------------- Encounter Discharge Information Details Patient Name: [name], [name] Date of Service: 07/31/2020 1:45 PM Medical Record Number: [id number] Patient Account Number: [id number] Date of Birth/Sex: 20-May-1946 (75 y.o. F) Treating [name]: [name] Primary Care Chika Cichowski: [name] Other Clinician: Referring Nevyn Bossman: [name] Treating Emmanual [name]/Extender: [name] in Treatment: 4 Encounter Discharge Information Items Discharge Condition: Stable Ambulatory Status: Ambulatory Discharge Destination: Home Transportation: Private Auto Accompanied By: self Schedule Follow-up Appointment: Yes Clinical Summary of Care: Electronic Signature(s) Signed: 07/31/2020 5:26:41 PM By: [name], BSN, [name], CWS, [name],  [name] Entered By: [name], BSN, [name], CWS, Cindy on 07/31/2020 14:08:49 [name] ([id number]) -------------------------------------------------------------------------------- Lower Extremity Assessment Details Patient Name: [name], [name] Date of Service: 07/31/2020 1:45 PM Medical Record Number: [id number] Patient Account Number: [id number] Date of Birth/Sex: 30-Jul-1945 (75 y.o. F) Treating [name]: [name] Primary Care Latrise Bowland: [name] Other Clinician: Referring Jaloni Sorber: [name] Treating Martiza Speth/Extender: [name] in Treatment: 4 Vascular Assessment Pulses: Dorsalis Pedis Palpable: [Left:Yes] Electronic Signature(s) Signed: 07/31/2020 5:26:41 PM By: [name] Entered By: [name], BSN, [name], CWS, Cindy on 07/31/2020 13:55:04 [name] ([id number]) -------------------------------------------------------------------------------- Multi Wound Chart Details Patient Name: [name], [name] Date of Service: 07/31/2020 1:45 PM Medical Record Number: [id number] Patient Account Number: [id number] Date of Birth/Sex: 1946/07/13 (75 y.o. F) Treating [name]: [name] Primary Care Marquelle Musgrave: [name] Other Clinician: Referring Braiden Rodman: [name] Treating Teran Knittle/Extender: [name] in Treatment: 4 Vital Signs Height(in): 61 Pulse(bpm): 72 Weight(lbs): 175 Blood Pressure(mmHg): 128/84 Body Mass Index(BMI): 33 Temperature(F): 98.1 Respiratory Rate(breaths/min): 16 Photos: [N/A:N/A] Wound Location: Left, Anterior Lower Leg N/A N/A Wounding Event: Puncture N/A N/A Primary Etiology: Skin Tear N/A N/A Comorbid History: Cataracts, Asthma, Hypertension, N/A N/A Osteoarthritis, Received Radiation Date Acquired: 05/18/2020 N/A N/A Weeks of Treatment: 4 N/A N/A Wound Status: Open N/A N/A Measurements L x W x D (cm) 1x0.9x0.3 N/A N/A Area (cm) : 0.707 N/A N/A Volume (cm) : 0.212 N/A N/A % Reduction in Area: 53.90% N/A  N/A % Reduction in Volume: -38.60% N/A N/A Classification: Full Thickness Without Exposed N/A N/A Support Structures Exudate Amount: Medium N/A N/A Exudate Type: Serous N/A N/A Exudate Color: amber N/A N/A Wound Margin: Thickened N/A N/A Granulation Amount: Medium (34-66%) N/A N/A Granulation Quality: Pink, Hyper-granulation N/A N/A Necrotic Amount: Medium (34-66%) N/A N/A Exposed Structures: Fat Layer (Subcutaneous Tissue): N/A N/A Yes Fascia: No Tendon: No Muscle: No Joint: No Bone: No Epithelialization: None  N/A N/A Treatment Notes Electronic Signature(s) Signed: 07/31/2020 5:26:41 PM By: [name] Entered By: [name], BSN, [name], CWS, Cindy on 07/31/2020 13:58:50 [name], [name] ([id number]) -------------------------------------------------------------------------------- [place] Details Patient Name: [name], [name] Date of Service: 07/31/2020 1:45 PM Medical Record Number: [id number] Patient Account Number: [id number] Date of Birth/Sex: [birth year] (75 y.o. F) Treating [name]: [name] Primary Care Chardai Gangemi: [name] Other Clinician: Referring Dejion Grillo: [name] Treating Keria Widrig/Extender: [name] in Treatment: 4 Active Inactive Orientation to the Wound Care Program Nursing Diagnoses: Knowledge deficit related to the wound healing center program Goals: Patient/caregiver will verbalize understanding of the [place] [facility] [place] Program Date Initiated: 07/03/2020 Target Resolution Date: 07/31/2020 Goal Status: Active Interventions: Provide education on orientation to the wound center Notes: Wound/Skin Impairment Nursing Diagnoses: Impaired tissue integrity Goals: Ulcer/skin breakdown will have a volume reduction of 30% by week 4 Date Initiated: 07/03/2020 Target Resolution Date: 07/31/2020 Goal Status: Active Interventions: Assess ulceration(s) every visit Notes: Electronic Signature(s) Signed: 07/31/2020  5:26:41 PM By: [name] Entered By: [name], BSN, [name], CWS, Cindy on 07/31/2020 13:58:41 [name], [name] ([id number]) -------------------------------------------------------------------------------- Pain Assessment Details Patient Name: [name], [name] Date of Service: 07/31/2020 1:45 PM Medical Record Number: [id number] Patient Account Number: [id number] Date of Birth/Sex: November 11, 1945 (75 y.o. F) Treating [name]: [name] Primary Care Refugio Mcconico: [name] Other Clinician: Referring Chaundra Abreu: [name] Treating Jaykub Mackins/Extender: [name] in Treatment: 4 Active Problems Location of Pain Severity and Description of Pain Patient Has Paino Yes Site Locations Character of Pain Describe the Pain: Burning, Tender Pain Management and Medication Current Pain Management: Electronic Signature(s) Signed: 07/31/2020 5:26:41 PM By: [name] Entered By: [name], BSN, [name], CWS, Cindy on 07/31/2020 13:49:12 [place], [name] ([id number]) -------------------------------------------------------------------------------- Patient/Caregiver Education Details Patient Name: [name] Date of Service: 07/31/2020 1:45 PM Medical Record Number: [id number] Patient Account Number: [id number] Date of Birth/Gender: 1945-12-11 (75 y.o. F) Treating [name]: [name] Primary Care Physician: [name] Other Clinician: Referring Physician: Cletis [name] Treating Physician/Extender: [name] in Treatment: 4 Education Assessment Education Provided To: Patient Education Topics Provided Wound/Skin Impairment: Handouts: Caring for Your Ulcer Methods: Demonstration, Explain/Verbal Responses: State content correctly Electronic Signature(s) Signed: 07/31/2020 5:26:41 PM By: [name] Entered By: [name], BSN, [name], CWS, Cindy on 07/31/2020 14:08:04 [name]  ([id number]) -------------------------------------------------------------------------------- Wound Assessment Details Patient Name: [name], [name] Date of Service: 07/31/2020 1:45 PM Medical Record Number: [id number] Patient Account Number: [id number] Date of Birth/Sex: 06-03-1946 (75 y.o. F) Treating [name]: [name] Primary Care Joci Dress: [name] Other Clinician: Referring Angelic Schnelle: [name] Treating Cornelious Diven/Extender: [name] in Treatment: 4 Wound Status Wound Number: 1 Primary Skin Tear Etiology: Wound Location: Left, Anterior Lower Leg Wound Status: Open Wounding Event: Puncture Comorbid Cataracts, Asthma, Hypertension, Osteoarthritis, Date Acquired: 05/18/2020 History: Received Radiation Weeks Of Treatment: 4 Clustered Wound: No Photos Wound Measurements Length: (cm) 1 Width: (cm) 0.9 Depth: (cm) 0.3 Area: (cm) 0.707 Volume: (cm) 0.212 % Reduction in Area: 53.9% % Reduction in Volume: -38.6% Epithelialization: None Tunneling: No Undermining: Yes Starting Position (o'clock): 12 Ending Position (o'clock): 4 Maximum Distance: (cm) 0.4 Wound Description Classification: Full Thickness Without Exposed Support Structu Wound Margin: Thickened Exudate Amount: Medium Exudate Type: Serous Exudate Color: amber res Foul Odor After Cleansing: No Slough/Fibrino Yes Wound Bed Granulation Amount: Medium (34-66%) Exposed Structure Granulation Quality: Pink, Hyper-granulation Fascia Exposed: No  Necrotic Amount: Medium (34-66%) Fat Layer (Subcutaneous Tissue) Exposed: Yes Necrotic Quality: Adherent Slough Tendon Exposed: No Muscle Exposed: No Joint Exposed: No Bone Exposed: No Treatment Notes Wound #1 (Lower Leg) Wound Laterality: Left, Anterior Cleanser [name], [name] C. ([id number]) Peri-Wound Care Topical Primary Dressing Prisma 4.34 (in) Quantity: 1 Discharge Instruction: Moisten w/normal saline or sterile water; Cover wound as  directed. Do not remove from wound bed. Secondary Dressing Gauze Quantity: 1 Discharge Instruction: As directed: dry, moistened with saline or moistened with Dakins Solution Secured With Curad Paper Tape, 1x10 (in/yd) Quantity: 2 Compression Wrap Compression Stockings Add-Ons Electronic Signature(s) Signed: 07/31/2020 5:26:41 PM By: [name] Entered By: [name], BSN, [name], CWS, Cindy on 07/31/2020 13:59:44 Brallier, [name] ([id number]) -------------------------------------------------------------------------------- Vitals Details Patient Name: [name] Date of Service: 07/31/2020 1:45 PM Medical Record Number: [id number] Patient Account Number: [id number] Date of Birth/Sex: 01-20-1946 (75 y.o. F) Treating [name]: [name] Primary Care Breccan Galant: [name] Other Clinician: Referring Ellice Boultinghouse: [name] Treating Akylah Hascall/Extender: [name] in Treatment: 4 Vital Signs Time Taken: 13:46 Temperature (F): 98.1 Height (in): 61 Pulse (bpm): 79 Weight (lbs): 175 Respiratory Rate (breaths/min): 16 Body Mass Index (BMI): 33.1 Blood Pressure (mmHg): 128/84 Reference Range: 80 - 120 mg / dl Electronic Signature(s) Signed: 07/31/2020 5:26:41 PM By: [name] Entered By: [name], BSN, [name], CWS, Cindy on 07/31/2020 13:48:53

## 2020-07-31 NOTE — Progress Notes (Addendum)
LORI, POPOWSKI ([id number]) Visit Report for 07/31/2020 Chief Complaint Document Details Patient Name: [name], [name]. Date of Service: 07/31/2020 1:45 PM Medical Record Number: [id number] Patient Account Number: [id number] Date of Birth/Sex: 07/19/1946 (75 y.o. F) Treating RN: Cornell Barman Primary Care Provider: [name] Other Clinician: Referring Provider: [name] Treating Provider/Extender: [name] in Treatment: 4 Information Obtained from: Patient Chief Complaint Left LE Ulcer Electronic Signature(s) Signed: 07/31/2020 1:57:53 PM By: Worthy Keeler PA-[name] Entered By: Worthy Keeler on 07/31/2020 13:57:53 [name], Cindy CMarland Kitchen ([id number]) -------------------------------------------------------------------------------- HPI Details Patient Name: Cindy Ohm [name]. Date of Service: 07/31/2020 1:45 PM Medical Record Number: [id number] Patient Account Number: [id number] Date of Birth/Sex: 06-29-1946 (75 y.o. F) Treating RN: Cornell Barman Primary Care Provider: [name] Other Clinician: Referring Provider: [name] Treating Provider/Extender: [name] in Treatment: 4 History of Present Illness HPI Description: 07/03/2020 upon evaluation today patient appears to be doing unfortunately somewhat poorly in regard to her leg where she struck this causing a wound 05/18/2020. She states that since that time she has had mainly an eschar covering sometimes it will drain sometimes not its been infected a couple times as well and she has been on several antibiotics including 5 days left of doxycycline that she is currently taking. No sharp debridement has been undertaken by anyone at this point. She tells me that she does have a history of leg swelling she is supposed to wear compression socks but she does not always do this. She also tells me that she does have hypertension but otherwise no major medical problems. She has not really been doing anything  compression wise since this was injured in October. 07/10/2020 on evaluation today patient actually appears to be making some progress here. Fortunately there is no signs of active infection systemically locally she still has some evidence of erythema infection although this is minimal compared to what it was even last week. Overall I am pleased in that regard. There does not appear to be any signs of active infection spreading outside aware of marked that is good news as well. In general the patient seems to be doing well although I think we already continue to probably treat her we likely need to switch her to Bactrim DS as the doxycycline is resistant that is what she was taking as of last week. 07/17/2020 upon evaluation today patient appears to be doing excellent in regard to her leg ulcer. In fact I think we can probably switch to collagen-based dressing at this point as she seems to be doing so well. There is no evidence of infection at this time and I think that she is making good progress. I do want her to continue with the oral antibiotics at this point. 07/31/2020 upon evaluation today patient appears to be doing quite well in regard to her lower extremity ulcer. She has been tolerating the dressing changes without complication. Fortunately there is no evidence of active infection at this time. I been very pleased with where things stand currently. No fevers, chills, nausea, vomiting, or diarrhea. Electronic Signature(s) Signed: 07/31/2020 4:24:55 PM By: Worthy Keeler PA-[name] Entered By: Worthy Keeler on 07/31/2020 16:24:55 [name], [name] ([id number]) -------------------------------------------------------------------------------- Physical Exam Details Patient Name: [name], [name]. Date of Service: 07/31/2020 1:45 PM Medical Record Number: [id number] Patient Account Number: [id number] Date of Birth/Sex: [birth year] (75 y.o. F) Treating RN: Cornell Barman Primary Care Provider: [name] Other Clinician: Referring Provider: [name] Treating Provider/Extender: Jeri Cos  Weeks in Treatment: 4 Constitutional Well-nourished and well-hydrated in no acute distress. Respiratory normal breathing without difficulty. Psychiatric this patient is able to make decisions and demonstrates good insight into disease process. Alert and Oriented x 3. pleasant and cooperative. Notes Patient's wound currently had minimal slough noted which was mechanically debrided away with saline and gauze no sharp debridement necessary today. Her undermining is dramatically improved and overall I feel like she is making excellent progress. We are getting much closer to complete resolution here. Electronic Signature(s) Signed: 07/31/2020 4:25:12 PM By: Worthy Keeler PA-[name] Entered By: Worthy Keeler on 07/31/2020 16:25:12 Maners, Jefferson. ([id number]) -------------------------------------------------------------------------------- Physician Orders Details Patient Name: Cindy Ohm [name]. Date of Service: 07/31/2020 1:45 PM Medical Record Number: [id number] Patient Account Number: [id number] Date of Birth/Sex: 10/07/1945 (75 y.o. F) Treating RN: Cornell Barman Primary Care Provider: [name] Other Clinician: Referring Provider: [name] Treating Provider/Extender: [name] in Treatment: 4 Verbal / Phone Orders: No Diagnosis Coding ICD-10 Coding Code Description I87.2 Venous insufficiency (chronic) (peripheral) S81.802A Unspecified open wound, left lower leg, initial encounter L97.822 Non-pressure chronic ulcer of other part of left lower leg with fat layer exposed [place] (primary) hypertension Follow-up Appointments o Return Appointment in 1 week. Bathing/ Shower/ Hygiene o May shower; gently cleanse wound with antibacterial soap, rinse and pat dry prior to dressing wounds Edema Control - Lymphedema / Segmental Compressive Device / Other o Patient to  wear own compression stockings. o Elevate, Exercise Daily and Avoid Standing for Long Periods of Time. Wound Treatment Wound #1 - Lower Leg Wound Laterality: Left, Anterior Primary Dressing: Prisma 4.34 (in) Discharge Instructions: Moisten w/normal saline or sterile water; Cover wound as directed. Do not remove from wound bed. Secondary Dressing: Gauze Discharge Instructions: As directed: dry, moistened with saline or moistened with Dakins Solution Secured With: Curad Paper Tape, 1x10 (in/yd) Electronic Signature(s) Signed: 07/31/2020 4:17:09 PM By: Worthy Keeler PA-[name] Signed: 07/31/2020 5:26:41 PM By: Gretta Cool, BSN, RN, CWS, Kim RN, BSN Entered By: Gretta Cool, BSN, RN, CWS, Kim on 07/31/2020 14:02:37 [name], [name] ([id number]) -------------------------------------------------------------------------------- Problem List Details Patient Name: [name], Everlie [name]. Date of Service: 07/31/2020 1:45 PM Medical Record Number: [id number] Patient Account Number: [id number] Date of Birth/Sex: 05-Sep-1945 (74 y.o. F) Treating RN: Cornell Barman Primary Care Provider: [name] Other Clinician: Referring Provider: [name] Treating Provider/Extender: [name] in Treatment: 4 Active Problems ICD-10 Encounter Code Description Active Date MDM Diagnosis I87.2 Venous insufficiency (chronic) (peripheral) 07/03/2020 No Yes S81.802A Unspecified open wound, left lower leg, initial encounter 07/03/2020 No Yes L97.822 Non-pressure chronic ulcer of other part of left lower leg with fat layer 07/03/2020 No Yes exposed [place] (primary) hypertension 07/03/2020 No Yes Inactive Problems Resolved Problems Electronic Signature(s) Signed: 07/31/2020 1:57:47 PM By: Worthy Keeler PA-[name] Entered By: Worthy Keeler on 07/31/2020 13:57:47 [name], [name]. ([id number]) -------------------------------------------------------------------------------- Progress Note Details Patient Name: [name],  [name]. Date of Service: 07/31/2020 1:45 PM Medical Record Number: [id number] Patient Account Number: [id number] Date of Birth/Sex: [birth year] (74 y.o. F) Treating RN: Cornell Barman Primary Care Provider: [name] Other Clinician: Referring Provider: [name] Treating Provider/Extender: [name] in Treatment: 4 Subjective Chief Complaint Information obtained from Patient Left LE Ulcer History of Present Illness (HPI) 07/03/2020 upon evaluation today patient appears to be doing unfortunately somewhat poorly in regard to her leg where she struck this causing a wound 05/18/2020. She states that since that time she has had mainly an eschar covering  sometimes it will drain sometimes not its been infected a couple times as well and she has been on several antibiotics including 5 days left of doxycycline that she is currently taking. No sharp debridement has been undertaken by anyone at this point. She tells me that she does have a history of leg swelling she is supposed to wear compression socks but she does not always do this. She also tells me that she does have hypertension but otherwise no major medical problems. She has not really been doing anything compression wise since this was injured in October. 07/10/2020 on evaluation today patient actually appears to be making some progress here. Fortunately there is no signs of active infection systemically locally she still has some evidence of erythema infection although this is minimal compared to what it was even last week. Overall I am pleased in that regard. There does not appear to be any signs of active infection spreading outside aware of marked that is good news as well. In general the patient seems to be doing well although I think we already continue to probably treat her we likely need to switch her to Bactrim DS as the doxycycline is resistant that is what she was taking as of last week. 07/17/2020 upon evaluation today  patient appears to be doing excellent in regard to her leg ulcer. In fact I think we can probably switch to collagen-based dressing at this point as she seems to be doing so well. There is no evidence of infection at this time and I think that she is making good progress. I do want her to continue with the oral antibiotics at this point. 07/31/2020 upon evaluation today patient appears to be doing quite well in regard to her lower extremity ulcer. She has been tolerating the dressing changes without complication. Fortunately there is no evidence of active infection at this time. I been very pleased with where things stand currently. No fevers, chills, nausea, vomiting, or diarrhea. Objective Constitutional Well-nourished and well-hydrated in no acute distress. Vitals Time Taken: 1:46 PM, Height: 61 in, Weight: 175 lbs, BMI: 33.1, Temperature: 98.1 F, Pulse: 79 bpm, Respiratory Rate: 16 breaths/min, Blood Pressure: 128/84 mmHg. Respiratory normal breathing without difficulty. Psychiatric this patient is able to make decisions and demonstrates good insight into disease process. Alert and Oriented x 3. pleasant and cooperative. General Notes: Patient's wound currently had minimal slough noted which was mechanically debrided away with saline and gauze no sharp debridement necessary today. Her undermining is dramatically improved and overall I feel like she is making excellent progress. We are getting much closer to complete resolution here. Integumentary (Hair, Skin) Wound #1 status is Open. Original cause of wound was Puncture. The wound is located on the Left,Anterior Lower Leg. The wound measures 1cm length x 0.9cm width x 0.3cm depth; 0.707cm^2 area and 0.212cm^3 volume. There is Fat Layer (Subcutaneous Tissue) exposed. There is no tunneling noted, however, there is undermining starting at 12:00 and ending at 4:00 with a maximum distance of 0.4cm. There is a medium amount of serous drainage  noted. The wound margin is thickened. There is medium (34-66%) pink, hyper - granulation within the wound bed. There is a medium (34-66%) amount of necrotic tissue within the wound bed including Adherent Slough. [name], [name] ([id number]) Assessment Active Problems ICD-10 Venous insufficiency (chronic) (peripheral) Unspecified open wound, left lower leg, initial encounter Non-pressure chronic ulcer of other part of left lower leg with fat layer exposed [place] (primary) hypertension Plan Follow-up Appointments:  Return Appointment in 1 week. Bathing/ Shower/ Hygiene: May shower; gently cleanse wound with antibacterial soap, rinse and pat dry prior to dressing wounds Edema Control - Lymphedema / Segmental Compressive Device / Other: Patient to wear own compression stockings. Elevate, Exercise Daily and Avoid Standing for Long Periods of Time. WOUND #1: - Lower Leg Wound Laterality: Left, Anterior Primary Dressing: Prisma 4.34 (in) Discharge Instructions: Moisten w/normal saline or sterile water; Cover wound as directed. Do not remove from wound bed. Secondary Dressing: Gauze Discharge Instructions: As directed: dry, moistened with saline or moistened with Dakins Solution Secured With: Curad Paper Tape, 1x10 (in/yd) 1. Would recommend currently that we go ahead and continue with the wound care measures as before. This includes the silver collagen dressing which I think has been beneficial for her. 2. I would recommend as well that she cover this with gauze and then paper tape to secure in place. 3. I am also can recommend the patient continue to elevate her legs much as possible and use her compression socks. We will see patient back for reevaluation in 1 week here in the clinic. If anything worsens or changes patient will contact our office for additional recommendations. Electronic Signature(s) Signed: 07/31/2020 4:26:16 PM By: Worthy Keeler PA-[name] Entered By: Worthy Keeler on  07/31/2020 16:26:16 [name], Cindy CMarland Kitchen ([id number]) -------------------------------------------------------------------------------- SuperBill Details Patient Name: Cindy Ohm [name]. Date of Service: 07/31/2020 Medical Record Number: [id number] Patient Account Number: [id number] Date of Birth/Sex: 02/01/1946 (74 y.o. F) Treating RN: Cornell Barman Primary Care Provider: [name] Other Clinician: Referring Provider: [name] Treating Provider/Extender: [name] in Treatment: 4 Diagnosis Coding ICD-10 Codes Code Description I87.2 Venous insufficiency (chronic) (peripheral) S81.802A Unspecified open wound, left lower leg, initial encounter L97.822 Non-pressure chronic ulcer of other part of left lower leg with fat layer exposed [place] (primary) hypertension Facility Procedures CPT4 Code: 58099833 Description: 82505 - WOUND CARE VISIT-LEV 3 EST PT Modifier: Quantity: 1 Physician Procedures CPT4 Code: 3976734 Description: 19379 - WC PHYS LEVEL 3 - EST PT Modifier: Quantity: 1 CPT4 Code: Description: ICD-10 Diagnosis Description I87.2 Venous insufficiency (chronic) (peripheral) S81.802A Unspecified open wound, left lower leg, initial encounter L97.822 Non-pressure chronic ulcer of other part of left lower leg with fat lay [place]  (primary) hypertension Modifier: er exposed Quantity: Electronic Signature(s) Signed: 07/31/2020 4:26:38 PM By: Worthy Keeler PA-[name] Entered By: Worthy Keeler on 07/31/2020 16:26:38

## 2020-08-06 ENCOUNTER — Other Ambulatory Visit: Payer: Self-pay | Admitting: Internal Medicine

## 2020-08-06 DIAGNOSIS — J4540 Moderate persistent asthma, uncomplicated: Secondary | ICD-10-CM | POA: Diagnosis not present

## 2020-08-06 DIAGNOSIS — L299 Pruritus, unspecified: Secondary | ICD-10-CM | POA: Diagnosis not present

## 2020-08-06 DIAGNOSIS — J301 Allergic rhinitis due to pollen: Secondary | ICD-10-CM | POA: Diagnosis not present

## 2020-08-06 DIAGNOSIS — J3089 Other allergic rhinitis: Secondary | ICD-10-CM | POA: Diagnosis not present

## 2020-08-07 ENCOUNTER — Encounter: Payer: Medicare Other | Admitting: Physician Assistant

## 2020-08-07 ENCOUNTER — Other Ambulatory Visit: Payer: Self-pay

## 2020-08-07 DIAGNOSIS — L97822 Non-pressure chronic ulcer of other part of left lower leg with fat layer exposed: Secondary | ICD-10-CM | POA: Diagnosis not present

## 2020-08-07 DIAGNOSIS — I872 Venous insufficiency (chronic) (peripheral): Secondary | ICD-10-CM | POA: Diagnosis not present

## 2020-08-07 DIAGNOSIS — S81832A Puncture wound without foreign body, left lower leg, initial encounter: Secondary | ICD-10-CM | POA: Diagnosis not present

## 2020-08-07 NOTE — Progress Notes (Addendum)
[name], [name] ([id number]) Visit Report for 08/07/2020 Chief Complaint Document Details Patient Name: [name], [name]. Date of Service: 08/07/2020 11:00 AM Medical Record Number: [id number] Patient Account Number: [id number] Date of Birth/Sex: 16-Apr-1946 (75 y.o. F) Treating RN: Cornell Barman Primary Care Provider: [name] Other Clinician: Referring Provider: [name] Treating Provider/Extender: [name] in Treatment: 5 Information Obtained from: Patient Chief Complaint Left LE Ulcer Electronic Signature(s) Signed: 08/07/2020 11:45:22 AM By: Worthy Keeler PA-[name] Entered By: Worthy Keeler on 08/07/2020 11:45:22 [name], Cindy CMarland Kitchen ([id number]) -------------------------------------------------------------------------------- HPI Details Patient Name: Cindy Ohm [name]. Date of Service: 08/07/2020 11:00 AM Medical Record Number: [id number] Patient Account Number: [id number] Date of Birth/Sex: [birth year] (75 y.o. F) Treating RN: Cornell Barman Primary Care Provider: [name] Other Clinician: Referring Provider: [name] Treating Provider/Extender: [name] in Treatment: 5 History of Present Illness HPI Description: 07/03/2020 upon evaluation today patient appears to be doing unfortunately somewhat poorly in regard to her leg where she struck this causing a wound 05/18/2020. She states that since that time she has had mainly an eschar covering sometimes it will drain sometimes not its been infected a couple times as well and she has been on several antibiotics including 5 days left of doxycycline that she is currently taking. No sharp debridement has been undertaken by anyone at this point. She tells me that she does have a history of leg swelling she is supposed to wear compression socks but she does not always do this. She also tells me that she does have hypertension but otherwise no major medical problems. She has not really been doing anything  compression wise since this was injured in October. 07/10/2020 on evaluation today patient actually appears to be making some progress here. Fortunately there is no signs of active infection systemically locally she still has some evidence of erythema infection although this is minimal compared to what it was even last week. Overall I am pleased in that regard. There does not appear to be any signs of active infection spreading outside aware of marked that is good news as well. In general the patient seems to be doing well although I think we already continue to probably treat her we likely need to switch her to Bactrim DS as the doxycycline is resistant that is what she was taking as of last week. 07/17/2020 upon evaluation today patient appears to be doing excellent in regard to her leg ulcer. In fact I think we can probably switch to collagen-based dressing at this point as she seems to be doing so well. There is no evidence of infection at this time and I think that she is making good progress. I do want her to continue with the oral antibiotics at this point. 07/31/2020 upon evaluation today patient appears to be doing quite well in regard to her lower extremity ulcer. She has been tolerating the dressing changes without complication. Fortunately there is no evidence of active infection at this time. I been very pleased with where things stand currently. No fevers, chills, nausea, vomiting, or diarrhea. 08/07/2020 on evaluation today patient appears to continue to make signs of good improvement with regard to her wound. I am very pleased with where things stand this is measuring smaller and overall I think she is doing excellent. Electronic Signature(s) Signed: 08/07/2020 12:02:31 PM By: Worthy Keeler PA-[name] Entered By: Worthy Keeler on 08/07/2020 12:02:31 [name], [name] ([id number]) -------------------------------------------------------------------------------- Physical Exam  Details Patient Name: [name], [name]. Date  of Service: 08/07/2020 11:00 AM Medical Record Number: [id number] Patient Account Number: [id number] Date of Birth/Sex: 09/14/1945 (75 y.o. F) Treating RN: Cornell Barman Primary Care Provider: [name] Other Clinician: Referring Provider: [name] Treating Provider/Extender: [name] in Treatment: 5 Constitutional Well-nourished and well-hydrated in no acute distress. Respiratory normal breathing without difficulty. Psychiatric this patient is able to make decisions and demonstrates good insight into disease process. Alert and Oriented x 3. pleasant and cooperative. Notes Patient's wound bed actually showed signs of excellent granulation and epithelization overall I feel like that she is tolerating the dressing changes well without complication and in general I am extremely pleased with where things stand today. Electronic Signature(s) Signed: 08/07/2020 12:02:48 PM By: Worthy Keeler PA-[name] Entered By: Worthy Keeler on 08/07/2020 12:02:48 [name] ([id number]) -------------------------------------------------------------------------------- Physician Orders Details Patient Name: Cindy Ohm [name]. Date of Service: 08/07/2020 11:00 AM Medical Record Number: [id number] Patient Account Number: [id number] Date of Birth/Sex: 01/08/1946 (75 y.o. F) Treating RN: [name] Primary Care Provider: [name] Other Clinician: Referring Provider: [name] Treating Provider/Extender: [name] in Treatment: 5 Verbal / Phone Orders: No Diagnosis Coding ICD-10 Coding Code Description I87.2 Venous insufficiency (chronic) (peripheral) S81.802A Unspecified open wound, left lower leg, initial encounter L97.822 Non-pressure chronic ulcer of other part of left lower leg with fat layer exposed [place] (primary) hypertension Follow-up Appointments o Return Appointment in 1 week. Bathing/ Shower/  Hygiene o May shower; gently cleanse wound with antibacterial soap, rinse and pat dry prior to dressing wounds Edema Control - Lymphedema / Segmental Compressive Device / Other o Patient to wear own compression stockings. o Elevate, Exercise Daily and Avoid Standing for Long Periods of Time. Wound Treatment Wound #1 - Lower Leg Wound Laterality: Left, Anterior Primary Dressing: Prisma 4.34 (in) (Generic) 3 x Per Week/30 Days Discharge Instructions: Moisten w/normal saline or sterile water; Cover wound as directed. Do not remove from wound bed. Secondary Dressing: Gauze (Generic) 3 x Per Week/30 Days Discharge Instructions: As directed: dry, moistened with saline or moistened with Dakins Solution Secured With: Curad Paper Tape, 1x10 (in/yd) (Generic) 3 x Per Week/30 Days Electronic Signature(s) Signed: 08/07/2020 1:43:02 PM By: Worthy Keeler PA-[name] Signed: 08/07/2020 2:10:41 PM By: [name], [name] Entered By: [name], Kenia on 08/07/2020 12:04:57 [name], [name]. ([id number]) -------------------------------------------------------------------------------- Problem List Details Patient Name: [name], [name]. Date of Service: 08/07/2020 11:00 AM Medical Record Number: [id number] Patient Account Number: [id number] Date of Birth/Sex: Mar 23, 1946 (75 y.o. F) Treating RN: Cornell Barman Primary Care Provider: [name] Other Clinician: Referring Provider: [name] Treating Provider/Extender: [name] in Treatment: 5 Active Problems ICD-10 Encounter Code Description Active Date MDM Diagnosis I87.2 Venous insufficiency (chronic) (peripheral) 07/03/2020 No Yes S81.802A Unspecified open wound, left lower leg, initial encounter 07/03/2020 No Yes L97.822 Non-pressure chronic ulcer of other part of left lower leg with fat layer 07/03/2020 No Yes exposed [place] (primary) hypertension 07/03/2020 No Yes Inactive Problems Resolved Problems Electronic  Signature(s) Signed: 08/07/2020 11:45:10 AM By: Worthy Keeler PA-[name] Entered By: Worthy Keeler on 08/07/2020 11:45:10 [name], [name]. ([id number]) -------------------------------------------------------------------------------- Progress Note Details Patient Name: [name], Smantha [name]. Date of Service: 08/07/2020 11:00 AM Medical Record Number: [id number] Patient Account Number: [id number] Date of Birth/Sex: September 20, 1945 (75 y.o. F) Treating RN: Cornell Barman Primary Care Provider: [name] Other Clinician: Referring Provider: [name] Treating Provider/Extender: [name] in Treatment: 5 Subjective Chief Complaint Information obtained from Patient Left LE  Ulcer History of Present Illness (HPI) 07/03/2020 upon evaluation today patient appears to be doing unfortunately somewhat poorly in regard to her leg where she struck this causing a wound 05/18/2020. She states that since that time she has had mainly an eschar covering sometimes it will drain sometimes not its been infected a couple times as well and she has been on several antibiotics including 5 days left of doxycycline that she is currently taking. No sharp debridement has been undertaken by anyone at this point. She tells me that she does have a history of leg swelling she is supposed to wear compression socks but she does not always do this. She also tells me that she does have hypertension but otherwise no major medical problems. She has not really been doing anything compression wise since this was injured in October. 07/10/2020 on evaluation today patient actually appears to be making some progress here. Fortunately there is no signs of active infection systemically locally she still has some evidence of erythema infection although this is minimal compared to what it was even last week. Overall I am pleased in that regard. There does not appear to be any signs of active infection spreading outside aware of marked  that is good news as well. In general the patient seems to be doing well although I think we already continue to probably treat her we likely need to switch her to Bactrim DS as the doxycycline is resistant that is what she was taking as of last week. 07/17/2020 upon evaluation today patient appears to be doing excellent in regard to her leg ulcer. In fact I think we can probably switch to collagen-based dressing at this point as she seems to be doing so well. There is no evidence of infection at this time and I think that she is making good progress. I do want her to continue with the oral antibiotics at this point. 07/31/2020 upon evaluation today patient appears to be doing quite well in regard to her lower extremity ulcer. She has been tolerating the dressing changes without complication. Fortunately there is no evidence of active infection at this time. I been very pleased with where things stand currently. No fevers, chills, nausea, vomiting, or diarrhea. 08/07/2020 on evaluation today patient appears to continue to make signs of good improvement with regard to her wound. I am very pleased with where things stand this is measuring smaller and overall I think she is doing excellent. Objective Constitutional Well-nourished and well-hydrated in no acute distress. Vitals Time Taken: 11:40 AM, Height: 61 in, Weight: 175 lbs, BMI: 33.1, Temperature: 98.4 F, Pulse: 71 bpm, Respiratory Rate: 16 breaths/min, Blood Pressure: 149/88 mmHg. Respiratory normal breathing without difficulty. Psychiatric this patient is able to make decisions and demonstrates good insight into disease process. Alert and Oriented x 3. pleasant and cooperative. General Notes: Patient's wound bed actually showed signs of excellent granulation and epithelization overall I feel like that she is tolerating the dressing changes well without complication and in general I am extremely pleased with where things stand  today. Integumentary (Hair, Skin) Wound #1 status is Open. Original cause of wound was Puncture. The wound is located on the Left,Anterior Lower Leg. The wound measures 0.9cm length x 0.7cm width x 0.2cm depth; 0.495cm^2 area and 0.099cm^3 volume. There is Fat Layer (Subcutaneous Tissue) exposed. There is no tunneling or undermining noted. There is a medium amount of serous drainage noted. The wound margin is thickened. There is small (1-33%) pink, pale granulation within  the wound bed. There is a large (67-100%) amount of necrotic tissue within the wound bed including Adherent Slough. ULANDA, TACKETT ([id number]) Assessment Active Problems ICD-10 Venous insufficiency (chronic) (peripheral) Unspecified open wound, left lower leg, initial encounter Non-pressure chronic ulcer of other part of left lower leg with fat layer exposed [place] (primary) hypertension Plan Follow-up Appointments: Return Appointment in 1 week. Bathing/ Shower/ Hygiene: May shower; gently cleanse wound with antibacterial soap, rinse and pat dry prior to dressing wounds Edema Control - Lymphedema / Segmental Compressive Device / Other: Patient to wear own compression stockings. Elevate, Exercise Daily and Avoid Standing for Long Periods of Time. WOUND #1: - Lower Leg Wound Laterality: Left, Anterior Primary Dressing: Prisma 4.34 (in) (Generic) 3 x Per Week/30 Days Discharge Instructions: Moisten w/normal saline or sterile water; Cover wound as directed. Do not remove from wound bed. Secondary Dressing: Gauze (Generic) 3 x Per Week/30 Days Discharge Instructions: As directed: dry, moistened with saline or moistened with Dakins Solution Secured With: Curad Paper Tape, 1x10 (in/yd) (Generic) 3 x Per Week/30 Days 1. I would recommend currently that we continue with wound care measures as before she is in agreement with the plan this includes the use of the silver collagen. 2. She will continue to cover this with  a nonstick pad secured with tape. 3. She will also continue to use her compression stocking as well. We will see patient back for reevaluation in 1 week here in the clinic. If anything worsens or changes patient will contact our office for additional recommendations. Electronic Signature(s) Signed: 08/07/2020 12:03:22 PM By: Worthy Keeler PA-[name] Entered By: Worthy Keeler on 08/07/2020 12:03:22 [place], [name] ([id number]) -------------------------------------------------------------------------------- SuperBill Details Patient Name: Cindy Ohm [name]. Date of Service: 08/07/2020 Medical Record Number: [id number] Patient Account Number: [id number] Date of Birth/Sex: 02/13/1946 (75 y.o. F) Treating RN: [name] Primary Care Provider: [name] Other Clinician: Referring Provider: [name] Treating Provider/Extender: [name] in Treatment: 5 Diagnosis Coding ICD-10 Codes Code Description I87.2 Venous insufficiency (chronic) (peripheral) S81.802A Unspecified open wound, left lower leg, initial encounter L97.822 Non-pressure chronic ulcer of other part of left lower leg with fat layer exposed [place] (primary) hypertension Facility Procedures CPT4 Code: [id number] Description: 16073 - WOUND CARE VISIT-LEV 3 EST PT Modifier: Quantity: 1 Physician Procedures CPT4 Code: 7106269 Description: 48546 - WC PHYS LEVEL 3 - EST PT Modifier: Quantity: 1 CPT4 Code: Description: ICD-10 Diagnosis Description I87.2 Venous insufficiency (chronic) (peripheral) S81.802A Unspecified open wound, left lower leg, initial encounter L97.822 Non-pressure chronic ulcer of other part of left lower leg with fat lay [place]  (primary) hypertension Modifier: er exposed Quantity: Electronic Signature(s) Signed: 08/07/2020 12:03:33 PM By: Worthy Keeler PA-[name] Entered By: Worthy Keeler on 08/07/2020 12:03:33

## 2020-08-07 NOTE — Progress Notes (Signed)
TANAKA, GILLEN ([id number]) Visit Report for 08/07/2020 Arrival Information Details Patient Name: [name], [name]. Date of Service: 08/07/2020 11:00 AM Medical Record Number: [id number] Patient Account Number: [id number] Date of Birth/Sex: March 21, 1946 (74 y.o. F) Treating RN: Cornell Barman Primary Care Huxley Shurley: [name] Other Clinician: Referring Nadra Hritz: [name] Treating Alinda Egolf/Extender: [name] in Treatment: 5 Visit Information History Since Last Visit Added or deleted any medications: No Patient Arrived: Ambulatory Any new allergies or adverse reactions: No Arrival Time: 11:36 Had a fall or experienced change in No Accompanied By: self activities of daily living that may affect Transfer Assistance: None risk of falls: Patient Identification Verified: Yes Signs or symptoms of abuse/neglect since last visito No Secondary Verification Process Completed: Yes Hospitalized since last visit: No Implantable device outside of the clinic excluding No cellular tissue based products placed in the center since last visit: Has Dressing in Place as Prescribed: Yes Pain Present Now: Yes Electronic Signature(s) Signed: 08/07/2020 4:14:11 PM By: Lorine Bears RCP, RRT, CHT Entered By: Lorine Bears on 08/07/2020 11:37:15 Blizzard, Yaretzi CMarland Kitchen ([id number]) -------------------------------------------------------------------------------- Clinic Level of Care Assessment Details Patient Name: [name], [name] Date of Service: 08/07/2020 11:00 AM Medical Record Number: [id number] Patient Account Number: [id number] Date of Birth/Sex: 02/02/1946 (74 y.o. F) Treating RN: [name] Primary Care Bahar Shelden: [name] Other Clinician: Referring Keyri Salberg: [name] Treating Alvy Alsop/Extender: [name] in Treatment: 5 Clinic Level of Care Assessment Items TOOL 4 Quantity Score X - Use when only an EandM is performed on  FOLLOW-UP visit 1 0 ASSESSMENTS - Nursing Assessment / Reassessment X - Reassessment of Co-morbidities (includes updates in patient status) 1 10 X- 1 5 Reassessment of Adherence to Treatment Plan ASSESSMENTS - Wound and Skin Assessment / Reassessment X - Simple Wound Assessment / Reassessment - one wound 1 5 []  - 0 Complex Wound Assessment / Reassessment - multiple wounds []  - 0 Dermatologic / Skin Assessment (not related to wound area) ASSESSMENTS - Focused Assessment []  - Circumferential Edema Measurements - multi extremities 0 []  - 0 Nutritional Assessment / Counseling / Intervention []  - 0 Lower Extremity Assessment (monofilament, tuning fork, pulses) []  - 0 Peripheral Arterial Disease Assessment (using hand held doppler) ASSESSMENTS - Ostomy and/or Continence Assessment and Care []  - Incontinence Assessment and Management 0 []  - 0 Ostomy Care Assessment and Management (repouching, etc.) PROCESS - Coordination of Care X - Simple Patient / Family Education for ongoing care 1 15 []  - 0 Complex (extensive) Patient / Family Education for ongoing care []  - 0 Staff obtains Programmer, systems, Records, Test Results / Process Orders []  - 0 Staff telephones HHA, Nursing Homes / Clarify orders / etc []  - 0 Routine Transfer to another Facility (non-emergent condition) []  - 0 Routine Hospital Admission (non-emergent condition) []  - 0 New Admissions / Biomedical engineer / Ordering NPWT, Apligraf, etc. []  - 0 Emergency Hospital Admission (emergent condition) X- 1 10 Simple Discharge Coordination []  - 0 Complex (extensive) Discharge Coordination PROCESS - Special Needs []  - Pediatric / Minor Patient Management 0 []  - 0 Isolation Patient Management []  - 0 Hearing / Language / Visual special needs []  - 0 Assessment of Community assistance (transportation, D/C planning, etc.) []  - 0 Additional assistance / Altered mentation []  - 0 Support Surface(s) Assessment (bed, cushion, seat,  etc.) INTERVENTIONS - Wound Cleansing / Measurement Meckes, Maddisyn C. ([id number]) X- 1 5 Simple Wound Cleansing - one wound []  - 0 Complex Wound Cleansing - multiple wounds X- 1  5 Wound Imaging (photographs - any number of wounds) []  - 0 Wound Tracing (instead of photographs) X- 1 5 Simple Wound Measurement - one wound []  - 0 Complex Wound Measurement - multiple wounds INTERVENTIONS - Wound Dressings []  - Small Wound Dressing one or multiple wounds 0 X- 1 15 Medium Wound Dressing one or multiple wounds []  - 0 Large Wound Dressing one or multiple wounds []  - 0 Application of Medications - topical []  - 0 Application of Medications - injection INTERVENTIONS - Miscellaneous []  - External ear exam 0 []  - 0 Specimen Collection (cultures, biopsies, blood, body fluids, etc.) []  - 0 Specimen(s) / Culture(s) sent or taken to Lab for analysis []  - 0 Patient Transfer (multiple staff / Civil Service fast streamer / Similar devices) []  - 0 Simple Staple / Suture removal (25 or less) []  - 0 Complex Staple / Suture removal (26 or more) []  - 0 Hypo / Hyperglycemic Management (close monitor of Blood Glucose) []  - 0 Ankle / Brachial Index (ABI) - do not check if billed separately X- 1 5 Vital Signs Has the patient been seen at the hospital within the last three years: Yes Total Score: 80 Level Of Care: New/Established - Level 3 Electronic Signature(s) Signed: 08/07/2020 2:10:41 PM By: [name], [name] Entered By: [name], Kenia on 08/07/2020 11:56:07 Badolato, Amour CMarland Kitchen ([id number]) -------------------------------------------------------------------------------- Encounter Discharge Information Details Patient Name: Cindy Loop, [name] Date of Service: 08/07/2020 11:00 AM Medical Record Number: [id number] Patient Account Number: [id number] Date of Birth/Sex: 1946/01/13 (74 y.o. F) Treating RN: [name] Primary Care Prudy Candy: [name] Other Clinician: Referring  Edda Orea: [name] Treating Idalys Konecny/Extender: [name] in Treatment: 5 Encounter Discharge Information Items Discharge Condition: Stable Ambulatory Status: Ambulatory Discharge Destination: Home Transportation: Private Auto Accompanied By: self Schedule Follow-up Appointment: Yes Clinical Summary of Care: Electronic Signature(s) Signed: 08/07/2020 2:10:41 PM By: [name], [name] Entered By: [name], Kenia on 08/07/2020 11:56:47 Paolucci, Anouk CMarland Kitchen ([id number]) -------------------------------------------------------------------------------- Lower Extremity Assessment Details Patient Name: Faddis, Corrisa C. Date of Service: 08/07/2020 11:00 AM Medical Record Number: [id number] Patient Account Number: [id number] Date of Birth/Sex: 1946/06/12 (74 y.o. F) Treating RN: [name] Primary Care Rihaan Barrack: [name] Other Clinician: Referring Tamakia Porto: [name] Treating Rayshawn Maney/Extender: Jeri Cos Weeks in Treatment: 5 Edema Assessment Assessed: [Left: Yes] [Right: No] Edema: [Left: N] [Right: o] Vascular Assessment Pulses: Dorsalis Pedis Palpable: [Left:Yes] Electronic Signature(s) Signed: 08/07/2020 2:10:41 PM By: [name], [name] Entered By: [name], Minus Breeding on 08/07/2020 11:54:36 Beals, Lorrin C. ([id number]) -------------------------------------------------------------------------------- Multi Wound Chart Details Patient Name: Cindy Loop, Kadisha C. Date of Service: 08/07/2020 11:00 AM Medical Record Number: [id number] Patient Account Number: [id number] Date of Birth/Sex: 09-04-1945 (74 y.o. F) Treating RN: [name] Primary Care Leilynn Pilat: [name] Other Clinician: Referring Tashera Montalvo: [name] Treating Rivaldo Hineman/Extender: [name] in Treatment: 5 Vital Signs Height(in): 61 Pulse(bpm): 63 Weight(lbs): 175 Blood Pressure(mmHg): 149/88 Body Mass Index(BMI): 33 Temperature(F):  98.4 Respiratory Rate(breaths/min): 16 Photos: [1:No Photos] [N/A:N/A] Wound Location: [1:Left, Anterior Lower Leg] [N/A:N/A] Wounding Event: [1:Puncture] [N/A:N/A] Primary Etiology: [1:Skin Tear] [N/A:N/A] Comorbid History: [1:Cataracts, Asthma, Hypertension, Osteoarthritis, Received Radiation] [N/A:N/A] Date Acquired: [1:05/18/2020] [N/A:N/A] Weeks of Treatment: [1:5] [N/A:N/A] Wound Status: [1:Open] [N/A:N/A] Measurements L x W x D (cm) [1:0.9x0.7x0.2] [N/A:N/A] Area (cm) : [1:0.495] [N/A:N/A] Volume (cm) : [1:0.099] [N/A:N/A] % Reduction in Area: [1:67.70%] [N/A:N/A] % Reduction in Volume: [1:35.30%] [N/A:N/A] Classification: [1:Full Thickness Without Exposed Support Structures] [N/A:N/A] Exudate Amount: [1:Medium] [N/A:N/A] Exudate Type: [1:Serous] [N/A:N/A] Exudate Color: [1:amber] [  N/A:N/A] Wound Margin: [1:Thickened] [N/A:N/A] Granulation Amount: [1:Small (1-33%)] [N/A:N/A] Granulation Quality: [1:Pink, Pale] [N/A:N/A] Necrotic Amount: [1:Large (67-100%)] [N/A:N/A] Exposed Structures: [1:Fat Layer (Subcutaneous Tissue): Yes Fascia: No Tendon: No Muscle: No Joint: No Bone: No None] [N/A:N/A N/A] Treatment Notes Electronic Signature(s) Signed: 08/07/2020 2:10:41 PM By: [name], [name] Entered By: [name], Minus Breeding on 08/07/2020 11:54:46 Aubert, [name] ([id number]) -------------------------------------------------------------------------------- Multi-Disciplinary Care Plan Details Patient Name: [name] Date of Service: 08/07/2020 11:00 AM Medical Record Number: [id number] Patient Account Number: [id number] Date of Birth/Sex: 07/29/1945 (74 y.o. F) Treating RN: [name] Primary Care Hassell Patras: [name] Other Clinician: Referring Hermenegildo Clausen: [name] Treating Lashe Oliveira/Extender: [name] in Treatment: 5 Active Inactive Orientation to the Wound Care Program Nursing Diagnoses: Knowledge deficit related to the wound  healing center program Goals: Patient/caregiver will verbalize understanding of the [place] Program Date Initiated: 07/03/2020 Target Resolution Date: 07/31/2020 Goal Status: Active Interventions: Provide education on orientation to the wound center Notes: Wound/Skin Impairment Nursing Diagnoses: Impaired tissue integrity Goals: Ulcer/skin breakdown will have a volume reduction of 30% by week 4 Date Initiated: 07/03/2020 Target Resolution Date: 07/31/2020 Goal Status: Active Interventions: Assess ulceration(s) every visit Notes: Electronic Signature(s) Signed: 08/07/2020 2:10:41 PM By: [name], [name] Entered By: [name], Minus Breeding on 08/07/2020 11:56:57 Hussein, Mushka C. ([id number]) -------------------------------------------------------------------------------- Pain Assessment Details Patient Name: Cindy Loop, Gertrue C. Date of Service: 08/07/2020 11:00 AM Medical Record Number: [id number] Patient Account Number: [id number] Date of Birth/Sex: Dec 05, 1945 (74 y.o. F) Treating RN: [name] Primary Care Juelle Dickmann: [name] Other Clinician: Referring Julie-Anne Torain: [name] Treating Nishita Isaacks/Extender: [name] in Treatment: 5 Active Problems Location of Pain Severity and Description of Pain Patient Has Paino Yes Site Locations Rate the pain. Current Pain Level: 1 Pain Management and Medication Current Pain Management: Electronic Signature(s) Signed: 08/07/2020 2:10:41 PM By: [name], [name] Entered By: [name], Kenia on 08/07/2020 11:46:28 Golab, [name] ([id number]) -------------------------------------------------------------------------------- Patient/Caregiver Education Details Patient Name: [name] Date of Service: 08/07/2020 11:00 AM Medical Record Number: [id number] Patient Account Number: [id number] Date of Birth/Gender: [birth year] (74 y.o. F) Treating RN: [name] Primary Care  Physician: [name] Other Clinician: Referring Physician: [name] Treating Physician/Extender: [name] in Treatment: 5 Education Assessment Education Provided To: Patient Education Topics Provided Wound/Skin Impairment: Methods: Explain/Verbal Responses: State content correctly Electronic Signature(s) Signed: 08/07/2020 2:10:41 PM By: [name], [name] Entered By: [name], Kenia on 08/07/2020 11:56:21 Reger, Greydis C. ([id number]) -------------------------------------------------------------------------------- Wound Assessment Details Patient Name: Cindy Loop, Mateja C. Date of Service: 08/07/2020 11:00 AM Medical Record Number: [id number] Patient Account Number: [id number] Date of Birth/Sex: 08-Mar-1946 (74 y.o. F) Treating RN: [name] Primary Care Arnetha Silverthorne: [name] Other Clinician: Referring Othon Guardia: [name] Treating Morayma Godown/Extender: [name] in Treatment: 5 Wound Status Wound Number: 1 Primary Skin Tear Etiology: Wound Location: Left, Anterior Lower Leg Wound Status: Open Wounding Event: Puncture Comorbid Cataracts, Asthma, Hypertension, Osteoarthritis, Date Acquired: 05/18/2020 History: Received Radiation Weeks Of Treatment: 5 Clustered Wound: No Photos Photo Uploaded By: [name], Minus Breeding on 08/07/2020 16:14:39 Wound Measurements Length: (cm) 0.9 Width: (cm) 0.7 Depth: (cm) 0.2 Area: (cm) 0.495 Volume: (cm) 0.099 % Reduction in Area: 67.7% % Reduction in Volume: 35.3% Epithelialization: None Tunneling: No Undermining: No Wound Description Classification: Full Thickness Without Exposed Support Structu Wound Margin: Thickened Exudate Amount: Medium Exudate Type: Serous Exudate Color: amber res Foul Odor After Cleansing: No Slough/Fibrino Yes Wound Bed Granulation Amount: Small (1-33%) Exposed  Structure Granulation Quality: Pink, Pale Fascia Exposed: No Necrotic Amount: Large  (67-100%) Fat Layer (Subcutaneous Tissue) Exposed: Yes Necrotic Quality: Adherent Slough Tendon Exposed: No Muscle Exposed: No Joint Exposed: No Bone Exposed: No Treatment Notes Wound #1 (Lower Leg) Wound Laterality: Left, Anterior Cleanser Peri-Wound Care Topical [place], Rosio C. ([id number]) Primary Dressing Prisma 4.34 (in) Discharge Instruction: Moisten w/normal saline or sterile water; Cover wound as directed. Do not remove from wound bed. Secondary Dressing Gauze Discharge Instruction: As directed: dry, moistened with saline or moistened with Dakins Solution Secured With Curad Paper Tape, 1x10 (in/yd) Compression Wrap Compression Stockings Add-Ons Electronic Signature(s) Signed: 08/07/2020 2:10:41 PM By: [name], [name] Entered By: [name], Kenia on 08/07/2020 11:49:54 Rexroad, Oriana C. ([id number]) -------------------------------------------------------------------------------- [place] Details Patient Name: Cindy Loop, Kennadi C. Date of Service: 08/07/2020 11:00 AM Medical Record Number: [id number] Patient Account Number: [id number] Date of Birth/Sex: 1946-05-15 (74 y.o. F) Treating RN: Cornell Barman Primary Care Ivar Domangue: [name] Other Clinician: Referring Kyland No: [name] Treating Aariz Maish/Extender: [name] in Treatment: 5 Vital Signs Time Taken: 11:40 Temperature (F): 98.4 Height (in): 61 Pulse (bpm): 71 Weight (lbs): 175 Respiratory Rate (breaths/min): 16 Body Mass Index (BMI): 33.1 Blood Pressure (mmHg): 149/88 Reference Range: 80 - 120 mg / dl Electronic Signature(s) Signed: 08/07/2020 4:14:11 PM By: Lorine Bears RCP, RRT, CHT Entered By: Lorine Bears on 08/07/2020 11:41:21

## 2020-08-14 ENCOUNTER — Encounter (INDEPENDENT_AMBULATORY_CARE_PROVIDER_SITE_OTHER): Payer: Self-pay | Admitting: Vascular Surgery

## 2020-08-14 ENCOUNTER — Other Ambulatory Visit: Payer: Self-pay

## 2020-08-14 ENCOUNTER — Ambulatory Visit (INDEPENDENT_AMBULATORY_CARE_PROVIDER_SITE_OTHER): Payer: Medicare Other | Admitting: Vascular Surgery

## 2020-08-14 ENCOUNTER — Encounter: Payer: Medicare Other | Admitting: Physician Assistant

## 2020-08-14 VITALS — BP 164/99 | HR 77 | Ht 61.0 in | Wt 173.0 lb

## 2020-08-14 DIAGNOSIS — N1832 Chronic kidney disease, stage 3b: Secondary | ICD-10-CM | POA: Diagnosis not present

## 2020-08-14 DIAGNOSIS — L97221 Non-pressure chronic ulcer of left calf limited to breakdown of skin: Secondary | ICD-10-CM

## 2020-08-14 DIAGNOSIS — M7989 Other specified soft tissue disorders: Secondary | ICD-10-CM

## 2020-08-14 DIAGNOSIS — I83009 Varicose veins of unspecified lower extremity with ulcer of unspecified site: Secondary | ICD-10-CM | POA: Insufficient documentation

## 2020-08-14 DIAGNOSIS — L97822 Non-pressure chronic ulcer of other part of left lower leg with fat layer exposed: Secondary | ICD-10-CM | POA: Diagnosis not present

## 2020-08-14 DIAGNOSIS — I10 Essential (primary) hypertension: Secondary | ICD-10-CM | POA: Diagnosis not present

## 2020-08-14 DIAGNOSIS — J3089 Other allergic rhinitis: Secondary | ICD-10-CM | POA: Diagnosis not present

## 2020-08-14 DIAGNOSIS — M79605 Pain in left leg: Secondary | ICD-10-CM | POA: Diagnosis not present

## 2020-08-14 DIAGNOSIS — I872 Venous insufficiency (chronic) (peripheral): Secondary | ICD-10-CM | POA: Diagnosis not present

## 2020-08-14 DIAGNOSIS — S81832A Puncture wound without foreign body, left lower leg, initial encounter: Secondary | ICD-10-CM | POA: Diagnosis not present

## 2020-08-14 DIAGNOSIS — N183 Chronic kidney disease, stage 3 unspecified: Secondary | ICD-10-CM | POA: Insufficient documentation

## 2020-08-14 DIAGNOSIS — I83022 Varicose veins of left lower extremity with ulcer of calf: Secondary | ICD-10-CM

## 2020-08-14 DIAGNOSIS — L97909 Non-pressure chronic ulcer of unspecified part of unspecified lower leg with unspecified severity: Secondary | ICD-10-CM | POA: Insufficient documentation

## 2020-08-14 DIAGNOSIS — J301 Allergic rhinitis due to pollen: Secondary | ICD-10-CM | POA: Diagnosis not present

## 2020-08-14 DIAGNOSIS — M79604 Pain in right leg: Secondary | ICD-10-CM

## 2020-08-14 NOTE — Progress Notes (Addendum)
[name] ([id number]) Visit Report for 08/14/2020 Chief Complaint Document Details Patient Name: [name], [name]. Date of Service: 08/14/2020 2:00 PM Medical Record Number: [id number] Patient Account Number: [id number] Date of Birth/Sex: September 15, 1945 (75 y.o. F) Treating RN: Cornell Barman Primary Care Provider: [name] Other Clinician: Referring Provider: [name] Treating Provider/Extender: [name] in Treatment: 6 Information Obtained from: Patient Chief Complaint Left LE Ulcer Electronic Signature(s) Signed: 08/14/2020 2:31:46 PM By: Worthy Keeler PA-[name] Entered By: Worthy Keeler on 08/14/2020 14:31:46 Meulemans, Sira CMarland [name] ([id number]) -------------------------------------------------------------------------------- HPI Details Patient Name: Cindy Ohm [name]. Date of Service: 08/14/2020 2:00 PM Medical Record Number: [id number] Patient Account Number: [id number] Date of Birth/Sex: 06-07-1946 (75 y.o. F) Treating RN: Cornell Barman Primary Care Provider: [name] Other Clinician: Referring Provider: [name] Treating Provider/Extender: [name] in Treatment: 6 History of Present Illness HPI Description: 07/03/2020 upon evaluation today patient appears to be doing unfortunately somewhat poorly in regard to her leg where she struck this causing a wound 05/18/2020. She states that since that time she has had mainly an eschar covering sometimes it will drain sometimes not its been infected a couple times as well and she has been on several antibiotics including 5 days left of doxycycline that she is currently taking. No sharp debridement has been undertaken by anyone at this point. She tells me that she does have a history of leg swelling she is supposed to wear compression socks but she does not always do this. She also tells me that she does have hypertension but otherwise no major medical problems. She has not really been doing anything  compression wise since this was injured in October. 07/10/2020 on evaluation today patient actually appears to be making some progress here. Fortunately there is no signs of active infection systemically locally she still has some evidence of erythema infection although this is minimal compared to what it was even last week. Overall I am pleased in that regard. There does not appear to be any signs of active infection spreading outside aware of marked that is good news as well. In general the patient seems to be doing well although I think we already continue to probably treat her we likely need to switch her to Bactrim DS as the doxycycline is resistant that is what she was taking as of last week. 07/17/2020 upon evaluation today patient appears to be doing excellent in regard to her leg ulcer. In fact I think we can probably switch to collagen-based dressing at this point as she seems to be doing so well. There is no evidence of infection at this time and I think that she is making good progress. I do want her to continue with the oral antibiotics at this point. 07/31/2020 upon evaluation today patient appears to be doing quite well in regard to her lower extremity ulcer. She has been tolerating the dressing changes without complication. Fortunately there is no evidence of active infection at this time. I been very pleased with where things stand currently. No fevers, chills, nausea, vomiting, or diarrhea. 08/07/2020 on evaluation today patient appears to continue to make signs of good improvement with regard to her wound. I am very pleased with where things stand this is measuring smaller and overall I think she is doing excellent. 08/14/2020 upon evaluation today patient appears to be doing well in regard to her leg ulcer. She has been tolerating the dressing changes without complication. Fortunately there is no signs of active infection at  this time which is also great news. No fevers, chills,  nausea, vomiting, or diarrhea. Electronic Signature(s) Signed: 08/14/2020 3:29:36 PM By: Worthy Keeler PA-[name] Entered By: Worthy Keeler on 08/14/2020 15:29:36 [name], [name] ([id number]) -------------------------------------------------------------------------------- Physical Exam Details Patient Name: [name], Rosalyn [name]. Date of Service: 08/14/2020 2:00 PM Medical Record Number: [id number] Patient Account Number: [id number] Date of Birth/Sex: 1946-06-25 (75 y.o. F) Treating RN: Cornell Barman Primary Care Provider: [name] Other Clinician: Referring Provider: [name] Treating Provider/Extender: [name] in Treatment: 6 Constitutional Well-nourished and well-hydrated in no acute distress. Respiratory normal breathing without difficulty. Psychiatric this patient is able to make decisions and demonstrates good insight into disease process. Alert and Oriented x 3. pleasant and cooperative. Notes I am extremely pleased with the progress that the patient seems to be making. Overall I think that she can use the gentamicin cream she states that she feels like it may be a little tingle and she wonders about the possibility of infection. Again I do not see anything that looks overtly infected but I do not believe using the gentamicin is going to cause any complications whatsoever. Electronic Signature(s) Signed: 08/14/2020 3:29:58 PM By: Worthy Keeler PA-[name] Entered By: Worthy Keeler on 08/14/2020 15:29:58 [name] ([id number]) -------------------------------------------------------------------------------- Physician Orders Details Patient Name: Cindy Ohm [name]. Date of Service: 08/14/2020 2:00 PM Medical Record Number: [id number] Patient Account Number: [id number] Date of Birth/Sex: 1945-11-18 (75 y.o. F) Treating RN: [name] Primary Care Provider: [name] Other Clinician: Referring Provider: [name] Treating Provider/Extender:  [name] in Treatment: 6 Verbal / Phone Orders: No Diagnosis Coding ICD-10 Coding Code Description I87.2 Venous insufficiency (chronic) (peripheral) S81.802A Unspecified open wound, left lower leg, initial encounter L97.822 Non-pressure chronic ulcer of other part of left lower leg with fat layer exposed [place] (primary) hypertension Follow-up Appointments o Return Appointment in 1 week. Bathing/ Shower/ Hygiene o May shower; gently cleanse wound with antibacterial soap, rinse and pat dry prior to dressing wounds Edema Control - Lymphedema / Segmental Compressive Device / Other o Patient to wear own compression stockings. o Elevate, Exercise Daily and Avoid Standing for Long Periods of Time. Wound Treatment Wound #1 - Lower Leg Wound Laterality: Left, Anterior Topical: Gentamicin 3 x Per Week/30 Days Discharge Instructions: Apply as directed by provider. Primary Dressing: Prisma 4.34 (in) (Generic) 3 x Per Week/30 Days Discharge Instructions: Moisten w/normal saline or sterile water; Cover wound as directed. Do not remove from wound bed. Secondary Dressing: Non-Adherent Pad 3x8 (in/in) (Generic) 3 x Per Week/30 Days Secured With: Curad Paper Tape, 1x10 (in/yd) (Generic) 3 x Per Week/30 Days Electronic Signature(s) Signed: 08/14/2020 3:06:49 PM By: [name], [name] Signed: 08/14/2020 4:53:43 PM By: Worthy Keeler PA-[name] Entered By: [name], Minus Breeding on 08/14/2020 15:05:36 [name], [name]. ([id number]) -------------------------------------------------------------------------------- Problem List Details Patient Name: [name], [name]. Date of Service: 08/14/2020 2:00 PM Medical Record Number: [id number] Patient Account Number: [id number] Date of Birth/Sex: [birth year] (74 y.o. F) Treating RN: Cornell Barman Primary Care Provider: [name] Other Clinician: Referring Provider: [name] Treating Provider/Extender: [name] in  Treatment: 6 Active Problems ICD-10 Encounter Code Description Active Date MDM Diagnosis I87.2 Venous insufficiency (chronic) (peripheral) 07/03/2020 No Yes S81.802A Unspecified open wound, left lower leg, initial encounter 07/03/2020 No Yes L97.822 Non-pressure chronic ulcer of other part of left lower leg with fat layer 07/03/2020 No Yes exposed [place] (primary) hypertension 07/03/2020 No Yes Inactive Problems Resolved Problems Electronic  Signature(s) Signed: 08/14/2020 2:31:40 PM By: Worthy Keeler PA-[name] Entered By: Worthy Keeler on 08/14/2020 14:31:40 [name], [name] ([id number]) -------------------------------------------------------------------------------- Progress Note Details Patient Name: [name], [name]. Date of Service: 08/14/2020 2:00 PM Medical Record Number: [id number] Patient Account Number: [id number] Date of Birth/Sex: 14-Apr-1946 (74 y.o. F) Treating RN: Cornell Barman Primary Care Provider: [name] Other Clinician: Referring Provider: [name] Treating Provider/Extender: [name] in Treatment: 6 Subjective Chief Complaint Information obtained from Patient Left LE Ulcer History of Present Illness (HPI) 07/03/2020 upon evaluation today patient appears to be doing unfortunately somewhat poorly in regard to her leg where she struck this causing a wound 05/18/2020. She states that since that time she has had mainly an eschar covering sometimes it will drain sometimes not its been infected a couple times as well and she has been on several antibiotics including 5 days left of doxycycline that she is currently taking. No sharp debridement has been undertaken by anyone at this point. She tells me that she does have a history of leg swelling she is supposed to wear compression socks but she does not always do this. She also tells me that she does have hypertension but otherwise no major medical problems. She has not really been doing  anything compression wise since this was injured in October. 07/10/2020 on evaluation today patient actually appears to be making some progress here. Fortunately there is no signs of active infection systemically locally she still has some evidence of erythema infection although this is minimal compared to what it was even last week. Overall I am pleased in that regard. There does not appear to be any signs of active infection spreading outside aware of marked that is good news as well. In general the patient seems to be doing well although I think we already continue to probably treat her we likely need to switch her to Bactrim DS as the doxycycline is resistant that is what she was taking as of last week. 07/17/2020 upon evaluation today patient appears to be doing excellent in regard to her leg ulcer. In fact I think we can probably switch to collagen-based dressing at this point as she seems to be doing so well. There is no evidence of infection at this time and I think that she is making good progress. I do want her to continue with the oral antibiotics at this point. 07/31/2020 upon evaluation today patient appears to be doing quite well in regard to her lower extremity ulcer. She has been tolerating the dressing changes without complication. Fortunately there is no evidence of active infection at this time. I been very pleased with where things stand currently. No fevers, chills, nausea, vomiting, or diarrhea. 08/07/2020 on evaluation today patient appears to continue to make signs of good improvement with regard to her wound. I am very pleased with where things stand this is measuring smaller and overall I think she is doing excellent. 08/14/2020 upon evaluation today patient appears to be doing well in regard to her leg ulcer. She has been tolerating the dressing changes without complication. Fortunately there is no signs of active infection at this time which is also great news. No fevers,  chills, nausea, vomiting, or diarrhea. Objective Constitutional Well-nourished and well-hydrated in no acute distress. Vitals Time Taken: 2:21 PM, Height: 61 in, Weight: 175 lbs, BMI: 33.1, Temperature: 98.4 F, Pulse: 92 bpm, Respiratory Rate: 16 breaths/min, Blood Pressure: 159/79 mmHg. Respiratory normal breathing without difficulty. Psychiatric this patient is able  to make decisions and demonstrates good insight into disease process. Alert and Oriented x 3. pleasant and cooperative. General Notes: I am extremely pleased with the progress that the patient seems to be making. Overall I think that she can use the gentamicin cream she states that she feels like it may be a little tingle and she wonders about the possibility of infection. Again I do not see anything that looks overtly infected but I do not believe using the gentamicin is going to cause any complications whatsoever. Integumentary (Hair, Skin) Wound #1 status is Open. Original cause of wound was Puncture. The wound is located on the Left,Anterior Lower Leg. The wound measures [name], [name]. ([id number]) 0.9cm length x 0.7cm width x 0.2cm depth; 0.495cm^2 area and 0.099cm^3 volume. There is Fat Layer (Subcutaneous Tissue) exposed. There is no tunneling or undermining noted. There is a medium amount of serous drainage noted. The wound margin is thickened. There is small (1-33%) pink, pale granulation within the wound bed. There is a large (67-100%) amount of necrotic tissue within the wound bed including Adherent Slough. Assessment Active Problems ICD-10 Venous insufficiency (chronic) (peripheral) Unspecified open wound, left lower leg, initial encounter Non-pressure chronic ulcer of other part of left lower leg with fat layer exposed [place] (primary) hypertension Plan Follow-up Appointments: Return Appointment in 1 week. Bathing/ Shower/ Hygiene: May shower; gently cleanse wound with antibacterial soap, rinse and  pat dry prior to dressing wounds Edema Control - Lymphedema / Segmental Compressive Device / Other: Patient to wear own compression stockings. Elevate, Exercise Daily and Avoid Standing for Long Periods of Time. WOUND #1: - Lower Leg Wound Laterality: Left, Anterior Topical: Gentamicin 3 x Per Week/30 Days Discharge Instructions: Apply as directed by provider. Primary Dressing: Prisma 4.34 (in) (Generic) 3 x Per Week/30 Days Discharge Instructions: Moisten w/normal saline or sterile water; Cover wound as directed. Do not remove from wound bed. Secondary Dressing: Non-Adherent Pad 3x8 (in/in) (Generic) 3 x Per Week/30 Days Secured With: Curad Paper Tape, 1x10 (in/yd) (Generic) 3 x Per Week/30 Days 1. Would recommend currently that we going to continue with the wound care measures as before we can use a little bit of gentamicin followed by the silver collagen I think that is doing a good job for her. 2. I am also can recommend that we continue with the nonadherent pad followed by the tape to secure in place. 3. I also recommend that she continue to wear her compression stocking that seems to be doing a good job for her to be honest. We will see patient back for reevaluation in 1 week here in the clinic. If anything worsens or changes patient will contact our office for additional recommendations. Electronic Signature(s) Signed: 08/14/2020 3:30:32 PM By: Worthy Keeler PA-[name] Entered By: Worthy Keeler on 08/14/2020 15:30:32 [name], [name] ([id number]) -------------------------------------------------------------------------------- SuperBill Details Patient Name: Cindy Ohm [name]. Date of Service: 08/14/2020 Medical Record Number: [id number] Patient Account Number: [id number] Date of Birth/Sex: [birth year] (74 y.o. F) Treating RN: [name] Primary Care Provider: [name] Other Clinician: Referring Provider: [name] Treating Provider/Extender: [name] in  Treatment: 6 Diagnosis Coding ICD-10 Codes Code Description I87.2 Venous insufficiency (chronic) (peripheral) S81.802A Unspecified open wound, left lower leg, initial encounter L97.822 Non-pressure chronic ulcer of other part of left lower leg with fat layer exposed [place] (primary) hypertension Facility Procedures CPT4 Code: [id number] Description: 51884 - WOUND CARE VISIT-LEV 3 EST PT Modifier: Quantity: 1 Physician Procedures CPT4 Code: 1660630 Description:  99213 - WC PHYS LEVEL 3 - EST PT Modifier: Quantity: 1 CPT4 Code: Description: ICD-10 Diagnosis Description I87.2 Venous insufficiency (chronic) (peripheral) S81.802A Unspecified open wound, left lower leg, initial encounter L97.822 Non-pressure chronic ulcer of other part of left lower leg with fat lay I10 Essential  (primary) hypertension Modifier: er exposed Quantity: Electronic Signature(s) Signed: 08/14/2020 3:43:53 PM By: Worthy Keeler PA-[name] Previous Signature: 08/14/2020 3:06:49 PM Version By: [name], [name] Entered By: Worthy Keeler on 08/14/2020 15:43:53

## 2020-08-14 NOTE — Assessment & Plan Note (Signed)
It has been over 3 years since her venous status was checked.  She did have reflux at that time and I suspect she will again.  Venous reflux studies will be checked in the near future at her convenience and if she does have reflux as one would anticipate, a laser ablation of the left great saphenous vein would be beneficial for wound healing and avoidance of recurrence as well as pain and swelling.

## 2020-08-14 NOTE — Progress Notes (Signed)
Patient ID: [name], female   DOB: Jun 23, 1946, 75 y.o.   MRN: [id number]  Chief Complaint  Patient presents with   Follow-up    Est.pt masoud. BLE edema     HPI [name] is a 75 y.o. female.  I am asked to see the patient by Dr. Lavera Guise for evaluation of leg swelling and a nonhealing ulceration of the left anterior medial calf/shin area.  This happened from a trauma back in September.  She was getting okra from her garden and traumatized the area which has led to a longstanding wound.  She has been getting excellent local wound care from the wound care center, but the wound does persist.  It is improving.  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  We actually saw her in the office a little over 3 years ago and evaluated her for arterial and venous disease.  She did have venous reflux in both legs at that time but no significant arterial disease.  Given her nonhealing ulceration and persistent swelling, she is referred back for further evaluation and treatment.  No fevers or chills or signs of systemic infection.  No chest pain or shortness of breath.  The wound is mildly tender but her pain level is low right now and her swelling is actually some better as she has been wearing her compression stockings every day.     Past Medical History:  Diagnosis Date   Asthma    WELL CONTROLLED   Bulging lumbar disc    Cancer ([place]) 1999   thyroid with recurrent 3710   Complication of anesthesia    HEART STOPPED DURING TONSILLECTOMY (AGE 22) DUE TO ALLERGY TO ETHER   DDD (degenerative disc disease), lumbar    DDD (degenerative disc disease), thoracolumbar    Diverticulosis    Family history of adverse reaction to anesthesia    PTS FATHER-UNSURE WHAT HAPPENED   GERD (gastroesophageal reflux disease)    Heart murmur    Hepatitis    AGE 48 "VIRAL"   Hypertension    OFF MEDS CURRENTLY PER PCP ([facility])   Hypothyroidism    Interstitial  cystitis    Kidney disease    PONV (postoperative nausea and vomiting)    Thyroid disease     Past Surgical History:  Procedure Laterality Date   BLADDER SURGERY  1984   BREAST SURGERY  [id number]   mass removed   BREAST SURGERY  April 2017   Dr Jack Quarto [facility]   CATARACT EXTRACTION  2011   CHOLECYSTECTOMY N/A 11/30/2015   Procedure: LAPAROSCOPIC CHOLECYSTECTOMY WITH INTRAOPERATIVE CHOLANGIOGRAM;  Surgeon: Christene Lye, MD;  Location: [facility] ORS;  Service: General;  Laterality: N/A;   COLONOSCOPY  2005, 2015   DILATION AND CURETTAGE OF UTERUS  1975   EXCISIONAL [place] Left 2011   [place]  2015   THYROIDECTOMY  1999   TONSILLECTOMY  1952   AGE 22     Family History  Problem Relation Age of Onset   Heart disease Mother    Cancer Father        colon   Heart disease Brother    Diabetes Maternal Aunt    Asthma Paternal Aunt    Bladder Cancer Maternal Aunt    Ovarian cancer Neg Hx    Kidney cancer Neg Hx    Prostate cancer Neg Hx  Social History   Tobacco Use   Smoking status: Former Smoker    Packs/day: 1.00    Years: 15.00    Pack years: 15.00    Quit date: 11/25/1985    Years since quitting: 34.7   Smokeless tobacco: Never Used  Vaping Use   Vaping Use: Never used  Substance Use Topics   Alcohol use: No   Drug use: No    Allergies  Allergen Reactions   Pollen Extract Other (See Comments)   Ether     "HEART STOPPED WHILE IN SURGERY"   Gramineae Pollens Other (See Comments)   Penicillins Hives   Tamsulosin     Other reaction(s): Other (See Comments) "made me loopy"   Band-Aid Plus Antibiotic [Bacitracin-Polymyxin B] Rash    Current Outpatient Medications  Medication Sig Dispense Refill   albuterol (PROVENTIL) (2.5 MG/3ML) 0.083% nebulizer solution Inhale into the lungs.     ALBUTEROL IN Inhale into the lungs as needed.     aspirin 81 MG  tablet Take 81 mg by mouth daily.     Calcium 500 MG CHEW Chew by mouth.     cetirizine (ZYRTEC) 10 MG tablet Take 10 mg by mouth daily.     Cholecalciferol (VITAMIN D-3) 1000 units CAPS Take by mouth daily.     EPINEPHrine 0.3 mg/0.3 mL IJ SOAJ injection See admin instructions.     FLUoxetine (PROZAC) 40 MG capsule Take 40 mg by mouth every morning.     fluticasone (FLONASE) 50 MCG/ACT nasal spray Place 1 spray into both nostrils daily.     Fluticasone-Salmeterol (ADVAIR) 250-50 MCG/DOSE AEPB Inhale 1 puff into the lungs 3 (three) times daily as needed.      furosemide (LASIX) 20 MG tablet Take 40 mg by mouth daily.      HYDROcodone-acetaminophen (NORCO) 7.5-325 MG tablet Take by mouth.     levofloxacin (LEVAQUIN) 250 MG tablet Take 1 tablet (250 mg total) by mouth daily. 7 tablet 0   levothyroxine (SYNTHROID) 150 MCG tablet TAKE 1 TABLET BY MOUTH DAILY 90 tablet 3   olmesartan (BENICAR) 20 MG tablet Take 20 mg by mouth as needed (IF BP IS ELEVATED).     Omega-3 Fatty Acids (FISH OIL) 1000 MG CAPS Take by mouth.     pantoprazole (PROTONIX) 40 MG tablet Take 1 tablet by mouth every morning.      potassium chloride SA (KLOR-CON) 20 MEQ tablet TAKE ONE TABLET BY MOUTH EVERY DAY 90 tablet 1   predniSONE (DELTASONE) 2.5 MG tablet TAKE ONE TABLET EVERY DAY 30 tablet 6   pregabalin (LYRICA) 50 MG capsule Take by mouth.     PRESCRIPTION MEDICATION Allergy injections once weekly     tiZANidine (ZANAFLEX) 4 MG capsule Take 4 mg by mouth as needed.      vitamin C (ASCORBIC ACID) 500 MG tablet Take by mouth.     zolpidem (AMBIEN) 10 MG tablet Take 1 tablet (10 mg total) by mouth at bedtime. 90 tablet 1   cannabidiol (EPIDIOLEX) 100 MG/ML solution Take by mouth.     doxycycline (VIBRA-TABS) 100 MG tablet Take 1 tablet (100 mg total) by mouth 2 (two) times daily. (Patient not taking: Reported on 08/14/2020) 20 tablet 0   fluconazole (DIFLUCAN) 100 MG tablet TAKE 1 TABLET BY MOUTH  DAILY FOR 4 DAYS (Patient not taking: Reported on 08/14/2020) 4 tablet 0   gabapentin (NEURONTIN) 100 MG capsule TAKE 2 CAPSULES BY MOUTH TWICE DAILY (Patient not taking: Reported on 08/14/2020)  hydrOXYzine (ATARAX/VISTARIL) 25 MG tablet 1 tablet     raloxifene (EVISTA) 60 MG tablet  (Patient not taking: Reported on 08/14/2020)  10   No current facility-administered medications for this visit.      REVIEW OF SYSTEMS (Negative unless checked)  Constitutional: [] ?Weight loss  [] ?Fever  [] ?Chills Cardiac: [] ?Chest pain   [] ?Chest pressure   [] ?Palpitations   [] ?Shortness of breath when laying flat   [] ?Shortness of breath at rest   [] ?Shortness of breath with exertion. Vascular:  [] ?Pain in legs with walking   [] ?Pain in legs at rest   [] ?Pain in legs when laying flat   [] ?Claudication   [] ?Pain in feet when walking  [] ?Pain in feet at rest  [] ?Pain in feet when laying flat   [] ?History of DVT   [] ?Phlebitis   [x] ?Swelling in legs   [x] ?Varicose veins   [] ?Non-healing ulcers Pulmonary:   [] ?Uses home oxygen   [] ?Productive cough   [] ?Hemoptysis   [] ?Wheeze  [] ?COPD   [] ?Asthma Neurologic:  [x] ?Dizziness  [] ?Blackouts   [] ?Seizures   [] ?History of stroke   [] ?History of TIA  [] ?Aphasia   [] ?Temporary blindness   [] ?Dysphagia   [] ?Weakness or numbness in arms   [] ?Weakness or numbness in legs Musculoskeletal:  [x] ?Arthritis   [] ?Joint swelling   [] ?Joint pain   [] ?Low back pain Hematologic:  [] ?Easy bruising  [] ?Easy bleeding   [] ?Hypercoagulable state   [] ?Anemic  [] ?Hepatitis Gastrointestinal:  [] ?Blood in stool   [] ?Vomiting blood  [x] ?Gastroesophageal reflux/heartburn   [] ?Difficulty swallowing. Genitourinary:  [x] ?Chronic kidney disease   [] ?Difficult urination  [] ?Frequent urination  [] ?Burning with urination   [] ?Blood in urine Skin:  [] ?Rashes   [] ?Ulcers   [] ?Wounds Psychological:  [] ?History of anxiety   [] ? History of major depression.    Physical Exam BP (!) 164/99    Pulse 77     Ht 5\' 1"  (1.549 m)    Wt 173 lb (78.5 kg)    BMI 32.69 kg/m  Gen:  WD/WN, NAD.  Appears younger than stated age Head: [place]/AT, No temporalis wasting.  Ear/Nose/Throat: Hearing grossly intact, nares w/o erythema or drainage, oropharynx w/o Erythema/Exudate Eyes: Conjunctiva clear, sclera non-icteric  Neck: trachea midline.  No JVD.  Pulmonary:  Good air movement, respirations not labored, no use of accessory muscles  Cardiac: RRR, no JVD Vascular:  Vessel Right Left  Radial Palpable Palpable                          DP 2+ 2+  PT 2+ 1+   Gastrointestinal:. No masses, surgical incisions, or scars. Musculoskeletal: M/S 5/5 throughout.  Extremities without ischemic changes.  No deformity or atrophy.  Small circular ulceration less than 1 cm on the anterior medial left calf/shin area.  No surrounding erythema.  Some serous drainage is present.  Trace right lower extremity edema, 1+ left lower extremity edema. Neurologic: Sensation grossly intact in extremities.  Symmetrical.  Speech is fluent. Motor exam as listed above. Psychiatric: Judgment intact, Mood & affect appropriate for pt's clinical situation. Dermatologic: Left calf wound as described above    Radiology No results found.  Labs Recent Results (from the past 2160 hour(s))  CBC with Differential/Platelet     Status: None   Collection Time: 06/09/20 10:34 AM  Result Value Ref Range   WBC 6.6 3.8 - 10.8 Thousand/uL   RBC 4.13 3.80 - 5.10 Million/uL   Hemoglobin 12.3 11.7 - 15.5 g/dL   HCT 36.7  35.0 - 45.0 %   MCV 88.9 80.0 - 100.0 fL   MCH 29.8 27.0 - 33.0 pg   MCHC 33.5 32.0 - 36.0 g/dL   RDW 13.1 11.0 - 15.0 %   Platelets 319 140 - 400 Thousand/uL   MPV 10.2 7.5 - 12.5 fL   Neutro Abs 4,257 1,500 - 7,800 cells/uL   Lymphs Abs 1,313 850 - 3,900 cells/uL   Absolute Monocytes 680 200 - 950 cells/uL   Eosinophils Absolute 231 15 - 500 cells/uL   Basophils Absolute 119 0 - 200 cells/uL   Neutrophils Relative % 64.5 %    Total Lymphocyte 19.9 %   Monocytes Relative 10.3 %   Eosinophils Relative 3.5 %   Basophils Relative 1.8 %  COMPLETE METABOLIC PANEL WITH GFR     Status: Abnormal   Collection Time: 06/09/20 10:34 AM  Result Value Ref Range   Glucose, Bld 84 65 - 99 mg/dL    Comment: .            Fasting reference interval .    BUN 22 7 - 25 mg/dL   Creat 1.51 (H) 0.60 - 0.93 mg/dL    Comment: For patients >60 years of age, the reference limit for Creatinine is approximately 13% higher for people identified as African-American. .    GFR, Est Non African American 33 (L) > OR = 60 mL/min/1.15m2   GFR, Est African American 39 (L) > OR = 60 mL/min/1.38m2   BUN/Creatinine Ratio 15 6 - 22 (calc)   Sodium 139 135 - 146 mmol/L   Potassium 4.7 3.5 - 5.3 mmol/L   Chloride 104 98 - 110 mmol/L   CO2 23 20 - 32 mmol/L   Calcium 10.8 (H) 8.6 - 10.4 mg/dL   Total Protein 6.3 6.1 - 8.1 g/dL   Albumin 4.4 3.6 - 5.1 g/dL   Globulin 1.9 1.9 - 3.7 g/dL (calc)   AG Ratio 2.3 1.0 - 2.5 (calc)   Total Bilirubin 0.5 0.2 - 1.2 mg/dL   Alkaline phosphatase (APISO) 62 37 - 153 U/L   AST 19 10 - 35 U/L   ALT 19 6 - 29 U/L  Lipid panel     Status: None   Collection Time: 06/09/20 10:34 AM  Result Value Ref Range   Cholesterol 193 <200 mg/dL   HDL 89 > OR = 50 mg/dL   Triglycerides 90 <150 mg/dL   LDL Cholesterol (Calc) 86 mg/dL (calc)    Comment: Reference range: <100 . Desirable range <100 mg/dL for primary prevention;   <70 mg/dL for patients with CHD or diabetic patients  with > or = 2 CHD risk factors. Marland Kitchen LDL-C is now calculated using the Martin-Hopkins  calculation, which is a validated novel method providing  better accuracy than the Friedewald equation in the  estimation of LDL-C.  Cresenciano Genre et al. Annamaria Helling. 9892;119(41): 2061-2068  (http://education.[facility].com/faq/FAQ164)    Total CHOL/HDL Ratio 2.2 <5.0 (calc)   Non-HDL Cholesterol (Calc) 104 <130 mg/dL (calc)    Comment: For patients  with diabetes plus 1 major ASCVD risk  factor, treating to a non-HDL-C goal of <100 mg/dL  (LDL-C of <70 mg/dL) is considered a therapeutic  option.   TSH     Status: None   Collection Time: 06/09/20 10:34 AM  Result Value Ref Range   TSH 1.37 0.40 - 4.50 mIU/L  Aerobic Culture (superficial specimen)     Status: None   Collection Time: 07/06/20 11:30 AM  Specimen: Wound  Result Value Ref Range   Specimen Description      WOUND Performed at [facility], [address] [place], [place] [address]    Special Requests      LEFT LEG Performed at [facility], [place], [place] [address]    Gram Stain      RARE WBC PRESENT, PREDOMINANTLY MONONUCLEAR FEW GRAM POSITIVE COCCI Performed at [facility], [place] [address], [place], [place] [address]    Culture MODERATE STAPHYLOCOCCUS AUREUS    Report Status 07/08/2020 FINAL    Organism ID, Bacteria STAPHYLOCOCCUS AUREUS       Susceptibility   Staphylococcus aureus - MIC*    CIPROFLOXACIN >=8 RESISTANT Resistant     ERYTHROMYCIN >=8 RESISTANT Resistant     GENTAMICIN <=0.5 SENSITIVE Sensitive     OXACILLIN <=0.25 SENSITIVE Sensitive     TETRACYCLINE >=16 RESISTANT Resistant     VANCOMYCIN 1 SENSITIVE Sensitive     TRIMETH/SULFA <=10 SENSITIVE Sensitive     CLINDAMYCIN >=8 RESISTANT Resistant     RIFAMPIN <=0.5 SENSITIVE Sensitive     Inducible Clindamycin NEGATIVE Sensitive     * MODERATE STAPHYLOCOCCUS AUREUS    Assessment/Plan: Hypertension blood pressure control important in reducing the progression of atherosclerotic disease.   Stage 3 chronic kidney disease ([place]) Could certainly contribute to LE edema  Lower extremity pain, bilateral Likely a combination of venous disease and neuropathy.    We have not checked her arterial or venous status in about 3 years, so this will be done in the near future at her convenience.  Lower limb ulcer, calf, left, limited to breakdown of skin  ([place]) Has been getting excellent local wound care at the wound care center.  At this point, it has been over 3 years since her vascular status has been checked.  We will obtain arterial and venous studies in the near future at her convenience.  She had significant venous disease 3 years ago, so I suspect that venous intervention may help with wound healing and avoidance of recurrence of the wound.  Swelling of limb This has been under pretty good control with continuous compression stockings and leg elevation.  She is also retired so she is on her feet a little less.  Venous studies to be done in the near future at her convenience.  Continue appropriate conservative therapies.  Varicose veins of lower extremities with ulcer ([place]) It has been over 3 years since her venous status was checked.  She did have reflux at that time and I suspect she will again.  Venous reflux studies will be checked in the near future at her convenience and if she does have reflux as one would anticipate, a laser ablation of the left great saphenous vein would be beneficial for wound healing and avoidance of recurrence as well as pain and swelling.      Leotis Pain 08/14/2020, 11:55 AM   This note was created with Dragon medical transcription system.  Any errors from dictation are unintentional.

## 2020-08-14 NOTE — Assessment & Plan Note (Signed)
This has been under pretty good control with continuous compression stockings and leg elevation.  She is also retired so she is on her feet a little less.  Venous studies to be done in the near future at her convenience.  Continue appropriate conservative therapies.

## 2020-08-14 NOTE — Assessment & Plan Note (Signed)
Has been getting excellent local wound care at the wound care center.  At this point, it has been over 3 years since her vascular status has been checked.  We will obtain arterial and venous studies in the near future at her convenience.  She had significant venous disease 3 years ago, so I suspect that venous intervention may help with wound healing and avoidance of recurrence of the wound.

## 2020-08-20 NOTE — Progress Notes (Signed)
KI, LUCKMAN ([id number]) Visit Report for 08/14/2020 Arrival Information Details Patient Name: [name], [name]. Date of Service: 08/14/2020 2:00 PM Medical Record Number: [id number] Patient Account Number: [id number] Date of Birth/Sex: August 25, 1945 (75 y.o. F) Treating RN: Carlene Coria Primary Care Ngozi Alvidrez: [name] Other Clinician: Referring Arah Aro: [name] Treating Zephaniah Enyeart/Extender: [name] in Treatment: 6 Visit Information History Since Last Visit Pain Present Now: Yes Patient Arrived: Ambulatory Arrival Time: 14:21 Accompanied By: self Transfer Assistance: None Patient Identification Verified: Yes Secondary Verification Process Completed: Yes Electronic Signature(s) Signed: 08/20/2020 11:33:31 AM By: Carlene Coria RN Entered By: Carlene Coria on 08/14/2020 14:21:26 [name], [name] Kitchen ([id number]) -------------------------------------------------------------------------------- Clinic Level of Care Assessment Details Patient Name: [name], [name]. Date of Service: 08/14/2020 2:00 PM Medical Record Number: [id number] Patient Account Number: [id number] Date of Birth/Sex: 06-01-1946 (75 y.o. F) Treating RN: [name] Primary Care Beatrice Ziehm: [name] Other Clinician: Referring Elvira Langston: [name] Treating Faizah Kandler/Extender: [name] in Treatment: 6 Clinic Level of Care Assessment Items TOOL 4 Quantity Score X - Use when only an EandM is performed on FOLLOW-UP visit 1 0 ASSESSMENTS - Nursing Assessment / Reassessment X - Reassessment of Co-morbidities (includes updates in patient status) 1 10 X- 1 5 Reassessment of Adherence to Treatment Plan ASSESSMENTS - Wound and Skin Assessment / Reassessment X - Simple Wound Assessment / Reassessment - one wound 1 5 []  - 0 Complex Wound Assessment / Reassessment - multiple wounds []  - 0 Dermatologic / Skin Assessment (not related to wound area) ASSESSMENTS - Focused Assessment []  -  Circumferential Edema Measurements - multi extremities 0 []  - 0 Nutritional Assessment / Counseling / Intervention []  - 0 Lower Extremity Assessment (monofilament, tuning fork, pulses) []  - 0 Peripheral Arterial Disease Assessment (using hand held doppler) ASSESSMENTS - Ostomy and/or Continence Assessment and Care []  - Incontinence Assessment and Management 0 []  - 0 Ostomy Care Assessment and Management (repouching, etc.) PROCESS - Coordination of Care X - Simple Patient / Family Education for ongoing care 1 15 []  - 0 Complex (extensive) Patient / Family Education for ongoing care []  - 0 Staff obtains Programmer, systems, Records, Test Results / Process Orders []  - 0 Staff telephones HHA, Nursing Homes / Clarify orders / etc []  - 0 Routine Transfer to another Facility (non-emergent condition) []  - 0 Routine Hospital Admission (non-emergent condition) []  - 0 New Admissions / Biomedical engineer / Ordering NPWT, Apligraf, etc. []  - 0 Emergency Hospital Admission (emergent condition) X- 1 10 Simple Discharge Coordination []  - 0 Complex (extensive) Discharge Coordination PROCESS - Special Needs []  - Pediatric / Minor Patient Management 0 []  - 0 Isolation Patient Management []  - 0 Hearing / Language / Visual special needs []  - 0 Assessment of Community assistance (transportation, D/[name] planning, etc.) []  - 0 Additional assistance / Altered mentation []  - 0 Support Surface(s) Assessment (bed, cushion, seat, etc.) INTERVENTIONS - Wound Cleansing / Measurement Narula, Asal [name] X- 1 5 Simple Wound Cleansing - one wound []  - 0 Complex Wound Cleansing - multiple wounds X- 1 5 Wound Imaging (photographs - any number of wounds) []  - 0 Wound Tracing (instead of photographs) X- 1 5 Simple Wound Measurement - one wound []  - 0 Complex Wound Measurement - multiple wounds INTERVENTIONS - Wound Dressings []  - Small Wound Dressing one or multiple wounds 0 X- 1 15 Medium  Wound Dressing one or multiple wounds []  - 0 Large Wound Dressing one or multiple wounds []  - 0 Application of Medications -  topical []  - 0 Application of Medications - injection INTERVENTIONS - Miscellaneous []  - External ear exam 0 []  - 0 Specimen Collection (cultures, biopsies, blood, body fluids, etc.) []  - 0 Specimen(s) / Culture(s) sent or taken to Lab for analysis []  - 0 Patient Transfer (multiple staff / Harrel Lemon Lift / Similar devices) []  - 0 Simple Staple / Suture removal (25 or less) []  - 0 Complex Staple / Suture removal (26 or more) []  - 0 Hypo / Hyperglycemic Management (close monitor of Blood Glucose) []  - 0 Ankle / Brachial Index (ABI) - do not check if billed separately X- 1 5 Vital Signs Has the patient been seen at the hospital within the last three years: Yes Total Score: 80 Level Of Care: New/Established - Level 3 Electronic Signature(s) Signed: 08/14/2020 3:06:49 PM By: [name], [name] Entered By: [name], Kenia on 08/14/2020 14:46:27 [name], [name] Kitchen ([id number]) -------------------------------------------------------------------------------- Encounter Discharge Information Details Patient Name: [name], [name]. Date of Service: 08/14/2020 2:00 PM Medical Record Number: [id number] Patient Account Number: [id number] Date of Birth/Sex: 03-03-1946 (75 y.o. F) Treating RN: [name] Primary Care Reg Bircher: [name] Other Clinician: Referring Torrell Krutz: [name] Treating Tasharra Nodine/Extender: [name] in Treatment: 6 Encounter Discharge Information Items Discharge Condition: Stable Ambulatory Status: Ambulatory Discharge Destination: Home Transportation: Private Auto Accompanied By: self Schedule Follow-up Appointment: Yes Clinical Summary of Care: Electronic Signature(s) Signed: 08/14/2020 3:06:49 PM By: [name], [name] Entered By: [name], Minus Breeding on 08/14/2020 14:47:25 [name], [name] Kitchen  ([id number]) -------------------------------------------------------------------------------- Lower Extremity Assessment Details Patient Name: [name], [name]. Date of Service: 08/14/2020 2:00 PM Medical Record Number: [id number] Patient Account Number: [id number] Date of Birth/Sex: Jun 30, 1946 (74 y.o. F) Treating RN: Carlene Coria Primary Care Thurza Kwiecinski: [name] Other Clinician: Referring Mahrukh Seguin: [name] Treating Judithann Villamar/Extender: [name] in Treatment: 6 Edema Assessment Assessed: [Left: Yes] [Right: No] [Left: Edema] [Right: :] Calf Left: Right: Point of Measurement: 32 cm From Medial Instep 36.4 cm Ankle Left: Right: Point of Measurement: 10 cm From Medial Instep 22.5 cm Vascular Assessment Pulses: Dorsalis Pedis Palpable: [Left:Yes] Electronic Signature(s) Signed: 08/20/2020 11:33:31 AM By: Carlene Coria RN Entered By: Carlene Coria on 08/14/2020 14:31:12 Saulsbury, Tranesha [name]. ([id number]) -------------------------------------------------------------------------------- Multi Wound Chart Details Patient Name: [name], [name]. Date of Service: 08/14/2020 2:00 PM Medical Record Number: [id number] Patient Account Number: [id number] Date of Birth/Sex: 1945-08-02 (74 y.o. F) Treating RN: [name] Primary Care Alayja Armas: [name] Other Clinician: Referring Jacque Garrels: [name] Treating Telsa Dillavou/Extender: [name] in Treatment: 6 Vital Signs Height(in): 61 Pulse(bpm): 57 Weight(lbs): 175 Blood Pressure(mmHg): 159/79 Body Mass Index(BMI): 33 Temperature(F): 98.4 Respiratory Rate(breaths/min): 16 Photos: [N/A:N/A] Wound Location: Left, Anterior Lower Leg N/A N/A Wounding Event: Puncture N/A N/A Primary Etiology: Skin Tear N/A N/A Comorbid History: Cataracts, Asthma, Hypertension, N/A N/A Osteoarthritis, Received Radiation Date Acquired: 05/18/2020 N/A N/A Weeks of Treatment: 6 N/A N/A Wound Status: Open N/A N/A Measurements  L x W x D (cm) 0.9x0.7x0.2 N/A N/A Area (cm) : 0.495 N/A N/A Volume (cm) : 0.099 N/A N/A % Reduction in Area: 67.70% N/A N/A % Reduction in Volume: 35.30% N/A N/A Classification: Full Thickness Without Exposed N/A N/A Support Structures Exudate Amount: Medium N/A N/A Exudate Type: Serous N/A N/A Exudate Color: amber N/A N/A Wound Margin: Thickened N/A N/A Granulation Amount: Small (1-33%) N/A N/A Granulation Quality: Pink, Pale N/A N/A Necrotic Amount: Large (67-100%) N/A N/A Exposed Structures: Fat Layer (Subcutaneous Tissue): N/A N/A Yes Fascia: No Tendon: No Muscle: No  Joint: No Bone: No Epithelialization: None N/A N/A Treatment Notes Electronic Signature(s) Signed: 08/14/2020 3:06:49 PM By: [name], [name] Entered By: [name], Kenia on 08/14/2020 14:34:53 [place], [name] ([id number]) -------------------------------------------------------------------------------- Multi-Disciplinary Care Plan Details Patient Name: [name], [name]. Date of Service: 08/14/2020 2:00 PM Medical Record Number: [id number] Patient Account Number: [id number] Date of Birth/Sex: 03-30-1946 (74 y.o. F) Treating RN: [name] Primary Care Geovanie Winnett: [name] Other Clinician: Referring Latravious Levitt: [name] Treating Dakota Stangl/Extender: [name] in Treatment: 6 Active Inactive Wound/Skin Impairment Nursing Diagnoses: Impaired tissue integrity Goals: Ulcer/skin breakdown will have a volume reduction of 30% by week 4 Date Initiated: 07/03/2020 Target Resolution Date: 07/31/2020 Goal Status: Active Interventions: Assess ulceration(s) every visit Notes: Electronic Signature(s) Signed: 08/14/2020 3:06:49 PM By: [name], [name] Entered By: [name], Minus Breeding on 08/14/2020 14:34:44 Lavin, Daphane [name]. ([id number]) -------------------------------------------------------------------------------- Pain Assessment Details Patient Name: [name],  [name]. Date of Service: 08/14/2020 2:00 PM Medical Record Number: [id number] Patient Account Number: [id number] Date of Birth/Sex: January 22, 1946 (74 y.o. F) Treating RN: Carlene Coria Primary Care Kamren Heskett: [name] Other Clinician: Referring Coe Angelos: [name] Treating Ardith Test/Extender: [name] in Treatment: 6 Active Problems Location of Pain Severity and Description of Pain Patient Has Paino Yes Site Locations Pain Location: Pain in Ulcers Duration of the Pain. Constant / Intermittento Intermittent Rate the pain. Current Pain Level: 3 Character of Pain Describe the Pain: Burning Pain Management and Medication Current Pain Management: Electronic Signature(s) Signed: 08/20/2020 11:33:31 AM By: Carlene Coria RN Entered By: Carlene Coria on 08/14/2020 14:23:06 Batley, [name] Kitchen ([id number]) -------------------------------------------------------------------------------- Patient/Caregiver Education Details Patient Name: [name]. Date of Service: 08/14/2020 2:00 PM Medical Record Number: [id number] Patient Account Number: [id number] Date of Birth/Gender: 14-Mar-1946 (74 y.o. F) Treating RN: [name] Primary Care Physician: [name] Other Clinician: Referring Physician: [name] Treating Physician/Extender: [name] in Treatment: 6 Education Assessment Education Provided To: Patient Education Topics Provided Wound/Skin Impairment: Methods: Explain/Verbal Responses: State content correctly Electronic Signature(s) Signed: 08/14/2020 3:06:49 PM By: [name], [name] Entered By: [name], Minus Breeding on 08/14/2020 14:46:53 Corrigan, [name] Kitchen ([id number]) -------------------------------------------------------------------------------- Wound Assessment Details Patient Name: [name], [name]. Date of Service: 08/14/2020 2:00 PM Medical Record Number: [id number] Patient Account Number: [id number] Date of Birth/Sex:  1945/09/15 (74 y.o. F) Treating RN: Carlene Coria Primary Care Elie Gragert: [name] Other Clinician: Referring Madyson Lukach: [name] Treating Sueanne Maniaci/Extender: [name] in Treatment: 6 Wound Status Wound Number: 1 Primary Skin Tear Etiology: Wound Location: Left, Anterior Lower Leg Wound Status: Open Wounding Event: Puncture Comorbid Cataracts, Asthma, Hypertension, Osteoarthritis, Date Acquired: 05/18/2020 History: Received Radiation Weeks Of Treatment: 6 Clustered Wound: No Photos Wound Measurements Length: (cm) 0.9 Width: (cm) 0.7 Depth: (cm) 0.2 Area: (cm) 0.495 Volume: (cm) 0.099 % Reduction in Area: 67.7% % Reduction in Volume: 35.3% Epithelialization: None Tunneling: No Undermining: No Wound Description Classification: Full Thickness Without Exposed Support Structu Wound Margin: Thickened Exudate Amount: Medium Exudate Type: Serous Exudate Color: amber res Foul Odor After Cleansing: No Slough/Fibrino Yes Wound Bed Granulation Amount: Small (1-33%) Exposed Structure Granulation Quality: Pink, Pale Fascia Exposed: No Necrotic Amount: Large (67-100%) Fat Layer (Subcutaneous Tissue) Exposed: Yes Necrotic Quality: Adherent Slough Tendon Exposed: No Muscle Exposed: No Joint Exposed: No Bone Exposed: No Treatment Notes Wound #1 (Lower Leg) Wound Laterality: Left, Anterior Cleanser Peri-Wound Care Topical [name], [name]. ([id number]) Primary Dressing Prisma 4.34 (in) Discharge Instruction: Moisten w/normal saline or sterile water; Cover wound as directed. Do not  remove from wound bed. Secondary Dressing Gauze Discharge Instruction: As directed: dry, moistened with saline or moistened with Dakins Solution Secured With Curad Paper Tape, 1x10 (in/yd) Compression Wrap Compression Stockings Add-Ons Electronic Signature(s) Signed: 08/20/2020 11:33:31 AM By: Carlene Coria RN Entered By: Carlene Coria on 08/14/2020 14:29:49 Grosshans, Cindy  [name]. ([id number]) -------------------------------------------------------------------------------- Vitals Details Patient Name: [name], [name]. Date of Service: 08/14/2020 2:00 PM Medical Record Number: [id number] Patient Account Number: [id number] Date of Birth/Sex: 1946-02-03 (74 y.o. F) Treating RN: Carlene Coria Primary Care Shara Hartis: [name] Other Clinician: Referring Shatyra Becka: [name] Treating Ramon Brant/Extender: [name] in Treatment: 6 Vital Signs Time Taken: 14:21 Temperature (F): 98.4 Height (in): 61 Pulse (bpm): 92 Weight (lbs): 175 Respiratory Rate (breaths/min): 16 Body Mass Index (BMI): 33.1 Blood Pressure (mmHg): 159/79 Reference Range: 80 - 120 mg / dl Electronic Signature(s) Signed: 08/20/2020 11:33:31 AM By: Carlene Coria RN Entered By: Carlene Coria on 08/14/2020 14:22:40

## 2020-08-21 ENCOUNTER — Encounter: Payer: Medicare Other | Admitting: Physician Assistant

## 2020-08-21 ENCOUNTER — Other Ambulatory Visit: Payer: Self-pay

## 2020-08-21 DIAGNOSIS — S81832A Puncture wound without foreign body, left lower leg, initial encounter: Secondary | ICD-10-CM | POA: Diagnosis not present

## 2020-08-21 DIAGNOSIS — J3081 Allergic rhinitis due to animal (cat) (dog) hair and dander: Secondary | ICD-10-CM | POA: Diagnosis not present

## 2020-08-21 DIAGNOSIS — L97822 Non-pressure chronic ulcer of other part of left lower leg with fat layer exposed: Secondary | ICD-10-CM | POA: Diagnosis not present

## 2020-08-21 DIAGNOSIS — J3089 Other allergic rhinitis: Secondary | ICD-10-CM | POA: Diagnosis not present

## 2020-08-21 DIAGNOSIS — I872 Venous insufficiency (chronic) (peripheral): Secondary | ICD-10-CM | POA: Diagnosis not present

## 2020-08-21 DIAGNOSIS — J301 Allergic rhinitis due to pollen: Secondary | ICD-10-CM | POA: Diagnosis not present

## 2020-08-21 NOTE — Progress Notes (Signed)
Cindy, FERRAIOLO ([id number]) Visit Report for 08/21/2020 Arrival Information Details Patient Name: [name], [name]. Date of Service: 08/21/2020 1:30 PM Medical Record Number: [id number] Patient Account Number: [id number] Date of Birth/Sex: 06/19/1946 (74 y.o. F) Treating RN: Cornell Barman Primary Care Minette Manders: [name] Other Clinician: Referring Imer Foxworth: [name] Treating Mathis Cashman/Extender: [name] in Treatment: 7 Visit Information History Since Last Visit Added or deleted any medications: No Patient Arrived: Ambulatory Any new allergies or adverse reactions: No Arrival Time: 13:33 Had a fall or experienced change in No Accompanied By: self activities of daily living that may affect Transfer Assistance: None risk of falls: Patient Identification Verified: Yes Signs or symptoms of abuse/neglect since last visito No Secondary Verification Process Completed: Yes Hospitalized since last visit: No Implantable device outside of the clinic excluding No cellular tissue based products placed in the center since last visit: Has Dressing in Place as Prescribed: Yes Pain Present Now: No Electronic Signature(s) Signed: 08/21/2020 4:39:20 PM By: [name], [name] Entered By: [name], Kenia on 08/21/2020 13:37:42 [name] ([id number]) -------------------------------------------------------------------------------- Clinic Level of Care Assessment Details Patient Name: [name], [name] Date of Service: 08/21/2020 1:30 PM Medical Record Number: [id number] Patient Account Number: [id number] Date of Birth/Sex: [birth year] (74 y.o. F) Treating RN: [name] Primary Care Samba Cumba: [name] Other Clinician: Referring Aaditya Letizia: [name] Treating Mishika Flippen/Extender: [name] in Treatment: 7 Clinic Level of Care Assessment Items TOOL 4 Quantity Score X - Use when only an EandM is performed on FOLLOW-UP visit 1  0 ASSESSMENTS - Nursing Assessment / Reassessment X - Reassessment of Co-morbidities (includes updates in patient status) 1 10 X- 1 5 Reassessment of Adherence to Treatment Plan ASSESSMENTS - Wound and Skin Assessment / Reassessment X - Simple Wound Assessment / Reassessment - one wound 1 5 []  - 0 Complex Wound Assessment / Reassessment - multiple wounds []  - 0 Dermatologic / Skin Assessment (not related to wound area) ASSESSMENTS - Focused Assessment []  - Circumferential Edema Measurements - multi extremities 0 []  - 0 Nutritional Assessment / Counseling / Intervention []  - 0 Lower Extremity Assessment (monofilament, tuning fork, pulses) []  - 0 Peripheral Arterial Disease Assessment (using hand held doppler) ASSESSMENTS - Ostomy and/or Continence Assessment and Care []  - Incontinence Assessment and Management 0 []  - 0 Ostomy Care Assessment and Management (repouching, etc.) PROCESS - Coordination of Care X - Simple Patient / Family Education for ongoing care 1 15 []  - 0 Complex (extensive) Patient / Family Education for ongoing care []  - 0 Staff obtains Programmer, systems, Records, Test Results / Process Orders []  - 0 Staff telephones HHA, Nursing Homes / Clarify orders / etc []  - 0 Routine Transfer to another Facility (non-emergent condition) []  - 0 Routine Hospital Admission (non-emergent condition) []  - 0 New Admissions / Biomedical engineer / Ordering NPWT, Apligraf, etc. []  - 0 Emergency Hospital Admission (emergent condition) X- 1 10 Simple Discharge Coordination []  - 0 Complex (extensive) Discharge Coordination PROCESS - Special Needs []  - Pediatric / Minor Patient Management 0 []  - 0 Isolation Patient Management []  - 0 Hearing / Language / Visual special needs []  - 0 Assessment of Community assistance (transportation, D/C planning, etc.) []  - 0 Additional assistance / Altered mentation []  - 0 Support Surface(s) Assessment (bed, cushion, seat,  etc.) INTERVENTIONS - Wound Cleansing / Measurement [name], [name] ([id number]) X- 1 5 Simple Wound Cleansing - one wound []  - 0 Complex Wound Cleansing - multiple wounds X- 1 5 Wound  Imaging (photographs - any number of wounds) []  - 0 Wound Tracing (instead of photographs) X- 1 5 Simple Wound Measurement - one wound []  - 0 Complex Wound Measurement - multiple wounds INTERVENTIONS - Wound Dressings []  - Small Wound Dressing one or multiple wounds 0 X- 1 15 Medium Wound Dressing one or multiple wounds []  - 0 Large Wound Dressing one or multiple wounds []  - 0 Application of Medications - topical []  - 0 Application of Medications - injection INTERVENTIONS - Miscellaneous []  - External ear exam 0 []  - 0 Specimen Collection (cultures, biopsies, blood, body fluids, etc.) []  - 0 Specimen(s) / Culture(s) sent or taken to Lab for analysis []  - 0 Patient Transfer (multiple staff / Civil Service fast streamer / Similar devices) []  - 0 Simple Staple / Suture removal (25 or less) []  - 0 Complex Staple / Suture removal (26 or more) []  - 0 Hypo / Hyperglycemic Management (close monitor of Blood Glucose) []  - 0 Ankle / Brachial Index (ABI) - do not check if billed separately X- 1 5 Vital Signs Has the patient been seen at the hospital within the last three years: Yes Total Score: 80 Level Of Care: New/Established - Level 3 Electronic Signature(s) Signed: 08/21/2020 4:39:20 PM By: [name], [name] Entered By: [name], Kenia on 08/21/2020 14:02:02 [name] ([id number]) -------------------------------------------------------------------------------- Encounter Discharge Information Details Patient Name: [name], [name] Date of Service: 08/21/2020 1:30 PM Medical Record Number: [id number] Patient Account Number: [id number] Date of Birth/Sex: 1945/09/25 (74 y.o. F) Treating RN: [name] Primary Care Jasin Brazel: [name] Other Clinician: Referring  Eloni Darius: [name] Treating Amri Lien/Extender: [name] in Treatment: 7 Encounter Discharge Information Items Discharge Condition: Stable Ambulatory Status: Ambulatory Discharge Destination: Home Transportation: Private Auto Accompanied By: self Schedule Follow-up Appointment: Yes Clinical Summary of Care: Electronic Signature(s) Signed: 08/21/2020 4:39:20 PM By: [name], [name] Entered By: [name], Minus Breeding on 08/21/2020 14:02:53 [name], Cindy CMarland [name] ([id number]) -------------------------------------------------------------------------------- Lower Extremity Assessment Details Patient Name: [name], [name] Date of Service: 08/21/2020 1:30 PM Medical Record Number: [id number] Patient Account Number: [id number] Date of Birth/Sex: 03-Jan-1946 (74 y.o. F) Treating RN: [name] Primary Care Shunda Rabadi: [name] Other Clinician: Referring Amarri Michaelson: [name] Treating Zori Benbrook/Extender: Jeri Cos Weeks in Treatment: 7 Edema Assessment Assessed: [Left: Yes] [Right: No] Edema: [Left: N] [Right: o] Calf Left: Right: Point of Measurement: 32 cm From Medial Instep 38 cm Ankle Left: Right: Point of Measurement: 10 cm From Medial Instep 23.5 cm Vascular Assessment Pulses: Dorsalis Pedis Palpable: [Left:Yes] Electronic Signature(s) Signed: 08/21/2020 4:39:20 PM By: [name], [name] Entered By: [name], Kenia on 08/21/2020 13:45:12 [place], [place]. ([id number]) -------------------------------------------------------------------------------- Multi Wound Chart Details Patient Name: [name], [name] Date of Service: 08/21/2020 1:30 PM Medical Record Number: [id number] Patient Account Number: [id number] Date of Birth/Sex: 30-Aug-1945 (74 y.o. F) Treating RN: [name] Primary Care Bellamy Judson: [name] Other Clinician: Referring Candyce Gambino: [name] Treating Azucena Dart/Extender: [name] in Treatment:  7 Vital Signs Height(in): 61 Pulse(bpm): 54 Weight(lbs): 175 Blood Pressure(mmHg): 139/82 Body Mass Index(BMI): 33 Temperature(F): 98.3 Respiratory Rate(breaths/min): 18 Photos: [N/A:N/A] Wound Location: Left, Anterior Lower Leg N/A N/A Wounding Event: Puncture N/A N/A Primary Etiology: Skin Tear N/A N/A Comorbid History: Cataracts, Asthma, Hypertension, N/A N/A Osteoarthritis, Received Radiation Date Acquired: 05/18/2020 N/A N/A Weeks of Treatment: 7 N/A N/A Wound Status: Open N/A N/A Measurements L x W x D (cm) 0.7x0.8x0.3 N/A N/A Area (cm) : 0.44 N/A N/A Volume (cm) : 0.132 N/A  N/A % Reduction in Area: 71.30% N/A N/A % Reduction in Volume: 13.70% N/A N/A Classification: Full Thickness Without Exposed N/A N/A Support Structures Exudate Amount: Medium N/A N/A Exudate Type: Serous N/A N/A Exudate Color: amber N/A N/A Wound Margin: Thickened N/A N/A Granulation Amount: Large (67-100%) N/A N/A Granulation Quality: Pink, Pale N/A N/A Necrotic Amount: Small (1-33%) N/A N/A Exposed Structures: Fat Layer (Subcutaneous Tissue): N/A N/A Yes Fascia: No Tendon: No Muscle: No Joint: No Bone: No Epithelialization: None N/A N/A Treatment Notes Electronic Signature(s) Signed: 08/21/2020 4:39:20 PM By: [name], [name] Entered By: [name], Minus Breeding on 08/21/2020 13:49:22 [name], Cindy Loletha Grayer ([id number]) -------------------------------------------------------------------------------- Multi-Disciplinary Care Plan Details Patient Name: [name] Date of Service: 08/21/2020 1:30 PM Medical Record Number: [id number] Patient Account Number: [id number] Date of Birth/Sex: 06-05-1946 (74 y.o. F) Treating RN: [name] Primary Care Kadijah Shamoon: [name] Other Clinician: Referring Desha Bitner: [name] Treating Tomika Eckles/Extender: [name] in Treatment: 7 Active Inactive Wound/Skin Impairment Nursing Diagnoses: Impaired tissue  integrity Goals: Ulcer/skin breakdown will have a volume reduction of 30% by week 4 Date Initiated: 07/03/2020 Target Resolution Date: 07/31/2020 Goal Status: Active Interventions: Assess ulceration(s) every visit Notes: Electronic Signature(s) Signed: 08/21/2020 4:39:20 PM By: [name], [name] Entered By: [name], Minus Breeding on 08/21/2020 13:49:14 [name], [name] ([id number]) -------------------------------------------------------------------------------- Pain Assessment Details Patient Name: [name], [name] Date of Service: 08/21/2020 1:30 PM Medical Record Number: [id number] Patient Account Number: [id number] Date of Birth/Sex: 04-20-1946 (74 y.o. F) Treating RN: [name] Primary Care Kelsha Older: [name] Other Clinician: Referring Maceo Hernan: [name] Treating Atiba Kimberlin/Extender: [name] in Treatment: 7 Active Problems Location of Pain Severity and Description of Pain Patient Has Paino No Site Locations Rate the pain. Current Pain Level: 0 Pain Management and Medication Current Pain Management: Electronic Signature(s) Signed: 08/21/2020 4:39:20 PM By: [name], [name] Entered By: [name], Kenia on 08/21/2020 13:37:51 [name], Cindy Na ([id number]) -------------------------------------------------------------------------------- Patient/Caregiver Education Details Patient Name: [name] Date of Service: 08/21/2020 1:30 PM Medical Record Number: [id number] Patient Account Number: [id number] Date of Birth/Gender: 1945/08/16 (74 y.o. F) Treating RN: [name] Primary Care Physician: [name] Other Clinician: Referring Physician: [name] Treating Physician/Extender: [name] in Treatment: 7 Education Assessment Education Provided To: Patient Education Topics Provided Notes educated on new dressing Electronic Signature(s) Signed: 08/21/2020 4:39:20 PM By: [name], Minus Breeding  RN Entered By: [name], Minus Breeding on 08/21/2020 14:02:26 [name], Cindy CMarland [name] ([id number]) -------------------------------------------------------------------------------- Wound Assessment Details Patient Name: [name], Meleah C. Date of Service: 08/21/2020 1:30 PM Medical Record Number: [id number] Patient Account Number: [id number] Date of Birth/Sex: Jun 13, 1946 (74 y.o. F) Treating RN: [name] Primary Care Jaynee Winters: [name] Other Clinician: Referring Khyron Garno: [name] Treating Indigo Barbian/Extender: [name] in Treatment: 7 Wound Status Wound Number: 1 Primary Skin Tear Etiology: Wound Location: Left, Anterior Lower Leg Wound Status: Open Wounding Event: Puncture Comorbid Cataracts, Asthma, Hypertension, Osteoarthritis, Date Acquired: 05/18/2020 History: Received Radiation Weeks Of Treatment: 7 Clustered Wound: No Photos Wound Measurements Length: (cm) 0.7 Width: (cm) 0.8 Depth: (cm) 0.3 Area: (cm) 0.44 Volume: (cm) 0.132 % Reduction in Area: 71.3% % Reduction in Volume: 13.7% Epithelialization: None Tunneling: No Undermining: No Wound Description Classification: Full Thickness Without Exposed Support Structu Wound Margin: Thickened Exudate Amount: Medium Exudate Type: Serous Exudate Color: amber res Foul Odor After Cleansing: No Slough/Fibrino Yes Wound Bed Granulation Amount: Large (67-100%) Exposed Structure Granulation Quality: Pink, Pale Fascia Exposed: No Necrotic Amount: Small (1-33%) Fat Layer (Subcutaneous  Tissue) Exposed: Yes Necrotic Quality: Adherent Slough Tendon Exposed: No Muscle Exposed: No Joint Exposed: No Bone Exposed: No Treatment Notes Wound #1 (Lower Leg) Wound Laterality: Left, Anterior Cleanser Peri-Wound Care Topical Gentamicin Akkerman, Lada C. ([id number]) Discharge Instruction: Apply as directed by [place] Giammona. Primary Dressing Endoform 2x2 (in/in) Discharge Instruction: Apply Endoform as  directed Secondary Dressing Non-Adherent Pad 3x8 (in/in) Secured With [place] Paper Tape, 1x10 (in/yd) Compression Wrap Compression Stockings Add-Ons Electronic Signature(s) Signed: 08/21/2020 4:39:20 PM By: [name], [name] Entered By: [name], Kenia on 08/21/2020 13:43:56 Crispen, Pamla C. ([id number]) -------------------------------------------------------------------------------- Vitals Details Patient Name: [name], Eriyana C. Date of Service: 08/21/2020 1:30 PM Medical Record Number: [id number] Patient Account Number: [id number] Date of Birth/Sex: 1946-03-11 (74 y.o. F) Treating RN: Cornell Barman Primary Care Doll Frazee: [name] Other Clinician: Referring Husayn Reim: [name] Treating Nakai Pollio/Extender: [name] in Treatment: 7 Vital Signs Time Taken: 13:35 Temperature (F): 98.3 Height (in): 61 Pulse (bpm): 69 Weight (lbs): 175 Respiratory Rate (breaths/min): 18 Body Mass Index (BMI): 33.1 Blood Pressure (mmHg): 139/82 Reference Range: 80 - 120 mg / dl Electronic Signature(s) Signed: 08/21/2020 4:39:20 PM By: [name], [name] Entered By: [name], Minus Breeding on 08/21/2020 13:37:19

## 2020-08-21 NOTE — Progress Notes (Addendum)
KATALYNA, SOCARRAS ([id number]) Visit Report for 08/21/2020 Chief Complaint Document Details Patient Name: [name], [name]. Date of Service: 08/21/2020 1:30 PM Medical Record Number: [id number] Patient Account Number: [id number] Date of Birth/Sex: 03/26/1946 (74 y.o. F) Treating RN: Cornell Barman Primary Care Provider: [name] Other Clinician: Referring Provider: [name] Treating Provider/Extender: [name] in Treatment: 7 Information Obtained from: Patient Chief Complaint Left LE Ulcer Electronic Signature(s) Signed: 08/21/2020 1:48:27 PM By: [name] Entered By: Worthy Keeler on 08/21/2020 13:48:27 [name], [name] ([id number]) -------------------------------------------------------------------------------- HPI Details Patient Name: Cindy Ohm C. Date of Service: 08/21/2020 1:30 PM Medical Record Number: [id number] Patient Account Number: [id number] Date of Birth/Sex: [birth year] (74 y.o. F) Treating RN: Cornell Barman Primary Care Provider: [name] Other Clinician: Referring Provider: [name] Treating Provider/Extender: [name] in Treatment: 7 History of Present Illness HPI Description: 07/03/2020 upon evaluation today patient appears to be doing unfortunately somewhat poorly in regard to her leg where she struck this causing a wound 05/18/2020. She states that since that time she has had mainly an eschar covering sometimes it will drain sometimes not its been infected a couple times as well and she has been on several antibiotics including 5 days left of doxycycline that she is currently taking. No sharp debridement has been undertaken by anyone at this point. She tells me that she does have a history of leg swelling she is supposed to wear compression socks but she does not always do this. She also tells me that she does have hypertension but otherwise no major medical problems. She has not really been doing anything  compression wise since this was injured in October. 07/10/2020 on evaluation today patient actually appears to be making some progress here. Fortunately there is no signs of active infection systemically locally she still has some evidence of erythema infection although this is minimal compared to what it was even last week. Overall I am pleased in that regard. There does not appear to be any signs of active infection spreading outside aware of marked that is good news as well. In general the patient seems to be doing well although I think we already continue to probably treat her we likely need to switch her to Bactrim DS as the doxycycline is resistant that is what she was taking as of last week. 07/17/2020 upon evaluation today patient appears to be doing excellent in regard to her leg ulcer. In fact I think we can probably switch to collagen-based dressing at this point as she seems to be doing so well. There is no evidence of infection at this time and I think that she is making good progress. I do want her to continue with the oral antibiotics at this point. 07/31/2020 upon evaluation today patient appears to be doing quite well in regard to her lower extremity ulcer. She has been tolerating the dressing changes without complication. Fortunately there is no evidence of active infection at this time. I been very pleased with where things stand currently. No fevers, chills, nausea, vomiting, or diarrhea. 08/07/2020 on evaluation today patient appears to continue to make signs of good improvement with regard to her wound. I am very pleased with where things stand this is measuring smaller and overall I think she is doing excellent. 08/14/2020 upon evaluation today patient appears to be doing well in regard to her leg ulcer. She has been tolerating the dressing changes without complication. Fortunately there is no signs of active infection at  this time which is also great news. No fevers, chills,  nausea, vomiting, or diarrhea. 08/21/2020 on evaluation today patient appears to be doing well with regard to her wound in general. With that being said she does have some issues currently with still a small area that had a little bit more depth roughly at the 11:00 location upon inspection. There is a little bit of undermining/tunneling here. With that being said I think we may want to try a little bit different dressing to see if this can be beneficial for her I am thinking endoform. Electronic Signature(s) Signed: 08/21/2020 1:57:30 PM By: [name] Entered By: Worthy Keeler on 08/21/2020 13:57:29 [name], [name] ([id number]) -------------------------------------------------------------------------------- Physical Exam Details Patient Name: Fissel, Orly C. Date of Service: 08/21/2020 1:30 PM Medical Record Number: [id number] Patient Account Number: [id number] Date of Birth/Sex: 1946-02-02 (74 y.o. F) Treating RN: Cornell Barman Primary Care Provider: [name] Other Clinician: Referring Provider: [name] Treating Provider/Extender: [name] in Treatment: 7 Constitutional Well-nourished and well-hydrated in no acute distress. Respiratory normal breathing without difficulty. Psychiatric this patient is able to make decisions and demonstrates good insight into disease process. Alert and Oriented x 3. pleasant and cooperative. Notes Patient's wound bed actually showed signs of good granulation and epithelization at this point. There does not appear to be any evidence of active infection which is great news and overall very pleased with where things stand. I think overall she is doing excellent but we do need to get this small area of increased depth filled in and this is something we have been noted for several weeks. I would make a change today Electronic Signature(s) Signed: 08/21/2020 1:57:50 PM By: [name] Entered By: Worthy Keeler on  08/21/2020 13:57:50 [name], [name] ([id number]) -------------------------------------------------------------------------------- Physician Orders Details Patient Name: Cindy Ohm C. Date of Service: 08/21/2020 1:30 PM Medical Record Number: [id number] Patient Account Number: [id number] Date of Birth/Sex: 09-07-1945 (74 y.o. F) Treating RN: [name] Primary Care Provider: [name] Other Clinician: Referring Provider: [name] Treating Provider/Extender: [name] in Treatment: 7 Verbal / Phone Orders: No Diagnosis Coding ICD-10 Coding Code Description I87.2 Venous insufficiency (chronic) (peripheral) S81.802A Unspecified open wound, left lower leg, initial encounter L97.822 Non-pressure chronic ulcer of other part of left lower leg with fat layer exposed [place] (primary) hypertension Follow-up Appointments o Return Appointment in 1 week. Bathing/ Shower/ Hygiene o May shower; gently cleanse wound with antibacterial soap, rinse and pat dry prior to dressing wounds Edema Control - Lymphedema / Segmental Compressive Device / Other o Elevate, Exercise Daily and Avoid Standing for Long Periods of Time. Wound Treatment Wound #1 - Lower Leg Wound Laterality: Left, Anterior Topical: Gentamicin 3 x Per Week/30 Days Discharge Instructions: Apply as directed by provider. Primary Dressing: Endoform 2x2 (in/in) (Generic) 3 x Per Week/30 Days Discharge Instructions: Apply Endoform as directed Secondary Dressing: Non-Adherent Pad 3x8 (in/in) (Generic) 3 x Per Week/30 Days Secured With: Curad Paper Tape, 1x10 (in/yd) (Generic) 3 x Per Week/30 Days Electronic Signature(s) Signed: 08/21/2020 4:39:20 PM By: [name], [name] Signed: 08/21/2020 4:54:01 PM By: [name] Entered By: [name], Minus Breeding on 08/21/2020 14:01:06 [name], [name]  ([id number]) -------------------------------------------------------------------------------- Problem List Details Patient Name: [name], [name] Date of Service: 08/21/2020 1:30 PM Medical Record Number: [id number] Patient Account Number: [id number] Date of Birth/Sex: Jan 18, 1946 (74 y.o. F) Treating RN: Cornell Barman Primary Care Provider: [name] Other Clinician: Referring Provider:  Masoud, Javed Treating Provider/Extender: Jeri Cos Weeks in Treatment: 7 Active Problems ICD-10 Encounter Code Description Active Date MDM Diagnosis I87.2 Venous insufficiency (chronic) (peripheral) 07/03/2020 No Yes S81.802A Unspecified open wound, left lower leg, initial encounter 07/03/2020 No Yes L97.822 Non-pressure chronic ulcer of other part of left lower leg with fat layer 07/03/2020 No Yes exposed [place] (primary) hypertension 07/03/2020 No Yes Inactive Problems Resolved Problems Electronic Signature(s) Signed: 08/21/2020 1:48:22 PM By: [name] Entered By: Worthy Keeler on 08/21/2020 13:48:22 Cerasoli, Oleva C. ([id number]) -------------------------------------------------------------------------------- Progress Note Details Patient Name: [name], [name] Date of Service: 08/21/2020 1:30 PM Medical Record Number: [id number] Patient Account Number: [id number] Date of Birth/Sex: 14-Feb-1946 (74 y.o. F) Treating RN: Cornell Barman Primary Care Provider: [name] Other Clinician: Referring Provider: [name] Treating Provider/Extender: [name] in Treatment: 7 Subjective Chief Complaint Information obtained from Patient Left LE Ulcer History of Present Illness (HPI) 07/03/2020 upon evaluation today patient appears to be doing unfortunately somewhat poorly in regard to her leg where she struck this causing a wound 05/18/2020. She states that since that time she has had mainly an eschar covering sometimes it will drain sometimes not its  been infected a couple times as well and she has been on several antibiotics including 5 days left of doxycycline that she is currently taking. No sharp debridement has been undertaken by anyone at this point. She tells me that she does have a history of leg swelling she is supposed to wear compression socks but she does not always do this. She also tells me that she does have hypertension but otherwise no major medical problems. She has not really been doing anything compression wise since this was injured in October. 07/10/2020 on evaluation today patient actually appears to be making some progress here. Fortunately there is no signs of active infection systemically locally she still has some evidence of erythema infection although this is minimal compared to what it was even last week. Overall I am pleased in that regard. There does not appear to be any signs of active infection spreading outside aware of marked that is good news as well. In general the patient seems to be doing well although I think we already continue to probably treat her we likely need to switch her to Bactrim DS as the doxycycline is resistant that is what she was taking as of last week. 07/17/2020 upon evaluation today patient appears to be doing excellent in regard to her leg ulcer. In fact I think we can probably switch to collagen-based dressing at this point as she seems to be doing so well. There is no evidence of infection at this time and I think that she is making good progress. I do want her to continue with the oral antibiotics at this point. 07/31/2020 upon evaluation today patient appears to be doing quite well in regard to her lower extremity ulcer. She has been tolerating the dressing changes without complication. Fortunately there is no evidence of active infection at this time. I been very pleased with where things stand currently. No fevers, chills, nausea, vomiting, or diarrhea. 08/07/2020 on evaluation today  patient appears to continue to make signs of good improvement with regard to her wound. I am very pleased with where things stand this is measuring smaller and overall I think she is doing excellent. 08/14/2020 upon evaluation today patient appears to be doing well in regard to her leg ulcer. She has been tolerating the dressing changes without  complication. Fortunately there is no signs of active infection at this time which is also great news. No fevers, chills, nausea, vomiting, or diarrhea. 08/21/2020 on evaluation today patient appears to be doing well with regard to her wound in general. With that being said she does have some issues currently with still a small area that had a little bit more depth roughly at the 11:00 location upon inspection. There is a little bit of undermining/tunneling here. With that being said I think we may want to try a little bit different dressing to see if this can be beneficial for her I am thinking endoform. Objective Constitutional Well-nourished and well-hydrated in no acute distress. Vitals Time Taken: 1:35 PM, Height: 61 in, Weight: 175 lbs, BMI: 33.1, Temperature: 98.3 F, Pulse: 69 bpm, Respiratory Rate: 18 breaths/min, Blood Pressure: 139/82 mmHg. Respiratory normal breathing without difficulty. Psychiatric this patient is able to make decisions and demonstrates good insight into disease process. Alert and Oriented x 3. pleasant and cooperative. General Notes: Patient's wound bed actually showed signs of good granulation and epithelization at this point. There does not appear to be any [name], [name] ([id number]) evidence of active infection which is great news and overall very pleased with where things stand. I think overall she is doing excellent but we do need to get this small area of increased depth filled in and this is something we have been noted for several weeks. I would make a change today Integumentary (Hair, Skin) Wound #1 status is  Open. Original cause of wound was Puncture. The wound is located on the Left,Anterior Lower Leg. The wound measures 0.7cm length x 0.8cm width x 0.3cm depth; 0.44cm^2 area and 0.132cm^3 volume. There is Fat Layer (Subcutaneous Tissue) exposed. There is no tunneling or undermining noted. There is a medium amount of serous drainage noted. The wound margin is thickened. There is large (67- 100%) pink, pale granulation within the wound bed. There is a small (1-33%) amount of necrotic tissue within the wound bed including Adherent Slough. Assessment Active Problems ICD-10 Venous insufficiency (chronic) (peripheral) Unspecified open wound, left lower leg, initial encounter Non-pressure chronic ulcer of other part of left lower leg with fat layer exposed Essential (primary) hypertension Plan Follow-up Appointments: Return Appointment in 1 week. Bathing/ Shower/ Hygiene: May shower; gently cleanse wound with antibacterial soap, rinse and pat dry prior to dressing wounds Edema Control - Lymphedema / Segmental Compressive Device / Other: Elevate, Exercise Daily and Avoid Standing for Long Periods of Time. WOUND #1: - Lower Leg Wound Laterality: Left, Anterior Topical: Gentamicin 3 x Per Week/30 Days Discharge Instructions: Apply as directed by provider. Primary Dressing: Endoform 2x2 (in/in) (Generic) 3 x Per Week/30 Days Discharge Instructions: Apply Endoform as directed Secondary Dressing: Non-Adherent Pad 3x8 (in/in) (Generic) 3 x Per Week/30 Days Secured With: Curad Paper Tape, 1x10 (in/yd) (Generic) 3 x Per Week/30 Days 1. Would recommend that we go and switch to a endoform dressing which I think may do better as far as being a little bit more substantial and try to help keep this area open to allow to granulate in from the bottom out. 2. I am also can recommend she can still continue to use the gentamicin since that seems to be helpful for her. 3. I am also can recommend that she continue  with the cover dressing and then subsequently she is using her compression socks if needed in order to keep edema down. She is also elevating as needed. We will see  patient back for reevaluation in 1 week here in the clinic. If anything worsens or changes patient will contact our office for additional recommendations. Electronic Signature(s) Signed: 08/21/2020 1:58:38 PM By: [name] Entered By: Worthy Keeler on 08/21/2020 13:58:38 [name], [name] ([id number]) -------------------------------------------------------------------------------- SuperBill Details Patient Name: Cindy Ohm C. Date of Service: 08/21/2020 Medical Record Number: [id number] Patient Account Number: [id number] Date of Birth/Sex: 09-09-1945 (74 y.o. F) Treating RN: Cornell Barman Primary Care Provider: [name] Other Clinician: Referring Provider: [name] Treating Provider/Extender: [name] in Treatment: 7 Diagnosis Coding ICD-10 Codes Code Description I87.2 Venous insufficiency (chronic) (peripheral) S81.802A Unspecified open wound, left lower leg, initial encounter L97.822 Non-pressure chronic ulcer of other part of left lower leg with fat layer exposed [place] (primary) hypertension Facility Procedures CPT4 Code: [id number] Description: 75102 - WOUND CARE VISIT-LEV 3 EST PT Modifier: Quantity: 1 Physician Procedures CPT4 Code: [id number] Description: 24235 - WC PHYS LEVEL 3 - EST PT Modifier: Quantity: 1 CPT4 Code: Description: ICD-10 Diagnosis Description I87.2 Venous insufficiency (chronic) (peripheral) S81.802A Unspecified open wound, left lower leg, initial encounter L97.822 Non-pressure chronic ulcer of other part of left lower leg with fat lay [place]  (primary) hypertension Modifier: er exposed Quantity: Electronic Signature(s) Signed: 08/21/2020 4:39:20 PM By: [name], [name] Signed: 08/21/2020 4:54:01 PM By: [name] Previous  Signature: 08/21/2020 1:58:50 PM Version By: [name] Entered By: [name], Minus Breeding on 08/21/2020 14:02:13

## 2020-08-25 ENCOUNTER — Other Ambulatory Visit: Payer: Self-pay | Admitting: Internal Medicine

## 2020-08-26 ENCOUNTER — Other Ambulatory Visit (INDEPENDENT_AMBULATORY_CARE_PROVIDER_SITE_OTHER): Payer: Self-pay | Admitting: Vascular Surgery

## 2020-08-26 DIAGNOSIS — I83009 Varicose veins of unspecified lower extremity with ulcer of unspecified site: Secondary | ICD-10-CM

## 2020-08-26 DIAGNOSIS — M79605 Pain in left leg: Secondary | ICD-10-CM

## 2020-08-26 DIAGNOSIS — M79604 Pain in right leg: Secondary | ICD-10-CM

## 2020-08-27 ENCOUNTER — Encounter (INDEPENDENT_AMBULATORY_CARE_PROVIDER_SITE_OTHER): Payer: Self-pay | Admitting: Nurse Practitioner

## 2020-08-27 ENCOUNTER — Other Ambulatory Visit: Payer: Self-pay

## 2020-08-27 ENCOUNTER — Ambulatory Visit (INDEPENDENT_AMBULATORY_CARE_PROVIDER_SITE_OTHER): Payer: Medicare Other | Admitting: Nurse Practitioner

## 2020-08-27 ENCOUNTER — Ambulatory Visit (INDEPENDENT_AMBULATORY_CARE_PROVIDER_SITE_OTHER): Payer: Medicare Other

## 2020-08-27 VITALS — BP 168/93 | HR 69 | Ht 61.0 in | Wt 172.0 lb

## 2020-08-27 DIAGNOSIS — M79604 Pain in right leg: Secondary | ICD-10-CM

## 2020-08-27 DIAGNOSIS — L299 Pruritus, unspecified: Secondary | ICD-10-CM | POA: Insufficient documentation

## 2020-08-27 DIAGNOSIS — I83008 Varicose veins of unspecified lower extremity with ulcer other part of lower leg: Secondary | ICD-10-CM

## 2020-08-27 DIAGNOSIS — J3081 Allergic rhinitis due to animal (cat) (dog) hair and dander: Secondary | ICD-10-CM | POA: Insufficient documentation

## 2020-08-27 DIAGNOSIS — L97909 Non-pressure chronic ulcer of unspecified part of unspecified lower leg with unspecified severity: Secondary | ICD-10-CM

## 2020-08-27 DIAGNOSIS — L97822 Non-pressure chronic ulcer of other part of left lower leg with fat layer exposed: Secondary | ICD-10-CM | POA: Diagnosis not present

## 2020-08-27 DIAGNOSIS — L97802 Non-pressure chronic ulcer of other part of unspecified lower leg with fat layer exposed: Secondary | ICD-10-CM | POA: Diagnosis not present

## 2020-08-27 DIAGNOSIS — J309 Allergic rhinitis, unspecified: Secondary | ICD-10-CM | POA: Insufficient documentation

## 2020-08-27 DIAGNOSIS — J4540 Moderate persistent asthma, uncomplicated: Secondary | ICD-10-CM | POA: Insufficient documentation

## 2020-08-27 DIAGNOSIS — I10 Essential (primary) hypertension: Secondary | ICD-10-CM | POA: Diagnosis not present

## 2020-08-27 DIAGNOSIS — E782 Mixed hyperlipidemia: Secondary | ICD-10-CM

## 2020-08-27 DIAGNOSIS — M79605 Pain in left leg: Secondary | ICD-10-CM | POA: Diagnosis not present

## 2020-08-27 DIAGNOSIS — I83009 Varicose veins of unspecified lower extremity with ulcer of unspecified site: Secondary | ICD-10-CM

## 2020-08-28 ENCOUNTER — Encounter: Payer: Medicare Other | Attending: Physician Assistant | Admitting: Physician Assistant

## 2020-08-28 DIAGNOSIS — L97822 Non-pressure chronic ulcer of other part of left lower leg with fat layer exposed: Secondary | ICD-10-CM | POA: Insufficient documentation

## 2020-08-28 DIAGNOSIS — I872 Venous insufficiency (chronic) (peripheral): Secondary | ICD-10-CM | POA: Insufficient documentation

## 2020-08-28 DIAGNOSIS — J3089 Other allergic rhinitis: Secondary | ICD-10-CM | POA: Diagnosis not present

## 2020-08-28 DIAGNOSIS — J301 Allergic rhinitis due to pollen: Secondary | ICD-10-CM | POA: Diagnosis not present

## 2020-08-28 NOTE — Progress Notes (Addendum)
[name] ([id number]) Visit Report for 08/28/2020 Chief Complaint Document Details Patient Name: [name], [name]. Date of Service: 08/28/2020 1:30 PM Medical Record Number: [id number] Patient Account Number: [id number] Date of Birth/Sex: 07-Oct-1945 (74 y.o. F) Treating RN: Carlene Coria Primary Care Provider: [name] Other Clinician: Referring Provider: [name] Treating Provider/Extender: [name] in Treatment: 8 Information Obtained from: Patient Chief Complaint Left LE Ulcer Electronic Signature(s) Signed: 08/28/2020 1:33:59 PM By: [name] Entered By: Worthy Keeler on 08/28/2020 13:33:59 Scherman, Sharlette CMarland Kitchen ([id number]) -------------------------------------------------------------------------------- HPI Details Patient Name: Cindy Ohm C. Date of Service: 08/28/2020 1:30 PM Medical Record Number: [id number] Patient Account Number: [id number] Date of Birth/Sex: 1946/02/25 (74 y.o. F) Treating RN: Carlene Coria Primary Care Provider: [name] Other Clinician: Referring Provider: [name] Treating Provider/Extender: [name] in Treatment: 8 History of Present Illness HPI Description: 07/03/2020 upon evaluation today patient appears to be doing unfortunately somewhat poorly in regard to her leg where she struck this causing a wound 05/18/2020. She states that since that time she has had mainly an eschar covering sometimes it will drain sometimes not its been infected a couple times as well and she has been on several antibiotics including 5 days left of doxycycline that she is currently taking. No sharp debridement has been undertaken by anyone at this point. She tells me that she does have a history of leg swelling she is supposed to wear compression socks but she does not always do this. She also tells me that she does have hypertension but otherwise no major medical problems. She has not really been doing anything  compression wise since this was injured in October. 07/10/2020 on evaluation today patient actually appears to be making some progress here. Fortunately there is no signs of active infection systemically locally she still has some evidence of erythema infection although this is minimal compared to what it was even last week. Overall I am pleased in that regard. There does not appear to be any signs of active infection spreading outside aware of marked that is good news as well. In general the patient seems to be doing well although I think we already continue to probably treat her we likely need to switch her to Bactrim DS as the doxycycline is resistant that is what she was taking as of last week. 07/17/2020 upon evaluation today patient appears to be doing excellent in regard to her leg ulcer. In fact I think we can probably switch to collagen-based dressing at this point as she seems to be doing so well. There is no evidence of infection at this time and I think that she is making good progress. I do want her to continue with the oral antibiotics at this point. 07/31/2020 upon evaluation today patient appears to be doing quite well in regard to her lower extremity ulcer. She has been tolerating the dressing changes without complication. Fortunately there is no evidence of active infection at this time. I been very pleased with where things stand currently. No fevers, chills, nausea, vomiting, or diarrhea. 08/07/2020 on evaluation today patient appears to continue to make signs of good improvement with regard to her wound. I am very pleased with where things stand this is measuring smaller and overall I think she is doing excellent. 08/14/2020 upon evaluation today patient appears to be doing well in regard to her leg ulcer. She has been tolerating the dressing changes without complication. Fortunately there is no signs of active infection at  this time which is also great news. No fevers, chills,  nausea, vomiting, or diarrhea. 08/21/2020 on evaluation today patient appears to be doing well with regard to her wound in general. With that being said she does have some issues currently with still a small area that had a little bit more depth roughly at the 11:00 location upon inspection. There is a little bit of undermining/tunneling here. With that being said I think we may want to try a little bit different dressing to see if this can be beneficial for her I am thinking endoform. 08/28/2020 upon evaluation today patient appears to be making a little bit of progress. This is measuring a little bit better I do believe the endoform has been beneficial for her. I did keep the small hole open and I do feel like that is showing some signs of improvement all of which is great news. Electronic Signature(s) Signed: 08/28/2020 1:56:26 PM By: [name] Entered By: Worthy Keeler on 08/28/2020 13:56:26 Gabrielson, Micala Loletha Grayer ([id number]) -------------------------------------------------------------------------------- Physical Exam Details Patient Name: [name], [name] Date of Service: 08/28/2020 1:30 PM Medical Record Number: [id number] Patient Account Number: [id number] Date of Birth/Sex: 27-Aug-1945 (74 y.o. F) Treating RN: Carlene Coria Primary Care Provider: [name] Other Clinician: Referring Provider: [name] Treating Provider/Extender: [name] in Treatment: 8 Constitutional Well-nourished and well-hydrated in no acute distress. Respiratory normal breathing without difficulty. Psychiatric this patient is able to make decisions and demonstrates good insight into disease process. Alert and Oriented x 3. pleasant and cooperative. Notes Upon inspection patient's wound bed actually showed signs of good granulation at this time. There does not appear to be any evidence of infection which is good and overall I am extremely pleased with where things stand today. No  fevers, chills, nausea, vomiting, or diarrhea. Electronic Signature(s) Signed: 08/28/2020 1:56:44 PM By: [name] Entered By: Worthy Keeler on 08/28/2020 13:56:44 Daquila, [name] ([id number]) -------------------------------------------------------------------------------- Physician Orders Details Patient Name: Cindy Ohm C. Date of Service: 08/28/2020 1:30 PM Medical Record Number: [id number] Patient Account Number: [id number] Date of Birth/Sex: [birth year] (74 y.o. F) Treating RN: Carlene Coria Primary Care Provider: [name] Other Clinician: Referring Provider: [name] Treating Provider/Extender: [name] in Treatment: 8 Verbal / Phone Orders: No Diagnosis Coding ICD-10 Coding Code Description I87.2 Venous insufficiency (chronic) (peripheral) S81.802A Unspecified open wound, left lower leg, initial encounter L97.822 Non-pressure chronic ulcer of other part of left lower leg with fat layer exposed [place] (primary) hypertension Follow-up Appointments o Return Appointment in 1 week. Bathing/ Shower/ Hygiene o May shower; gently cleanse wound with antibacterial soap, rinse and pat dry prior to dressing wounds Edema Control - Lymphedema / Segmental Compressive Device / Other o Elevate, Exercise Daily and Avoid Standing for Long Periods of Time. Wound Treatment Wound #1 - Lower Leg Wound Laterality: Left, Anterior Topical: Gentamicin 3 x Per Week/30 Days Discharge Instructions: Apply as directed by provider. Primary Dressing: Endoform 2x2 (in/in) (Generic) 3 x Per Week/30 Days Discharge Instructions: Apply Endoform as directed Secondary Dressing: Non-Adherent Pad 3x8 (in/in) (Generic) 3 x Per Week/30 Days Secured With: Curad Paper Tape, 1x10 (in/yd) (Generic) 3 x Per Week/30 Days Electronic Signature(s) Signed: 08/28/2020 3:44:05 PM By: [name] Signed: 09/02/2020 9:44:48 AM By: Carlene Coria RN Entered By: Carlene Coria on  08/28/2020 13:53:07 Istre, Myliah C. ([id number]) -------------------------------------------------------------------------------- Problem List Details Patient Name: [name], [name] Date of Service: 08/28/2020 1:30 PM Medical Record Number: [id number]  Patient Account Number: [id number] Date of Birth/Sex: 1945-11-24 (75 y.o. F) Treating RN: Carlene Coria Primary Care Provider: [name] Other Clinician: Referring Provider: [name] Treating Provider/Extender: [name] in Treatment: 8 Active Problems ICD-10 Encounter Code Description Active Date MDM Diagnosis I87.2 Venous insufficiency (chronic) (peripheral) 07/03/2020 No Yes S81.802A Unspecified open wound, left lower leg, initial encounter 07/03/2020 No Yes L97.822 Non-pressure chronic ulcer of other part of left lower leg with fat layer 07/03/2020 No Yes exposed [place] (primary) hypertension 07/03/2020 No Yes Inactive Problems Resolved Problems Electronic Signature(s) Signed: 08/28/2020 1:33:47 PM By: [name] Entered By: Worthy Keeler on 08/28/2020 13:33:47 Nunnery, Naesha C. ([id number]) -------------------------------------------------------------------------------- Progress Note Details Patient Name: Emerick, [name] Date of Service: 08/28/2020 1:30 PM Medical Record Number: [id number] Patient Account Number: [id number] Date of Birth/Sex: 06-18-1946 (74 y.o. F) Treating RN: Carlene Coria Primary Care Provider: [name] Other Clinician: Referring Provider: [name] Treating Provider/Extender: [name] in Treatment: 8 Subjective Chief Complaint Information obtained from Patient Left LE Ulcer History of Present Illness (HPI) 07/03/2020 upon evaluation today patient appears to be doing unfortunately somewhat poorly in regard to her leg where she struck this causing a wound 05/18/2020. She states that since that time she has had mainly an eschar covering  sometimes it will drain sometimes not its been infected a couple times as well and she has been on several antibiotics including 5 days left of doxycycline that she is currently taking. No sharp debridement has been undertaken by anyone at this point. She tells me that she does have a history of leg swelling she is supposed to wear compression socks but she does not always do this. She also tells me that she does have hypertension but otherwise no major medical problems. She has not really been doing anything compression wise since this was injured in October. 07/10/2020 on evaluation today patient actually appears to be making some progress here. Fortunately there is no signs of active infection systemically locally she still has some evidence of erythema infection although this is minimal compared to what it was even last week. Overall I am pleased in that regard. There does not appear to be any signs of active infection spreading outside aware of marked that is good news as well. In general the patient seems to be doing well although I think we already continue to probably treat her we likely need to switch her to Bactrim DS as the doxycycline is resistant that is what she was taking as of last week. 07/17/2020 upon evaluation today patient appears to be doing excellent in regard to her leg ulcer. In fact I think we can probably switch to collagen-based dressing at this point as she seems to be doing so well. There is no evidence of infection at this time and I think that she is making good progress. I do want her to continue with the oral antibiotics at this point. 07/31/2020 upon evaluation today patient appears to be doing quite well in regard to her lower extremity ulcer. She has been tolerating the dressing changes without complication. Fortunately there is no evidence of active infection at this time. I been very pleased with where things stand currently. No fevers, chills, nausea, vomiting, or  diarrhea. 08/07/2020 on evaluation today patient appears to continue to make signs of good improvement with regard to her wound. I am very pleased with where things stand this is measuring smaller and overall I think she is doing excellent.  08/14/2020 upon evaluation today patient appears to be doing well in regard to her leg ulcer. She has been tolerating the dressing changes without complication. Fortunately there is no signs of active infection at this time which is also great news. No fevers, chills, nausea, vomiting, or diarrhea. 08/21/2020 on evaluation today patient appears to be doing well with regard to her wound in general. With that being said she does have some issues currently with still a small area that had a little bit more depth roughly at the 11:00 location upon inspection. There is a little bit of undermining/tunneling here. With that being said I think we may want to try a little bit different dressing to see if this can be beneficial for her I am thinking endoform. 08/28/2020 upon evaluation today patient appears to be making a little bit of progress. This is measuring a little bit better I do believe the endoform has been beneficial for her. I did keep the small hole open and I do feel like that is showing some signs of improvement all of which is great news. Objective Constitutional Well-nourished and well-hydrated in no acute distress. Vitals Time Taken: 1:37 PM, Height: 61 in, Weight: 175 lbs, BMI: 33.1, Temperature: 98.8 F, Pulse: 78 bpm, Respiratory Rate: 18 breaths/min, Blood Pressure: 130/70 mmHg. Respiratory normal breathing without difficulty. Psychiatric this patient is able to make decisions and demonstrates good insight into disease process. Alert and Oriented x 3. pleasant and cooperative. [place], Christmas C. ([id number]) General Notes: Upon inspection patient's wound bed actually showed signs of good granulation at this time. There does not appear to be  any evidence of infection which is good and overall I am extremely pleased with where things stand today. No fevers, chills, nausea, vomiting, or diarrhea. Integumentary (Hair, Skin) Wound #1 status is Open. Original cause of wound was Puncture. The wound is located on the Left,Anterior Lower Leg. The wound measures 0.7cm length x 0.5cm width x 0.4cm depth; 0.275cm^2 area and 0.11cm^3 volume. There is Fat Layer (Subcutaneous Tissue) exposed. There is no undermining noted, however, there is tunneling at 11:00 with a maximum distance of 0.6cm. There is a medium amount of serous drainage noted. The wound margin is flat and intact. There is large (67-100%) pink, pale granulation within the wound bed. There is a small (1-33%) amount of necrotic tissue within the wound bed including Adherent Slough. Assessment Active Problems ICD-10 Venous insufficiency (chronic) (peripheral) Unspecified open wound, left lower leg, initial encounter Non-pressure chronic ulcer of other part of left lower leg with fat layer exposed [place] (primary) hypertension Plan Follow-up Appointments: Return Appointment in 1 week. Bathing/ Shower/ Hygiene: May shower; gently cleanse wound with antibacterial soap, rinse and pat dry prior to dressing wounds Edema Control - Lymphedema / Segmental Compressive Device / Other: Elevate, Exercise Daily and Avoid Standing for Long Periods of Time. WOUND #1: - Lower Leg Wound Laterality: Left, Anterior Topical: Gentamicin 3 x Per Week/30 Days Discharge Instructions: Apply as directed by provider. Primary Dressing: Endoform 2x2 (in/in) (Generic) 3 x Per Week/30 Days Discharge Instructions: Apply Endoform as directed Secondary Dressing: Non-Adherent Pad 3x8 (in/in) (Generic) 3 x Per Week/30 Days Secured With: Curad Paper Tape, 1x10 (in/yd) (Generic) 3 x Per Week/30 Days 1. Would recommend currently that we going continue with the wound care measures as before. I feel like that the  patient is making good progress here and hopefully the endoform will continue to see improvement week of a week. We will get a plan  to see her back for reevaluation next week to see where things stand the patient is in agreement with that plan. 2. We are going to continue with the use of the patient's own compression stocking as well as elevation at home try to keep edema under good control. We will see patient back for reevaluation in 1 week here in the clinic. If anything worsens or changes patient will contact our office for additional recommendations. Electronic Signature(s) Signed: 08/28/2020 1:57:21 PM By: [name] Entered By: Worthy Keeler on 08/28/2020 13:57:21 Schoeppner, Tinzlee CMarland Kitchen ([id number]) -------------------------------------------------------------------------------- SuperBill Details Patient Name: Cindy Ohm C. Date of Service: 08/28/2020 Medical Record Number: [id number] Patient Account Number: [id number] Date of Birth/Sex: 1946-06-02 (74 y.o. F) Treating RN: Carlene Coria Primary Care Provider: [name] Other Clinician: Referring Provider: [name] Treating Provider/Extender: [name] in Treatment: 8 Diagnosis Coding ICD-10 Codes Code Description I87.2 Venous insufficiency (chronic) (peripheral) S81.802A Unspecified open wound, left lower leg, initial encounter L97.822 Non-pressure chronic ulcer of other part of left lower leg with fat layer exposed [place] (primary) hypertension Facility Procedures CPT4 Code: [id number] Description: 99213 - WOUND CARE VISIT-LEV 3 EST PT Modifier: Quantity: 1 Physician Procedures CPT4 Code: 1655374 Description: 82707 - WC PHYS LEVEL 3 - EST PT Modifier: Quantity: 1 CPT4 Code: Description: ICD-10 Diagnosis Description I87.2 Venous insufficiency (chronic) (peripheral) S81.802A Unspecified open wound, left lower leg, initial encounter L97.822 Non-pressure chronic ulcer of other part of left  lower leg with fat lay [place]  (primary) hypertension Modifier: er exposed Quantity: Electronic Signature(s) Signed: 08/28/2020 2:01:36 PM By: [name] Entered By: Worthy Keeler on 08/28/2020 14:01:36

## 2020-08-28 NOTE — Progress Notes (Addendum)
SONDI, DESCH ([id number]) Visit Report for 08/28/2020 Arrival Information Details Patient Name: [name], [name]. Date of Service: 08/28/2020 1:30 PM Medical Record Number: [id number] Patient Account Number: [id number] Date of Birth/Sex: 05-20-1946 (75 y.o. F) Treating RN: [name] Primary Care Aspen Deterding: [name] Other Clinician: Referring Kanai Hilger: [name] Treating Shawni Volkov/Extender: [name] in Treatment: 8 Visit Information History Since Last Visit Pain Present Now: No Patient Arrived: Ambulatory Arrival Time: 13:35 Accompanied By: self Transfer Assistance: None Patient Identification Verified: Yes Secondary Verification Process Completed: Yes Electronic Signature(s) Signed: 08/28/2020 4:31:04 PM By: [name], [name] Entered By: [name], Minus Breeding on 08/28/2020 13:37:40 Rivkin, Meekah CMarland Kitchen ([id number]) -------------------------------------------------------------------------------- Clinic Level of Care Assessment Details Patient Name: [name], [name] Date of Service: 08/28/2020 1:30 PM Medical Record Number: [id number] Patient Account Number: [id number] Date of Birth/Sex: Dec 30, 1945 (75 y.o. F) Treating RN: Carlene Coria Primary Care Javarian Jakubiak: [name] Other Clinician: Referring Elvan Ebron: [name] Treating Isabel Ardila/Extender: [name] in Treatment: 8 Clinic Level of Care Assessment Items TOOL 4 Quantity Score X - Use when only an EandM is performed on FOLLOW-UP visit 1 0 ASSESSMENTS - Nursing Assessment / Reassessment X - Reassessment of Co-morbidities (includes updates in patient status) 1 10 X- 1 5 Reassessment of Adherence to Treatment Plan ASSESSMENTS - Wound and Skin Assessment / Reassessment X - Simple Wound Assessment / Reassessment - one wound 1 5 []  - 0 Complex Wound Assessment / Reassessment - multiple wounds []  - 0 Dermatologic / Skin Assessment (not related to wound area) ASSESSMENTS - Focused  Assessment []  - Circumferential Edema Measurements - multi extremities 0 []  - 0 Nutritional Assessment / Counseling / Intervention []  - 0 Lower Extremity Assessment (monofilament, tuning fork, pulses) []  - 0 Peripheral Arterial Disease Assessment (using hand held doppler) ASSESSMENTS - Ostomy and/or Continence Assessment and Care []  - Incontinence Assessment and Management 0 []  - 0 Ostomy Care Assessment and Management (repouching, etc.) PROCESS - Coordination of Care X - Simple Patient / Family Education for ongoing care 1 15 []  - 0 Complex (extensive) Patient / Family Education for ongoing care X- 1 10 Staff obtains Programmer, systems, Records, Test Results / Process Orders []  - 0 Staff telephones HHA, Nursing Homes / Clarify orders / etc []  - 0 Routine Transfer to another Facility (non-emergent condition) []  - 0 Routine Hospital Admission (non-emergent condition) []  - 0 New Admissions / Biomedical engineer / Ordering NPWT, Apligraf, etc. []  - 0 Emergency Hospital Admission (emergent condition) X- 1 10 Simple Discharge Coordination []  - 0 Complex (extensive) Discharge Coordination PROCESS - Special Needs []  - Pediatric / Minor Patient Management 0 []  - 0 Isolation Patient Management []  - 0 Hearing / Language / Visual special needs []  - 0 Assessment of Community assistance (transportation, D/C planning, etc.) []  - 0 Additional assistance / Altered mentation []  - 0 Support Surface(s) Assessment (bed, cushion, seat, etc.) INTERVENTIONS - Wound Cleansing / Measurement Gouin, Staley C. ([id number]) X- 1 5 Simple Wound Cleansing - one wound []  - 0 Complex Wound Cleansing - multiple wounds []  - 0 Wound Imaging (photographs - any number of wounds) []  - 0 Wound Tracing (instead of photographs) X- 1 5 Simple Wound Measurement - one wound []  - 0 Complex Wound Measurement - multiple wounds INTERVENTIONS - Wound Dressings X - Small Wound Dressing one or multiple wounds 1  10 []  - 0 Medium Wound Dressing one or multiple wounds []  - 0 Large Wound Dressing one or multiple wounds []  - 0  Application of Medications - topical []  - 0 Application of Medications - injection INTERVENTIONS - Miscellaneous []  - External ear exam 0 []  - 0 Specimen Collection (cultures, biopsies, blood, body fluids, etc.) []  - 0 Specimen(s) / Culture(s) sent or taken to Lab for analysis []  - 0 Patient Transfer (multiple staff / Civil Service fast streamer / Similar devices) []  - 0 Simple Staple / Suture removal (25 or less) []  - 0 Complex Staple / Suture removal (26 or more) []  - 0 Hypo / Hyperglycemic Management (close monitor of Blood Glucose) []  - 0 Ankle / Brachial Index (ABI) - do not check if billed separately X- 1 5 Vital Signs Has the patient been seen at the hospital within the last three years: Yes Total Score: 80 Level Of Care: New/Established - Level 3 Electronic Signature(s) Signed: 09/02/2020 9:44:48 AM By: Carlene Coria RN Entered By: Carlene Coria on 08/28/2020 13:53:41 Crandell, Shaterrica CMarland Kitchen ([id number]) -------------------------------------------------------------------------------- Encounter Discharge Information Details Patient Name: Cindy Loop, [name] Date of Service: 08/28/2020 1:30 PM Medical Record Number: [id number] Patient Account Number: [id number] Date of Birth/Sex: Dec 23, 1945 (75 y.o. F) Treating RN: Carlene Coria Primary Care Jossue Rubenstein: [name] Other Clinician: Referring Hasel Janish: [name] Treating Arlen Legendre/Extender: [name] in Treatment: 8 Encounter Discharge Information Items Discharge Condition: Stable Ambulatory Status: Ambulatory Discharge Destination: Home Transportation: Private Auto Accompanied By: self Schedule Follow-up Appointment: Yes Clinical Summary of Care: Patient Declined Electronic Signature(s) Signed: 09/02/2020 9:44:48 AM By: Carlene Coria RN Entered By: Carlene Coria on 08/28/2020 14:01:37 Statzer, Shamariah C.  ([id number]) -------------------------------------------------------------------------------- Lower Extremity Assessment Details Patient Name: Estorga, Jeily C. Date of Service: 08/28/2020 1:30 PM Medical Record Number: [id number] Patient Account Number: [id number] Date of Birth/Sex: July 03, 1946 (75 y.o. F) Treating RN: [name] Primary Care Rigby Swamy: [name] Other Clinician: Referring Alyssa Mancera: [name] Treating Sofie Schendel/Extender: [name] in Treatment: 8 Edema Assessment Assessed: [Left: Yes] [Right: No] Edema: [Left: N] [Right: o] Calf Left: Right: Point of Measurement: 32 cm From Medial Instep 36 cm Ankle Left: Right: Point of Measurement: 10 cm From Medial Instep 22.5 cm Vascular Assessment Pulses: Dorsalis Pedis Palpable: [Left:Yes] Electronic Signature(s) Signed: 08/28/2020 4:31:04 PM By: [name], [name] Entered By: [name], Kenia on 08/28/2020 13:46:39 Mayorga, Jayliah C. ([id number]) -------------------------------------------------------------------------------- Multi Wound Chart Details Patient Name: Cindy Loop, Nailah C. Date of Service: 08/28/2020 1:30 PM Medical Record Number: [id number] Patient Account Number: [id number] Date of Birth/Sex: 1946/06/30 (75 y.o. F) Treating RN: Carlene Coria Primary Care Kim Lauver: [name] Other Clinician: Referring Raijon Lindfors: [name] Treating Taven Strite/Extender: [name] in Treatment: 8 Vital Signs Height(in): 61 Pulse(bpm): 79 Weight(lbs): 175 Blood Pressure(mmHg): 130/70 Body Mass Index(BMI): 33 Temperature(F): 98.8 Respiratory Rate(breaths/min): 18 Photos: [N/A:N/A] Wound Location: Left, Anterior Lower Leg N/A N/A Wounding Event: Puncture N/A N/A Primary Etiology: Skin Tear N/A N/A Comorbid History: Cataracts, Asthma, Hypertension, N/A N/A Osteoarthritis, Received Radiation Date Acquired: 05/18/2020 N/A N/A Weeks of Treatment: 8 N/A N/A Wound Status: Open N/A  N/A Measurements L x W x D (cm) 0.7x0.5x0.4 N/A N/A Area (cm) : 0.275 N/A N/A Volume (cm) : 0.11 N/A N/A % Reduction in Area: 82.00% N/A N/A % Reduction in Volume: 28.10% N/A N/A Position 1 (o'clock): 11 Maximum Distance 1 (cm): 0.6 Tunneling: Yes N/A N/A Classification: Full Thickness Without Exposed N/A N/A Support Structures Exudate Amount: Medium N/A N/A Exudate Type: Serous N/A N/A Exudate Color: amber N/A N/A Wound Margin: Flat and Intact N/A N/A Granulation Amount: Large (67-100%) N/A N/A Granulation Quality: Pink, Pale N/A N/A Necrotic  Amount: Small (1-33%) N/A N/A Exposed Structures: Fat Layer (Subcutaneous Tissue): N/A N/A Yes Fascia: No Tendon: No Muscle: No Joint: No Bone: No Epithelialization: None N/A N/A Treatment Notes Electronic Signature(s) Signed: 09/02/2020 9:44:48 AM By: Carlene Coria RN Entered By: Carlene Coria on 08/28/2020 13:52:44 Faison, SHENIQUE CHILDERS ([id number]) MARTHE, DANT ([id number]) -------------------------------------------------------------------------------- [place] Details Patient Name: Cindy Loop, Tyshauna C. Date of Service: 08/28/2020 1:30 PM Medical Record Number: [id number] Patient Account Number: [id number] Date of Birth/Sex: [birth year] (75 y.o. F) Treating RN: Carlene Coria Primary Care Sahvanna Mcmanigal: [name] Other Clinician: Referring Corky Blumstein: [name] Treating Calahan Pak/Extender: [name] in Treatment: 8 Active Inactive Wound/Skin Impairment Nursing Diagnoses: Impaired tissue integrity Goals: Ulcer/skin breakdown will have a volume reduction of 30% by week 4 Date Initiated: 07/03/2020 Date Inactivated: 08/28/2020 Target Resolution Date: 07/31/2020 Goal Status: Unmet Unmet Reason: comorbities Ulcer/skin breakdown will have a volume reduction of 50% by week 8 Date Initiated: 08/28/2020 Target Resolution Date: 09/25/2020 Goal Status: Active Interventions: Assess ulceration(s) every  visit Notes: Electronic Signature(s) Signed: 09/02/2020 9:44:48 AM By: Carlene Coria RN Entered By: Carlene Coria on 08/28/2020 13:52:30 Balint, [place] C. ([id number]) -------------------------------------------------------------------------------- Pain Assessment Details Patient Name: Mione, Deseray C. Date of Service: 08/28/2020 1:30 PM Medical Record Number: [id number] Patient Account Number: [id number] Date of Birth/Sex: April 08, 1946 (75 y.o. F) Treating RN: [name] Primary Care Seferina Brokaw: [name] Other Clinician: Referring Barbarann Kelly: [name] Treating Ariyan Brisendine/Extender: [name] in Treatment: 8 Active Problems Location of Pain Severity and Description of Pain Patient Has Paino No Site Locations Rate the pain. Current Pain Level: 0 Pain Management and Medication Current Pain Management: Electronic Signature(s) Signed: 08/28/2020 4:31:04 PM By: [name], [name] Entered By: [name], Kenia on 08/28/2020 13:39:19 Graefe, [name] ([id number]) -------------------------------------------------------------------------------- Patient/Caregiver Education Details Patient Name: [name] Date of Service: 08/28/2020 1:30 PM Medical Record Number: [id number] Patient Account Number: [id number] Date of Birth/Gender: May 22, 1946 (75 y.o. F) Treating RN: Carlene Coria Primary Care Physician: [name] Other Clinician: Referring Physician: [name] Treating Physician/Extender: [name] in Treatment: 8 Education Assessment Education Provided To: Patient Education Topics Provided Wound/Skin Impairment: Methods: Explain/Verbal Responses: State content correctly Electronic Signature(s) Signed: 09/02/2020 9:44:48 AM By: Carlene Coria RN Entered By: Carlene Coria on 08/28/2020 13:53:58 Rigor, Ajanay C. ([id number]) -------------------------------------------------------------------------------- Wound Assessment  Details Patient Name: Holz, Siana C. Date of Service: 08/28/2020 1:30 PM Medical Record Number: [id number] Patient Account Number: [id number] Date of Birth/Sex: 10/15/1945 (75 y.o. F) Treating RN: [name] Primary Care Linn Clavin: [name] Other Clinician: Referring Tasharra Nodine: [name] Treating Svetlana Bagby/Extender: [name] in Treatment: 8 Wound Status Wound Number: 1 Primary Skin Tear Etiology: Wound Location: Left, Anterior Lower Leg Wound Status: Open Wounding Event: Puncture Comorbid Cataracts, Asthma, Hypertension, Osteoarthritis, Date Acquired: 05/18/2020 History: Received Radiation Weeks Of Treatment: 8 Clustered Wound: No Photos Wound Measurements Length: (cm) 0.7 Width: (cm) 0.5 Depth: (cm) 0.4 Area: (cm) 0.275 Volume: (cm) 0.11 % Reduction in Area: 82% % Reduction in Volume: 28.1% Epithelialization: None Tunneling: Yes Position (o'clock): 11 Maximum Distance: (cm) 0.6 Undermining: No Wound Description Classification: Full Thickness Without Exposed Support Structu Wound Margin: Flat and Intact Exudate Amount: Medium Exudate Type: Serous Exudate Color: amber res Foul Odor After Cleansing: No Slough/Fibrino Yes Wound Bed Granulation Amount: Large (67-100%) Exposed Structure Granulation Quality: Pink, Pale Fascia Exposed: No Necrotic Amount: Small (1-33%) Fat Layer (Subcutaneous Tissue) Exposed: Yes Necrotic Quality: Adherent Slough Tendon Exposed: No Muscle Exposed: No Joint Exposed: No Bone Exposed: No  Treatment Notes Wound #1 (Lower Leg) Wound Laterality: Left, Anterior Cleanser Bassin, Luise C. ([id number]) Peri-Wound Care Topical Gentamicin Discharge Instruction: Apply as directed by Kiyla Ringler. Primary Dressing Endoform 2x2 (in/in) Discharge Instruction: Apply Endoform as directed Secondary Dressing Non-Adherent Pad 3x8 (in/in) Secured With Curad Paper Tape, 1x10 (in/yd) Compression Wrap Compression  Stockings Add-Ons Electronic Signature(s) Signed: 08/28/2020 4:31:04 PM By: [name], [name] Entered By: [name], Kenia on 08/28/2020 13:44:36 Cuadrado, Jasimine C. ([id number]) -------------------------------------------------------------------------------- Vitals Details Patient Name: Cindy Loop, Tashaya C. Date of Service: 08/28/2020 1:30 PM Medical Record Number: [id number] Patient Account Number: [id number] Date of Birth/Sex: Feb 23, 1946 (75 y.o. F) Treating RN: [name] Primary Care Zyrion Coey: [name] Other Clinician: Referring Kora Groom: [name] Treating Samamtha Tiegs/Extender: [name] in Treatment: 8 Vital Signs Time Taken: 13:37 Temperature (F): 98.8 Height (in): 61 Pulse (bpm): 78 Weight (lbs): 175 Respiratory Rate (breaths/min): 18 Body Mass Index (BMI): 33.1 Blood Pressure (mmHg): 130/70 Reference Range: 80 - 120 mg / dl Electronic Signature(s) Signed: 08/28/2020 4:31:04 PM By: [name], [name] Entered By: [name], Minus Breeding on 08/28/2020 13:39:02

## 2020-09-01 ENCOUNTER — Encounter: Payer: Self-pay | Admitting: Internal Medicine

## 2020-09-01 ENCOUNTER — Other Ambulatory Visit: Payer: Self-pay | Admitting: *Deleted

## 2020-09-01 ENCOUNTER — Other Ambulatory Visit: Payer: Self-pay

## 2020-09-01 ENCOUNTER — Ambulatory Visit (INDEPENDENT_AMBULATORY_CARE_PROVIDER_SITE_OTHER): Payer: Medicare Other | Admitting: Internal Medicine

## 2020-09-01 VITALS — BP 126/78 | HR 82 | Ht 61.0 in | Wt 173.0 lb

## 2020-09-01 DIAGNOSIS — I10 Essential (primary) hypertension: Secondary | ICD-10-CM

## 2020-09-01 DIAGNOSIS — L97221 Non-pressure chronic ulcer of left calf limited to breakdown of skin: Secondary | ICD-10-CM | POA: Diagnosis not present

## 2020-09-01 DIAGNOSIS — I872 Venous insufficiency (chronic) (peripheral): Secondary | ICD-10-CM | POA: Diagnosis not present

## 2020-09-01 DIAGNOSIS — R8281 Pyuria: Secondary | ICD-10-CM | POA: Diagnosis not present

## 2020-09-01 DIAGNOSIS — J302 Other seasonal allergic rhinitis: Secondary | ICD-10-CM

## 2020-09-01 DIAGNOSIS — J3089 Other allergic rhinitis: Secondary | ICD-10-CM | POA: Diagnosis not present

## 2020-09-01 DIAGNOSIS — I83022 Varicose veins of left lower extremity with ulcer of calf: Secondary | ICD-10-CM | POA: Diagnosis not present

## 2020-09-01 DIAGNOSIS — J301 Allergic rhinitis due to pollen: Secondary | ICD-10-CM | POA: Diagnosis not present

## 2020-09-01 LAB — POCT URINALYSIS DIPSTICK
Appearance: NORMAL
Bilirubin, UA: NEGATIVE
Blood, UA: NEGATIVE
Glucose, UA: NEGATIVE
Ketones, UA: NEGATIVE
Leukocytes, UA: NEGATIVE
Nitrite, UA: NEGATIVE
Protein, UA: NEGATIVE
Spec Grav, UA: 1.015 (ref 1.010–1.025)
Urobilinogen, UA: NEGATIVE E.U./dL — AB
pH, UA: 6 (ref 5.0–8.0)

## 2020-09-01 MED ORDER — FUROSEMIDE 20 MG PO TABS: 20.0000 mg | ORAL_TABLET | Freq: Every day | ORAL | 3 refills | Status: DC

## 2020-09-01 MED ORDER — FLUOXETINE HCL 40 MG PO CAPS: 40.0000 mg | ORAL_CAPSULE | ORAL | 0 refills | Status: DC

## 2020-09-01 NOTE — Assessment & Plan Note (Signed)
Blood pressure stable on the present therapeutic regimen. 

## 2020-09-01 NOTE — Assessment & Plan Note (Signed)
Allergic rhinitis is stable on Claritin.

## 2020-09-01 NOTE — Progress Notes (Signed)
Established Patient Office Visit  Subjective:  Patient ID: [name], female    DOB: 31-Jan-1946  Age: 75 y.o. MRN: [id number]  CC:  Chief Complaint  Patient presents with  . Follow-up    Patient here today for 1 month follow up     HPI  [name] presents for For polyuria and chil,.  Patient has been seen by the wound clinic for the left leg also.  Also has been seen by vascular specialist.  Past Medical History:  Diagnosis Date  . Asthma    WELL CONTROLLED  . Bulging lumbar disc   . Cancer [facility]) 1999   thyroid with recurrent 2006  . Complication of anesthesia    HEART STOPPED DURING TONSILLECTOMY (AGE 35) DUE TO ALLERGY TO ETHER  . DDD (degenerative disc disease), lumbar   . DDD (degenerative disc disease), thoracolumbar   . Diverticulosis   . Family history of adverse reaction to anesthesia    PTS FATHER-UNSURE WHAT HAPPENED  . GERD (gastroesophageal reflux disease)   . Heart murmur   . Hepatitis    AGE 67 "VIRAL"  . Hypertension    OFF MEDS CURRENTLY PER PCP ([name])  . Hypothyroidism   . Interstitial cystitis   . Kidney disease   . PONV (postoperative nausea and vomiting)   . Thyroid disease     Past Surgical History:  Procedure Laterality Date  . BLADDER SURGERY  1984  . BREAST SURGERY  [id number]   mass removed  . BREAST SURGERY  April 2017   Dr Jack Quarto [facility]  . CATARACT EXTRACTION  2011  . CHOLECYSTECTOMY N/A 11/30/2015   Procedure: LAPAROSCOPIC CHOLECYSTECTOMY WITH INTRAOPERATIVE CHOLANGIOGRAM;  Surgeon: Christene Lye, MD;  Location: [facility] ORS;  Service: General;  Laterality: N/A;  . COLONOSCOPY  2005, 2015  . DILATION AND CURETTAGE OF UTERUS  1975  . EXCISIONAL HEMORRHOIDECTOMY  1975  . EYE SURGERY Left 2011  . [place]  . HERNIA REPAIR  2015  . THYROIDECTOMY  1999  . TONSILLECTOMY  1952   AGE 35    Family History  Problem Relation Age of Onset  . Heart disease Mother   . Cancer Father        colon  .  Heart disease Brother   . Diabetes Maternal Aunt   . Asthma Paternal Aunt   . Bladder Cancer Maternal Aunt   . Ovarian cancer Neg Hx   . Kidney cancer Neg Hx   . Prostate cancer Neg Hx     Social History   Socioeconomic History  . Marital status: Married    Spouse name: Not on file  . Number of children: Not on file  . Years of education: Not on file  . Highest education level: Not on file  Occupational History  . Not on file  Tobacco Use  . Smoking status: Former Smoker    Packs/day: 1.00    Years: 15.00    Pack years: 15.00    Quit date: 11/25/1985    Years since quitting: 34.7  . Smokeless tobacco: Never Used  Vaping Use  . Vaping Use: Never used  Substance and Sexual Activity  . Alcohol use: No  . Drug use: No  . Sexual activity: Never  Other Topics Concern  . Not on file  Social History Narrative  . Not on file   Social Determinants of Health   Financial Resource Strain: Not on file  Food Insecurity: Not on file  Transportation Needs: Not on file  Physical Activity: Not on file  Stress: Not on file  Social Connections: Not on file  Intimate Partner Violence: Not on file     Current Outpatient Medications:  .  albuterol (PROVENTIL) (2.5 MG/3ML) 0.083% nebulizer solution, Inhale into the lungs., Disp: , Rfl:  .  ALBUTEROL IN, Inhale into the lungs as needed., Disp: , Rfl:  .  aspirin 81 MG tablet, Take 81 mg by mouth daily., Disp: , Rfl:  .  Calcium 500 MG CHEW, Chew by mouth., Disp: , Rfl:  .  cannabidiol (EPIDIOLEX) 100 MG/ML solution, Take by mouth., Disp: , Rfl:  .  cetirizine (ZYRTEC) 10 MG tablet, Take 10 mg by mouth daily., Disp: , Rfl:  .  Cholecalciferol (VITAMIN D-3) 1000 units CAPS, Take by mouth daily., Disp: , Rfl:  .  doxycycline (VIBRA-TABS) 100 MG tablet, Take 1 tablet (100 mg total) by mouth 2 (two) times daily., Disp: 20 tablet, Rfl: 0 .  EPINEPHrine 0.3 mg/0.3 mL IJ SOAJ injection, See admin instructions., Disp: , Rfl:  .  fluconazole  (DIFLUCAN) 100 MG tablet, TAKE 1 TABLET BY MOUTH DAILY FOR 4 DAYS, Disp: 4 tablet, Rfl: 0 .  FLUoxetine (PROZAC) 40 MG capsule, Take 1 capsule (40 mg total) by mouth every morning., Disp: 90 capsule, Rfl: 0 .  fluticasone (FLONASE) 50 MCG/ACT nasal spray, Place 1 spray into both nostrils daily., Disp: , Rfl:  .  Fluticasone-Salmeterol (ADVAIR) 250-50 MCG/DOSE AEPB, Inhale 1 puff into the lungs 3 (three) times daily as needed. , Disp: , Rfl:  .  gabapentin (NEURONTIN) 100 MG capsule, , Disp: , Rfl:  .  gentamicin cream (GARAMYCIN) 0.1 %, SMARTSIG:Sparingly Topical 3 Times a Week, Disp: , Rfl:  .  HYDROcodone-acetaminophen (NORCO) 7.5-325 MG tablet, Take by mouth., Disp: , Rfl:  .  hydrOXYzine (ATARAX/VISTARIL) 25 MG tablet, 1 tablet, Disp: , Rfl:  .  levofloxacin (LEVAQUIN) 250 MG tablet, Take 1 tablet (250 mg total) by mouth daily., Disp: 7 tablet, Rfl: 0 .  levothyroxine (SYNTHROID) 150 MCG tablet, TAKE 1 TABLET BY MOUTH DAILY, Disp: 90 tablet, Rfl: 3 .  olmesartan (BENICAR) 20 MG tablet, Take 20 mg by mouth as needed (IF BP IS ELEVATED)., Disp: , Rfl:  .  Omega-3 Fatty Acids (FISH OIL) 1000 MG CAPS, Take by mouth., Disp: , Rfl:  .  pantoprazole (PROTONIX) 40 MG tablet, Take 1 tablet by mouth every morning. , Disp: , Rfl:  .  potassium chloride SA (KLOR-CON) 20 MEQ tablet, TAKE ONE TABLET BY MOUTH EVERY DAY, Disp: 90 tablet, Rfl: 1 .  predniSONE (DELTASONE) 2.5 MG tablet, TAKE ONE TABLET EVERY DAY, Disp: 30 tablet, Rfl: 6 .  pregabalin (LYRICA) 50 MG capsule, Take by mouth., Disp: , Rfl:  .  PRESCRIPTION MEDICATION, Allergy injections once weekly, Disp: , Rfl:  .  raloxifene (EVISTA) 60 MG tablet, , Disp: , Rfl: 10 .  tiZANidine (ZANAFLEX) 4 MG capsule, Take 4 mg by mouth as needed. , Disp: , Rfl:  .  vitamin C (ASCORBIC ACID) 500 MG tablet, Take by mouth., Disp: , Rfl:  .  zolpidem (AMBIEN) 10 MG tablet, Take 1 tablet (10 mg total) by mouth at bedtime., Disp: 90 tablet, Rfl: 1 .  furosemide  (LASIX) 20 MG tablet, Take 1 tablet (20 mg total) by mouth daily., Disp: 90 tablet, Rfl: 3   Allergies  Allergen Reactions  . Pollen Extract Other (See Comments)  . Erythromycin Other (See Comments)  .  Ether     "HEART STOPPED WHILE IN SURGERY"  . Gramineae Pollens Other (See Comments)  . Montelukast Sodium Other (See Comments)  . Penicillins Hives  . Tamsulosin     Other reaction(s): Other (See Comments) "made me loopy"  . Band-Aid Plus Antibiotic [Bacitracin-Polymyxin B] Rash    ROS Review of Systems  Constitutional: Negative.   HENT: Negative.   Eyes: Negative.   Respiratory: Negative.   Cardiovascular: Negative.   Gastrointestinal: Negative.   Endocrine: Negative.   Genitourinary: Negative.   Musculoskeletal: Negative.   Skin: Negative.   Allergic/Immunologic: Negative.   Neurological: Negative.   Hematological: Negative.   Psychiatric/Behavioral: Negative.   All other systems reviewed and are negative.     Objective:    Physical Exam Vitals reviewed.  Constitutional:      Appearance: Normal appearance.  HENT:     Mouth/Throat:     Mouth: Mucous membranes are moist.  Eyes:     Pupils: Pupils are equal, round, and reactive to light.  Neck:     Vascular: No carotid bruit.  Cardiovascular:     Rate and Rhythm: Normal rate and regular rhythm.     Pulses: Normal pulses.     Heart sounds: Normal heart sounds.  Pulmonary:     Effort: Pulmonary effort is normal.     Breath sounds: Normal breath sounds.  Abdominal:     General: Bowel sounds are normal.     Palpations: Abdomen is soft. There is no hepatomegaly, splenomegaly or mass.     Tenderness: There is no abdominal tenderness.     Hernia: No hernia is present.  Musculoskeletal:        General: No tenderness.     Cervical back: Neck supple.     Right lower leg: No edema.     Left lower leg: No edema.  Skin:    Findings: No rash.  Neurological:     Mental Status: She is alert and oriented to person,  place, and time.     Motor: No weakness.  Psychiatric:        Mood and Affect: Mood and affect normal.        Behavior: Behavior normal.     BP 126/78   Pulse 82   Ht 5\' 1"  (1.549 m)   Wt 173 lb (78.5 kg)   BMI 32.69 kg/m  Wt Readings from Last 3 Encounters:  09/01/20 173 lb (78.5 kg)  08/27/20 172 lb (78 kg)  08/14/20 173 lb (78.5 kg)     Health Maintenance Due  Topic Date Due  . Hepatitis C Screening  Never done  . TETANUS/TDAP  Never done  . DEXA SCAN  Never done  . COVID-19 Vaccine (2 - Pfizer risk 4-dose series) 08/27/2020    There are no preventive care reminders to display for this patient.  Lab Results  Component Value Date   TSH 1.37 06/09/2020   Lab Results  Component Value Date   WBC 6.6 06/09/2020   HGB 12.3 06/09/2020   HCT 36.7 06/09/2020   MCV 88.9 06/09/2020   PLT 319 06/09/2020   Lab Results  Component Value Date   NA 139 06/09/2020   K 4.7 06/09/2020   CO2 23 06/09/2020   GLUCOSE 84 06/09/2020   BUN 22 06/09/2020   CREATININE 1.51 (H) 06/09/2020   BILITOT 0.5 06/09/2020   ALKPHOS 66 07/08/2014   AST 19 06/09/2020   ALT 19 06/09/2020   PROT 6.3 06/09/2020  ALBUMIN 4.1 07/08/2014   CALCIUM 10.8 (H) 06/09/2020   ANIONGAP 9 03/06/2017   Lab Results  Component Value Date   CHOL 193 06/09/2020   Lab Results  Component Value Date   HDL 89 06/09/2020   Lab Results  Component Value Date   LDLCALC 86 06/09/2020   Lab Results  Component Value Date   TRIG 90 06/09/2020   Lab Results  Component Value Date   CHOLHDL 2.2 06/09/2020   No results found for: HGBA1C    Assessment & Plan:   Problem List Items Addressed This Visit      Cardiovascular and Mediastinum   Essential hypertension    Blood pressure stable on the present therapeutic regimen.      Relevant Medications   furosemide (LASIX) 20 MG tablet   Chronic venous insufficiency    Referred to Dr. Lucky Cowboy for venous for surgery      Relevant Medications    furosemide (LASIX) 20 MG tablet   Varicose veins of lower extremities with ulcer ([place])    Patient has an wound which is nonhealing for some time it is getting better after she started seeing the wound clinic.      Relevant Medications   furosemide (LASIX) 20 MG tablet     Respiratory   Allergic rhinitis    Allergic rhinitis is stable on Claritin.        Other   Pyuria - Primary   Relevant Orders   POCT urinalysis dipstick      Meds ordered this encounter  Medications  . furosemide (LASIX) 20 MG tablet    Sig: Take 1 tablet (20 mg total) by mouth daily.    Dispense:  90 tablet    Refill:  3    Follow-up: No follow-ups on file.    [name], MD

## 2020-09-01 NOTE — Assessment & Plan Note (Signed)
Referred to Dr. Lucky Cowboy for venous for surgery

## 2020-09-01 NOTE — Assessment & Plan Note (Signed)
Patient has an wound which is nonhealing for some time it is getting better after she started seeing the wound clinic.

## 2020-09-02 LAB — CBC WITH DIFFERENTIAL/PLATELET
Absolute Monocytes: 791 cells/uL (ref 200–950)
Basophils Absolute: 68 cells/uL (ref 0–200)
Basophils Relative: 0.8 %
Eosinophils Absolute: 51 cells/uL (ref 15–500)
Eosinophils Relative: 0.6 %
HCT: 37.9 % (ref 35.0–45.0)
Hemoglobin: 12.9 g/dL (ref 11.7–15.5)
Lymphs Abs: 1012 cells/uL (ref 850–3900)
MCH: 30.7 pg (ref 27.0–33.0)
MCHC: 34 g/dL (ref 32.0–36.0)
MCV: 90.2 fL (ref 80.0–100.0)
MPV: 9.7 fL (ref 7.5–12.5)
Monocytes Relative: 9.3 %
Neutro Abs: 6579 cells/uL (ref 1500–7800)
Neutrophils Relative %: 77.4 %
Platelets: 322 10*3/uL (ref 140–400)
RBC: 4.2 10*6/uL (ref 3.80–5.10)
RDW: 13.3 % (ref 11.0–15.0)
Total Lymphocyte: 11.9 %
WBC: 8.5 10*3/uL (ref 3.8–10.8)

## 2020-09-02 LAB — COMPLETE METABOLIC PANEL WITH GFR
AG Ratio: 2.1 (calc) (ref 1.0–2.5)
ALT: 20 U/L (ref 6–29)
AST: 17 U/L (ref 10–35)
Albumin: 4.5 g/dL (ref 3.6–5.1)
Alkaline phosphatase (APISO): 62 U/L (ref 37–153)
BUN/Creatinine Ratio: 23 (calc) — ABNORMAL HIGH (ref 6–22)
BUN: 26 mg/dL — ABNORMAL HIGH (ref 7–25)
CO2: 26 mmol/L (ref 20–32)
Calcium: 9.9 mg/dL (ref 8.6–10.4)
Chloride: 101 mmol/L (ref 98–110)
Creat: 1.14 mg/dL — ABNORMAL HIGH (ref 0.60–0.93)
GFR, Est African American: 54 mL/min/{1.73_m2} — ABNORMAL LOW (ref >=60)
GFR, Est Non African American: 47 mL/min/{1.73_m2} — ABNORMAL LOW (ref >=60)
Globulin: 2.1 g/dL (calc) (ref 1.9–3.7)
Glucose, Bld: 79 mg/dL (ref 65–99)
Potassium: 4.5 mmol/L (ref 3.5–5.3)
Sodium: 136 mmol/L (ref 135–146)
Total Bilirubin: 0.6 mg/dL (ref 0.2–1.2)
Total Protein: 6.6 g/dL (ref 6.1–8.1)

## 2020-09-03 ENCOUNTER — Other Ambulatory Visit: Payer: Self-pay | Admitting: *Deleted

## 2020-09-04 ENCOUNTER — Encounter: Payer: Medicare Other | Admitting: Physician Assistant

## 2020-09-04 ENCOUNTER — Other Ambulatory Visit: Payer: Self-pay

## 2020-09-04 DIAGNOSIS — I872 Venous insufficiency (chronic) (peripheral): Secondary | ICD-10-CM | POA: Diagnosis not present

## 2020-09-04 DIAGNOSIS — L97822 Non-pressure chronic ulcer of other part of left lower leg with fat layer exposed: Secondary | ICD-10-CM | POA: Diagnosis not present

## 2020-09-04 NOTE — Progress Notes (Addendum)
[name] ([id number]) Visit Report for 09/04/2020 Chief Complaint Document Details Patient Name: [name], [name]. Date of Service: 09/04/2020 1:30 PM Medical Record Number: [id number] Patient Account Number: [id number] Date of Birth/Sex: May 20, 1946 (74 y.o. F) Treating RN: Carlene Coria Primary Care Provider: [name] Other Clinician: Referring Provider: [name] Treating Provider/Extender: [name] in Treatment: 9 Information Obtained from: Patient Chief Complaint Left LE Ulcer Electronic Signature(s) Signed: 09/04/2020 1:40:38 PM By: [name] Entered By: Worthy Keeler on 09/04/2020 13:40:38 Corkery, Takira CMarland Kitchen ([id number]) -------------------------------------------------------------------------------- HPI Details Patient Name: Cindy Ohm C. Date of Service: 09/04/2020 1:30 PM Medical Record Number: [id number] Patient Account Number: [id number] Date of Birth/Sex: 10-May-1946 (74 y.o. F) Treating RN: Carlene Coria Primary Care Provider: [name] Other Clinician: Referring Provider: [name] Treating Provider/Extender: [name] in Treatment: 9 History of Present Illness HPI Description: 07/03/2020 upon evaluation today patient appears to be doing unfortunately somewhat poorly in regard to her leg where she struck this causing a wound 05/18/2020. She states that since that time she has had mainly an eschar covering sometimes it will drain sometimes not its been infected a couple times as well and she has been on several antibiotics including 5 days left of doxycycline that she is currently taking. No sharp debridement has been undertaken by anyone at this point. She tells me that she does have a history of leg swelling she is supposed to wear compression socks but she does not always do this. She also tells me that she does have hypertension but otherwise no major medical problems. She has not really been doing anything  compression wise since this was injured in October. 07/10/2020 on evaluation today patient actually appears to be making some progress here. Fortunately there is no signs of active infection systemically locally she still has some evidence of erythema infection although this is minimal compared to what it was even last week. Overall I am pleased in that regard. There does not appear to be any signs of active infection spreading outside aware of marked that is good news as well. In general the patient seems to be doing well although I think we already continue to probably treat her we likely need to switch her to Bactrim DS as the doxycycline is resistant that is what she was taking as of last week. 07/17/2020 upon evaluation today patient appears to be doing excellent in regard to her leg ulcer. In fact I think we can probably switch to collagen-based dressing at this point as she seems to be doing so well. There is no evidence of infection at this time and I think that she is making good progress. I do want her to continue with the oral antibiotics at this point. 07/31/2020 upon evaluation today patient appears to be doing quite well in regard to her lower extremity ulcer. She has been tolerating the dressing changes without complication. Fortunately there is no evidence of active infection at this time. I been very pleased with where things stand currently. No fevers, chills, nausea, vomiting, or diarrhea. 08/07/2020 on evaluation today patient appears to continue to make signs of good improvement with regard to her wound. I am very pleased with where things stand this is measuring smaller and overall I think she is doing excellent. 08/14/2020 upon evaluation today patient appears to be doing well in regard to her leg ulcer. She has been tolerating the dressing changes without complication. Fortunately there is no signs of active infection at  this time which is also great news. No fevers, chills,  nausea, vomiting, or diarrhea. 08/21/2020 on evaluation today patient appears to be doing well with regard to her wound in general. With that being said she does have some issues currently with still a small area that had a little bit more depth roughly at the 11:00 location upon inspection. There is a little bit of undermining/tunneling here. With that being said I think we may want to try a little bit different dressing to see if this can be beneficial for her I am thinking endoform. 08/28/2020 upon evaluation today patient appears to be making a little bit of progress. This is measuring a little bit better I do believe the endoform has been beneficial for her. I did keep the small hole open and I do feel like that is showing some signs of improvement all of which is great news. 09/04/2020 upon evaluation today patient appears to be doing well with regard to her leg ulcer. She has been tolerating the dressing changes without complication. Fortunately there is no evidence of active infection at this time. No fevers, chills, nausea, vomiting, or diarrhea. Electronic Signature(s) Signed: 09/04/2020 2:33:03 PM By: [name] Entered By: Worthy Keeler on 09/04/2020 14:33:02 Vollman, [name] ([id number]) -------------------------------------------------------------------------------- Physical Exam Details Patient Name: [name], [name]. Date of Service: 09/04/2020 1:30 PM Medical Record Number: [id number] Patient Account Number: [id number] Date of Birth/Sex: 11-Jun-1946 (74 y.o. F) Treating RN: Carlene Coria Primary Care Provider: [name] Other Clinician: Referring Provider: [name] Treating Provider/Extender: [name] in Treatment: 9 Constitutional Well-nourished and well-hydrated in no acute distress. Respiratory normal breathing without difficulty. Psychiatric this patient is able to make decisions and demonstrates good insight into disease process. Alert and  Oriented x 3. pleasant and cooperative. Notes Upon inspection patient's wound again showed signs of excellent granulation she still has a very small area at roughly the 11:00 location where she has a little bit of depth in a circular pattern here this is extremely minimal and to be honest she is trying to pack some of the endoform down into there is much as she can. Nonetheless I do not see any signs of infection or worsening at this point. Electronic Signature(s) Signed: 09/04/2020 2:33:31 PM By: [name] Entered By: Worthy Keeler on 09/04/2020 14:33:30 Voit, [name] ([id number]) -------------------------------------------------------------------------------- Physician Orders Details Patient Name: Cindy Ohm C. Date of Service: 09/04/2020 1:30 PM Medical Record Number: [id number] Patient Account Number: [id number] Date of Birth/Sex: [birth year] (74 y.o. F) Treating RN: Carlene Coria Primary Care Provider: [name] Other Clinician: Referring Provider: [name] Treating Provider/Extender: [name] in Treatment: 9 Verbal / Phone Orders: No Diagnosis Coding ICD-10 Coding Code Description I87.2 Venous insufficiency (chronic) (peripheral) S81.802A Unspecified open wound, left lower leg, initial encounter L97.822 Non-pressure chronic ulcer of other part of left lower leg with fat layer exposed [place] (primary) hypertension Follow-up Appointments o Return Appointment in 1 week. Bathing/ Shower/ Hygiene o May shower; gently cleanse wound with antibacterial soap, rinse and pat dry prior to dressing wounds Edema Control - Lymphedema / Segmental Compressive Device / Other o Elevate, Exercise Daily and Avoid Standing for Long Periods of Time. Wound Treatment Wound #1 - Lower Leg Wound Laterality: Left, Anterior Topical: Gentamicin 3 x Per Week/30 Days Discharge Instructions: Apply as directed by provider. Primary Dressing: Endoform 2x2  (in/in) (Generic) 3 x Per Week/30 Days Discharge Instructions: Apply Endoform as directed Secondary Dressing:  Non-Adherent Pad 3x8 (in/in) (Generic) 3 x Per Week/30 Days Secured With: Curad Paper Tape, 1x10 (in/yd) (Generic) 3 x Per Week/30 Days Electronic Signature(s) Signed: 09/04/2020 4:44:17 PM By: [name] Signed: 09/07/2020 4:08:54 PM By: Carlene Coria RN Entered By: Carlene Coria on 09/04/2020 13:55:22 Daws, Mandee C. ([id number]) -------------------------------------------------------------------------------- Problem List Details Patient Name: [name], Vianka C. Date of Service: 09/04/2020 1:30 PM Medical Record Number: [id number] Patient Account Number: [id number] Date of Birth/Sex: 1945/08/16 (74 y.o. F) Treating RN: Carlene Coria Primary Care Provider: [name] Other Clinician: Referring Provider: [name] Treating Provider/Extender: [name] in Treatment: 9 Active Problems ICD-10 Encounter Code Description Active Date MDM Diagnosis I87.2 Venous insufficiency (chronic) (peripheral) 07/03/2020 No Yes S81.802A Unspecified open wound, left lower leg, initial encounter 07/03/2020 No Yes L97.822 Non-pressure chronic ulcer of other part of left lower leg with fat layer 07/03/2020 No Yes exposed [place] (primary) hypertension 07/03/2020 No Yes Inactive Problems Resolved Problems Electronic Signature(s) Signed: 09/04/2020 1:40:30 PM By: [name] Entered By: Worthy Keeler on 09/04/2020 13:40:29 Coachman, Eunie C. ([id number]) -------------------------------------------------------------------------------- Progress Note Details Patient Name: Rockhill, Graclyn C. Date of Service: 09/04/2020 1:30 PM Medical Record Number: [id number] Patient Account Number: [id number] Date of Birth/Sex: 08-22-1945 (74 y.o. F) Treating RN: Carlene Coria Primary Care Provider: [name] Other Clinician: Referring Provider: [name] Treating Provider/Extender: [name] in Treatment: 9 Subjective Chief Complaint Information obtained from Patient Left LE Ulcer History of Present Illness (HPI) 07/03/2020 upon evaluation today patient appears to be doing unfortunately somewhat poorly in regard to her leg where she struck this causing a wound 05/18/2020. She states that since that time she has had mainly an eschar covering sometimes it will drain sometimes not its been infected a couple times as well and she has been on several antibiotics including 5 days left of doxycycline that she is currently taking. No sharp debridement has been undertaken by anyone at this point. She tells me that she does have a history of leg swelling she is supposed to wear compression socks but she does not always do this. She also tells me that she does have hypertension but otherwise no major medical problems. She has not really been doing anything compression wise since this was injured in October. 07/10/2020 on evaluation today patient actually appears to be making some progress here. Fortunately there is no signs of active infection systemically locally she still has some evidence of erythema infection although this is minimal compared to what it was even last week. Overall I am pleased in that regard. There does not appear to be any signs of active infection spreading outside aware of marked that is good news as well. In general the patient seems to be doing well although I think we already continue to probably treat her we likely need to switch her to Bactrim DS as the doxycycline is resistant that is what she was taking as of last week. 07/17/2020 upon evaluation today patient appears to be doing excellent in regard to her leg ulcer. In fact I think we can probably switch to collagen-based dressing at this point as she seems to be doing so well. There is no evidence of infection at this time and I think that she is making good  progress. I do want her to continue with the oral antibiotics at this point. 07/31/2020 upon evaluation today patient appears to be doing quite well in regard to her lower extremity ulcer. She has been  tolerating the dressing changes without complication. Fortunately there is no evidence of active infection at this time. I been very pleased with where things stand currently. No fevers, chills, nausea, vomiting, or diarrhea. 08/07/2020 on evaluation today patient appears to continue to make signs of good improvement with regard to her wound. I am very pleased with where things stand this is measuring smaller and overall I think she is doing excellent. 08/14/2020 upon evaluation today patient appears to be doing well in regard to her leg ulcer. She has been tolerating the dressing changes without complication. Fortunately there is no signs of active infection at this time which is also great news. No fevers, chills, nausea, vomiting, or diarrhea. 08/21/2020 on evaluation today patient appears to be doing well with regard to her wound in general. With that being said she does have some issues currently with still a small area that had a little bit more depth roughly at the 11:00 location upon inspection. There is a little bit of undermining/tunneling here. With that being said I think we may want to try a little bit different dressing to see if this can be beneficial for her I am thinking endoform. 08/28/2020 upon evaluation today patient appears to be making a little bit of progress. This is measuring a little bit better I do believe the endoform has been beneficial for her. I did keep the small hole open and I do feel like that is showing some signs of improvement all of which is great news. 09/04/2020 upon evaluation today patient appears to be doing well with regard to her leg ulcer. She has been tolerating the dressing changes without complication. Fortunately there is no evidence of active infection at  this time. No fevers, chills, nausea, vomiting, or diarrhea. Objective Constitutional Well-nourished and well-hydrated in no acute distress. Vitals Time Taken: 1:33 PM, Height: 61 in, Weight: 175 lbs, BMI: 33.1, Temperature: 97.9 F, Pulse: 76 bpm, Respiratory Rate: 18 breaths/min, Blood Pressure: 131/70 mmHg. Respiratory normal breathing without difficulty. CORTANA, VANDERFORD ([id number]) Psychiatric this patient is able to make decisions and demonstrates good insight into disease process. Alert and Oriented x 3. pleasant and cooperative. General Notes: Upon inspection patient's wound again showed signs of excellent granulation she still has a very small area at roughly the 11:00 location where she has a little bit of depth in a circular pattern here this is extremely minimal and to be honest she is trying to pack some of the endoform down into there is much as she can. Nonetheless I do not see any signs of infection or worsening at this point. Integumentary (Hair, Skin) Wound #1 status is Open. Original cause of wound was Puncture. The wound is located on the Left,Anterior Lower Leg. The wound measures 0.5cm length x 0.6cm width x 0.2cm depth; 0.236cm^2 area and 0.047cm^3 volume. There is Fat Layer (Subcutaneous Tissue) exposed. There is tunneling at 8:00 with a maximum distance of 0.9cm. There is a medium amount of serous drainage noted. The wound margin is flat and intact. There is large (67-100%) pink, pale granulation within the wound bed. There is a small (1-33%) amount of necrotic tissue within the wound bed including Adherent Slough. Assessment Active Problems ICD-10 Venous insufficiency (chronic) (peripheral) Unspecified open wound, left lower leg, initial encounter Non-pressure chronic ulcer of other part of left lower leg with fat layer exposed [place] (primary) hypertension Plan Follow-up Appointments: Return Appointment in 1 week. Bathing/ Shower/ Hygiene: May shower;  gently cleanse wound with antibacterial soap,  rinse and pat dry prior to dressing wounds Edema Control - Lymphedema / Segmental Compressive Device / Other: Elevate, Exercise Daily and Avoid Standing for Long Periods of Time. WOUND #1: - Lower Leg Wound Laterality: Left, Anterior Topical: Gentamicin 3 x Per Week/30 Days Discharge Instructions: Apply as directed by provider. Primary Dressing: Endoform 2x2 (in/in) (Generic) 3 x Per Week/30 Days Discharge Instructions: Apply Endoform as directed Secondary Dressing: Non-Adherent Pad 3x8 (in/in) (Generic) 3 x Per Week/30 Days Secured With: Curad Paper Tape, 1x10 (in/yd) (Generic) 3 x Per Week/30 Days 1. Would recommend currently that we going to continue with the wound care measures as before and she is in agreement with plan we are using endoform right now which does seem to been beneficial. 2. I am also can recommend that we have the patient continue to use her compression stocking and elevate her legs much as possible she seems to be doing fairly well in that regard. We will see patient back for reevaluation in 1 week here in the clinic. If anything worsens or changes patient will contact our office for additional recommendations. Electronic Signature(s) Signed: 09/04/2020 2:34:04 PM By: [name] Entered By: Worthy Keeler on 09/04/2020 14:34:02 [place]ade, TostonMarland Kitchen ([id number]) -------------------------------------------------------------------------------- SuperBill Details Patient Name: Cindy Ohm C. Date of Service: 09/04/2020 Medical Record Number: [id number] Patient Account Number: [id number] Date of Birth/Sex: 1945-08-02 (74 y.o. F) Treating RN: Carlene Coria Primary Care Provider: [name] Other Clinician: Referring Provider: [name] Treating Provider/Extender: [name] in Treatment: 9 Diagnosis Coding ICD-10 Codes Code Description I87.2 Venous insufficiency (chronic) (peripheral) S81.802A  Unspecified open wound, left lower leg, initial encounter L97.822 Non-pressure chronic ulcer of other part of left lower leg with fat layer exposed [place] (primary) hypertension Facility Procedures CPT4 Code: [id number] Description: 01779 - WOUND CARE VISIT-LEV 3 EST PT Modifier: Quantity: 1 Physician Procedures CPT4 Code: [id number] Description: 23300 - WC PHYS LEVEL 3 - EST PT Modifier: Quantity: 1 CPT4 Code: Description: ICD-10 Diagnosis Description I87.2 Venous insufficiency (chronic) (peripheral) S81.802A Unspecified open wound, left lower leg, initial encounter L97.822 Non-pressure chronic ulcer of other part of left lower leg with fat lay [place]  (primary) hypertension Modifier: er exposed Quantity: Electronic Signature(s) Signed: 09/04/2020 2:39:53 PM By: [name] Entered By: Worthy Keeler on 09/04/2020 14:39:53

## 2020-09-06 ENCOUNTER — Encounter (INDEPENDENT_AMBULATORY_CARE_PROVIDER_SITE_OTHER): Payer: Self-pay | Admitting: Nurse Practitioner

## 2020-09-06 NOTE — Progress Notes (Signed)
Subjective:    Patient ID: [name], female    DOB: 02/22/1946, 75 y.o.   MRN: [id number] Chief Complaint  Patient presents with  . Follow-up    U/S Follow up    Patient presents today for evaluation of vascular causes of a slow healing ulceration of her left lower extremity.  The patient had an accident while doing yard work and a large branch fell on her shin.  It has been healing but very slowly.  Its been ongoing for several months.  Patient denies any fever, chills, nausea, vomiting or diarrhea.  It has been healing with the help of wound care.  The patient has utilized medical grade 1 compression as well in helping treat the wound.  She also elevates her lower extremities much as possible.  Despite these conservative efforts the wound is very slow to heal.  Today the patient has an ABI of 1.13 on the right and 1.09 on the left.  She has strong triphasic tibial artery waveforms bilaterally.  This is consistent with the previous studies that were done in 2019.  The patient also has no evidence of DVT or superficial venous thrombosis.  The patient does have evidence of reflux in her left lower extremity from her great saphenous vein at the saphenofemoral junction extending to the proximal calf.  A right has reflux in the great saphenous vein from the proximal to distal thigh.   Review of Systems  Cardiovascular: Positive for leg swelling.  All other systems reviewed and are negative.      Objective:   Physical Exam Vitals reviewed.  HENT:     Head: Normocephalic.  Cardiovascular:     Rate and Rhythm: Normal rate.     Pulses: Normal pulses.  Pulmonary:     Effort: Pulmonary effort is normal.  Skin:    General: Skin is warm and dry.  Neurological:     Mental Status: She is alert and oriented to person, place, and time.  Psychiatric:        Mood and Affect: Mood normal.        Behavior: Behavior normal.        Thought Content: Thought content normal.         Judgment: Judgment normal.     BP (!) 168/93   Pulse 69   Ht 5\' 1"  (1.549 m)   Wt 172 lb (78 kg)   BMI 32.50 kg/m   Past Medical History:  Diagnosis Date  . Asthma    WELL CONTROLLED  . Bulging lumbar disc   . Cancer [facility]) 1999   thyroid with recurrent 2006  . Complication of anesthesia    HEART STOPPED DURING TONSILLECTOMY (AGE 25) DUE TO ALLERGY TO ETHER  . DDD (degenerative disc disease), lumbar   . DDD (degenerative disc disease), thoracolumbar   . Diverticulosis   . Family history of adverse reaction to anesthesia    PTS FATHER-UNSURE WHAT HAPPENED  . GERD (gastroesophageal reflux disease)   . Heart murmur   . Hepatitis    AGE 64 "VIRAL"  . Hypertension    OFF MEDS CURRENTLY PER PCP (MASOUD)  . Hypothyroidism   . Interstitial cystitis   . Kidney disease   . PONV (postoperative nausea and vomiting)   . Thyroid disease     Social History   Socioeconomic History  . Marital status: Married    Spouse name: Not on file  . Number of children: Not on file  . Years  of education: Not on file  . Highest education level: Not on file  Occupational History  . Not on file  Tobacco Use  . Smoking status: Former Smoker    Packs/day: 1.00    Years: 15.00    Pack years: 15.00    Quit date: 11/25/1985    Years since quitting: 34.8  . Smokeless tobacco: Never Used  Vaping Use  . Vaping Use: Never used  Substance and Sexual Activity  . Alcohol use: No  . Drug use: No  . Sexual activity: Never  Other Topics Concern  . Not on file  Social History Narrative  . Not on file   Social Determinants of Health   Financial Resource Strain: Not on file  Food Insecurity: Not on file  Transportation Needs: Not on file  Physical Activity: Not on file  Stress: Not on file  Social Connections: Not on file  Intimate Partner Violence: Not on file    Past Surgical History:  Procedure Laterality Date  . BLADDER SURGERY  1984  . BREAST SURGERY  [id number]   mass removed  .  BREAST SURGERY  April 2017   Dr Jack Quarto [facility]  . CATARACT EXTRACTION  2011  . CHOLECYSTECTOMY N/A 11/30/2015   Procedure: LAPAROSCOPIC CHOLECYSTECTOMY WITH INTRAOPERATIVE CHOLANGIOGRAM;  Surgeon: Christene Lye, MD;  Location: [facility] ORS;  Service: General;  Laterality: N/A;  . COLONOSCOPY  2005, 2015  . DILATION AND CURETTAGE OF UTERUS  1975  . EXCISIONAL HEMORRHOIDECTOMY  1975  . EYE SURGERY Left 2011  . [place]  . HERNIA REPAIR  2015  . THYROIDECTOMY  1999  . TONSILLECTOMY  1952   AGE 26    Family History  Problem Relation Age of Onset  . Heart disease Mother   . Cancer Father        colon  . Heart disease Brother   . Diabetes Maternal Aunt   . Asthma Paternal Aunt   . Bladder Cancer Maternal Aunt   . Ovarian cancer Neg Hx   . Kidney cancer Neg Hx   . Prostate cancer Neg Hx     Allergies  Allergen Reactions  . Pollen Extract Other (See Comments)  . Erythromycin Other (See Comments)  . Ether     "HEART STOPPED WHILE IN SURGERY"  . Gramineae Pollens Other (See Comments)  . Montelukast Sodium Other (See Comments)  . Penicillins Hives  . Tamsulosin     Other reaction(s): Other (See Comments) "made me loopy"  . Band-Aid Plus Antibiotic [Bacitracin-Polymyxin B] Rash    CBC Latest Ref Rng & Units 09/01/2020 06/09/2020 03/06/2017  WBC 3.8 - 10.8 Thousand/uL 8.5 6.6 9.9  Hemoglobin 11.7 - 15.5 g/dL 12.9 12.3 13.5  Hematocrit 35.0 - 45.0 % 37.9 36.7 40.2  Platelets 140 - 400 Thousand/uL 322 319 320      CMP     Component Value Date/Time   NA 136 09/01/2020 1236   NA 137 07/08/2014 1631   K 4.5 09/01/2020 1236   K 3.7 07/08/2014 1631   CL 101 09/01/2020 1236   CL 104 07/08/2014 1631   CO2 26 09/01/2020 1236   CO2 25 07/08/2014 1631   GLUCOSE 79 09/01/2020 1236   GLUCOSE 108 (H) 07/08/2014 1631   BUN 26 (H) 09/01/2020 1236   BUN 13 07/08/2014 1631   CREATININE 1.14 (H) 09/01/2020 1236   CALCIUM 9.9 09/01/2020 1236   CALCIUM 9.5 07/08/2014  1631   PROT 6.6 09/01/2020 1236  PROT 7.5 07/08/2014 1631   ALBUMIN 4.1 07/08/2014 1631   AST 17 09/01/2020 1236   AST 20 07/08/2014 1631   ALT 20 09/01/2020 1236   ALT 39 07/08/2014 1631   ALKPHOS 66 07/08/2014 1631   BILITOT 0.6 09/01/2020 1236   BILITOT 0.5 07/08/2014 1631   GFRNONAA 47 (L) 09/01/2020 1236   GFRAA 54 (L) 09/01/2020 1236     VAS [place] ABI WITH/WO TBI  Result Date: 08/28/2020 LOWER EXTREMITY DOPPLER STUDY Indications: Left shin wound slow healing.  Comparison Study: 07/28/2017 Performing Technologist: Concha Norway RVT  Examination Guidelines: A complete evaluation includes at minimum, Doppler waveform signals and systolic blood pressure reading at the level of bilateral brachial, anterior tibial, and posterior tibial arteries, when vessel segments are accessible. Bilateral testing is considered an integral part of a complete examination. Photoelectric Plethysmograph (PPG) waveforms and toe systolic pressure readings are included as required and additional duplex testing as needed. Limited examinations for reoccurring indications may be performed as noted.  ABI Findings: +---------+------------------+-----+---------+--------+ Right    Rt Pressure (mmHg)IndexWaveform Comment  +---------+------------------+-----+---------+--------+ Brachial 161                                      +---------+------------------+-----+---------+--------+ ATA      166               1.03 triphasic         +---------+------------------+-----+---------+--------+ PTA      182               1.13 triphasic         +---------+------------------+-----+---------+--------+ Cleotis Nipper               0.79 Normal            +---------+------------------+-----+---------+--------+ +---------+------------------+-----+---------+-------+ Left     Lt Pressure (mmHg)IndexWaveform Comment +---------+------------------+-----+---------+-------+ ATA      167               1.04 triphasic         +---------+------------------+-----+---------+-------+ PTA      176               1.09 triphasic        +---------+------------------+-----+---------+-------+ Great Toe136               0.84 Normal           +---------+------------------+-----+---------+-------+ +-------+-----------+-----------+------------+------------+ ABI/TBIToday's ABIToday's TBIPrevious ABIPrevious TBI +-------+-----------+-----------+------------+------------+ Right  1.13       127        1.31        .98          +-------+-----------+-----------+------------+------------+ Left   1.09       .84        1.29        .98          +-------+-----------+-----------+------------+------------+  Summary: Right: Resting right ankle-brachial index is within normal range. No evidence of significant right lower extremity arterial disease. The right toe-brachial index is normal. Left: Resting left ankle-brachial index is within normal range. No evidence of significant left lower extremity arterial disease. The left toe-brachial index is normal.  *See table(s) above for measurements and observations.  Electronically signed by Leotis Pain MD on 08/28/2020 at 12:26:13 PM.   Final        Assessment & Plan:   1. Varicose veins of lower extremity with ulcer of other part of  lower leg with fat layer exposed, unspecified laterality ([place]) Recommend  I have reviewed my previous  discussion with the patient regarding  varicose veins and why they cause symptoms. Patient will continue  wearing graduated compression stockings class 1 on a daily basis, beginning first thing in the morning and removing them in the evening.    In addition, behavioral modification including elevation during the day was again discussed and this will continue.  The patient has utilized over the counter pain medications and has been exercising.  However, at this time conservative therapy has not alleviated the patient's symptoms of leg pain and  swelling  Recommend: laser ablation of the left great saphenous veins to eliminate the ulceration as well as pain and swelling of the lower extremities caused by the severe superficial venous reflux disease.   2. Mixed hyperlipidemia Continue statin as ordered and reviewed, no changes at this time   3. Essential hypertension Continue antihypertensive medications as already ordered, these medications have been reviewed and there are no changes at this time.    Current Outpatient Medications on File Prior to Visit  Medication Sig Dispense Refill  . albuterol (PROVENTIL) (2.5 MG/3ML) 0.083% nebulizer solution Inhale into the lungs.    . ALBUTEROL IN Inhale into the lungs as needed.    Marland Kitchen aspirin 81 MG tablet Take 81 mg by mouth daily.    . Calcium 500 MG CHEW Chew by mouth.    . cannabidiol (EPIDIOLEX) 100 MG/ML solution Take by mouth.    . cetirizine (ZYRTEC) 10 MG tablet Take 10 mg by mouth daily.    . Cholecalciferol (VITAMIN D-3) 1000 units CAPS Take by mouth daily.    Marland Kitchen doxycycline (VIBRA-TABS) 100 MG tablet Take 1 tablet (100 mg total) by mouth 2 (two) times daily. 20 tablet 0  . EPINEPHrine 0.3 mg/0.3 mL IJ SOAJ injection See admin instructions.    . fluconazole (DIFLUCAN) 100 MG tablet TAKE 1 TABLET BY MOUTH DAILY FOR 4 DAYS 4 tablet 0  . fluticasone (FLONASE) 50 MCG/ACT nasal spray Place 1 spray into both nostrils daily.    . Fluticasone-Salmeterol (ADVAIR) 250-50 MCG/DOSE AEPB Inhale 1 puff into the lungs 3 (three) times daily as needed.     . gabapentin (NEURONTIN) 100 MG capsule     . gentamicin cream (GARAMYCIN) 0.1 % SMARTSIG:Sparingly Topical 3 Times a Week    . HYDROcodone-acetaminophen (NORCO) 7.5-325 MG tablet Take by mouth.    . hydrOXYzine (ATARAX/VISTARIL) 25 MG tablet 1 tablet    . levofloxacin (LEVAQUIN) 250 MG tablet Take 1 tablet (250 mg total) by mouth daily. 7 tablet 0  . levothyroxine (SYNTHROID) 150 MCG tablet TAKE 1 TABLET BY MOUTH DAILY 90 tablet 3  .  olmesartan (BENICAR) 20 MG tablet Take 20 mg by mouth as needed (IF BP IS ELEVATED).    . Omega-3 Fatty Acids (FISH OIL) 1000 MG CAPS Take by mouth.    . pantoprazole (PROTONIX) 40 MG tablet Take 1 tablet by mouth every morning.     . potassium chloride SA (KLOR-CON) 20 MEQ tablet TAKE ONE TABLET BY MOUTH EVERY DAY 90 tablet 1  . predniSONE (DELTASONE) 2.5 MG tablet TAKE ONE TABLET EVERY DAY 30 tablet 6  . pregabalin (LYRICA) 50 MG capsule Take by mouth.    Marland Kitchen PRESCRIPTION MEDICATION Allergy injections once weekly    . raloxifene (EVISTA) 60 MG tablet   10  . tiZANidine (ZANAFLEX) 4 MG capsule Take 4 mg by mouth as needed.     Marland Kitchen  vitamin C (ASCORBIC ACID) 500 MG tablet Take by mouth.    . zolpidem (AMBIEN) 10 MG tablet Take 1 tablet (10 mg total) by mouth at bedtime. 90 tablet 1   No current facility-administered medications on file prior to visit.    There are no Patient Instructions on file for this visit. No follow-ups on file.   Kris Hartmann, NP

## 2020-09-07 NOTE — Progress Notes (Signed)
[name], [name] ([id number]) Visit Report for 09/04/2020 Arrival Information Details Patient Name: [name], [name]. Date of Service: 09/04/2020 1:30 PM Medical Record Number: [id number] Patient Account Number: [id number] Date of Birth/Sex: [birth year] (74 y.o. F) Treating RN: [name] Primary Care Ame Heagle: [name] Other Clinician: Referring [name]: [name] Treating [name]/Extender: [name] in Treatment: 9 Visit Information History Since Last Visit Has Dressing in Place as Prescribed: Yes Patient Arrived: Ambulatory Has Compression in Place as Prescribed: Yes Arrival Time: 13:30 Pain Present Now: No Accompanied By: [name] Transfer Assistance: None Patient Identification Verified: Yes Secondary Verification Process Completed: Yes Electronic Signature(s) Signed: 09/04/2020 5:45:00 PM By: [name], BSN, RN, CWS, [name] Entered By: [name], BSN, RN, CWS, Cindy on 09/04/2020 13:33:12 Salvato, [name] ([id number]) -------------------------------------------------------------------------------- Clinic Level of Care Assessment Details Patient Name: [name], [name] Date of Service: 09/04/2020 1:30 PM Medical Record Number: [id number] Patient Account Number: [id number] Date of Birth/Sex: 1945/07/31 (74 y.o. F) Treating RN: [name] Primary Care [name]: [name] Other Clinician: Referring [name]: [name] Treating [name]/Extender: [name] in Treatment: 9 Clinic Level of Care Assessment Items TOOL 4 Quantity Score X - Use when only an EandM is performed on FOLLOW-UP visit 1 0 ASSESSMENTS - Nursing Assessment / Reassessment X - Reassessment of Co-morbidities (includes updates in patient status) 1 10 X- 1 5 Reassessment of Adherence to Treatment Plan ASSESSMENTS - Wound and Skin Assessment / Reassessment X - Simple Wound Assessment / Reassessment - one wound 1 5 []  - 0 Complex Wound Assessment / Reassessment - multiple  wounds []  - 0 Dermatologic / Skin Assessment (not related to wound area) ASSESSMENTS - Focused Assessment []  - Circumferential Edema Measurements - multi extremities 0 []  - 0 Nutritional Assessment / Counseling / Intervention []  - 0 Lower Extremity Assessment (monofilament, tuning fork, pulses) []  - 0 Peripheral Arterial Disease Assessment (using hand held doppler) ASSESSMENTS - Ostomy and/or Continence Assessment and Care []  - Incontinence Assessment and Management 0 []  - 0 Ostomy Care Assessment and Management (repouching, etc.) PROCESS - Coordination of Care X - Simple Patient / Family Education for ongoing care 1 15 []  - 0 Complex (extensive) Patient / Family Education for ongoing care X- 1 10 Staff obtains Programmer, systems, Records, Test Results / Process Orders []  - 0 Staff telephones HHA, Nursing Homes / Clarify orders / etc []  - 0 Routine Transfer to another Facility (non-emergent condition) []  - 0 Routine Hospital Admission (non-emergent condition) []  - 0 New Admissions / Biomedical engineer / Ordering NPWT, Apligraf, etc. []  - 0 Emergency Hospital Admission (emergent condition) X- 1 10 Simple Discharge Coordination []  - 0 Complex (extensive) Discharge Coordination PROCESS - Special Needs []  - Pediatric / Minor Patient Management 0 []  - 0 Isolation Patient Management []  - 0 Hearing / Language / Visual special needs []  - 0 Assessment of Community assistance (transportation, D/C planning, etc.) []  - 0 Additional assistance / Altered mentation []  - 0 Support Surface(s) Assessment (bed, cushion, seat, etc.) INTERVENTIONS - Wound Cleansing / Measurement [name], [name] ([id number]) X- 1 5 Simple Wound Cleansing - one wound []  - 0 Complex Wound Cleansing - multiple wounds X- 1 5 Wound Imaging (photographs - any number of wounds) []  - 0 Wound Tracing (instead of photographs) X- 1 5 Simple Wound Measurement - one wound []  - 0 Complex Wound Measurement -  multiple wounds INTERVENTIONS - Wound Dressings X - Small Wound Dressing one or multiple wounds 1 10 []  - 0 Medium  Wound Dressing one or multiple wounds []  - 0 Large Wound Dressing one or multiple wounds []  - 0 Application of Medications - topical []  - 0 Application of Medications - injection INTERVENTIONS - Miscellaneous []  - External ear exam 0 []  - 0 Specimen Collection (cultures, biopsies, blood, body fluids, etc.) []  - 0 Specimen(s) / Culture(s) sent or taken to Lab for analysis []  - 0 Patient Transfer (multiple staff / Civil Service fast streamer / Similar devices) []  - 0 Simple Staple / Suture removal (25 or less) []  - 0 Complex Staple / Suture removal (26 or more) []  - 0 Hypo / Hyperglycemic Management (close monitor of Blood Glucose) []  - 0 Ankle / Brachial Index (ABI) - do not check if billed separately X- 1 5 Vital Signs Has the patient been seen at the hospital within the last three years: Yes Total Score: 85 Level Of Care: New/Established - Level 3 Electronic Signature(s) Signed: 09/07/2020 4:08:54 PM By: Cindy Coria RN Entered By: [name] on 09/04/2020 13:56:36 [name] Kitchen ([id number]) -------------------------------------------------------------------------------- Encounter Discharge Information Details Patient Name: [name], [name] Date of Service: 09/04/2020 1:30 PM Medical Record Number: [id number] Patient Account Number: [id number] Date of Birth/Sex: 05-Apr-1946 (74 y.o. F) Treating RN: [name] Primary Care Davion Flannery: [name] Other Clinician: Referring [name]: [name] Treating Ayiana Winslett/Extender: [name] in Treatment: 9 Encounter Discharge Information Items Discharge Condition: Stable Ambulatory Status: Ambulatory Discharge Destination: Home Transportation: Private Auto Accompanied By: [name] Schedule Follow-up Appointment: Yes Clinical Summary of Care: Patient Declined Electronic Signature(s) Signed: 09/07/2020 4:08:54  PM By: Cindy Coria RN Entered By: [name] on 09/04/2020 14:17:56 Strain, [name] ([id number]) -------------------------------------------------------------------------------- Lower Extremity Assessment Details Patient Name: [name], [name] Date of Service: 09/04/2020 1:30 PM Medical Record Number: [id number] Patient Account Number: [id number] Date of Birth/Sex: 1946/02/03 (74 y.o. F) Treating RN: [name] Primary Care Malita Ignasiak: [name] Other Clinician: Referring Rubby Barbary: [name] Treating Welford Christmas/Extender: [name] in Treatment: 9 Edema Assessment Assessed: [Left: No] [Right: No] [Left: Edema] [Right: :] Calf Left: Right: Point of Measurement: 32 cm From Medial Instep 39 cm Ankle Left: Right: Point of Measurement: 10 cm From Medial Instep 23 cm Vascular Assessment Pulses: Dorsalis Pedis Palpable: [Left:Yes] Electronic Signature(s) Signed: 09/04/2020 5:45:00 PM By: [name], BSN, RN, CWS, [name] Entered By: [name], BSN, RN, CWS, Cindy on 09/04/2020 13:42:16 Letarte, Nedra C. ([id number]) -------------------------------------------------------------------------------- Multi Wound Chart Details Patient Name: [name], [name] Date of Service: 09/04/2020 1:30 PM Medical Record Number: [id number] Patient Account Number: [id number] Date of Birth/Sex: 14-Sep-1945 (74 y.o. F) Treating RN: [name] Primary Care Gracelee Stemmler: [name] Other Clinician: Referring Burgess Sheriff: [name] Treating Righteous Claiborne/Extender: [name] in Treatment: 9 Vital Signs Height(in): 61 Pulse(bpm): 76 Weight(lbs): 175 Blood Pressure(mmHg): 131/70 Body Mass Index(BMI): 33 Temperature(F): 97.9 Respiratory Rate(breaths/min): 18 Photos: [N/A:N/A] Wound Location: Left, Anterior Lower Leg N/A N/A Wounding Event: Puncture N/A N/A Primary Etiology: Skin Tear N/A N/A Comorbid History: Cataracts, Asthma, Hypertension, N/A N/A Osteoarthritis, Received  Radiation Date Acquired: 05/18/2020 N/A N/A Weeks of Treatment: 9 N/A N/A Wound Status: Open N/A N/A Measurements L x W x D (cm) 0.5x0.6x0.2 N/A N/A Area (cm) : 0.236 N/A N/A Volume (cm) : 0.047 N/A N/A % Reduction in Area: 84.60% N/A N/A % Reduction in Volume: 69.30% N/A N/A Position 1 (o'clock): 8 Maximum Distance 1 (cm): 0.9 Tunneling: Yes N/A N/A Classification: Full Thickness Without Exposed N/A N/A Support Structures Exudate Amount: Medium N/A N/A Exudate Type: Serous N/A N/A Exudate Color:  amber N/A N/A Wound Margin: Flat and Intact N/A N/A Granulation Amount: Large (67-100%) N/A N/A Granulation Quality: Pink, Pale N/A N/A Necrotic Amount: Small (1-33%) N/A N/A Exposed Structures: Fat Layer (Subcutaneous Tissue): N/A N/A Yes Fascia: No Tendon: No Muscle: No Joint: No Bone: No Epithelialization: None N/A N/A Treatment Notes Electronic Signature(s) Signed: 09/07/2020 4:08:54 PM By: Cindy Coria RN Entered By: [name] on 09/04/2020 13:54:47 Finder, [name] ([id number]) Gregory, Nyia Loletha Grayer ([id number]) -------------------------------------------------------------------------------- Multi-Disciplinary Care Plan Details Patient Name: [name], Makaila C. Date of Service: 09/04/2020 1:30 PM Medical Record Number: [id number] Patient Account Number: [id number] Date of Birth/Sex: 1945-10-03 (74 y.o. F) Treating RN: [name] Primary Care Hillis Mcphatter: [name] Other Clinician: Referring Ikenna Ohms: [name] Treating Granvil Djordjevic/Extender: [name] in Treatment: 9 Active Inactive Wound/Skin Impairment Nursing Diagnoses: Impaired tissue integrity Goals: Ulcer/skin breakdown will have a volume reduction of 30% by week 4 Date Initiated: 07/03/2020 Date Inactivated: 08/28/2020 Target Resolution Date: 07/31/2020 Goal Status: Unmet Unmet Reason: comorbities Ulcer/skin breakdown will have a volume reduction of 50% by week 8 Date Initiated:  08/28/2020 Target Resolution Date: 09/25/2020 Goal Status: Active Interventions: Assess ulceration(s) every visit Notes: Electronic Signature(s) Signed: 09/07/2020 4:08:54 PM By: Cindy Coria RN Entered By: [name] on 09/04/2020 13:54:38 [name], [name] ([id number]) -------------------------------------------------------------------------------- Pain Assessment Details Patient Name: [name], [name] Date of Service: 09/04/2020 1:30 PM Medical Record Number: [id number] Patient Account Number: [id number] Date of Birth/Sex: [birth year] (74 y.o. F) Treating RN: [name] Primary Care Lillyauna Jenkinson: [name] Other Clinician: Referring Agron Swiney: [name] Treating Emitt Maglione/Extender: [name] in Treatment: 9 Active Problems Location of Pain Severity and Description of Pain Patient Has Paino Yes Site Locations Pain Location: Generalized Pain Pain Management and Medication Current Pain Management: Electronic Signature(s) Signed: 09/04/2020 5:45:00 PM By: [name], BSN, RN, CWS, [name] Entered By: [name], BSN, RN, CWS, Cindy on 09/04/2020 13:33:57 [name], [name] ([id number]) -------------------------------------------------------------------------------- Patient/Caregiver Education Details Patient Name: [name] Date of Service: 09/04/2020 1:30 PM Medical Record Number: [id number] Patient Account Number: [id number] Date of Birth/Gender: 1945-08-03 (74 y.o. F) Treating RN: [name] Primary Care Physician: [name] Other Clinician: Referring Physician: Cletis [name] Treating Physician/Extender: [name] in Treatment: 9 Education Assessment Education Provided To: Patient Education Topics Provided Wound/Skin Impairment: Methods: Explain/Verbal Responses: State content correctly Electronic Signature(s) Signed: 09/07/2020 4:08:54 PM By: Cindy Coria RN Entered By: [name] on 09/04/2020 13:57:20 [name], [name]  ([id number]) -------------------------------------------------------------------------------- Wound Assessment Details Patient Name: [name], [name] Date of Service: 09/04/2020 1:30 PM Medical Record Number: [id number] Patient Account Number: [id number] Date of Birth/Sex: 15-Nov-1945 (74 y.o. F) Treating RN: [name] Primary Care Trace Wirick: [name] Other Clinician: Referring Tanicka Bisaillon: [name] Treating Deion Swift/Extender: [name] in Treatment: 9 Wound Status Wound Number: 1 Primary Skin Tear Etiology: Wound Location: Left, Anterior Lower Leg Wound Status: Open Wounding Event: Puncture Comorbid Cataracts, Asthma, Hypertension, Osteoarthritis, Date Acquired: 05/18/2020 History: Received Radiation Weeks Of Treatment: 9 Clustered Wound: No Photos Wound Measurements Length: (cm) 0.5 Width: (cm) 0.6 Depth: (cm) 0.2 Area: (cm) 0.236 Volume: (cm) 0.047 % Reduction in Area: 84.6% % Reduction in Volume: 69.3% Epithelialization: None Tunneling: Yes Position (o'clock): 8 Maximum Distance: (cm) 0.9 Wound Description Classification: Full Thickness Without Exposed Support Structu Wound Margin: Flat and Intact Exudate Amount: Medium Exudate Type: Serous Exudate Color: amber res Foul Odor After Cleansing: No Slough/Fibrino Yes Wound Bed Granulation Amount: Large (67-100%) Exposed Structure Granulation Quality: Pink, Pale Fascia Exposed: No Necrotic Amount: Small (  1-33%) Fat Layer (Subcutaneous Tissue) Exposed: Yes Necrotic Quality: Adherent Slough Tendon Exposed: No Muscle Exposed: No Joint Exposed: No Bone Exposed: No Treatment Notes Wound #1 (Lower Leg) Wound Laterality: Left, Anterior Cleanser Peri-Wound Care [name], [name] ([id number]) Topical Gentamicin Discharge Instruction: Apply as directed by Mandel Seiden. Primary Dressing Endoform 2x2 (in/in) Discharge Instruction: Apply Endoform as directed Secondary Dressing Non-Adherent Pad 3x8  (in/in) Secured With Curad Paper Tape, 1x10 (in/yd) Compression Wrap Compression Stockings Add-Ons Electronic Signature(s) Signed: 09/04/2020 5:45:00 PM By: [name], BSN, RN, CWS, [name] Entered By: [name], BSN, RN, CWS, Cindy on 09/04/2020 13:38:23 [name], [name] ([id number]) -------------------------------------------------------------------------------- Vitals Details Patient Name: [name] Date of Service: 09/04/2020 1:30 PM Medical Record Number: [id number] Patient Account Number: [id number] Date of Birth/Sex: 1946/02/15 (74 y.o. F) Treating RN: [name] Primary Care Kaci Freel: [name] Other Clinician: Referring Ausar Georgiou: [name] Treating Kmari Halter/Extender: [name] in Treatment: 9 Vital Signs Time Taken: 13:33 Temperature (F): 97.9 Height (in): 61 Pulse (bpm): 76 Weight (lbs): 175 Respiratory Rate (breaths/min): 18 Body Mass Index (BMI): 33.1 Blood Pressure (mmHg): 131/70 Reference Range: 80 - 120 mg / dl Electronic Signature(s) Signed: 09/04/2020 5:45:00 PM By: [name], BSN, RN, CWS, [name] Entered By: [name], BSN, RN, CWS, Cindy on 09/04/2020 13:33:37

## 2020-09-08 ENCOUNTER — Ambulatory Visit (INDEPENDENT_AMBULATORY_CARE_PROVIDER_SITE_OTHER): Payer: Medicare Other | Admitting: Gastroenterology

## 2020-09-08 ENCOUNTER — Other Ambulatory Visit: Payer: Self-pay

## 2020-09-08 ENCOUNTER — Encounter: Payer: Self-pay | Admitting: Gastroenterology

## 2020-09-08 VITALS — BP 151/74 | HR 72 | Ht 61.0 in | Wt 172.6 lb

## 2020-09-08 DIAGNOSIS — R1319 Other dysphagia: Secondary | ICD-10-CM

## 2020-09-08 DIAGNOSIS — R634 Abnormal weight loss: Secondary | ICD-10-CM | POA: Diagnosis not present

## 2020-09-08 MED ORDER — PEG 3350-KCL-NA BICARB-NACL 420 G PO SOLR: ORAL | 0 refills | Status: DC

## 2020-09-08 NOTE — H&P (View-Only) (Signed)
Gastroenterology Consultation  Referring Provider:     [name], MD Primary Care Physician:  [name], MD Primary Gastroenterologist:  Dr. Allen Norris     Reason for Consultation:     Dysphagia        HPI:   [name] is a 75 y.o. y/o female referred for consultation & management of Dysphagia by Dr. [name], MD.  This patient comes to see me after being seen in the past by Dr. Jamal Collin for colonoscopy in 2015. At that time it was being done because of a family history in a first-degree relative with colon cancer as per the report and it was recommended that the patient have a repeat colonoscopy in 5 years.  The patient was also found to have diverticulosis of the colon at that time. She says she lost 10 lbs in the last 4 weeks without trying. The patient reports that she has been having some trouble when she swallows.  She feels like things are getting stuck in her throat.  She states that the symptoms are worse with pills and has been bad with chicken.  There is no report of any black stools or bloody stools.  The patient has not had a repeat colonoscopy since 2015.  There is no report of any abdominal pain but the patient does report that she has been feeling weaker and more tired recently.  Past Medical History:  Diagnosis Date  . Asthma    WELL CONTROLLED  . Bulging lumbar disc   . Cancer [facility]) 1999   thyroid with recurrent 2006  . Complication of anesthesia    HEART STOPPED DURING TONSILLECTOMY (AGE 61) DUE TO ALLERGY TO ETHER  . DDD (degenerative disc disease), lumbar   . DDD (degenerative disc disease), thoracolumbar   . Diverticulosis   . Family history of adverse reaction to anesthesia    PTS FATHER-UNSURE WHAT HAPPENED  . GERD (gastroesophageal reflux disease)   . Heart murmur   . Hepatitis    AGE 37 "VIRAL"  . Hypertension    OFF MEDS CURRENTLY PER PCP (MASOUD)  . Hypothyroidism   . Interstitial cystitis   . Kidney disease   . PONV (postoperative  nausea and vomiting)   . Thyroid disease     Past Surgical History:  Procedure Laterality Date  . BLADDER SURGERY  1984  . BREAST SURGERY  [id number]   mass removed  . BREAST SURGERY  April 2017   Dr Jack [facility]  . CATARACT EXTRACTION  2011  . CHOLECYSTECTOMY N/A 11/30/2015   Procedure: LAPAROSCOPIC CHOLECYSTECTOMY WITH INTRAOPERATIVE CHOLANGIOGRAM;  Surgeon: Christene Lye, MD;  Location: [facility] ORS;  Service: General;  Laterality: N/A;  . COLONOSCOPY  2005, 2015  . DILATION AND CURETTAGE OF UTERUS  1975  . EXCISIONAL HEMORRHOIDECTOMY  1975  . EYE SURGERY Left 2011  . [place]  . HERNIA REPAIR  2015  . THYROIDECTOMY  1999  . TONSILLECTOMY  1952   AGE 61    Prior to Admission medications   Medication Sig Start Date End Date Taking? Authorizing Provider  albuterol (PROVENTIL) (2.5 MG/3ML) 0.083% nebulizer solution Inhale into the lungs.    [provider]  ALBUTEROL IN Inhale into the lungs as needed.    [provider]  aspirin 81 MG tablet Take 81 mg by mouth daily.    [provider]  Calcium 500 MG CHEW Chew by mouth.    [provider]  cannabidiol ([place])  100 MG/ML solution Take by mouth.    [provider]  cetirizine (ZYRTEC) 10 MG tablet Take 10 mg by mouth daily.    [provider]  Cholecalciferol (VITAMIN D-3) 1000 units CAPS Take by mouth daily.    [provider]  doxycycline (VIBRA-TABS) 100 MG tablet Take 1 tablet (100 mg total) by mouth 2 (two) times daily. 06/24/20   [name], MD  EPINEPHrine 0.3 mg/0.3 mL IJ SOAJ injection See admin instructions. 02/04/19   [provider]  fluconazole (DIFLUCAN) 100 MG tablet TAKE 1 TABLET BY MOUTH DAILY FOR 4 DAYS 07/09/20   Beckie Salts, FNP  FLUoxetine (PROZAC) 40 MG capsule Take 1 capsule (40 mg total) by mouth every morning. 09/01/20   [name], MD  fluticasone (FLONASE) 50 MCG/ACT nasal spray Place 1 spray into both  nostrils daily.    [provider]  Fluticasone-Salmeterol (ADVAIR) 250-50 MCG/DOSE AEPB Inhale 1 puff into the lungs 3 (three) times daily as needed.     [provider]  furosemide (LASIX) 20 MG tablet Take 1 tablet (20 mg total) by mouth daily. 09/01/20   [name], MD  gabapentin (NEURONTIN) 100 MG capsule  12/01/16   [provider]  gentamicin cream (GARAMYCIN) 0.1 % SMARTSIG:Sparingly Topical 3 Times a Week 07/07/20   [provider]  HYDROcodone-acetaminophen ([place]) 7.5-325 MG tablet Take by mouth. 04/21/17   [provider]  hydrOXYzine (ATARAX/VISTARIL) 25 MG tablet 1 tablet    [provider]  levofloxacin (LEVAQUIN) 250 MG tablet Take 1 tablet (250 mg total) by mouth daily. 05/26/20   [name], MD  levothyroxine (SYNTHROID) 150 MCG tablet TAKE 1 TABLET BY MOUTH DAILY 01/02/20   [name], MD  olmesartan (BENICAR) 20 MG tablet Take 20 mg by mouth as needed (IF BP IS ELEVATED).    [provider]  Omega-3 Fatty Acids (FISH OIL) 1000 MG CAPS Take by mouth.    [provider]  pantoprazole (PROTONIX) 40 MG tablet Take 1 tablet by mouth every morning.  04/25/14   [provider]  potassium chloride SA (KLOR-CON) 20 MEQ tablet TAKE ONE TABLET BY MOUTH EVERY DAY 08/06/20   Beckie Salts, FNP  predniSONE (DELTASONE) 2.5 MG tablet TAKE ONE TABLET EVERY DAY 04/21/20   [name], MD  pregabalin (LYRICA) 50 MG capsule Take by mouth. 05/19/17   [provider]  PRESCRIPTION MEDICATION Allergy injections once weekly    [provider]  raloxifene (EVISTA) 60 MG tablet  12/07/15   [provider]  tiZANidine (ZANAFLEX) 4 MG capsule Take 4 mg by mouth as needed.     [provider]  vitamin C (ASCORBIC ACID) 500 MG tablet Take by mouth.    [provider]  zolpidem (AMBIEN) 10 MG tablet Take 1 tablet (10 mg total) by mouth at bedtime. 06/16/20 09/14/20  [name],  MD    Family History  Problem Relation Age of Onset  . Heart disease Mother   . Cancer Father        colon  . Heart disease Brother   . Diabetes Maternal Aunt   . Asthma Paternal Aunt   . Bladder Cancer Maternal Aunt   . Ovarian cancer Neg Hx   . Kidney cancer Neg Hx   . Prostate cancer Neg Hx      Social History   Tobacco Use  . Smoking status: Former Smoker    Packs/day: 1.00    Years: 15.00  Pack years: 15.00    Quit date: 11/25/1985    Years since quitting: 34.8  . Smokeless tobacco: Never Used  Vaping Use  . Vaping Use: Never used  Substance Use Topics  . Alcohol use: No  . Drug use: No    Allergies as of 09/08/2020 - Review Complete 09/06/2020  Allergen Reaction Noted  . Pollen extract Other (See Comments) 11/09/2012  . Erythromycin Other (See Comments) 08/06/2020  . Ether  11/26/2015  . Gramineae pollens Other (See Comments) 11/09/2012  . Montelukast sodium Other (See Comments) 08/06/2020  . Penicillins Hives 04/09/2014  . Tamsulosin  05/01/2017  . Band-aid plus antibiotic [bacitracin-polymyxin b] Rash 11/27/2015    Review of Systems:    All systems reviewed and negative except where noted in HPI.   Physical Exam:  There were no vitals taken for this visit. No LMP recorded. Patient is postmenopausal. General:   Alert,  Well-developed, well-nourished, pleasant and cooperative in NAD Head:  Normocephalic and atraumatic. Eyes:  Sclera clear, no icterus.   Conjunctiva pink. Ears:  Normal auditory acuity. Neck:  Supple; no masses or thyromegaly. Lungs:  Respirations even and unlabored.  Clear throughout to auscultation.   No wheezes, crackles, or rhonchi. No acute distress. Heart:  Regular rate and rhythm; no murmurs, clicks, rubs, or gallops. Abdomen:  Normal bowel sounds.  No bruits.  Soft, non-tender and non-distended without masses, hepatosplenomegaly or hernias noted.  No guarding or rebound tenderness.  Negative Carnett sign.   Rectal:  Deferred.   Pulses:  Normal pulses noted. Extremities:  No clubbing or edema.  No cyanosis. Neurologic:  Alert and oriented x3;  grossly normal neurologically. Skin:  Intact without significant lesions or rashes.  No jaundice. Lymph Nodes:  No significant cervical adenopathy. Psych:  Alert and cooperative. Normal mood and affect.  Imaging Studies: VAS [place] ABI WITH/WO TBI  Result Date: 08/28/2020 LOWER EXTREMITY DOPPLER STUDY Indications: Left shin wound slow healing.  Comparison Study: 07/28/2017 Performing Technologist: Concha Norway RVT  Examination Guidelines: A complete evaluation includes at minimum, Doppler waveform signals and systolic blood pressure reading at the level of bilateral brachial, anterior tibial, and posterior tibial arteries, when vessel segments are accessible. Bilateral testing is considered an integral part of a complete examination. Photoelectric Plethysmograph (PPG) waveforms and toe systolic pressure readings are included as required and additional duplex testing as needed. Limited examinations for reoccurring indications may be performed as noted.  ABI Findings: +---------+------------------+-----+---------+--------+ Right    Rt Pressure (mmHg)IndexWaveform Comment  +---------+------------------+-----+---------+--------+ Brachial 161                                      +---------+------------------+-----+---------+--------+ ATA      166               1.03 triphasic         +---------+------------------+-----+---------+--------+ PTA      182               1.13 triphasic         +---------+------------------+-----+---------+--------+ Cleotis Nipper               0.79 Normal            +---------+------------------+-----+---------+--------+ +---------+------------------+-----+---------+-------+ Left     Lt Pressure (mmHg)IndexWaveform Comment +---------+------------------+-----+---------+-------+ ATA      167               1.04 triphasic         +---------+------------------+-----+---------+-------+  PTA      176               1.09 triphasic        +---------+------------------+-----+---------+-------+ Great Toe136               0.84 Normal           +---------+------------------+-----+---------+-------+ +-------+-----------+-----------+------------+------------+ ABI/TBIToday's ABIToday's TBIPrevious ABIPrevious TBI +-------+-----------+-----------+------------+------------+ Right  1.13       127        1.31        .98          +-------+-----------+-----------+------------+------------+ Left   1.09       .84        1.29        .98          +-------+-----------+-----------+------------+------------+  Summary: Right: Resting right ankle-brachial index is within normal range. No evidence of significant right lower extremity arterial disease. The right toe-brachial index is normal. Left: Resting left ankle-brachial index is within normal range. No evidence of significant left lower extremity arterial disease. The left toe-brachial index is normal.  *See table(s) above for measurements and observations.  Electronically signed by Leotis Pain MD on 08/28/2020 at 12:26:13 PM.   Final    VAS [place] LOWER EXTREMITY VENOUS REFLUX  Result Date: 08/28/2020  Lower Venous Reflux Study Other Indications: VVI. Comparison Study: 2019 Performing Technologist: Concha Norway RVT  Examination Guidelines: A complete evaluation includes B-mode imaging, spectral Doppler, color Doppler, and power Doppler as needed of all accessible portions of each vessel. Bilateral testing is considered an integral part of a complete examination. Limited examinations for reoccurring indications may be performed as noted. The reflux portion of the exam is performed with the patient in reverse Trendelenburg. Significant venous reflux is defined as >500 ms in the superficial venous system, and >1 second in the deep venous system.  Venous Reflux Times  +--------------+---------+------+-----------+------------+--------+ RIGHT         Reflux NoRefluxReflux TimeDiameter cmsComments                         Yes                                  +--------------+---------+------+-----------+------------+--------+ GSV prox thigh          yes    >500 ms      .51              +--------------+---------+------+-----------+------------+--------+ GSV mid thigh           yes    >500 ms      .54              +--------------+---------+------+-----------+------------+--------+ GSV dist thigh          yes    >500 ms      .52              +--------------+---------+------+-----------+------------+--------+  +--------------+---------+------+-----------+------------+--------+ LEFT          Reflux NoRefluxReflux TimeDiameter cmsComments                         Yes                                  +--------------+---------+------+-----------+------------+--------+ GSV at [facility]  yes    >500 ms      .74              +--------------+---------+------+-----------+------------+--------+ GSV prox thigh          yes    >500 ms      .59              +--------------+---------+------+-----------+------------+--------+ GSV mid thigh           yes    >500 ms      .56              +--------------+---------+------+-----------+------------+--------+ GSV dist thigh          yes    >500 ms      .56              +--------------+---------+------+-----------+------------+--------+ GSV at knee             yes    >500 ms      .66              +--------------+---------+------+-----------+------------+--------+ GSV prox calf           yes    >500 ms      .46              +--------------+---------+------+-----------+------------+--------+   Summary: Bilateral: - No evidence of deep vein thrombosis seen in the lower extremities, bilaterally, from the common femoral through the popliteal veins. - No evidence of  superficial venous thrombosis in the lower extremities, bilaterally. - No evidence of deep venous insufficiency seen bilaterally in the lower extremity.  Right: - Venous reflux is noted in the right greater saphenous vein in the thigh.  Left: - Venous reflux is noted in the left greater saphenous vein in the thigh. - Venous reflux is noted in the left greater saphenous vein in the calf.  *See table(s) above for measurements and observations. Electronically signed by Leotis Pain MD on 08/28/2020 at 12:26:18 PM.    Final     Assessment and Plan:   [name] is a 75 y.o. y/o female who comes in today with a history of dysphagia and was recommended at her previous colonoscopy to have a repeat cause.  5 years.  The last colonoscopy was in 2015. The patient will be set up for an EGD and colonoscopy to look for a source of her dysphagia and unexplained weight loss.  The patient is due for repeat colonoscopy because a reported history of a first-degree relative with colon cancer.  The patient has been explained the plan and agrees with it.    Lucilla Lame, MD. Marval Regal    Note: This dictation was prepared with Dragon dictation along with smaller phrase technology. Any transcriptional errors that result from this process are unintentional.

## 2020-09-08 NOTE — Progress Notes (Signed)
Gastroenterology Consultation  Referring Provider:     [name], MD Primary Care Physician:  [name], MD Primary Gastroenterologist:  Dr. Allen Norris     Reason for Consultation:     Dysphagia        HPI:   [name] is a 75 y.o. y/o female referred for consultation & management of Dysphagia by Dr. [name], MD.  This patient comes to see me after being seen in the past by Dr. Jamal Collin for colonoscopy in 2015. At that time it was being done because of a family history in a first-degree relative with colon cancer as per the report and it was recommended that the patient have a repeat colonoscopy in 5 years.  The patient was also found to have diverticulosis of the colon at that time. She says she lost 10 lbs in the last 4 weeks without trying. The patient reports that she has been having some trouble when she swallows.  She feels like things are getting stuck in her throat.  She states that the symptoms are worse with pills and has been bad with chicken.  There is no report of any black stools or bloody stools.  The patient has not had a repeat colonoscopy since 2015.  There is no report of any abdominal pain but the patient does report that she has been feeling weaker and more tired recently.  Past Medical History:  Diagnosis Date  . Asthma    WELL CONTROLLED  . Bulging lumbar disc   . Cancer [facility]) 1999   thyroid with recurrent 2006  . Complication of anesthesia    HEART STOPPED DURING TONSILLECTOMY (AGE 63) DUE TO ALLERGY TO ETHER  . DDD (degenerative disc disease), lumbar   . DDD (degenerative disc disease), thoracolumbar   . Diverticulosis   . Family history of adverse reaction to anesthesia    PTS FATHER-UNSURE WHAT HAPPENED  . GERD (gastroesophageal reflux disease)   . Heart murmur   . Hepatitis    AGE 53 "VIRAL"  . Hypertension    OFF MEDS CURRENTLY PER PCP (MASOUD)  . Hypothyroidism   . Interstitial cystitis   . Kidney disease   . PONV (postoperative  nausea and vomiting)   . Thyroid disease     Past Surgical History:  Procedure Laterality Date  . BLADDER SURGERY  1984  . BREAST SURGERY  [id number]   mass removed  . BREAST SURGERY  April 2017   Dr Jack Quarto [facility]  . CATARACT EXTRACTION  2011  . CHOLECYSTECTOMY N/A 11/30/2015   Procedure: LAPAROSCOPIC CHOLECYSTECTOMY WITH INTRAOPERATIVE CHOLANGIOGRAM;  Surgeon: Christene Lye, MD;  Location: [facility] ORS;  Service: General;  Laterality: N/A;  . COLONOSCOPY  2005, 2015  . DILATION AND CURETTAGE OF UTERUS  1975  . EXCISIONAL HEMORRHOIDECTOMY  1975  . EYE SURGERY Left 2011  . [place]  . HERNIA REPAIR  2015  . THYROIDECTOMY  1999  . TONSILLECTOMY  1952   AGE 63    Prior to Admission medications   Medication Sig Start Date End Date Taking? Authorizing Provider  albuterol (PROVENTIL) (2.5 MG/3ML) 0.083% nebulizer solution Inhale into the lungs.    [provider]  ALBUTEROL IN Inhale into the lungs as needed.    [provider]  aspirin 81 MG tablet Take 81 mg by mouth daily.    [provider]  Calcium 500 MG CHEW Chew by mouth.    [provider]  cannabidiol ([place])  100 MG/ML solution Take by mouth.    [provider]  cetirizine (ZYRTEC) 10 MG tablet Take 10 mg by mouth daily.    [provider]  Cholecalciferol (VITAMIN D-3) 1000 units CAPS Take by mouth daily.    [provider]  doxycycline (VIBRA-TABS) 100 MG tablet Take 1 tablet (100 mg total) by mouth 2 (two) times daily. 06/24/20   [name], MD  EPINEPHrine 0.3 mg/0.3 mL IJ SOAJ injection See admin instructions. 02/04/19   [provider]  fluconazole (DIFLUCAN) 100 MG tablet TAKE 1 TABLET BY MOUTH DAILY FOR 4 DAYS 07/09/20   Beckie Salts, FNP  FLUoxetine (PROZAC) 40 MG capsule Take 1 capsule (40 mg total) by mouth every morning. 09/01/20   [name], MD  fluticasone (FLONASE) 50 MCG/ACT nasal spray Place 1 spray into both  nostrils daily.    [provider]  Fluticasone-Salmeterol (ADVAIR) 250-50 MCG/DOSE AEPB Inhale 1 puff into the lungs 3 (three) times daily as needed.     [provider]  furosemide (LASIX) 20 MG tablet Take 1 tablet (20 mg total) by mouth daily. 09/01/20   [name], MD  gabapentin (NEURONTIN) 100 MG capsule  12/01/16   [provider]  gentamicin cream (GARAMYCIN) 0.1 % SMARTSIG:Sparingly Topical 3 Times a Week 07/07/20   [provider]  HYDROcodone-acetaminophen ([place]) 7.5-325 MG tablet Take by mouth. 04/21/17   [provider]  hydrOXYzine (ATARAX/VISTARIL) 25 MG tablet 1 tablet    [provider]  levofloxacin (LEVAQUIN) 250 MG tablet Take 1 tablet (250 mg total) by mouth daily. 05/26/20   [name], MD  levothyroxine (SYNTHROID) 150 MCG tablet TAKE 1 TABLET BY MOUTH DAILY 01/02/20   [name], MD  olmesartan (BENICAR) 20 MG tablet Take 20 mg by mouth as needed (IF BP IS ELEVATED).    [provider]  Omega-3 Fatty Acids (FISH OIL) 1000 MG CAPS Take by mouth.    [provider]  pantoprazole (PROTONIX) 40 MG tablet Take 1 tablet by mouth every morning.  04/25/14   [provider]  potassium chloride SA (KLOR-CON) 20 MEQ tablet TAKE ONE TABLET BY MOUTH EVERY DAY 08/06/20   Beckie Salts, FNP  predniSONE (DELTASONE) 2.5 MG tablet TAKE ONE TABLET EVERY DAY 04/21/20   [name], MD  pregabalin (LYRICA) 50 MG capsule Take by mouth. 05/19/17   [provider]  PRESCRIPTION MEDICATION Allergy injections once weekly    [provider]  raloxifene (EVISTA) 60 MG tablet  12/07/15   [provider]  tiZANidine (ZANAFLEX) 4 MG capsule Take 4 mg by mouth as needed.     [provider]  vitamin C (ASCORBIC ACID) 500 MG tablet Take by mouth.    [provider]  zolpidem (AMBIEN) 10 MG tablet Take 1 tablet (10 mg total) by mouth at bedtime. 06/16/20 09/14/20  [name],  MD    Family History  Problem Relation Age of Onset  . Heart disease Mother   . Cancer Father        colon  . Heart disease Brother   . Diabetes Maternal Aunt   . Asthma Paternal Aunt   . Bladder Cancer Maternal Aunt   . Ovarian cancer Neg Hx   . Kidney cancer Neg Hx   . Prostate cancer Neg Hx      Social History   Tobacco Use  . Smoking status: Former Smoker    Packs/day: 1.00    Years: 15.00  Pack years: 15.00    Quit date: 11/25/1985    Years since quitting: 34.8  . Smokeless tobacco: Never Used  Vaping Use  . Vaping Use: Never used  Substance Use Topics  . Alcohol use: No  . Drug use: No    Allergies as of 09/08/2020 - Review Complete 09/06/2020  Allergen Reaction Noted  . Pollen extract Other (See Comments) 11/09/2012  . Erythromycin Other (See Comments) 08/06/2020  . Ether  11/26/2015  . Gramineae pollens Other (See Comments) 11/09/2012  . Montelukast sodium Other (See Comments) 08/06/2020  . Penicillins Hives 04/09/2014  . Tamsulosin  05/01/2017  . Band-aid plus antibiotic [bacitracin-polymyxin b] Rash 11/27/2015    Review of Systems:    All systems reviewed and negative except where noted in HPI.   Physical Exam:  There were no vitals taken for this visit. No LMP recorded. Patient is postmenopausal. General:   Alert,  Well-developed, well-nourished, pleasant and cooperative in NAD Head:  Normocephalic and atraumatic. Eyes:  Sclera clear, no icterus.   Conjunctiva pink. Ears:  Normal auditory acuity. Neck:  Supple; no masses or thyromegaly. Lungs:  Respirations even and unlabored.  Clear throughout to auscultation.   No wheezes, crackles, or rhonchi. No acute distress. Heart:  Regular rate and rhythm; no murmurs, clicks, rubs, or gallops. Abdomen:  Normal bowel sounds.  No bruits.  Soft, non-tender and non-distended without masses, hepatosplenomegaly or hernias noted.  No guarding or rebound tenderness.  Negative Carnett sign.   Rectal:  Deferred.   Pulses:  Normal pulses noted. Extremities:  No clubbing or edema.  No cyanosis. Neurologic:  Alert and oriented x3;  grossly normal neurologically. Skin:  Intact without significant lesions or rashes.  No jaundice. Lymph Nodes:  No significant cervical adenopathy. Psych:  Alert and cooperative. Normal mood and affect.  Imaging Studies: VAS [place] ABI WITH/WO TBI  Result Date: 08/28/2020 LOWER EXTREMITY DOPPLER STUDY Indications: Left shin wound slow healing.  Comparison Study: 07/28/2017 Performing Technologist: Concha Norway RVT  Examination Guidelines: A complete evaluation includes at minimum, Doppler waveform signals and systolic blood pressure reading at the level of bilateral brachial, anterior tibial, and posterior tibial arteries, when vessel segments are accessible. Bilateral testing is considered an integral part of a complete examination. Photoelectric Plethysmograph (PPG) waveforms and toe systolic pressure readings are included as required and additional duplex testing as needed. Limited examinations for reoccurring indications may be performed as noted.  ABI Findings: +---------+------------------+-----+---------+--------+ Right    Rt Pressure (mmHg)IndexWaveform Comment  +---------+------------------+-----+---------+--------+ Brachial 161                                      +---------+------------------+-----+---------+--------+ ATA      166               1.03 triphasic         +---------+------------------+-----+---------+--------+ PTA      182               1.13 triphasic         +---------+------------------+-----+---------+--------+ Cleotis Nipper               0.79 Normal            +---------+------------------+-----+---------+--------+ +---------+------------------+-----+---------+-------+ Left     Lt Pressure (mmHg)IndexWaveform Comment +---------+------------------+-----+---------+-------+ ATA      167               1.04 triphasic         +---------+------------------+-----+---------+-------+  PTA      176               1.09 triphasic        +---------+------------------+-----+---------+-------+ Great Toe136               0.84 Normal           +---------+------------------+-----+---------+-------+ +-------+-----------+-----------+------------+------------+ ABI/TBIToday's ABIToday's TBIPrevious ABIPrevious TBI +-------+-----------+-----------+------------+------------+ Right  1.13       127        1.31        .98          +-------+-----------+-----------+------------+------------+ Left   1.09       .84        1.29        .98          +-------+-----------+-----------+------------+------------+  Summary: Right: Resting right ankle-brachial index is within normal range. No evidence of significant right lower extremity arterial disease. The right toe-brachial index is normal. Left: Resting left ankle-brachial index is within normal range. No evidence of significant left lower extremity arterial disease. The left toe-brachial index is normal.  *See table(s) above for measurements and observations.  Electronically signed by Leotis Pain MD on 08/28/2020 at 12:26:13 PM.   Final    VAS [place] LOWER EXTREMITY VENOUS REFLUX  Result Date: 08/28/2020  Lower Venous Reflux Study Other Indications: VVI. Comparison Study: 2019 Performing Technologist: Concha Norway RVT  Examination Guidelines: A complete evaluation includes B-mode imaging, spectral Doppler, color Doppler, and power Doppler as needed of all accessible portions of each vessel. Bilateral testing is considered an integral part of a complete examination. Limited examinations for reoccurring indications may be performed as noted. The reflux portion of the exam is performed with the patient in reverse Trendelenburg. Significant venous reflux is defined as >500 ms in the superficial venous system, and >1 second in the deep venous system.  Venous Reflux Times  +--------------+---------+------+-----------+------------+--------+ RIGHT         Reflux NoRefluxReflux TimeDiameter cmsComments                         Yes                                  +--------------+---------+------+-----------+------------+--------+ GSV prox thigh          yes    >500 ms      .51              +--------------+---------+------+-----------+------------+--------+ GSV mid thigh           yes    >500 ms      .54              +--------------+---------+------+-----------+------------+--------+ GSV dist thigh          yes    >500 ms      .52              +--------------+---------+------+-----------+------------+--------+  +--------------+---------+------+-----------+------------+--------+ LEFT          Reflux NoRefluxReflux TimeDiameter cmsComments                         Yes                                  +--------------+---------+------+-----------+------------+--------+ GSV at [facility]  yes    >500 ms      .74              +--------------+---------+------+-----------+------------+--------+ GSV prox thigh          yes    >500 ms      .59              +--------------+---------+------+-----------+------------+--------+ GSV mid thigh           yes    >500 ms      .56              +--------------+---------+------+-----------+------------+--------+ GSV dist thigh          yes    >500 ms      .56              +--------------+---------+------+-----------+------------+--------+ GSV at knee             yes    >500 ms      .66              +--------------+---------+------+-----------+------------+--------+ GSV prox calf           yes    >500 ms      .73              +--------------+---------+------+-----------+------------+--------+   Summary: Bilateral: - No evidence of deep vein thrombosis seen in the lower extremities, bilaterally, from the common femoral through the popliteal veins. - No evidence of  superficial venous thrombosis in the lower extremities, bilaterally. - No evidence of deep venous insufficiency seen bilaterally in the lower extremity.  Right: - Venous reflux is noted in the right greater saphenous vein in the thigh.  Left: - Venous reflux is noted in the left greater saphenous vein in the thigh. - Venous reflux is noted in the left greater saphenous vein in the calf.  *See table(s) above for measurements and observations. Electronically signed by Leotis Pain MD on 08/28/2020 at 12:26:18 PM.    Final     Assessment and Plan:   [name] is a 75 y.o. y/o female who comes in today with a history of dysphagia and was recommended at her previous colonoscopy to have a repeat cause.  5 years.  The last colonoscopy was in 2015. The patient will be set up for an EGD and colonoscopy to look for a source of her dysphagia and unexplained weight loss.  The patient is due for repeat colonoscopy because a reported history of a first-degree relative with colon cancer.  The patient has been explained the plan and agrees with it.    Lucilla Lame, MD. Marval Regal    Note: This dictation was prepared with Dragon dictation along with smaller phrase technology. Any transcriptional errors that result from this process are unintentional.

## 2020-09-11 ENCOUNTER — Encounter: Payer: Medicare Other | Admitting: Physician Assistant

## 2020-09-11 ENCOUNTER — Other Ambulatory Visit: Payer: Self-pay

## 2020-09-11 DIAGNOSIS — J3089 Other allergic rhinitis: Secondary | ICD-10-CM | POA: Diagnosis not present

## 2020-09-11 DIAGNOSIS — J301 Allergic rhinitis due to pollen: Secondary | ICD-10-CM | POA: Diagnosis not present

## 2020-09-11 DIAGNOSIS — L97822 Non-pressure chronic ulcer of other part of left lower leg with fat layer exposed: Secondary | ICD-10-CM | POA: Diagnosis not present

## 2020-09-11 DIAGNOSIS — J3081 Allergic rhinitis due to animal (cat) (dog) hair and dander: Secondary | ICD-10-CM | POA: Diagnosis not present

## 2020-09-11 DIAGNOSIS — I872 Venous insufficiency (chronic) (peripheral): Secondary | ICD-10-CM | POA: Diagnosis not present

## 2020-09-11 NOTE — Progress Notes (Signed)
Cindy, [name] ([id number]) Visit Report for 09/11/2020 Arrival Information Details Patient Name: [name], [name]. Date of Service: 09/11/2020 1:30 PM Medical Record Number: [id number] Patient Account Number: [id number] Date of Birth/Sex: 08-26-1945 (75 y.o. F) Treating RN: [name] Primary Care Nezar Buckles: [name] Other Clinician: Referring Alexxia Stankiewicz: [name] Treating Jomel Whittlesey/Extender: [name] in Treatment: 10 Visit Information History Since Last Visit Pain Present Now: No Patient Arrived: Ambulatory Arrival Time: 13:38 Accompanied By: self Transfer Assistance: None Patient Identification Verified: Yes Secondary Verification Process Completed: Yes Electronic Signature(s) Signed: 09/11/2020 4:20:36 PM By: [name], [name] Entered By: [name], Minus Breeding on 09/11/2020 13:41:04 Sherwin, Mikaia CMarland Kitchen ([id number]) -------------------------------------------------------------------------------- Clinic Level of Care Assessment Details Patient Name: [name], [name] Date of Service: 09/11/2020 1:30 PM Medical Record Number: [id number] Patient Account Number: [id number] Date of Birth/Sex: Oct 08, 1945 (75 y.o. F) Treating RN: [name] Primary Care Linkyn Gobin: [name] Other Clinician: Referring Neyra Pettie: [name] Treating Narek Kniss/Extender: [name] in Treatment: 10 Clinic Level of Care Assessment Items TOOL 4 Quantity Score X - Use when only an EandM is performed on FOLLOW-UP visit 1 0 ASSESSMENTS - Nursing Assessment / Reassessment X - Reassessment of Co-morbidities (includes updates in patient status) 1 10 X- 1 5 Reassessment of Adherence to Treatment Plan ASSESSMENTS - Wound and Skin Assessment / Reassessment X - Simple Wound Assessment / Reassessment - one wound 1 5 []  - 0 Complex Wound Assessment / Reassessment - multiple wounds []  - 0 Dermatologic / Skin Assessment (not related to wound area) ASSESSMENTS -  Focused Assessment []  - Circumferential Edema Measurements - multi extremities 0 []  - 0 Nutritional Assessment / Counseling / Intervention []  - 0 Lower Extremity Assessment (monofilament, tuning fork, pulses) []  - 0 Peripheral Arterial Disease Assessment (using hand held doppler) ASSESSMENTS - Ostomy and/or Continence Assessment and Care []  - Incontinence Assessment and Management 0 []  - 0 Ostomy Care Assessment and Management (repouching, etc.) PROCESS - Coordination of Care X - Simple Patient / Family Education for ongoing care 1 15 []  - 0 Complex (extensive) Patient / Family Education for ongoing care []  - 0 Staff obtains Programmer, systems, Records, Test Results / Process Orders []  - 0 Staff telephones HHA, Nursing Homes / Clarify orders / etc []  - 0 Routine Transfer to another Facility (non-emergent condition) []  - 0 Routine Hospital Admission (non-emergent condition) []  - 0 New Admissions / Biomedical engineer / Ordering NPWT, Apligraf, etc. []  - 0 Emergency Hospital Admission (emergent condition) X- 1 10 Simple Discharge Coordination []  - 0 Complex (extensive) Discharge Coordination PROCESS - Special Needs []  - Pediatric / Minor Patient Management 0 []  - 0 Isolation Patient Management []  - 0 Hearing / Language / Visual special needs []  - 0 Assessment of Community assistance (transportation, D/C planning, etc.) []  - 0 Additional assistance / Altered mentation []  - 0 Support Surface(s) Assessment (bed, cushion, seat, etc.) INTERVENTIONS - Wound Cleansing / Measurement Budnick, Dakiyah C. ([id number]) X- 1 5 Simple Wound Cleansing - one wound []  - 0 Complex Wound Cleansing - multiple wounds X- 1 5 Wound Imaging (photographs - any number of wounds) []  - 0 Wound Tracing (instead of photographs) X- 1 5 Simple Wound Measurement - one wound []  - 0 Complex Wound Measurement - multiple wounds INTERVENTIONS - Wound Dressings []  - Small Wound Dressing one or multiple  wounds 0 X- 1 15 Medium Wound Dressing one or multiple wounds []  - 0 Large Wound Dressing one or multiple wounds []  - 0 Application  of Medications - topical []  - 0 Application of Medications - injection INTERVENTIONS - Miscellaneous []  - External ear exam 0 []  - 0 Specimen Collection (cultures, biopsies, blood, body fluids, etc.) []  - 0 Specimen(s) / Culture(s) sent or taken to Lab for analysis []  - 0 Patient Transfer (multiple staff / Civil Service fast streamer / Similar devices) []  - 0 Simple Staple / Suture removal (25 or less) []  - 0 Complex Staple / Suture removal (26 or more) []  - 0 Hypo / Hyperglycemic Management (close monitor of Blood Glucose) []  - 0 Ankle / Brachial Index (ABI) - do not check if billed separately X- 1 5 Vital Signs Has the patient been seen at the hospital within the last three years: Yes Total Score: 80 Level Of Care: New/Established - Level 3 Electronic Signature(s) Signed: 09/11/2020 4:20:36 PM By: [name], [name] Entered By: [name], Kenia on 09/11/2020 14:11:35 Moen, Alayne CMarland Kitchen ([id number]) -------------------------------------------------------------------------------- Encounter Discharge Information Details Patient Name: Cindy Loop, [name] Date of Service: 09/11/2020 1:30 PM Medical Record Number: [id number] Patient Account Number: [id number] Date of Birth/Sex: [birth year] (75 y.o. F) Treating RN: [name] Primary Care Jasaun Carn: [name] Other Clinician: Referring Warrene Kapfer: [name] Treating Kylee Nardozzi/Extender: [name] in Treatment: 10 Encounter Discharge Information Items Discharge Condition: Stable Ambulatory Status: Ambulatory Discharge Destination: Home Transportation: Private Auto Accompanied By: self Schedule Follow-up Appointment: Yes Clinical Summary of Care: Electronic Signature(s) Signed: 09/11/2020 4:20:36 PM By: [name], [name] Entered By: [name], Minus Breeding on 09/11/2020  14:12:10 Southard, Zameria C. ([id number]) -------------------------------------------------------------------------------- Lower Extremity Assessment Details Patient Name: Novello, Eulanda C. Date of Service: 09/11/2020 1:30 PM Medical Record Number: [id number] Patient Account Number: [id number] Date of Birth/Sex: March 22, 1946 (74 y.o. F) Treating RN: [name] Primary Care Wayne Brunker: [name] Other Clinician: Referring Arnette Driggs: [name] Treating Geovanni Rahming/Extender: Jeri Cos Weeks in Treatment: 10 Edema Assessment Assessed: [Left: Yes] [Right: No] Edema: [Left: N] [Right: o] Calf Left: Right: Point of Measurement: 32 cm From Medial Instep 36.5 cm Ankle Left: Right: Point of Measurement: 10 cm From Medial Instep 22.5 cm Vascular Assessment Pulses: Dorsalis Pedis Palpable: [Left:Yes] Electronic Signature(s) Signed: 09/11/2020 4:20:36 PM By: [name], [name] Entered By: [name], Kenia on 09/11/2020 13:49:22 Mcwethy, Dawnette C. ([id number]) -------------------------------------------------------------------------------- Multi Wound Chart Details Patient Name: Cindy Loop, Danessa C. Date of Service: 09/11/2020 1:30 PM Medical Record Number: [id number] Patient Account Number: [id number] Date of Birth/Sex: February 09, 1946 (74 y.o. F) Treating RN: [name] Primary Care Teegan Brandis: [name] Other Clinician: Referring Kennia Vanvorst: [name] Treating Aurea Aronov/Extender: [name] in Treatment: 10 Vital Signs Height(in): 61 Pulse(bpm): 16 Weight(lbs): 175 Blood Pressure(mmHg): 128/74 Body Mass Index(BMI): 33 Temperature(F): 98.6 Respiratory Rate(breaths/min): 18 Photos: [N/A:N/A] Wound Location: Left, Anterior Lower Leg N/A N/A Wounding Event: Puncture N/A N/A Primary Etiology: Skin Tear N/A N/A Comorbid History: Cataracts, Asthma, Hypertension, N/A N/A Osteoarthritis, Received Radiation Date Acquired: 05/18/2020 N/A N/A Weeks of  Treatment: 10 N/A N/A Wound Status: Open N/A N/A Measurements L x W x D (cm) 0.4x0.4x0.2 N/A N/A Area (cm) : 0.126 N/A N/A Volume (cm) : 0.025 N/A N/A % Reduction in Area: 91.80% N/A N/A % Reduction in Volume: 83.70% N/A N/A Position 1 (o'clock): 11 Maximum Distance 1 (cm): 0.6 Tunneling: Yes N/A N/A Classification: Full Thickness Without Exposed N/A N/A Support Structures Exudate Amount: Medium N/A N/A Exudate Type: Serous N/A N/A Exudate Color: amber N/A N/A Wound Margin: Flat and Intact N/A N/A Granulation Amount: Large (67-100%) N/A N/A Granulation Quality: Pink, Pale N/A N/A  Necrotic Amount: Small (1-33%) N/A N/A Exposed Structures: Fat Layer (Subcutaneous Tissue): N/A N/A Yes Fascia: No Tendon: No Muscle: No Joint: No Bone: No Epithelialization: None N/A N/A Treatment Notes Electronic Signature(s) Signed: 09/11/2020 4:20:36 PM By: [name], [name] Entered By: [name], Minus Breeding on 09/11/2020 13:57:03 Giaimo, AZAYLA POLO ([id number]) LAYSHA, CHILDERS ([id number]) -------------------------------------------------------------------------------- Multi-Disciplinary Care Plan Details Patient Name: Cindy Loop, Marsena C. Date of Service: 09/11/2020 1:30 PM Medical Record Number: [id number] Patient Account Number: [id number] Date of Birth/Sex: [birth year] (74 y.o. F) Treating RN: [name] Primary Care Kaylan Yates: [name] Other Clinician: Referring Liset Mcmonigle: [name] Treating Braylea Brancato/Extender: [name] in Treatment: 10 Active Inactive Wound/Skin Impairment Nursing Diagnoses: Impaired tissue integrity Goals: Ulcer/skin breakdown will have a volume reduction of 30% by week 4 Date Initiated: 07/03/2020 Date Inactivated: 08/28/2020 Target Resolution Date: 07/31/2020 Goal Status: Unmet Unmet Reason: comorbities Ulcer/skin breakdown will have a volume reduction of 50% by week 8 Date Initiated: 08/28/2020 Target Resolution Date:  09/25/2020 Goal Status: Active Interventions: Assess ulceration(s) every visit Notes: Electronic Signature(s) Signed: 09/11/2020 4:20:36 PM By: [name], [name] Entered By: [name], Minus Breeding on 09/11/2020 13:56:39 Byrer, Kyrene C. ([id number]) -------------------------------------------------------------------------------- Pain Assessment Details Patient Name: Cindy Loop, Jimeka C. Date of Service: 09/11/2020 1:30 PM Medical Record Number: [id number] Patient Account Number: [id number] Date of Birth/Sex: 09-18-1945 (74 y.o. F) Treating RN: [name] Primary Care Danial Hlavac: [name] Other Clinician: Referring Keeton Kassebaum: [name] Treating Bayler Gehrig/Extender: [name] in Treatment: 10 Active Problems Location of Pain Severity and Description of Pain Patient Has Paino No Site Locations Rate the pain. Current Pain Level: 0 Pain Management and Medication Current Pain Management: Electronic Signature(s) Signed: 09/11/2020 4:20:36 PM By: [name], [name] Entered By: [name], Kenia on 09/11/2020 13:42:12 Lacour, Tyjai C. ([id number]) -------------------------------------------------------------------------------- Wound Assessment Details Patient Name: Prescher, Tieshia C. Date of Service: 09/11/2020 1:30 PM Medical Record Number: [id number] Patient Account Number: [id number] Date of Birth/Sex: Jul 31, 1945 (74 y.o. F) Treating RN: [name] Primary Care Leonarda Leis: [name] Other Clinician: Referring Bravery Ketcham: [name] Treating Norvel Wenker/Extender: [name] in Treatment: 10 Wound Status Wound Number: 1 Primary Skin Tear Etiology: Wound Location: Left, Anterior Lower Leg Wound Status: Open Wounding Event: Puncture Comorbid Cataracts, Asthma, Hypertension, Osteoarthritis, Date Acquired: 05/18/2020 History: Received Radiation Weeks Of Treatment: 10 Clustered Wound: No Photos Wound Measurements Length: (cm)  0.4 Width: (cm) 0.4 Depth: (cm) 0.2 Area: (cm) 0.126 Volume: (cm) 0.025 % Reduction in Area: 91.8% % Reduction in Volume: 83.7% Epithelialization: None Tunneling: Yes Position (o'clock): 11 Maximum Distance: (cm) 0.6 Undermining: No Wound Description Classification: Full Thickness Without Exposed Support Structu Wound Margin: Flat and Intact Exudate Amount: Medium Exudate Type: Serous Exudate Color: amber res Foul Odor After Cleansing: No Slough/Fibrino Yes Wound Bed Granulation Amount: Large (67-100%) Exposed Structure Granulation Quality: Pink, Pale Fascia Exposed: No Necrotic Amount: Small (1-33%) Fat Layer (Subcutaneous Tissue) Exposed: Yes Necrotic Quality: Adherent Slough Tendon Exposed: No Muscle Exposed: No Joint Exposed: No Bone Exposed: No Treatment Notes Wound #1 (Lower Leg) Wound Laterality: Left, Anterior Cleanser Normal Saline Erby, Julie-Ann C. ([id number]) Discharge Instruction: Wash your hands with soap and water. Remove old dressing, discard into plastic bag and place into trash. Cleanse the wound with Normal Saline prior to applying a clean dressing using gauze sponges, not tissues or cotton balls. Do not scrub or use excessive force. Pat dry using gauze sponges, not tissue or cotton balls. Peri-Wound Care Topical Primary Dressing Endoform Secondary Dressing Non-Adherent Pad 3x8 (in/in)  Secured With [employer], 1x10 (in/yd) Compression Wrap Compression Stockings Add-Ons Electronic Signature(s) Signed: 09/11/2020 4:20:36 PM By: [name], [name] Entered By: [name], Kenia on 09/11/2020 13:48:11 Roots, Janeen CMarland Kitchen ([id number]) -------------------------------------------------------------------------------- Vitals Details Patient Name: [name] Date of Service: 09/11/2020 1:30 PM Medical Record Number: [id number] Patient Account Number: [id number] Date of Birth/Sex: Apr 13, 1946 (74 y.o. F) Treating RN:  [name] Primary Care Kairav Russomanno: [name] Other Clinician: Referring Jakevious Hollister: [name] Treating Oline Belk/Extender: [name] in Treatment: 10 Vital Signs Time Taken: 13:40 Temperature (F): 98.6 Height (in): 61 Pulse (bpm): 71 Weight (lbs): 175 Respiratory Rate (breaths/min): 18 Body Mass Index (BMI): 33.1 Blood Pressure (mmHg): 128/74 Reference Range: 80 - 120 mg / dl Electronic Signature(s) Signed: 09/11/2020 4:20:36 PM By: [name], [name] Entered By: [name], Minus Breeding on 09/11/2020 13:41:55

## 2020-09-11 NOTE — Progress Notes (Signed)
MARGAREE, SANDHU ([id number]) Visit Report for 09/11/2020 Chief Complaint Document Details Patient Name: [name], [name]. Date of Service: 09/11/2020 1:30 PM Medical Record Number: [id number] Patient Account Number: [id number] Date of Birth/Sex: 1945/10/24 (74 y.o. F) Treating RN: Cornell Barman Primary Care Provider: [name] Other Clinician: Referring Provider: [name] Treating Provider/Extender: [name] in Treatment: 10 Information Obtained from: Patient Chief Complaint Left LE Ulcer Electronic Signature(s) Signed: 09/11/2020 1:51:23 PM By: Worthy Keeler PA-[name] Entered By: Worthy Keeler on 09/11/2020 13:51:23 [name], Cindy CMarland Kitchen ([id number]) -------------------------------------------------------------------------------- HPI Details Patient Name: Cindy Ohm [name]. Date of Service: 09/11/2020 1:30 PM Medical Record Number: [id number] Patient Account Number: [id number] Date of Birth/Sex: Jun 15, 1946 (74 y.o. F) Treating RN: Cornell Barman Primary Care Provider: [name] Other Clinician: Referring Provider: [name] Treating Provider/Extender: [name] in Treatment: 10 History of Present Illness HPI Description: 07/03/2020 upon evaluation today patient appears to be doing unfortunately somewhat poorly in regard to her leg where she struck this causing a wound 05/18/2020. She states that since that time she has had mainly an eschar covering sometimes it will drain sometimes not its been infected a couple times as well and she has been on several antibiotics including 5 days left of doxycycline that she is currently taking. No sharp debridement has been undertaken by anyone at this point. She tells me that she does have a history of leg swelling she is supposed to wear compression socks but she does not always do this. She also tells me that she does have hypertension but otherwise no major medical problems. She has not really been doing anything  compression wise since this was injured in October. 07/10/2020 on evaluation today patient actually appears to be making some progress here. Fortunately there is no signs of active infection systemically locally she still has some evidence of erythema infection although this is minimal compared to what it was even last week. Overall I am pleased in that regard. There does not appear to be any signs of active infection spreading outside aware of marked that is good news as well. In general the patient seems to be doing well although I think we already continue to probably treat her we likely need to switch her to Bactrim DS as the doxycycline is resistant that is what she was taking as of last week. 07/17/2020 upon evaluation today patient appears to be doing excellent in regard to her leg ulcer. In fact I think we can probably switch to collagen-based dressing at this point as she seems to be doing so well. There is no evidence of infection at this time and I think that she is making good progress. I do want her to continue with the oral antibiotics at this point. 07/31/2020 upon evaluation today patient appears to be doing quite well in regard to her lower extremity ulcer. She has been tolerating the dressing changes without complication. Fortunately there is no evidence of active infection at this time. I been very pleased with where things stand currently. No fevers, chills, nausea, vomiting, or diarrhea. 08/07/2020 on evaluation today patient appears to continue to make signs of good improvement with regard to her wound. I am very pleased with where things stand this is measuring smaller and overall I think she is doing excellent. 08/14/2020 upon evaluation today patient appears to be doing well in regard to her leg ulcer. She has been tolerating the dressing changes without complication. Fortunately there is no signs of active infection at  this time which is also great news. No fevers, chills,  nausea, vomiting, or diarrhea. 08/21/2020 on evaluation today patient appears to be doing well with regard to her wound in general. With that being said she does have some issues currently with still a small area that had a little bit more depth roughly at the 11:00 location upon inspection. There is a little bit of undermining/tunneling here. With that being said I think we may want to try a little bit different dressing to see if this can be beneficial for her I am thinking endoform. 08/28/2020 upon evaluation today patient appears to be making a little bit of progress. This is measuring a little bit better I do believe the endoform has been beneficial for her. I did keep the small hole open and I do feel like that is showing some signs of improvement all of which is great news. 09/04/2020 upon evaluation today patient appears to be doing well with regard to her leg ulcer. She has been tolerating the dressing changes without complication. Fortunately there is no evidence of active infection at this time. No fevers, chills, nausea, vomiting, or diarrhea. 09/11/2020 upon evaluation today patient appears to be doing well with regard to her wound currently. She is showing signs of improvement. Fortunately there is no evidence of active infection at this time. No fevers, chills, nausea, vomiting, or diarrhea. Overall I think that she is making good progress this is just taking this time as far as feeling in the small/deeper area of the wound. Electronic Signature(s) Signed: 09/11/2020 2:00:05 PM By: Worthy Keeler PA-[name] Entered By: Worthy Keeler on 09/11/2020 14:00:05 [name], Cindy CMarland Kitchen ([id number]) -------------------------------------------------------------------------------- Physical Exam Details Patient Name: [name], [name]. Date of Service: 09/11/2020 1:30 PM Medical Record Number: [id number] Patient Account Number: [id number] Date of Birth/Sex: Aug 04, 1945 (74 y.o. F) Treating RN: Cornell Barman Primary Care Provider: [name] Other Clinician: Referring Provider: [name] Treating Provider/Extender: [name] in Treatment: 32 Constitutional Well-nourished and well-hydrated in no acute distress. Respiratory normal breathing without difficulty. Psychiatric this patient is able to make decisions and demonstrates good insight into disease process. Alert and Oriented x 3. pleasant and cooperative. Notes Upon inspection patient's wound bed actually showed signs of good granulation epithelization at this point. We have been using a little bit of gentamicin followed by the endoform which I think has done well for her. I think we can continue as such. Electronic Signature(s) Signed: 09/11/2020 2:00:22 PM By: Worthy Keeler PA-[name] Entered By: Worthy Keeler on 09/11/2020 14:00:21 [name], [name] ([id number]) -------------------------------------------------------------------------------- Physician Orders Details Patient Name: Cindy Ohm [name]. Date of Service: 09/11/2020 1:30 PM Medical Record Number: [id number] Patient Account Number: [id number] Date of Birth/Sex: 12-26-1945 (74 y.o. F) Treating RN: [name] Primary Care Provider: [name] Other Clinician: Referring Provider: [name] Treating Provider/Extender: [name] in Treatment: 10 Verbal / Phone Orders: No Diagnosis Coding ICD-10 Coding Code Description I87.2 Venous insufficiency (chronic) (peripheral) S81.802A Unspecified open wound, left lower leg, initial encounter L97.822 Non-pressure chronic ulcer of other part of left lower leg with fat layer exposed [place] (primary) hypertension Follow-up Appointments o Return Appointment in 1 week. Bathing/ Shower/ Hygiene o May shower; gently cleanse wound with antibacterial soap, rinse and pat dry prior to dressing wounds Edema Control - Lymphedema / Segmental Compressive Device / Other Bilateral Lower  Extremities o Patient to wear own compression stockings. Remove compression stockings every night before going to bed and put  on every morning when getting up. o Elevate, Exercise Daily and Avoid Standing for Long Periods of Time. Wound Treatment Wound #1 - Lower Leg Wound Laterality: Left, Anterior Cleanser: Normal Saline 3 x Per Week/30 Days Discharge Instructions: Wash your hands with soap and water. Remove old dressing, discard into plastic bag and place into trash. Cleanse the wound with Normal Saline prior to applying a clean dressing using gauze sponges, not tissues or cotton balls. Do not scrub or use excessive force. Pat dry using gauze sponges, not tissue or cotton balls. Primary Dressing: Endoform 3 x Per Week/30 Days Secondary Dressing: Non-Adherent Pad 3x8 (in/in) 3 x Per Week/30 Days Secured With: Curad Paper Tape, 1x10 (in/yd) 3 x Per Week/30 Days Patient Medications Allergies: penicillin, latex Notifications Medication Indication Start End gentamicin 09/11/2020 DOSE topical 0.1 % cream - cream topical applied in a thin film to the wound bed 3 times per week until healed under the dressing. Electronic Signature(s) Signed: 09/11/2020 2:05:29 PM By: Worthy Keeler PA-[name] Entered By: Worthy Keeler on 09/11/2020 14:05:29 [name], Cindy CMarland Kitchen ([id number]) -------------------------------------------------------------------------------- Problem List Details Patient Name: [name], [name]. Date of Service: 09/11/2020 1:30 PM Medical Record Number: [id number] Patient Account Number: [id number] Date of Birth/Sex: [birth year] (74 y.o. F) Treating RN: Cornell Barman Primary Care Provider: [name] Other Clinician: Referring Provider: [name] Treating Provider/Extender: [name] in Treatment: 10 Active Problems ICD-10 Encounter Code Description Active Date MDM Diagnosis I87.2 Venous insufficiency (chronic) (peripheral) 07/03/2020 No Yes S81.802A Unspecified  open wound, left lower leg, initial encounter 07/03/2020 No Yes L97.822 Non-pressure chronic ulcer of other part of left lower leg with fat layer 07/03/2020 No Yes exposed [place] (primary) hypertension 07/03/2020 No Yes Inactive Problems Resolved Problems Electronic Signature(s) Signed: 09/11/2020 1:51:18 PM By: Worthy Keeler PA-[name] Entered By: Worthy Keeler on 09/11/2020 13:51:18 [name], [name]. ([id number]) -------------------------------------------------------------------------------- Progress Note Details Patient Name: [name], [name]. Date of Service: 09/11/2020 1:30 PM Medical Record Number: [id number] Patient Account Number: [id number] Date of Birth/Sex: 1945-12-08 (74 y.o. F) Treating RN: Cornell Barman Primary Care Provider: [name] Other Clinician: Referring Provider: [name] Treating Provider/Extender: [name] in Treatment: 10 Subjective Chief Complaint Information obtained from Patient Left LE Ulcer History of Present Illness (HPI) 07/03/2020 upon evaluation today patient appears to be doing unfortunately somewhat poorly in regard to her leg where she struck this causing a wound 05/18/2020. She states that since that time she has had mainly an eschar covering sometimes it will drain sometimes not its been infected a couple times as well and she has been on several antibiotics including 5 days left of doxycycline that she is currently taking. No sharp debridement has been undertaken by anyone at this point. She tells me that she does have a history of leg swelling she is supposed to wear compression socks but she does not always do this. She also tells me that she does have hypertension but otherwise no major medical problems. She has not really been doing anything compression wise since this was injured in October. 07/10/2020 on evaluation today patient actually appears to be making some progress here. Fortunately there is no signs of active  infection systemically locally she still has some evidence of erythema infection although this is minimal compared to what it was even last week. Overall I am pleased in that regard. There does not appear to be any signs of active infection spreading outside aware of marked that is good news as well.  In general the patient seems to be doing well although I think we already continue to probably treat her we likely need to switch her to Bactrim DS as the doxycycline is resistant that is what she was taking as of last week. 07/17/2020 upon evaluation today patient appears to be doing excellent in regard to her leg ulcer. In fact I think we can probably switch to collagen-based dressing at this point as she seems to be doing so well. There is no evidence of infection at this time and I think that she is making good progress. I do want her to continue with the oral antibiotics at this point. 07/31/2020 upon evaluation today patient appears to be doing quite well in regard to her lower extremity ulcer. She has been tolerating the dressing changes without complication. Fortunately there is no evidence of active infection at this time. I been very pleased with where things stand currently. No fevers, chills, nausea, vomiting, or diarrhea. 08/07/2020 on evaluation today patient appears to continue to make signs of good improvement with regard to her wound. I am very pleased with where things stand this is measuring smaller and overall I think she is doing excellent. 08/14/2020 upon evaluation today patient appears to be doing well in regard to her leg ulcer. She has been tolerating the dressing changes without complication. Fortunately there is no signs of active infection at this time which is also great news. No fevers, chills, nausea, vomiting, or diarrhea. 08/21/2020 on evaluation today patient appears to be doing well with regard to her wound in general. With that being said she does have some issues  currently with still a small area that had a little bit more depth roughly at the 11:00 location upon inspection. There is a little bit of undermining/tunneling here. With that being said I think we may want to try a little bit different dressing to see if this can be beneficial for her I am thinking endoform. 08/28/2020 upon evaluation today patient appears to be making a little bit of progress. This is measuring a little bit better I do believe the endoform has been beneficial for her. I did keep the small hole open and I do feel like that is showing some signs of improvement all of which is great news. 09/04/2020 upon evaluation today patient appears to be doing well with regard to her leg ulcer. She has been tolerating the dressing changes without complication. Fortunately there is no evidence of active infection at this time. No fevers, chills, nausea, vomiting, or diarrhea. 09/11/2020 upon evaluation today patient appears to be doing well with regard to her wound currently. She is showing signs of improvement. Fortunately there is no evidence of active infection at this time. No fevers, chills, nausea, vomiting, or diarrhea. Overall I think that she is making good progress this is just taking this time as far as feeling in the small/deeper area of the wound. Objective Constitutional Well-nourished and well-hydrated in no acute distress. Vitals Time Taken: 1:40 PM, Height: 61 in, Weight: 175 lbs, BMI: 33.1, Temperature: 98.6 F, Pulse: 71 bpm, Respiratory Rate: 18 breaths/min, Blood Pressure: 128/74 mmHg. [place] ([id number]) Respiratory normal breathing without difficulty. Psychiatric this patient is able to make decisions and demonstrates good insight into disease process. Alert and Oriented x 3. pleasant and cooperative. General Notes: Upon inspection patient's wound bed actually showed signs of good granulation epithelization at this point. We have been using a little bit of  gentamicin followed by the  endoform which I think has done well for her. I think we can continue as such. Integumentary (Hair, Skin) Wound #1 status is Open. Original cause of wound was Puncture. The wound is located on the Left,Anterior Lower Leg. The wound measures 0.4cm length x 0.4cm width x 0.2cm depth; 0.126cm^2 area and 0.025cm^3 volume. There is Fat Layer (Subcutaneous Tissue) exposed. There is no undermining noted, however, there is tunneling at 11:00 with a maximum distance of 0.6cm. There is a medium amount of serous drainage noted. The wound margin is flat and intact. There is large (67-100%) pink, pale granulation within the wound bed. There is a small (1-33%) amount of necrotic tissue within the wound bed including Adherent Slough. Assessment Active Problems ICD-10 Venous insufficiency (chronic) (peripheral) Unspecified open wound, left lower leg, initial encounter Non-pressure chronic ulcer of other part of left lower leg with fat layer exposed Essential (primary) hypertension Plan Follow-up Appointments: Return Appointment in 1 week. Bathing/ Shower/ Hygiene: May shower; gently cleanse wound with antibacterial soap, rinse and pat dry prior to dressing wounds Edema Control - Lymphedema / Segmental Compressive Device / Other: Patient to wear own compression stockings. Remove compression stockings every night before going to bed and put on every morning when getting up. Elevate, Exercise Daily and Avoid Standing for Long Periods of Time. The following medication(s) was prescribed: gentamicin topical 0.1 % cream cream topical applied in a thin film to the wound bed 3 times per week until healed under the dressing. starting 09/11/2020 WOUND #1: - Lower Leg Wound Laterality: Left, Anterior Cleanser: Normal Saline 3 x Per Week/30 Days Discharge Instructions: Wash your hands with soap and water. Remove old dressing, discard into plastic bag and place into trash. Cleanse the wound  with Normal Saline prior to applying a clean dressing using gauze sponges, not tissues or cotton balls. Do not scrub or use excessive force. Pat dry using gauze sponges, not tissue or cotton balls. Primary Dressing: Endoform 3 x Per Week/30 Days Secondary Dressing: Non-Adherent Pad 3x8 (in/in) 3 x Per Week/30 Days Secured With: Curad Paper Tape, 1x10 (in/yd) 3 x Per Week/30 Days 1. Would recommend currently that we going continue with the wound care measures as before this includes the use of the small amount of gentamicin followed by endoform packed into the deeper section of the wound. 2. I am also can recommend that the patient continue to elevate her leg whenever she can. 3. I am also can recommend she continue with the compression stocking that does seem to be doing well for her. We will see patient back for reevaluation in 1 week here in the clinic. If anything worsens or changes patient will contact our office for additional recommendations. Electronic Signature(s) Signed: 09/11/2020 2:05:42 PM By: Worthy Keeler PA-[name] Entered By: Worthy Keeler on 09/11/2020 14:05:42 [name], [name] ([id number]) [name], [name] CMarland Kitchen ([id number]) -------------------------------------------------------------------------------- SuperBill Details Patient Name: Cindy Ohm [name]. Date of Service: 09/11/2020 Medical Record Number: [id number] Patient Account Number: [id number] Date of Birth/Sex: July 21, 1946 (74 y.o. 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 [place] (primary) hypertension Facility  Procedures CPT4 Code: [id number] Description: 99213 - WOUND CARE VISIT-LEV 3 EST PT Modifier: Quantity: 1 Physician  Procedures CPT4 Code: 4196222 Description: 97989 - WC PHYS LEVEL 4 - EST PT Modifier: Quantity: 1 CPT4 Code: Description: ICD-10 Diagnosis Description I87.2 Venous insufficiency (chronic) (peripheral) S81.802A Unspecified open wound, left lower leg, initial encounter L97.822 Non-pressure chronic ulcer of other part of left lower leg with fat lay I10 Essential  (primary) hypertension Modifier: er exposed Quantity: Electronic Signature(s) Unsigned Previous Signature: 09/11/2020 2:05:55 PM Version By: Worthy Keeler PA-[name] Entered By: [name], Minus Breeding on 09/11/2020 14:11:42 Signature(s): Date(s):

## 2020-09-15 ENCOUNTER — Other Ambulatory Visit: Payer: Self-pay

## 2020-09-15 ENCOUNTER — Other Ambulatory Visit: Payer: Medicare Other

## 2020-09-15 DIAGNOSIS — J301 Allergic rhinitis due to pollen: Secondary | ICD-10-CM | POA: Diagnosis not present

## 2020-09-15 DIAGNOSIS — J3089 Other allergic rhinitis: Secondary | ICD-10-CM | POA: Diagnosis not present

## 2020-09-17 NOTE — Progress Notes (Signed)
[facility] [place], [place] [address] Phone: [phone] Fax:  [phone]  Transthoracic Echocardiogram Note  BRITANIE HARSHMAN [id number] Nov 03, 1945  Procedure: Transthoracic Echocardiogram Indications:shortness of breath Verbal Consent: Obtained  Procedure Details   Technical quality: good  Resting Measurements: Within normal limits  Left Ventrical: Normal size, left ventricular ejection fraction is 50%.  Mitral Valve: Anterior and posterior mitral leaflets are normal.  Aortic Valve: Aortic valve is tricuspid and normal with minimal thickening  Tricuspid Valve: Tricuspid valve is normal  Pulmonic Valve: Not seen properly.  Left Atrium/ Left atrial appendage: Normal without any blood clot.  Atrial septum:   Aorta: Aortic root is normal.   Complications: No apparent complications Patient did tolerate procedure well.  [name], MD

## 2020-09-18 ENCOUNTER — Encounter: Payer: Medicare Other | Admitting: Physician Assistant

## 2020-09-18 ENCOUNTER — Other Ambulatory Visit: Payer: Self-pay

## 2020-09-18 DIAGNOSIS — L97822 Non-pressure chronic ulcer of other part of left lower leg with fat layer exposed: Secondary | ICD-10-CM | POA: Diagnosis not present

## 2020-09-18 DIAGNOSIS — I872 Venous insufficiency (chronic) (peripheral): Secondary | ICD-10-CM | POA: Diagnosis not present

## 2020-09-18 NOTE — Progress Notes (Addendum)
[name] ([id number]) Visit Report for 09/18/2020 Chief Complaint Document Details Patient Name: [name]. Date of Service: 09/18/2020 1:30 PM Medical Record Number: [id number] Patient Account Number: [id number] Date of Birth/Sex: 03/05/1946 (75 y.o. F) Treating RN: Carlene Coria Primary Care Provider: [name] Other Clinician: Referring Provider: [name] Treating Provider/Extender: [name] in Treatment: 11 Information Obtained from: Patient Chief Complaint Left LE Ulcer Electronic Signature(s) Signed: 09/18/2020 1:54:19 PM By: [name] Entered By: Worthy Keeler on 09/18/2020 13:54:17 Zegarra, [name] Kitchen ([id number]) -------------------------------------------------------------------------------- HPI Details Patient Name: Cindy Ohm C. Date of Service: 09/18/2020 1:30 PM Medical Record Number: [id number] Patient Account Number: [id number] Date of Birth/Sex: 09-29-1945 (75 y.o. F) Treating RN: Carlene Coria Primary Care Provider: [name] Other Clinician: Referring Provider: [name] Treating Provider/Extender: [name] in Treatment: 11 History of Present Illness HPI Description: 07/03/2020 upon evaluation today patient appears to be doing unfortunately somewhat poorly in regard to her leg where she struck this causing a wound 05/18/2020. She states that since that time she has had mainly an eschar covering sometimes it will drain sometimes not its been infected a couple times as well and she has been on several antibiotics including 5 days left of doxycycline that she is currently taking. No sharp debridement has been undertaken by anyone at this point. She tells me that she does have a history of leg swelling she is supposed to wear compression socks but she does not always do this. She also tells me that she does have hypertension but otherwise no major medical problems. She has not really been doing  anything compression wise since this was injured in October. 07/10/2020 on evaluation today patient actually appears to be making some progress here. Fortunately there is no signs of active infection systemically locally she still has some evidence of erythema infection although this is minimal compared to what it was even last week. Overall I am pleased in that regard. There does not appear to be any signs of active infection spreading outside aware of marked that is good news as well. In general the patient seems to be doing well although I think we already continue to probably treat her we likely need to switch her to Bactrim DS as the doxycycline is resistant that is what she was taking as of last week. 07/17/2020 upon evaluation today patient appears to be doing excellent in regard to her leg ulcer. In fact I think we can probably switch to collagen-based dressing at this point as she seems to be doing so well. There is no evidence of infection at this time and I think that she is making good progress. I do want her to continue with the oral antibiotics at this point. 07/31/2020 upon evaluation today patient appears to be doing quite well in regard to her lower extremity ulcer. She has been tolerating the dressing changes without complication. Fortunately there is no evidence of active infection at this time. I been very pleased with where things stand currently. No fevers, chills, nausea, vomiting, or diarrhea. 08/07/2020 on evaluation today patient appears to continue to make signs of good improvement with regard to her wound. I am very pleased with where things stand this is measuring smaller and overall I think she is doing excellent. 08/14/2020 upon evaluation today patient appears to be doing well in regard to her leg ulcer. She has been tolerating the dressing changes without complication. Fortunately there is no signs of active infection at  this time which is also great news. No fevers,  chills, nausea, vomiting, or diarrhea. 08/21/2020 on evaluation today patient appears to be doing well with regard to her wound in general. With that being said she does have some issues currently with still a small area that had a little bit more depth roughly at the 11:00 location upon inspection. There is a little bit of undermining/tunneling here. With that being said I think we may want to try a little bit different dressing to see if this can be beneficial for her I am thinking endoform. 08/28/2020 upon evaluation today patient appears to be making a little bit of progress. This is measuring a little bit better I do believe the endoform has been beneficial for her. I did keep the small hole open and I do feel like that is showing some signs of improvement all of which is great news. 09/04/2020 upon evaluation today patient appears to be doing well with regard to her leg ulcer. She has been tolerating the dressing changes without complication. Fortunately there is no evidence of active infection at this time. No fevers, chills, nausea, vomiting, or diarrhea. 09/11/2020 upon evaluation today patient appears to be doing well with regard to her wound currently. She is showing signs of improvement. Fortunately there is no evidence of active infection at this time. No fevers, chills, nausea, vomiting, or diarrhea. Overall I think that she is making good progress this is just taking this time as far as feeling in the small/deeper area of the wound. 09/18/2020 upon evaluation today patient appears to be doing well with regard to her wound. She has been tolerating the dressing changes without complication. Fortunately there is no signs of active infection at this time. No fevers, chills, nausea, vomiting, or diarrhea. Electronic Signature(s) Signed: 09/18/2020 2:27:30 PM By: [name] Entered By: Worthy Keeler on 09/18/2020 14:27:30 Hillhouse, Vee [name] Kitchen  ([id number]) -------------------------------------------------------------------------------- Physical Exam Details Patient Name: Kempfer, [name] Date of Service: 09/18/2020 1:30 PM Medical Record Number: [id number] Patient Account Number: [id number] Date of Birth/Sex: 01/04/1946 (75 y.o. F) Treating RN: Carlene Coria Primary Care Provider: [name] Other Clinician: Referring Provider: [name] Treating Provider/Extender: [name] in Treatment: 92 Constitutional Well-nourished and well-hydrated in no acute distress. Respiratory normal breathing without difficulty. Psychiatric this patient is able to make decisions and demonstrates good insight into disease process. Alert and Oriented x 3. pleasant and cooperative. Notes Upon inspection patient's wound bed showed signs of good granulation epithelization. There does not appear to be any evidence of infection and overall I am extremely pleased with where things stand today. No fevers, chills, nausea, vomiting, or diarrhea. Electronic Signature(s) Signed: 09/18/2020 2:29:22 PM By: [name] Entered By: Worthy Keeler on 09/18/2020 14:29:21 [name], [name] ([id number]) -------------------------------------------------------------------------------- Physician Orders Details Patient Name: Cindy Ohm C. Date of Service: 09/18/2020 1:30 PM Medical Record Number: [id number] Patient Account Number: [id number] Date of Birth/Sex: Jan 22, 1946 (75 y.o. F) Treating RN: [name] Primary Care Provider: [name] Other Clinician: Referring Provider: [name] Treating Provider/Extender: [name] in Treatment: 45 Verbal / Phone Orders: No Diagnosis Coding ICD-10 Coding Code Description I87.2 Venous insufficiency (chronic) (peripheral) S81.802A Unspecified open wound, left lower leg, initial encounter L97.822 Non-pressure chronic ulcer of other part of left lower leg with fat layer  exposed I10 Essential (primary) hypertension Follow-up Appointments Wound #1 Left,Anterior Lower Leg o Return Appointment in 2 weeks. Bathing/ Shower/ Hygiene Wound #1 Left,Anterior Lower Leg   o May shower; gently cleanse wound with antibacterial soap, rinse and pat dry prior to dressing wounds Edema Control - Lymphedema / Segmental Compressive Device / Other Bilateral Lower Extremities o Patient to wear own compression stockings. Remove compression stockings every night before going to bed and put on every morning when getting up. o Elevate, Exercise Daily and Avoid Standing for Long Periods of Time. Wound Treatment Wound #1 - Lower Leg Wound Laterality: Left, Anterior Cleanser: Normal Saline 3 x Per Week/30 Days Discharge Instructions: Wash your hands with soap and water. Remove old dressing, discard into plastic bag and place into trash. Cleanse the wound with Normal Saline prior to applying a clean dressing using gauze sponges, not tissues or cotton balls. Do not scrub or use excessive force. Pat dry using gauze sponges, not tissue or cotton balls. Topical: Gentamicin 3 x Per Week/30 Days Discharge Instructions: Apply as directed by provider. Primary Dressing: Silvercel Small 2x2 (in/in) 3 x Per Week/30 Days Discharge Instructions: Apply Silvercel Small 2x2 (in/in) as instructed Secondary Dressing: Non-Adherent Pad 3x8 (in/in) 3 x Per Week/30 Days Secured With: Curad Paper Tape, 1x10 (in/yd) 3 x Per Week/30 Days Electronic Signature(s) Signed: 09/18/2020 4:44:45 PM By: [name] Signed: 09/18/2020 4:57:08 PM By: [name], [name] Entered By: [name], Kenia on 09/18/2020 14:10:48 [name], [name] ([id number]) -------------------------------------------------------------------------------- Problem List Details Patient Name: [name], [name] Date of Service: 09/18/2020 1:30 PM Medical Record Number: [id number] Patient Account Number:  [id number] Date of Birth/Sex: Sep 15, 1945 (75 y.o. F) Treating RN: Carlene Coria Primary Care Provider: [name] Other Clinician: Referring Provider: [name] Treating Provider/Extender: [name] in Treatment: 11 Active Problems ICD-10 Encounter Code Description Active Date MDM Diagnosis I87.2 Venous insufficiency (chronic) (peripheral) 07/03/2020 No Yes S81.802A Unspecified open wound, left lower leg, initial encounter 07/03/2020 No Yes L97.822 Non-pressure chronic ulcer of other part of left lower leg with fat layer 07/03/2020 No Yes exposed [place] (primary) hypertension 07/03/2020 No Yes Inactive Problems Resolved Problems Electronic Signature(s) Signed: 09/18/2020 1:54:09 PM By: [name] Entered By: Worthy Keeler on 09/18/2020 13:54:08 Miron, [name] ([id number]) -------------------------------------------------------------------------------- Progress Note Details Patient Name: Whipkey, [name] Date of Service: 09/18/2020 1:30 PM Medical Record Number: [id number] Patient Account Number: [id number] Date of Birth/Sex: 03-24-1946 (75 y.o. F) Treating RN: Carlene Coria Primary Care Provider: [name] Other Clinician: Referring Provider: [name] Treating Provider/Extender: [name] in Treatment: 11 Subjective Chief Complaint Information obtained from Patient Left LE Ulcer History of Present Illness (HPI) 07/03/2020 upon evaluation today patient appears to be doing unfortunately somewhat poorly in regard to her leg where she struck this causing a wound 05/18/2020. She states that since that time she has had mainly an eschar covering sometimes it will drain sometimes not its been infected a couple times as well and she has been on several antibiotics including 5 days left of doxycycline that she is currently taking. No sharp debridement has been undertaken by anyone at this point. She tells me that she does have a  history of leg swelling she is supposed to wear compression socks but she does not always do this. She also tells me that she does have hypertension but otherwise no major medical problems. She has not really been doing anything compression wise since this was injured in October. 07/10/2020 on evaluation today patient actually appears to be making some progress here. Fortunately there is no signs of active infection systemically locally she still has some  evidence of erythema infection although this is minimal compared to what it was even last week. Overall I am pleased in that regard. There does not appear to be any signs of active infection spreading outside aware of marked that is good news as well. In general the patient seems to be doing well although I think we already continue to probably treat her we likely need to switch her to Bactrim DS as the doxycycline is resistant that is what she was taking as of last week. 07/17/2020 upon evaluation today patient appears to be doing excellent in regard to her leg ulcer. In fact I think we can probably switch to collagen-based dressing at this point as she seems to be doing so well. There is no evidence of infection at this time and I think that she is making good progress. I do want her to continue with the oral antibiotics at this point. 07/31/2020 upon evaluation today patient appears to be doing quite well in regard to her lower extremity ulcer. She has been tolerating the dressing changes without complication. Fortunately there is no evidence of active infection at this time. I been very pleased with where things stand currently. No fevers, chills, nausea, vomiting, or diarrhea. 08/07/2020 on evaluation today patient appears to continue to make signs of good improvement with regard to her wound. I am very pleased with where things stand this is measuring smaller and overall I think she is doing excellent. 08/14/2020 upon evaluation today patient  appears to be doing well in regard to her leg ulcer. She has been tolerating the dressing changes without complication. Fortunately there is no signs of active infection at this time which is also great news. No fevers, chills, nausea, vomiting, or diarrhea. 08/21/2020 on evaluation today patient appears to be doing well with regard to her wound in general. With that being said she does have some issues currently with still a small area that had a little bit more depth roughly at the 11:00 location upon inspection. There is a little bit of undermining/tunneling here. With that being said I think we may want to try a little bit different dressing to see if this can be beneficial for her I am thinking endoform. 08/28/2020 upon evaluation today patient appears to be making a little bit of progress. This is measuring a little bit better I do believe the endoform has been beneficial for her. I did keep the small hole open and I do feel like that is showing some signs of improvement all of which is great news. 09/04/2020 upon evaluation today patient appears to be doing well with regard to her leg ulcer. She has been tolerating the dressing changes without complication. Fortunately there is no evidence of active infection at this time. No fevers, chills, nausea, vomiting, or diarrhea. 09/11/2020 upon evaluation today patient appears to be doing well with regard to her wound currently. She is showing signs of improvement. Fortunately there is no evidence of active infection at this time. No fevers, chills, nausea, vomiting, or diarrhea. Overall I think that she is making good progress this is just taking this time as far as feeling in the small/deeper area of the wound. 09/18/2020 upon evaluation today patient appears to be doing well with regard to her wound. She has been tolerating the dressing changes without complication. Fortunately there is no signs of active infection at this time. No fevers, chills,  nausea, vomiting, or diarrhea. Objective Constitutional Well-nourished and well-hydrated in no acute distress. Uriostegui,  [name] ([id number]) Vitals Time Taken: 1:41 PM, Height: 61 in, Weight: 175 lbs, BMI: 33.1, Temperature: 98.0 F, Pulse: 73 bpm, Respiratory Rate: 20 breaths/min, Blood Pressure: 137/75 mmHg. Respiratory normal breathing without difficulty. Psychiatric this patient is able to make decisions and demonstrates good insight into disease process. Alert and Oriented x 3. pleasant and cooperative. General Notes: Upon inspection patient's wound bed showed signs of good granulation epithelization. There does not appear to be any evidence of infection and overall I am extremely pleased with where things stand today. No fevers, chills, nausea, vomiting, or diarrhea. Integumentary (Hair, Skin) Wound #1 status is Open. Original cause of wound was Puncture. The date acquired was: 05/18/2020. The wound has been in treatment 11 weeks. The wound is located on the Left,Anterior Lower Leg. The wound measures 0.3cm length x 0.2cm width x 0.1cm depth; 0.047cm^2 area and 0.005cm^3 volume. There is Fat Layer (Subcutaneous Tissue) exposed. There is no tunneling or undermining noted. There is a small amount of serous drainage noted. The wound margin is flat and intact. There is large (67-100%) pink, pale granulation within the wound bed. There is a small (1-33%) amount of necrotic tissue within the wound bed including Adherent Slough. Assessment Active Problems ICD-10 Venous insufficiency (chronic) (peripheral) Unspecified open wound, left lower leg, initial encounter Non-pressure chronic ulcer of other part of left lower leg with fat layer exposed Essential (primary) hypertension Plan Follow-up Appointments: Wound #1 Left,Anterior Lower Leg: Return Appointment in 2 weeks. Bathing/ Shower/ Hygiene: Wound #1 Left,Anterior Lower Leg: May shower; gently cleanse wound with antibacterial  soap, rinse and pat dry prior to dressing wounds Edema Control - Lymphedema / Segmental Compressive Device / Other: Patient to wear own compression stockings. Remove compression stockings every night before going to bed and put on every morning when getting up. Elevate, Exercise Daily and Avoid Standing for Long Periods of Time. WOUND #1: - Lower Leg Wound Laterality: Left, Anterior Cleanser: Normal Saline 3 x Per Week/30 Days Discharge Instructions: Wash your hands with soap and water. Remove old dressing, discard into plastic bag and place into trash. Cleanse the wound with Normal Saline prior to applying a clean dressing using gauze sponges, not tissues or cotton balls. Do not scrub or use excessive force. Pat dry using gauze sponges, not tissue or cotton balls. Topical: Gentamicin 3 x Per Week/30 Days Discharge Instructions: Apply as directed by provider. Primary Dressing: Silvercel Small 2x2 (in/in) 3 x Per Week/30 Days Discharge Instructions: Apply Silvercel Small 2x2 (in/in) as instructed Secondary Dressing: Non-Adherent Pad 3x8 (in/in) 3 x Per Week/30 Days Secured With: Curad Paper Tape, 1x10 (in/yd) 3 x Per Week/30 Days 1. Would recommend currently that we going continue with the wound care measures as before and the patient is in agreement the plan that includes the use of the bandage to cover although we will switch the primary dressing and this is going to be switched to a silver alginate dressing as I believe this is almost completely healed. 2. I am also going to recommend that we have the patient continue to monitor for any signs of infection right now she is using the gentamicin and seems to be doing quite well. 3. I am also can recommend she continue with her compression stocking. We will see patient back for reevaluation in 1 week here in the clinic. If anything worsens or changes patient will contact our office for additional recommendations. [name], [name]  ([id number]) Electronic Signature(s) Signed: 09/18/2020 2:30:00 PM By: Melburn Hake,  Earlena Werst PA-C Entered By: Worthy Keeler on 09/18/2020 14:30:00 Brod, [name] ([id number]) -------------------------------------------------------------------------------- SuperBill Details Patient Name: WISNEWSKI, [name] Date of Service: 09/18/2020 Medical Record Number: [id number] Patient Account Number: [id number] Date of Birth/Sex: August 25, 1945 (75 y.o. F) Treating RN: [name] Primary Care Provider: [name] Other Clinician: Referring Provider: [name] Treating Provider/Extender: [name] in Treatment: 11 Diagnosis Coding ICD-10 Codes Code Description I87.2 Venous insufficiency (chronic) (peripheral) S81.802A Unspecified open wound, left lower leg, initial encounter L97.822 Non-pressure chronic ulcer of other part of left lower leg with fat layer exposed [place] (primary) hypertension Facility Procedures CPT4 Code: [id number] Description: 99213 - WOUND CARE VISIT-LEV 3 EST PT Modifier: Quantity: 1 Physician Procedures CPT4 Code: 9753005 Description: 11021 - WC PHYS LEVEL 3 - EST PT Modifier: Quantity: 1 CPT4 Code: Description: ICD-10 Diagnosis Description I87.2 Venous insufficiency (chronic) (peripheral) S81.802A Unspecified open wound, left lower leg, initial encounter L97.822 Non-pressure chronic ulcer of other part of left lower leg with fat lay [place]  (primary) hypertension Modifier: er exposed Quantity: Electronic Signature(s) Signed: 09/18/2020 2:32:30 PM By: [name] Entered By: Worthy Keeler on 09/18/2020 14:32:30

## 2020-09-22 DIAGNOSIS — M797 Fibromyalgia: Secondary | ICD-10-CM | POA: Diagnosis not present

## 2020-09-22 DIAGNOSIS — M19042 Primary osteoarthritis, left hand: Secondary | ICD-10-CM | POA: Diagnosis not present

## 2020-09-22 DIAGNOSIS — J301 Allergic rhinitis due to pollen: Secondary | ICD-10-CM | POA: Diagnosis not present

## 2020-09-22 DIAGNOSIS — L309 Dermatitis, unspecified: Secondary | ICD-10-CM | POA: Diagnosis not present

## 2020-09-22 DIAGNOSIS — J3089 Other allergic rhinitis: Secondary | ICD-10-CM | POA: Diagnosis not present

## 2020-09-22 DIAGNOSIS — M5136 Other intervertebral disc degeneration, lumbar region: Secondary | ICD-10-CM | POA: Diagnosis not present

## 2020-09-22 DIAGNOSIS — M19041 Primary osteoarthritis, right hand: Secondary | ICD-10-CM | POA: Diagnosis not present

## 2020-09-23 NOTE — Progress Notes (Signed)
JOYCLYN, PLAZOLA ([id number]) Visit Report for 09/18/2020 Arrival Information Details Patient Name: [name], [name]. Date of Service: 09/18/2020 1:30 PM Medical Record Number: [id number] Patient Account Number: [id number] Date of Birth/Sex: 1945-12-31 (74 y.o. F) Treating RN: Carlene Coria Primary Care Deneshia Zucker: [name] Other Clinician: Referring Emiliana Blaize: [name] Treating Jazmine Longshore/Extender: [name] in Treatment: 11 Visit Information History Since Last Visit Added or deleted any medications: No Patient Arrived: Ambulatory Had a fall or experienced change in No Arrival Time: 13:37 activities of daily living that may affect Accompanied By: self risk of falls: Transfer Assistance: None Hospitalized since last visit: No Patient Identification Verified: Yes Pain Present Now: No Secondary Verification Process Completed: Yes Electronic Signature(s) Signed: 09/18/2020 4:23:58 PM By: Jeanine Luz Entered By: Jeanine Luz on 09/18/2020 13:39:47 Reigle, Laisa CMarland Kitchen ([id number]) -------------------------------------------------------------------------------- Clinic Level of Care Assessment Details Patient Name: Spillman, [name] Date of Service: 09/18/2020 1:30 PM Medical Record Number: [id number] Patient Account Number: [id number] Date of Birth/Sex: 05/05/1946 (74 y.o. F) Treating RN: [name] Primary Care Caidon Foti: [name] Other Clinician: Referring Ignacia Gentzler: [name] Treating Bianco Cange/Extender: [name] in Treatment: 11 Clinic Level of Care Assessment Items TOOL 4 Quantity Score X - Use when only an EandM is performed on FOLLOW-UP visit 1 0 ASSESSMENTS - Nursing Assessment / Reassessment X - Reassessment of Co-morbidities (includes updates in patient status) 1 10 X- 1 5 Reassessment of Adherence to Treatment Plan ASSESSMENTS - Wound and Skin Assessment / Reassessment X - Simple Wound Assessment / Reassessment - one wound 1  5 []  - 0 Complex Wound Assessment / Reassessment - multiple wounds []  - 0 Dermatologic / Skin Assessment (not related to wound area) ASSESSMENTS - Focused Assessment []  - Circumferential Edema Measurements - multi extremities 0 []  - 0 Nutritional Assessment / Counseling / Intervention []  - 0 Lower Extremity Assessment (monofilament, tuning fork, pulses) []  - 0 Peripheral Arterial Disease Assessment (using hand held doppler) ASSESSMENTS - Ostomy and/or Continence Assessment and Care []  - Incontinence Assessment and Management 0 []  - 0 Ostomy Care Assessment and Management (repouching, etc.) PROCESS - Coordination of Care X - Simple Patient / Family Education for ongoing care 1 15 []  - 0 Complex (extensive) Patient / Family Education for ongoing care []  - 0 Staff obtains Programmer, systems, Records, Test Results / Process Orders []  - 0 Staff telephones HHA, Nursing Homes / Clarify orders / etc []  - 0 Routine Transfer to another Facility (non-emergent condition) []  - 0 Routine Hospital Admission (non-emergent condition) []  - 0 New Admissions / Biomedical engineer / Ordering NPWT, Apligraf, etc. []  - 0 Emergency Hospital Admission (emergent condition) X- 1 10 Simple Discharge Coordination []  - 0 Complex (extensive) Discharge Coordination PROCESS - Special Needs []  - Pediatric / Minor Patient Management 0 []  - 0 Isolation Patient Management []  - 0 Hearing / Language / Visual special needs []  - 0 Assessment of Community assistance (transportation, D/C planning, etc.) []  - 0 Additional assistance / Altered mentation []  - 0 Support Surface(s) Assessment (bed, cushion, seat, etc.) INTERVENTIONS - Wound Cleansing / Measurement Polizzi, Markea C. ([id number]) X- 1 5 Simple Wound Cleansing - one wound []  - 0 Complex Wound Cleansing - multiple wounds X- 1 5 Wound Imaging (photographs - any number of wounds) []  - 0 Wound Tracing (instead of photographs) X- 1 5 Simple Wound  Measurement - one wound []  - 0 Complex Wound Measurement - multiple wounds INTERVENTIONS - Wound Dressings []  - Small Wound Dressing one or multiple  wounds 0 X- 1 15 Medium Wound Dressing one or multiple wounds []  - 0 Large Wound Dressing one or multiple wounds []  - 0 Application of Medications - topical []  - 0 Application of Medications - injection INTERVENTIONS - Miscellaneous []  - External ear exam 0 []  - 0 Specimen Collection (cultures, biopsies, blood, body fluids, etc.) []  - 0 Specimen(s) / Culture(s) sent or taken to Lab for analysis []  - 0 Patient Transfer (multiple staff / Harrel Lemon Lift / Similar devices) []  - 0 Simple Staple / Suture removal (25 or less) []  - 0 Complex Staple / Suture removal (26 or more) []  - 0 Hypo / Hyperglycemic Management (close monitor of Blood Glucose) []  - 0 Ankle / Brachial Index (ABI) - do not check if billed separately X- 1 5 Vital Signs Has the patient been seen at the hospital within the last three years: Yes Total Score: 80 Level Of Care: New/Established - Level 3 Electronic Signature(s) Signed: 09/18/2020 4:57:08 PM By: [name], [name] Entered By: [name], Kenia on 09/18/2020 14:11:17 Kell, Lenny CMarland Kitchen ([id number]) -------------------------------------------------------------------------------- Encounter Discharge Information Details Patient Name: [name] C. Date of Service: 09/18/2020 1:30 PM Medical Record Number: [id number] Patient Account Number: [id number] Date of Birth/Sex: [birth year] (74 y.o. F) Treating RN: [name] Primary Care Anubis Fundora: [name] Other Clinician: Referring Bryer Gottsch: [name] Treating Rohini Jaroszewski/Extender: [name] in Treatment: 11 Encounter Discharge Information Items Discharge Condition: Stable Ambulatory Status: Ambulatory Discharge Destination: Home Transportation: Private Auto Accompanied By: self Schedule Follow-up Appointment: Yes Clinical  Summary of Care: Electronic Signature(s) Signed: 09/18/2020 4:57:08 PM By: [name], [name] Entered By: [name], Minus Breeding on 09/18/2020 14:11:55 Blake, Dwanna CMarland Kitchen ([id number]) -------------------------------------------------------------------------------- Lower Extremity Assessment Details Patient Name: Oser, Yoselyn C. Date of Service: 09/18/2020 1:30 PM Medical Record Number: [id number] Patient Account Number: [id number] Date of Birth/Sex: 30-Jan-1946 (74 y.o. F) Treating RN: Carlene Coria Primary Care Temeka Pore: [name] Other Clinician: Referring Sanjna Haskew: [name] Treating Bruin Bolger/Extender: [name] in Treatment: 11 Edema Assessment Assessed: [Left: No] [Right: No] [Left: Edema] [Right: :] Calf Left: Right: Point of Measurement: 32 cm From Medial Instep 37 cm Ankle Left: Right: Point of Measurement: 10 cm From Medial Instep 22.5 cm Vascular Assessment Pulses: Dorsalis Pedis Palpable: [Left:Yes] Electronic Signature(s) Signed: 09/18/2020 4:23:58 PM By: Jeanine Luz Signed: 09/22/2020 4:52:45 PM By: Carlene Coria RN Entered By: Jeanine Luz on 09/18/2020 13:49:16 Vecchione, Celia C. ([id number]) -------------------------------------------------------------------------------- Multi Wound Chart Details Patient Name: Cindy Loop, Shakti C. Date of Service: 09/18/2020 1:30 PM Medical Record Number: [id number] Patient Account Number: [id number] Date of Birth/Sex: 04-14-1946 (74 y.o. F) Treating RN: [name] Primary Care Katalina Magri: [name] Other Clinician: Referring Brianca Fortenberry: [name] Treating Demri Poulton/Extender: [name] in Treatment: 11 Vital Signs Height(in): 61 Pulse(bpm): 53 Weight(lbs): 175 Blood Pressure(mmHg): 137/75 Body Mass Index(BMI): 33 Temperature(F): 98.0 Respiratory Rate(breaths/min): 20 Photos: [N/A:N/A] Wound Location: Left, Anterior Lower Leg N/A N/A Wounding Event: Puncture N/A  N/A Primary Etiology: Skin Tear N/A N/A Comorbid History: Cataracts, Asthma, Hypertension, N/A N/A Osteoarthritis, Received Radiation Date Acquired: 05/18/2020 N/A N/A Weeks of Treatment: 11 N/A N/A Wound Status: Open N/A N/A Measurements L x W x D (cm) 0.3x0.2x0.1 N/A N/A Area (cm) : 0.047 N/A N/A Volume (cm) : 0.005 N/A N/A % Reduction in Area: 96.90% N/A N/A % Reduction in Volume: 96.70% N/A N/A Classification: Full Thickness Without Exposed N/A N/A Support Structures Exudate Amount: Small N/A N/A Exudate Type: Serous N/A N/A Exudate Color: amber N/A N/A Wound Margin:  Flat and Intact N/A N/A Granulation Amount: Large (67-100%) N/A N/A Granulation Quality: Pink, Pale N/A N/A Necrotic Amount: Small (1-33%) N/A N/A Exposed Structures: Fat Layer (Subcutaneous Tissue): N/A N/A Yes Fascia: No Tendon: No Muscle: No Joint: No Bone: No Epithelialization: None N/A N/A Treatment Notes Electronic Signature(s) Signed: 09/18/2020 4:57:08 PM By: [name], [name] Entered By: [name], Minus Breeding on 09/18/2020 13:59:09 Feehan, [name] ([id number]) -------------------------------------------------------------------------------- Multi-Disciplinary Care Plan Details Patient Name: [name] Date of Service: 09/18/2020 1:30 PM Medical Record Number: [id number] Patient Account Number: [id number] Date of Birth/Sex: 1946/05/25 (74 y.o. F) Treating RN: [name] Primary Care Shayanne Gomm: [name] Other Clinician: Referring Suzannah Bettes: [name] Treating Saori Umholtz/Extender: [name] in Treatment: 11 Active Inactive Wound/Skin Impairment Nursing Diagnoses: Impaired tissue integrity Goals: Ulcer/skin breakdown will have a volume reduction of 30% by week 4 Date Initiated: 07/03/2020 Date Inactivated: 08/28/2020 Target Resolution Date: 07/31/2020 Goal Status: Unmet Unmet Reason: comorbities Ulcer/skin breakdown will have a volume reduction of 50% by  week 8 Date Initiated: 08/28/2020 Target Resolution Date: 09/25/2020 Goal Status: Active Interventions: Assess ulceration(s) every visit Notes: Electronic Signature(s) Signed: 09/18/2020 4:57:08 PM By: [name], [name] Entered By: [name], Minus Breeding on 09/18/2020 13:59:02 Parham, Jeydi C. ([id number]) -------------------------------------------------------------------------------- Pain Assessment Details Patient Name: Cindy Loop, Pamella C. Date of Service: 09/18/2020 1:30 PM Medical Record Number: [id number] Patient Account Number: [id number] Date of Birth/Sex: 1945-08-01 (74 y.o. F) Treating RN: Carlene Coria Primary Care Teletha Petrea: [name] Other Clinician: Referring Tymier Lindholm: [name] Treating Ceanna Wareing/Extender: [name] in Treatment: 11 Active Problems Location of Pain Severity and Description of Pain Patient Has Paino Yes Site Locations Rate the pain. Current Pain Level: 1 Pain Management and Medication Current Pain Management: Electronic Signature(s) Signed: 09/18/2020 4:23:58 PM By: Jeanine Luz Signed: 09/22/2020 4:52:45 PM By: Carlene Coria RN Entered By: Jeanine Luz on 09/18/2020 13:42:08 Mauney, Vianny CMarland Kitchen ([id number]) -------------------------------------------------------------------------------- Patient/Caregiver Education Details Patient Name: Cindy Loop, Zamoria C. Date of Service: 09/18/2020 1:30 PM Medical Record Number: [id number] Patient Account Number: [id number] Date of Birth/Gender: July 13, 1946 (74 y.o. F) Treating RN: [name] Primary Care Physician: [name] Other Clinician: Referring Physician: [name] Treating Physician/Extender: [name] in Treatment: 11 Education Assessment Education Provided To: Patient Education Topics Provided Wound/Skin Impairment: Methods: Explain/Verbal Responses: State content correctly Electronic Signature(s) Signed: 09/18/2020 4:57:08 PM By: [name],  [name] Entered By: [name], Minus Breeding on 09/18/2020 14:11:32 Mizzell, Jazzmyne C. ([id number]) -------------------------------------------------------------------------------- Wound Assessment Details Patient Name: Cindy Loop, Pariss C. Date of Service: 09/18/2020 1:30 PM Medical Record Number: [id number] Patient Account Number: [id number] Date of Birth/Sex: [birth year] (74 y.o. F) Treating RN: Carlene Coria Primary Care Cassidi Modesitt: [name] Other Clinician: Referring Kelaiah Escalona: [name] Treating Makinzie Considine/Extender: [name] in Treatment: 11 Wound Status Wound Number: 1 Primary Skin Tear Etiology: Wound Location: Left, Anterior Lower Leg Wound Status: Open Wounding Event: Puncture Comorbid Cataracts, Asthma, Hypertension, Osteoarthritis, Date Acquired: 05/18/2020 History: Received Radiation Weeks Of Treatment: 11 Clustered Wound: No Photos Wound Measurements Length: (cm) 0.3 Width: (cm) 0.2 Depth: (cm) 0.1 Area: (cm) 0.047 Volume: (cm) 0.005 % Reduction in Area: 96.9% % Reduction in Volume: 96.7% Epithelialization: None Tunneling: No Undermining: No Wound Description Classification: Full Thickness Without Exposed Support Structures Wound Margin: Flat and Intact Exudate Amount: Small Exudate Type: Serous Exudate Color: amber Foul Odor After Cleansing: No Slough/Fibrino Yes Wound Bed Granulation Amount: Large (67-100%) Exposed Structure Granulation Quality: Pink, Pale Fascia Exposed: No Necrotic Amount: Small (1-33%) Fat Layer (Subcutaneous Tissue) Exposed:  Yes Necrotic Quality: Adherent Slough Tendon Exposed: No Muscle Exposed: No Joint Exposed: No Bone Exposed: No Treatment Notes Wound #1 (Lower Leg) Wound Laterality: Left, Anterior Cleanser Normal Saline Discharge Instruction: Wash your hands with soap and water. Remove old dressing, discard into plastic bag and place into trash. Cleanse the wound with Normal Saline prior to applying a  clean dressing using gauze sponges, not tissues or cotton balls. Do not scrub or use excessive force. Pat dry using gauze sponges, not tissue or cotton balls. Bump, Terrye C. ([id number]) Peri-Wound Care Topical Gentamicin Discharge Instruction: Apply as directed by Jethro Radke. Primary Dressing Silvercel Small 2x2 (in/in) Discharge Instruction: Apply Silvercel Small 2x2 (in/in) as instructed Secondary Dressing Non-Adherent Pad 3x8 (in/in) Secured With Curad Paper Tape, 1x10 (in/yd) Compression Wrap Compression Stockings Add-Ons Electronic Signature(s) Signed: 09/18/2020 4:23:58 PM By: Jeanine Luz Signed: 09/22/2020 4:52:45 PM By: Carlene Coria RN Entered By: Jeanine Luz on 09/18/2020 13:47:40 Schwarz, Kelcie C. ([id number]) -------------------------------------------------------------------------------- Vitals Details Patient Name: Cindy Loop, Maryagnes C. Date of Service: 09/18/2020 1:30 PM Medical Record Number: [id number] Patient Account Number: [id number] Date of Birth/Sex: 06/09/1946 (74 y.o. F) Treating RN: Carlene Coria Primary Care Jamia Hoban: [name] Other Clinician: Referring Jalal Rauch: [name] Treating Albino Bufford/Extender: [name] in Treatment: 11 Vital Signs Time Taken: 13:41 Temperature (F): 98.0 Height (in): 61 Pulse (bpm): 73 Weight (lbs): 175 Respiratory Rate (breaths/min): 20 Body Mass Index (BMI): 33.1 Blood Pressure (mmHg): 137/75 Reference Range: 80 - 120 mg / dl Electronic Signature(s) Signed: 09/18/2020 4:23:58 PM By: Jeanine Luz Entered By: Jeanine Luz on 09/18/2020 13:41:44

## 2020-09-24 ENCOUNTER — Ambulatory Visit (INDEPENDENT_AMBULATORY_CARE_PROVIDER_SITE_OTHER): Payer: Medicare Other | Admitting: Obstetrics and Gynecology

## 2020-09-24 ENCOUNTER — Other Ambulatory Visit: Payer: Self-pay

## 2020-09-24 ENCOUNTER — Encounter: Payer: Self-pay | Admitting: Obstetrics and Gynecology

## 2020-09-24 VITALS — BP 126/73 | HR 83 | Ht 61.0 in | Wt 172.7 lb

## 2020-09-24 DIAGNOSIS — D0500 Lobular carcinoma in situ of unspecified breast: Secondary | ICD-10-CM

## 2020-09-24 DIAGNOSIS — Z9289 Personal history of other medical treatment: Secondary | ICD-10-CM

## 2020-09-24 DIAGNOSIS — Z01419 Encounter for gynecological examination (general) (routine) without abnormal findings: Secondary | ICD-10-CM | POA: Diagnosis not present

## 2020-09-24 NOTE — Progress Notes (Signed)
HPI:      [name] is a 75 y.o. [phone] who LMP was No LMP recorded. Patient is postmenopausal.  Subjective:   She presents today for her annual examination.  She says she is having a good day today.  She has no specific complaints.  See medical history below. Patient no longer needs Pap smears. History of breast cancer -annual mammography follow-up Patient taking calcium and vitamin D daily. Scheduled for colonoscopy in 2 weeks.    Hx: The following portions of the patient's history were reviewed and updated as appropriate:             She  has a past medical history of Asthma, Bulging lumbar disc, Cancer ([place]) (2800), Complication of anesthesia, DDD (degenerative disc disease), lumbar, DDD (degenerative disc disease), thoracolumbar, Diverticulosis, Family history of adverse reaction to anesthesia, GERD (gastroesophageal reflux disease), Heart murmur, Hepatitis, Hypertension, Hypothyroidism, Interstitial cystitis, Kidney disease, PONV (postoperative nausea and vomiting), and Thyroid disease. She does not have any pertinent problems on file. She  has a past surgical history that includes Tonsillectomy (1952); Excisional hemorrhoidectomy (1975); Thyroidectomy (1999); Bladder surgery (1984); Colonoscopy (2005, 2015); Hemorrhoid surgery (1975); Hernia repair (2015); Cataract extraction (2011); Dilation and curettage of uterus (1975); Breast surgery (3491,7915); Breast surgery (April 2017); Eye surgery (Left, 2011); and Cholecystectomy (N/A, 11/30/2015). Her family history includes Asthma in her paternal aunt; Bladder Cancer in her maternal aunt; Cancer in her father; Diabetes in her maternal aunt; Heart disease in her brother and mother. She  reports that she quit smoking about 34 years ago. She has a 15.00 pack-year smoking history. She has never used smokeless tobacco. She reports that she does not drink alcohol and does not use drugs. She has a current medication list which includes the  following prescription(s): albuterol, albuterol, aspirin, calcium, cannabidiol, cetirizine, vitamin d-3, epinephrine, fluoxetine, fluticasone, fluticasone-salmeterol, furosemide, gentamicin cream, guaifenesin, hydrocodone-acetaminophen, hydroxyzine, levothyroxine, olmesartan, fish oil, pantoprazole, polyethylene glycol-electrolytes, potassium chloride sa, prednisone, pregabalin, PRESCRIPTION MEDICATION, tizanidine, vitamin c, and zolpidem. She is allergic to pollen extract, erythromycin, ether, gramineae pollens, montelukast sodium, penicillins, tamsulosin, and band-aid plus antibiotic [bacitracin-polymyxin b].       Review of Systems:  Review of Systems  Constitutional: Denied constitutional symptoms, night sweats, recent illness, fatigue, fever, insomnia and weight loss.  Eyes: Denied eye symptoms, eye pain, photophobia, vision change and visual disturbance.  Ears/Nose/Throat/Neck: Denied ear, nose, throat or neck symptoms, hearing loss, nasal discharge, sinus congestion and sore throat.  Cardiovascular: Denied cardiovascular symptoms, arrhythmia, chest pain/pressure, edema, exercise intolerance, orthopnea and palpitations.  Respiratory: Denied pulmonary symptoms, asthma, pleuritic pain, productive sputum, cough, dyspnea and wheezing.  Gastrointestinal: Denied, gastro-esophageal reflux, melena, nausea and vomiting.  Genitourinary: Denied genitourinary symptoms including symptomatic vaginal discharge, pelvic relaxation issues, and urinary complaints.  Musculoskeletal: Denied musculoskeletal symptoms, stiffness, swelling, muscle weakness and myalgia.  Dermatologic: Denied dermatology symptoms, rash and scar.  Neurologic: Denied neurology symptoms, dizziness, headache, neck pain and syncope.  Psychiatric: Denied psychiatric symptoms, anxiety and depression.  Endocrine: Denied endocrine symptoms including hot flashes and night sweats.   Meds:   Current Outpatient Medications on File Prior to Visit   Medication Sig Dispense Refill  . albuterol (PROVENTIL) (2.5 MG/3ML) 0.083% nebulizer solution Inhale into the lungs.    . ALBUTEROL IN Inhale into the lungs as needed.    Marland Kitchen aspirin 81 MG tablet Take 81 mg by mouth daily.    . Calcium 500 MG CHEW Chew by mouth.    . cannabidiol (  EPIDIOLEX) 100 MG/ML solution Take by mouth.    . cetirizine (ZYRTEC) 10 MG tablet Take 10 mg by mouth daily.    . Cholecalciferol (VITAMIN D-3) 1000 units CAPS Take by mouth daily.    Marland Kitchen EPINEPHrine 0.3 mg/0.3 mL IJ SOAJ injection See admin instructions.    Marland Kitchen FLUoxetine (PROZAC) 40 MG capsule Take 1 capsule (40 mg total) by mouth every morning. 90 capsule 0  . fluticasone (FLONASE) 50 MCG/ACT nasal spray Place 1 spray into both nostrils daily.    . Fluticasone-Salmeterol (ADVAIR) 250-50 MCG/DOSE AEPB Inhale 1 puff into the lungs 3 (three) times daily as needed.     . furosemide (LASIX) 20 MG tablet Take 1 tablet (20 mg total) by mouth daily. 90 tablet 3  . gentamicin cream (GARAMYCIN) 0.1 % SMARTSIG:Sparingly Topical 3 Times a Week    . guaiFENesin (MUCINEX) 600 MG 12 hr tablet Take by mouth 2 (two) times daily.    Marland Kitchen HYDROcodone-acetaminophen (NORCO) 7.5-325 MG tablet Take by mouth.    . hydrOXYzine (ATARAX/VISTARIL) 25 MG tablet 1 tablet    . levothyroxine (SYNTHROID) 150 MCG tablet TAKE 1 TABLET BY MOUTH DAILY 90 tablet 3  . olmesartan (BENICAR) 20 MG tablet Take 20 mg by mouth as needed (IF BP IS ELEVATED).    . Omega-3 Fatty Acids (FISH OIL) 1000 MG CAPS Take by mouth.    . pantoprazole (PROTONIX) 40 MG tablet Take 1 tablet by mouth every morning.     . polyethylene glycol-electrolytes (NULYTELY) 420 g solution Drink one 8 oz glass every 20 mins until entire container is finished starting at 5:00pm on 10/05/20 4000 mL 0  . potassium chloride SA (KLOR-CON) 20 MEQ tablet TAKE ONE TABLET BY MOUTH EVERY DAY 90 tablet 1  . predniSONE (DELTASONE) 2.5 MG tablet TAKE ONE TABLET EVERY DAY 30 tablet 6  . pregabalin  (LYRICA) 50 MG capsule Take by mouth.    Marland Kitchen PRESCRIPTION MEDICATION Allergy injections once weekly    . tiZANidine (ZANAFLEX) 4 MG capsule Take 4 mg by mouth as needed.     . vitamin C (ASCORBIC ACID) 500 MG tablet Take by mouth.    . zolpidem (AMBIEN) 10 MG tablet Take 1 tablet (10 mg total) by mouth at bedtime. 90 tablet 1   No current facility-administered medications on file prior to visit.          Objective:     Vitals:   09/24/20 1329  BP: 126/73  Pulse: 83    Filed Weights   09/24/20 1329  Weight: 172 lb 11.2 oz (78.3 kg)              Physical examination General NAD, Conversant  HEENT Atraumatic; Op clear with mmm.  Normo-cephalic. Pupils reactive. Anicteric sclerae  Thyroid/Neck Smooth without nodularity or enlargement. Normal ROM.  Neck Supple.  Skin No rashes, lesions or ulceration. Normal palpated skin turgor. No nodularity.  Breasts: No masses or discharge.  Symmetric.  No axillary adenopathy.  Lungs: Clear to auscultation.No rales or wheezes. Normal Respiratory effort, no retractions.  Heart: NSR.  No murmurs or rubs appreciated. No periferal edema  Abdomen: Soft.  Non-tender.  No masses.  No HSM. No hernia  Extremities: Moves all appropriately.  Normal ROM for age. No lymphadenopathy.  Neuro: Oriented to PPT.  Normal mood. Normal affect.     Pelvic:   Vulva: Normal appearance.  No lesions.  Vagina: No lesions or abnormalities noted.  Moderate vaginal atrophy  Support: Normal pelvic  support.  Urethra No masses tenderness or scarring.  Meatus Normal size without lesions or prolapse.  Cervix: Normal appearance.  No lesions.  Anus: Normal exam.  No lesions.  Perineum: Normal exam.  No lesions.        Bimanual   Uterus: Normal size.  Non-tender.  Mobile.  AV.  Adnexae: No masses.  Non-tender to palpation.  Cul-de-sac: Negative for abnormality.      Assessment:    [id number] Patient Active Problem List   Diagnosis Date Noted  . Allergic rhinitis  08/27/2020  . Allergic rhinitis due to animal (cat) (dog) hair and dander 08/27/2020  . Moderate persistent asthma, uncomplicated 32/54/9826  . Pruritus 08/27/2020  . Lower limb ulcer, calf, left, limited to breakdown of skin ([place]) 08/14/2020  . Stage 3 chronic kidney disease ([place]) 08/14/2020  . Swelling of limb 08/14/2020  . Varicose veins of lower extremities with ulcer ([place]) 08/14/2020  . Dysphagia 07/03/2020  . Hyperlipidemia 06/16/2020  . Primary insomnia 06/16/2020  . Seasonal allergic rhinitis due to pollen 05/26/2020  . Cellulitis and abscess of left leg 05/26/2020  . Pyuria 03/11/2020  . Renal stone 09/11/2019  . Acquired hammer toe 08/08/2019  . Myalgia 05/09/2019  . Right anterior knee pain 02/06/2019  . Multiple joint pain 01/23/2019  . Screening for osteoporosis 01/23/2019  . Chronic venous insufficiency 07/28/2017  . Essential hypertension 07/27/2017  . Stage 4 chronic kidney disease ([place]) 05/26/2017  . Visual disturbance 05/26/2017  . Lower extremity pain, bilateral 05/26/2017  . Bilateral lower extremity edema 05/26/2017  . PMB (postmenopausal bleeding) 12/24/2015  . Hemorrhoid 12/24/2015  . Neoplasm of breast, primary tumor staging category Tis: lobular carcinoma in situ (LCIS) 12/24/2015  . Lobular carcinoma in situ of unspecified breast 12/24/2015  . Neuritis or radiculitis due to rupture of lumbar intervertebral disc 05/23/2014  . Degeneration of intervertebral disc of lumbar region 05/23/2014     1. Well woman exam with routine gynecological exam   2. Lobular carcinoma in situ (LCIS) of breast, unspecified laterality     Patient doing well.   Plan:            1.  Basic Screening Recommendations The basic screening recommendations for asymptomatic women were discussed with the patient during her visit.  The age-appropriate recommendations were discussed with her and the rational for the tests reviewed.  When I am informed by the patient that another  primary care physician has previously obtained the age-appropriate tests and they are up-to-date, only outstanding tests are ordered and referrals given as necessary.  Abnormal results of tests will be discussed with her when all of her results are completed.  Routine preventative health maintenance measures emphasized: Exercise/Diet/Weight control, Tobacco Warnings, Alcohol/Substance use risks and Stress Management Mammogram ordered through primary care doctor.  Patient to consider DEXA scan with next mammography. 2.  Continue calcium and vitamin D. 3.  Colonoscopy is scheduled Orders No orders of the defined types were placed in this encounter.   No orders of the defined types were placed in this encounter.           F/U  Return in about 1 year (around 09/24/2021) for Annual Physical.  Finis Bud, M.D. 09/24/2020 1:56 PM

## 2020-09-25 ENCOUNTER — Ambulatory Visit: Payer: Medicare Other | Admitting: Physician Assistant

## 2020-09-28 ENCOUNTER — Other Ambulatory Visit: Payer: Self-pay | Admitting: Internal Medicine

## 2020-09-29 DIAGNOSIS — M2550 Pain in unspecified joint: Secondary | ICD-10-CM | POA: Diagnosis not present

## 2020-09-29 DIAGNOSIS — M5416 Radiculopathy, lumbar region: Secondary | ICD-10-CM | POA: Diagnosis not present

## 2020-09-29 DIAGNOSIS — M5414 Radiculopathy, thoracic region: Secondary | ICD-10-CM | POA: Diagnosis not present

## 2020-09-29 DIAGNOSIS — M5136 Other intervertebral disc degeneration, lumbar region: Secondary | ICD-10-CM | POA: Diagnosis not present

## 2020-10-02 ENCOUNTER — Other Ambulatory Visit
Admission: RE | Admit: 2020-10-02 | Discharge: 2020-10-02 | Disposition: A | Discharge status: Home / Self Care | Payer: Medicare Other | Source: Ambulatory Visit | Attending: Gastroenterology | Admitting: Gastroenterology

## 2020-10-02 ENCOUNTER — Encounter: Payer: Medicare Other | Attending: Physician Assistant | Admitting: Physician Assistant

## 2020-10-02 ENCOUNTER — Other Ambulatory Visit: Payer: Self-pay

## 2020-10-02 DIAGNOSIS — W228XXD Striking against or struck by other objects, subsequent encounter: Secondary | ICD-10-CM | POA: Diagnosis not present

## 2020-10-02 DIAGNOSIS — Z20822 Contact with and (suspected) exposure to covid-19: Secondary | ICD-10-CM | POA: Insufficient documentation

## 2020-10-02 DIAGNOSIS — I872 Venous insufficiency (chronic) (peripheral): Secondary | ICD-10-CM | POA: Insufficient documentation

## 2020-10-02 DIAGNOSIS — S81802D Unspecified open wound, left lower leg, subsequent encounter: Secondary | ICD-10-CM | POA: Diagnosis not present

## 2020-10-02 DIAGNOSIS — J301 Allergic rhinitis due to pollen: Secondary | ICD-10-CM | POA: Diagnosis not present

## 2020-10-02 DIAGNOSIS — I10 Essential (primary) hypertension: Secondary | ICD-10-CM | POA: Diagnosis not present

## 2020-10-02 DIAGNOSIS — J3089 Other allergic rhinitis: Secondary | ICD-10-CM | POA: Diagnosis not present

## 2020-10-02 DIAGNOSIS — L97822 Non-pressure chronic ulcer of other part of left lower leg with fat layer exposed: Secondary | ICD-10-CM | POA: Diagnosis not present

## 2020-10-02 DIAGNOSIS — L97829 Non-pressure chronic ulcer of other part of left lower leg with unspecified severity: Secondary | ICD-10-CM | POA: Diagnosis present

## 2020-10-02 DIAGNOSIS — Z01812 Encounter for preprocedural laboratory examination: Secondary | ICD-10-CM | POA: Insufficient documentation

## 2020-10-02 NOTE — Progress Notes (Addendum)
JOCHEBED, BILLS ([id number]) Visit Report for 10/02/2020 Chief Complaint Document Details Patient Name: [name], [name]. Date of Service: 10/02/2020 11:00 AM Medical Record Number: [id number] Patient Account Number: [id number] Date of Birth/Sex: Oct 06, 1945 (74 y.o. F) Treating RN: Carlene Coria Primary Care Provider: [name] Other Clinician: Referring Provider: [name] Treating Provider/Extender: [name] in Treatment: 13 Information Obtained from: Patient Chief Complaint Left LE Ulcer Electronic Signature(s) Signed: 10/02/2020 11:10:36 AM By: Worthy Keeler PA-[name] Entered By: Worthy Keeler on 10/02/2020 11:10:36 Burleigh, FrohnaMarland Kitchen ([id number]) -------------------------------------------------------------------------------- HPI Details Patient Name: [name]. Date of Service: 10/02/2020 11:00 AM Medical Record Number: [id number] Patient Account Number: [id number] Date of Birth/Sex: 12/31/1945 (74 y.o. F) Treating RN: Carlene Coria Primary Care Provider: [name] Other Clinician: Referring Provider: [name] Treating Provider/Extender: [name] in Treatment: 13 History of Present Illness HPI Description: 07/03/2020 upon evaluation today patient appears to be doing unfortunately somewhat poorly in regard to her leg where she struck this causing a wound 05/18/2020. She states that since that time she has had mainly an eschar covering sometimes it will drain sometimes not its been infected a couple times as well and she has been on several antibiotics including 5 days left of doxycycline that she is currently taking. No sharp debridement has been undertaken by anyone at this point. She tells me that she does have a history of leg swelling she is supposed to wear compression socks but she does not always do this. She also tells me that she does have hypertension but otherwise no major medical problems. She has not really been doing  anything compression wise since this was injured in October. 07/10/2020 on evaluation today patient actually appears to be making some progress here. Fortunately there is no signs of active infection systemically locally she still has some evidence of erythema infection although this is minimal compared to what it was even last week. Overall I am pleased in that regard. There does not appear to be any signs of active infection spreading outside aware of marked that is good news as well. In general the patient seems to be doing well although I think we already continue to probably treat her we likely need to switch her to Bactrim DS as the doxycycline is resistant that is what she was taking as of last week. 07/17/2020 upon evaluation today patient appears to be doing excellent in regard to her leg ulcer. In fact I think we can probably switch to collagen-based dressing at this point as she seems to be doing so well. There is no evidence of infection at this time and I think that she is making good progress. I do want her to continue with the oral antibiotics at this point. 07/31/2020 upon evaluation today patient appears to be doing quite well in regard to her lower extremity ulcer. She has been tolerating the dressing changes without complication. Fortunately there is no evidence of active infection at this time. I been very pleased with where things stand currently. No fevers, chills, nausea, vomiting, or diarrhea. 08/07/2020 on evaluation today patient appears to continue to make signs of good improvement with regard to her wound. I am very pleased with where things stand this is measuring smaller and overall I think she is doing excellent. 08/14/2020 upon evaluation today patient appears to be doing well in regard to her leg ulcer. She has been tolerating the dressing changes without complication. Fortunately there is no signs of active infection at  this time which is also great news. No fevers,  chills, nausea, vomiting, or diarrhea. 08/21/2020 on evaluation today patient appears to be doing well with regard to her wound in general. With that being said she does have some issues currently with still a small area that had a little bit more depth roughly at the 11:00 location upon inspection. There is a little bit of undermining/tunneling here. With that being said I think we may want to try a little bit different dressing to see if this can be beneficial for her I am thinking endoform. 08/28/2020 upon evaluation today patient appears to be making a little bit of progress. This is measuring a little bit better I do believe the endoform has been beneficial for her. I did keep the small hole open and I do feel like that is showing some signs of improvement all of which is great news. 09/04/2020 upon evaluation today patient appears to be doing well with regard to her leg ulcer. She has been tolerating the dressing changes without complication. Fortunately there is no evidence of active infection at this time. No fevers, chills, nausea, vomiting, or diarrhea. 09/11/2020 upon evaluation today patient appears to be doing well with regard to her wound currently. She is showing signs of improvement. Fortunately there is no evidence of active infection at this time. No fevers, chills, nausea, vomiting, or diarrhea. Overall I think that she is making good progress this is just taking this time as far as feeling in the small/deeper area of the wound. 09/18/2020 upon evaluation today patient appears to be doing well with regard to her wound. She has been tolerating the dressing changes without complication. Fortunately there is no signs of active infection at this time. No fevers, chills, nausea, vomiting, or diarrhea. 10/02/2020 on evaluation today patient appears to be completely healed and is doing excellent. There does not appear to be any signs of active infection which is great news and overall very  pleased in that regard. No fevers, chills, nausea, vomiting, or diarrhea. Electronic Signature(s) Signed: 10/02/2020 11:26:13 AM By: Worthy Keeler PA-[name] Entered By: Worthy Keeler on 10/02/2020 11:26:13 Thackeray, Mekiyah Loletha Grayer ([id number]) -------------------------------------------------------------------------------- Physical Exam Details Patient Name: Towles, Solace [name]. Date of Service: 10/02/2020 11:00 AM Medical Record Number: [id number] Patient Account Number: [id number] Date of Birth/Sex: 04-24-1946 (74 y.o. F) Treating RN: Carlene Coria Primary Care Provider: [name] Other Clinician: Referring Provider: [name] Treating Provider/Extender: [name] in Treatment: 38 Constitutional Well-nourished and well-hydrated in no acute distress. Respiratory normal breathing without difficulty. Psychiatric this patient is able to make decisions and demonstrates good insight into disease process. Alert and Oriented x 3. pleasant and cooperative. Notes Patient's wound bed showed signs again of complete epithelization there is no open wound and overall I am extremely pleased with where things stand today. I do not see any need for debridement or otherwise today. Electronic Signature(s) Signed: 10/02/2020 11:26:29 AM By: Worthy Keeler PA-[name] Entered By: Worthy Keeler on 10/02/2020 11:26:29 [name] ([id number]) -------------------------------------------------------------------------------- Physician Orders Details Patient Name: [name]. Date of Service: 10/02/2020 11:00 AM Medical Record Number: [id number] Patient Account Number: [id number] Date of Birth/Sex: August 25, 1945 (74 y.o. F) Treating RN: Carlene Coria Primary Care Provider: [name] Other Clinician: Referring Provider: [name] Treating Provider/Extender: [name] in Treatment: 44 Verbal / Phone Orders: No Diagnosis Coding ICD-10 Coding Code Description I87.2 Venous  insufficiency (chronic) (peripheral) S81.802A Unspecified open wound, left lower leg, initial encounter  D78.242 Non-pressure chronic ulcer of other part of left lower leg with fat layer exposed I10 Essential (primary) hypertension Discharge From [facility] o Discharge from [facility] o Wear compression garments daily. Put garments on first thing when you wake up and remove them before bed. o Moisturize legs daily after removing compression garments. Wound Treatment Electronic Signature(s) Signed: 10/02/2020 11:27:27 AM By: Worthy Keeler PA-[name] Signed: 10/06/2020 4:27:39 PM By: Carlene Coria RN Entered By: Carlene Coria on 10/02/2020 11:22:57 Mcelveen, Nuha CMarland Kitchen ([id number]) -------------------------------------------------------------------------------- Problem List Details Patient Name: [name], Azarya [name]. Date of Service: 10/02/2020 11:00 AM Medical Record Number: [id number] Patient Account Number: [id number] Date of Birth/Sex: 03-Aug-1945 (74 y.o. F) Treating RN: Carlene Coria Primary Care Provider: [name] Other Clinician: Referring Provider: [name] Treating Provider/Extender: [name] in Treatment: 13 Active Problems ICD-10 Encounter Code Description Active Date MDM Diagnosis I87.2 Venous insufficiency (chronic) (peripheral) 07/03/2020 No Yes S81.802A Unspecified open wound, left lower leg, initial encounter 07/03/2020 No Yes L97.822 Non-pressure chronic ulcer of other part of left lower leg with fat layer 07/03/2020 No Yes exposed [place] (primary) hypertension 07/03/2020 No Yes Inactive Problems Resolved Problems Electronic Signature(s) Signed: 10/02/2020 11:10:31 AM By: Worthy Keeler PA-[name] Entered By: Worthy Keeler on 10/02/2020 11:10:31 Troeger, Abbagale [name]. ([id number]) -------------------------------------------------------------------------------- Progress Note Details Patient Name: [name], Mattalyn [name]. Date  of Service: 10/02/2020 11:00 AM Medical Record Number: [id number] Patient Account Number: [id number] Date of Birth/Sex: Dec 12, 1945 (74 y.o. F) Treating RN: Carlene Coria Primary Care Provider: [name] Other Clinician: Referring Provider: [name] Treating Provider/Extender: [name] in Treatment: 13 Subjective Chief Complaint Information obtained from Patient Left LE Ulcer History of Present Illness (HPI) 07/03/2020 upon evaluation today patient appears to be doing unfortunately somewhat poorly in regard to her leg where she struck this causing a wound 05/18/2020. She states that since that time she has had mainly an eschar covering sometimes it will drain sometimes not its been infected a couple times as well and she has been on several antibiotics including 5 days left of doxycycline that she is currently taking. No sharp debridement has been undertaken by anyone at this point. She tells me that she does have a history of leg swelling she is supposed to wear compression socks but she does not always do this. She also tells me that she does have hypertension but otherwise no major medical problems. She has not really been doing anything compression wise since this was injured in October. 07/10/2020 on evaluation today patient actually appears to be making some progress here. Fortunately there is no signs of active infection systemically locally she still has some evidence of erythema infection although this is minimal compared to what it was even last week. Overall I am pleased in that regard. There does not appear to be any signs of active infection spreading outside aware of marked that is good news as well. In general the patient seems to be doing well although I think we already continue to probably treat her we likely need to switch her to Bactrim DS as the doxycycline is resistant that is what she was taking as of last week. 07/17/2020 upon evaluation today patient  appears to be doing excellent in regard to her leg ulcer. In fact I think we can probably switch to collagen-based dressing at this point as she seems to be doing so well. There is no evidence of infection at this time and I think that she is  making good progress. I do want her to continue with the oral antibiotics at this point. 07/31/2020 upon evaluation today patient appears to be doing quite well in regard to her lower extremity ulcer. She has been tolerating the dressing changes without complication. Fortunately there is no evidence of active infection at this time. I been very pleased with where things stand currently. No fevers, chills, nausea, vomiting, or diarrhea. 08/07/2020 on evaluation today patient appears to continue to make signs of good improvement with regard to her wound. I am very pleased with where things stand this is measuring smaller and overall I think she is doing excellent. 08/14/2020 upon evaluation today patient appears to be doing well in regard to her leg ulcer. She has been tolerating the dressing changes without complication. Fortunately there is no signs of active infection at this time which is also great news. No fevers, chills, nausea, vomiting, or diarrhea. 08/21/2020 on evaluation today patient appears to be doing well with regard to her wound in general. With that being said she does have some issues currently with still a small area that had a little bit more depth roughly at the 11:00 location upon inspection. There is a little bit of undermining/tunneling here. With that being said I think we may want to try a little bit different dressing to see if this can be beneficial for her I am thinking endoform. 08/28/2020 upon evaluation today patient appears to be making a little bit of progress. This is measuring a little bit better I do believe the endoform has been beneficial for her. I did keep the small hole open and I do feel like that is showing some signs of  improvement all of which is great news. 09/04/2020 upon evaluation today patient appears to be doing well with regard to her leg ulcer. She has been tolerating the dressing changes without complication. Fortunately there is no evidence of active infection at this time. No fevers, chills, nausea, vomiting, or diarrhea. 09/11/2020 upon evaluation today patient appears to be doing well with regard to her wound currently. She is showing signs of improvement. Fortunately there is no evidence of active infection at this time. No fevers, chills, nausea, vomiting, or diarrhea. Overall I think that she is making good progress this is just taking this time as far as feeling in the small/deeper area of the wound. 09/18/2020 upon evaluation today patient appears to be doing well with regard to her wound. She has been tolerating the dressing changes without complication. Fortunately there is no signs of active infection at this time. No fevers, chills, nausea, vomiting, or diarrhea. 10/02/2020 on evaluation today patient appears to be completely healed and is doing excellent. There does not appear to be any signs of active infection which is great news and overall very pleased in that regard. No fevers, chills, nausea, vomiting, or diarrhea. Objective Radoncic, Jasalyn [name]. ([id number]) Constitutional Well-nourished and well-hydrated in no acute distress. Vitals Time Taken: 11:18 AM, Height: 61 in, Weight: 175 lbs, BMI: 33.1, Temperature: 98.5 F, Pulse: 79 bpm, Respiratory Rate: 18 breaths/min, Blood Pressure: 113/69 mmHg. Respiratory normal breathing without difficulty. Psychiatric this patient is able to make decisions and demonstrates good insight into disease process. Alert and Oriented x 3. pleasant and cooperative. General Notes: Patient's wound bed showed signs again of complete epithelization there is no open wound and overall I am extremely pleased with where things stand today. I do not see any need for  debridement or otherwise today. Integumentary (Hair,  Skin) Wound #1 status is Open. Original cause of wound was Puncture. The date acquired was: 05/18/2020. The wound has been in treatment 13 weeks. The wound is located on the Left,Anterior Lower Leg. The wound measures 0cm length x 0cm width x 0cm depth; 0cm^2 area and 0cm^3 volume. There is no tunneling or undermining noted. There is a none present amount of drainage noted. The wound margin is flat and intact. There is no granulation within the wound bed. There is no necrotic tissue within the wound bed. Assessment Active Problems ICD-10 Venous insufficiency (chronic) (peripheral) Unspecified open wound, left lower leg, initial encounter Non-pressure chronic ulcer of other part of left lower leg with fat layer exposed Essential (primary) hypertension Plan Discharge From [facility] Services: Discharge from [facility] Wear compression garments daily. Put garments on first thing when you wake up and remove them before bed. Moisturize legs daily after removing compression garments. 1. I would recommend that we continue to have the patient use her compression stocking I think that is of utmost importance but again she does not have to have any dressing over this. 2. I would recommend that she continue to elevate her legs much as possible and again wearing a compression garment I think is ideal. At night she can take this off and put on some lotion. We will see her back for follow-up visit as needed. Electronic Signature(s) Signed: 10/02/2020 11:26:49 AM By: Worthy Keeler PA-[name] Entered By: Worthy Keeler on 10/02/2020 11:26:48 Kruser, [name] ([id number]) -------------------------------------------------------------------------------- SuperBill Details Patient Name: [name]. Date of Service: 10/02/2020 Medical Record Number: [id number] Patient Account Number: [id number] Date of Birth/Sex: [birth year] (74  y.o. F) Treating RN: Carlene Coria Primary Care Provider: [name] Other Clinician: Referring Provider: [name] Treating Provider/Extender: [name] in Treatment: 13 Diagnosis Coding ICD-10 Codes Code Description I87.2 Venous insufficiency (chronic) (peripheral) S81.802A Unspecified open wound, left lower leg, initial encounter L97.822 Non-pressure chronic ulcer of other part of left lower leg with fat layer exposed [place] (primary) hypertension Facility Procedures CPT4 Code: [id number] Description: 99213 - WOUND CARE VISIT-LEV 3 EST PT Modifier: Quantity: 1 Physician Procedures CPT4 Code: [id number] Description: 36122 - WC PHYS LEVEL 3 - EST PT Modifier: Quantity: 1 CPT4 Code: Description: ICD-10 Diagnosis Description I87.2 Venous insufficiency (chronic) (peripheral) S81.802A Unspecified open wound, left lower leg, initial encounter L97.822 Non-pressure chronic ulcer of other part of left lower leg with fat lay [place]  (primary) hypertension Modifier: er exposed Quantity: Electronic Signature(s) Signed: 10/02/2020 11:27:10 AM By: Worthy Keeler PA-[name] Entered By: Worthy Keeler on 10/02/2020 11:27:09

## 2020-10-03 LAB — SARS CORONAVIRUS 2 (TAT 6-24 HRS): SARS Coronavirus 2: NEGATIVE

## 2020-10-05 NOTE — Progress Notes (Addendum)
NAYDELINE, MORACE ([id number]) Visit Report for 10/02/2020 Arrival Information Details Patient Name: [name], [name]. Date of Service: 10/02/2020 11:00 AM Medical Record Number: [id number] Patient Account Number: [id number] Date of Birth/Sex: [birth year] (75 y.o. F) Treating RN: [name] Primary Care [name]: [name] Other Clinician: Referring [name]: [name] Treating [name]/Extender: [name] in Treatment: 93 Visit Information History Since Last Visit All ordered tests and consults were completed: No Patient Arrived: Ambulatory Added or deleted any medications: No Arrival Time: 11:14 Any new allergies or adverse reactions: No Accompanied By: self Had a fall or experienced change in No Transfer Assistance: None activities of daily living that may affect Patient Identification Verified: Yes risk of falls: Secondary Verification Process Completed: Yes Signs or symptoms of abuse/neglect since last visito No Patient Requires Transmission-Based Precautions: No Hospitalized since last visit: No Patient Has Alerts: No Implantable device outside of the clinic excluding No cellular tissue based products placed in the center since last visit: Has Dressing in Place as Prescribed: Yes Pain Present Now: No Electronic Signature(s) Signed: 10/06/2020 4:27:39 PM By: Cindy Coria RN Entered By: [name] on 10/02/2020 11:18:09 [name], [name] ([id number]) -------------------------------------------------------------------------------- Clinic Level of Care Assessment Details Patient Name: [name], [name] Date of Service: 10/02/2020 11:00 AM Medical Record Number: [id number] Patient Account Number: [id number] Date of Birth/Sex: 1946-05-30 (75 y.o. F) Treating RN: [name] Primary Care [name]: [name] Other Clinician: Referring [name]: [name] Treating [name]/Extender: [name] in Treatment: 13 Clinic Level of Care  Assessment Items TOOL 4 Quantity Score X - Use when only an EandM is performed on FOLLOW-UP visit 1 0 ASSESSMENTS - Nursing Assessment / Reassessment X - Reassessment of Co-morbidities (includes updates in patient status) 1 10 X- 1 5 Reassessment of Adherence to Treatment Plan ASSESSMENTS - Wound and Skin Assessment / Reassessment X - Simple Wound Assessment / Reassessment - one wound 1 5 []  - 0 Complex Wound Assessment / Reassessment - multiple wounds []  - 0 Dermatologic / Skin Assessment (not related to wound area) ASSESSMENTS - Focused Assessment []  - Circumferential Edema Measurements - multi extremities 0 []  - 0 Nutritional Assessment / Counseling / Intervention []  - 0 Lower Extremity Assessment (monofilament, tuning fork, pulses) []  - 0 Peripheral Arterial Disease Assessment (using hand held doppler) ASSESSMENTS - Ostomy and/or Continence Assessment and Care []  - Incontinence Assessment and Management 0 []  - 0 Ostomy Care Assessment and Management (repouching, etc.) PROCESS - Coordination of Care X - Simple Patient / Family Education for ongoing care 1 15 []  - 0 Complex (extensive) Patient / Family Education for ongoing care []  - 0 Staff obtains Programmer, systems, Records, Test Results / Process Orders []  - 0 Staff telephones HHA, Nursing Homes / Clarify orders / etc []  - 0 Routine Transfer to another Facility (non-emergent condition) []  - 0 Routine Hospital Admission (non-emergent condition) []  - 0 New Admissions / Biomedical engineer / Ordering NPWT, Apligraf, etc. []  - 0 Emergency Hospital Admission (emergent condition) X- 1 10 Simple Discharge Coordination []  - 0 Complex (extensive) Discharge Coordination PROCESS - Special Needs []  - Pediatric / Minor Patient Management 0 []  - 0 Isolation Patient Management []  - 0 Hearing / Language / Visual special needs []  - 0 Assessment of Community assistance (transportation, D/C planning, etc.) []  - 0 Additional  assistance / Altered mentation []  - 0 Support Surface(s) Assessment (bed, cushion, seat, etc.) INTERVENTIONS - Wound Cleansing / Measurement [name], [name] ([id number]) X- 1 5 Simple Wound Cleansing -  one wound []  - 0 Complex Wound Cleansing - multiple wounds X- 1 5 Wound Imaging (photographs - any number of wounds) []  - 0 Wound Tracing (instead of photographs) X- 1 5 Simple Wound Measurement - one wound []  - 0 Complex Wound Measurement - multiple wounds INTERVENTIONS - Wound Dressings X - Small Wound Dressing one or multiple wounds 1 10 []  - 0 Medium Wound Dressing one or multiple wounds []  - 0 Large Wound Dressing one or multiple wounds X- 1 5 Application of Medications - topical []  - 0 Application of Medications - injection INTERVENTIONS - Miscellaneous []  - External ear exam 0 []  - 0 Specimen Collection (cultures, biopsies, blood, body fluids, etc.) []  - 0 Specimen(s) / Culture(s) sent or taken to Lab for analysis []  - 0 Patient Transfer (multiple staff / Civil Service fast streamer / Similar devices) []  - 0 Simple Staple / Suture removal (25 or less) []  - 0 Complex Staple / Suture removal (26 or more) []  - 0 Hypo / Hyperglycemic Management (close monitor of Blood Glucose) []  - 0 Ankle / Brachial Index (ABI) - do not check if billed separately X- 1 5 Vital Signs Has the patient been seen at the hospital within the last three years: Yes Total Score: 80 Level Of Care: New/Established - Level 3 Electronic Signature(s) Signed: 10/06/2020 4:27:39 PM By: Cindy Coria RN Entered By: [name] on 10/02/2020 11:23:31 [name], [name] ([id number]) -------------------------------------------------------------------------------- Encounter Discharge Information Details Patient Name: [name], [name] Date of Service: 10/02/2020 11:00 AM Medical Record Number: [id number] Patient Account Number: [id number] Date of Birth/Sex: 1945/11/28 (75 y.o. F) Treating RN: [name] Primary Care [name]: [name] Other Clinician: Referring [name]: [name] Treating Jatasia Gundrum/Extender: [name] in Treatment: 13 Encounter Discharge Information Items Discharge Condition: Stable Ambulatory Status: Ambulatory Discharge Destination: Home Transportation: Private Auto Accompanied By: self Schedule Follow-up Appointment: Yes Clinical Summary of Care: Patient Declined Electronic Signature(s) Signed: 10/06/2020 4:27:39 PM By: Cindy Coria RN Entered By: [name] on 10/02/2020 11:28:06 [name], [name] ([id number]) -------------------------------------------------------------------------------- Lower Extremity Assessment Details Patient Name: [name], [name] Date of Service: 10/02/2020 11:00 AM Medical Record Number: [id number] Patient Account Number: [id number] Date of Birth/Sex: [birth year] (75 y.o. F) Treating RN: [name] Primary Care Raquon Milledge: [name] Other Clinician: Referring Amani Nodarse: [name] Treating Kissie Ziolkowski/Extender: [name] in Treatment: 13 Edema Assessment Assessed: [Left: No] [Right: No] Edema: [Left: N] [Right: o] Calf Left: Right: Point of Measurement: 32 cm From Medial Instep 37 cm Ankle Left: Right: Point of Measurement: 10 cm From Medial Instep 23 cm Vascular Assessment Pulses: Dorsalis Pedis Palpable: [Left:Yes] Electronic Signature(s) Signed: 10/06/2020 4:27:39 PM By: Cindy Coria RN Entered By: [name] on 10/02/2020 11:21:21 [name], Maxeys. ([id number]) -------------------------------------------------------------------------------- Multi Wound Chart Details Patient Name: [name], [name] Date of Service: 10/02/2020 11:00 AM Medical Record Number: [id number] Patient Account Number: [id number] Date of Birth/Sex: [birth year] (75 y.o. F) Treating RN: [name] Primary Care [place] Lapaglia: [name] Other Clinician: Referring Elidia Bonenfant: [name] Treating  Syanna Remmert/Extender: [name] in Treatment: 13 Vital Signs Height(in): 61 Pulse(bpm): 6 Weight(lbs): 175 Blood Pressure(mmHg): 113/69 Body Mass Index(BMI): 33 Temperature(F): 98.5 Respiratory Rate(breaths/min): 18 Photos: [1:No Photos] [N/A:N/A] Wound Location: [1:Left, Anterior Lower Leg] [N/A:N/A] Wounding Event: [1:Puncture] [N/A:N/A] Primary Etiology: [1:Skin Tear] [N/A:N/A] Comorbid History: [1:Cataracts, Asthma, Hypertension, Osteoarthritis, Received Radiation] [N/A:N/A] Date Acquired: [1:05/18/2020] [N/A:N/A] Weeks of Treatment: [1:13] [N/A:N/A] Wound Status: [1:Open] [N/A:N/A] Measurements L x W x D (cm) [1:0x0x0] [N/A:N/A] Area (cm) : [1:0] [N/A:N/A]  Volume (cm) : [1:0] [N/A:N/A] % Reduction in Area: [1:100.00%] [N/A:N/A] % Reduction in Volume: [1:100.00%] [N/A:N/A] Classification: [1:Full Thickness Without Exposed Support Structures] [N/A:N/A] Exudate Amount: [1:None Present] [N/A:N/A] Wound Margin: [1:Flat and Intact] [N/A:N/A] Granulation Amount: [1:None Present (0%)] [N/A:N/A] Necrotic Amount: [1:None Present (0%)] [N/A:N/A] Exposed Structures: [1:Fascia: No Fat Layer (Subcutaneous Tissue): No Tendon: No Muscle: No Joint: No Bone: No Large (67-100%)] [N/A:N/A N/A] Treatment Notes Electronic Signature(s) Signed: 10/06/2020 4:27:39 PM By: Cindy Coria RN Entered By: [name] on 10/02/2020 11:22:29 [name], [name] ([id number]) -------------------------------------------------------------------------------- Multi-Disciplinary Care Plan Details Patient Name: [name] Date of Service: 10/02/2020 11:00 AM Medical Record Number: [id number] Patient Account Number: [id number] Date of Birth/Sex: [birth year] (75 y.o. F) Treating RN: [name] Primary Care Vershawn Westrup: [name] Other Clinician: Referring Sonora Catlin: [name] Treating Barbar Brede/Extender: [name] in Treatment: 13 Active Inactive Electronic Signature(s) Signed:  10/06/2020 4:27:39 PM By: Cindy Coria RN Entered By: [name] on 10/02/2020 11:22:20 [name], [name] ([id number]) -------------------------------------------------------------------------------- Pain Assessment Details Patient Name: Slemmer, Bellanie C. Date of Service: 10/02/2020 11:00 AM Medical Record Number: [id number] Patient Account Number: [id number] Date of Birth/Sex: 05-21-1946 (75 y.o. F) Treating RN: [name] Primary Care Sufyan Meidinger: [name] Other Clinician: Referring Giara Mcgaughey: [name] Treating Nakeda Lebron/Extender: [name] in Treatment: 13 Active Problems Location of Pain Severity and Description of Pain Patient Has Paino No Site Locations Pain Management and Medication Current Pain Management: Electronic Signature(s) Signed: 10/06/2020 4:27:39 PM By: Cindy Coria RN Entered By: [name] on 10/02/2020 11:18:35 Bruntz, [name] Kitchen ([id number]) -------------------------------------------------------------------------------- Patient/Caregiver Education Details Patient Name: [name] Date of Service: 10/02/2020 11:00 AM Medical Record Number: [id number] Patient Account Number: [id number] Date of Birth/Gender: Oct 16, 1945 (75 y.o. F) Treating RN: [name] Primary Care Physician: [name] Other Clinician: Referring Physician: Cletis [name] Treating Physician/Extender: [name] in Treatment: 13 Education Assessment Education Provided To: Patient Education Topics Provided Wound/Skin Impairment: Methods: Explain/Verbal Responses: State content correctly Electronic Signature(s) Signed: 10/06/2020 4:27:39 PM By: Cindy Coria RN Entered By: [name] on 10/02/2020 11:27:28 Wiest, Mishka C. ([id number]) -------------------------------------------------------------------------------- Wound Assessment Details Patient Name: Sciortino, Lurlean C. Date of Service: 10/02/2020 11:00 AM Medical Record Number:  [id number] Patient Account Number: [id number] Date of Birth/Sex: Apr 09, 1946 (75 y.o. F) Treating RN: [name] Primary Care Mechell Girgis: [name] Other Clinician: Referring Abbygayle Helfand: [name] Treating Thorin Starner/Extender: [name] in Treatment: 13 Wound Status Wound Number: 1 Primary Skin Tear Etiology: Wound Location: Left, Anterior Lower Leg Wound Status: Open Wounding Event: Puncture Comorbid Cataracts, Asthma, Hypertension, Osteoarthritis, Date Acquired: 05/18/2020 History: Received Radiation Weeks Of Treatment: 13 Clustered Wound: No Wound Measurements Length: (cm) 0 Width: (cm) 0 Depth: (cm) 0 Area: (cm) 0 Volume: (cm) 0 % Reduction in Area: 100% % Reduction in Volume: 100% Epithelialization: Large (67-100%) Tunneling: No Undermining: No Wound Description Classification: Full Thickness Without Exposed Support Structure Wound Margin: Flat and Intact Exudate Amount: None Present s Foul Odor After Cleansing: No Slough/Fibrino No Wound Bed Granulation Amount: None Present (0%) Exposed Structure Necrotic Amount: None Present (0%) Fascia Exposed: No Fat Layer (Subcutaneous Tissue) Exposed: No Tendon Exposed: No Muscle Exposed: No Joint Exposed: No Bone Exposed: No Treatment Notes Wound #1 (Lower Leg) Wound Laterality: Left, Anterior Cleanser Peri-Wound Care Topical Primary Dressing Secondary Dressing Secured With Compression Wrap Compression Stockings Add-Ons Electronic Signature(s) Signed: 10/06/2020 4:27:39 PM By: Cindy Coria RN Entered By: [name] on 10/02/2020 11:20:37 Rood, Daissy C. ([id number]) -------------------------------------------------------------------------------- Vitals Details Patient Name: [name]  Date of Service: 10/02/2020 11:00 AM Medical Record Number: [id number] Patient Account Number: [id number] Date of Birth/Sex: [birth year] (75 y.o. F) Treating RN: [name] Primary Care Branson Kranz:  [name] Other Clinician: Referring Hieu Herms: [name] Treating Geza Beranek/Extender: [name] in Treatment: 13 Vital Signs Time Taken: 11:18 Temperature (F): 98.5 Height (in): 61 Pulse (bpm): 79 Weight (lbs): 175 Respiratory Rate (breaths/min): 18 Body Mass Index (BMI): 33.1 Blood Pressure (mmHg): 113/69 Reference Range: 80 - 120 mg / dl Electronic Signature(s) Signed: 10/06/2020 4:27:39 PM By: Cindy Coria RN Entered By: [name] on 10/02/2020 11:18:27

## 2020-10-06 ENCOUNTER — Encounter
Admission: RE | Disposition: A | Discharge status: Home / Self Care | Payer: Self-pay | Source: Home / Self Care | Attending: Gastroenterology

## 2020-10-06 ENCOUNTER — Ambulatory Visit: Payer: Medicare Other | Admitting: Anesthesiology

## 2020-10-06 ENCOUNTER — Ambulatory Visit
Admission: RE | Admit: 2020-10-06 | Discharge: 2020-10-06 | Disposition: A | Discharge status: Home / Self Care | Payer: Medicare Other | Attending: Gastroenterology | Admitting: Gastroenterology

## 2020-10-06 ENCOUNTER — Encounter: Payer: Self-pay | Admitting: Gastroenterology

## 2020-10-06 ENCOUNTER — Other Ambulatory Visit: Payer: Self-pay

## 2020-10-06 DIAGNOSIS — Z79890 Hormone replacement therapy (postmenopausal): Secondary | ICD-10-CM | POA: Insufficient documentation

## 2020-10-06 DIAGNOSIS — Z880 Allergy status to penicillin: Secondary | ICD-10-CM | POA: Diagnosis not present

## 2020-10-06 DIAGNOSIS — K2960 Other gastritis without bleeding: Secondary | ICD-10-CM

## 2020-10-06 DIAGNOSIS — Z1211 Encounter for screening for malignant neoplasm of colon: Secondary | ICD-10-CM | POA: Insufficient documentation

## 2020-10-06 DIAGNOSIS — K2970 Gastritis, unspecified, without bleeding: Secondary | ICD-10-CM | POA: Diagnosis not present

## 2020-10-06 DIAGNOSIS — Z85850 Personal history of malignant neoplasm of thyroid: Secondary | ICD-10-CM | POA: Diagnosis not present

## 2020-10-06 DIAGNOSIS — R1310 Dysphagia, unspecified: Secondary | ICD-10-CM | POA: Insufficient documentation

## 2020-10-06 DIAGNOSIS — Z800 Family history of malignant neoplasm of digestive organs: Secondary | ICD-10-CM

## 2020-10-06 DIAGNOSIS — K5730 Diverticulosis of large intestine without perforation or abscess without bleeding: Secondary | ICD-10-CM | POA: Diagnosis not present

## 2020-10-06 DIAGNOSIS — K648 Other hemorrhoids: Secondary | ICD-10-CM

## 2020-10-06 DIAGNOSIS — K5790 Diverticulosis of intestine, part unspecified, without perforation or abscess without bleeding: Secondary | ICD-10-CM | POA: Diagnosis not present

## 2020-10-06 DIAGNOSIS — K449 Diaphragmatic hernia without obstruction or gangrene: Secondary | ICD-10-CM | POA: Insufficient documentation

## 2020-10-06 DIAGNOSIS — Z888 Allergy status to other drugs, medicaments and biological substances status: Secondary | ICD-10-CM | POA: Insufficient documentation

## 2020-10-06 DIAGNOSIS — N184 Chronic kidney disease, stage 4 (severe): Secondary | ICD-10-CM | POA: Diagnosis not present

## 2020-10-06 DIAGNOSIS — Z7982 Long term (current) use of aspirin: Secondary | ICD-10-CM | POA: Insufficient documentation

## 2020-10-06 DIAGNOSIS — Z8249 Family history of ischemic heart disease and other diseases of the circulatory system: Secondary | ICD-10-CM | POA: Insufficient documentation

## 2020-10-06 DIAGNOSIS — Z881 Allergy status to other antibiotic agents status: Secondary | ICD-10-CM | POA: Diagnosis not present

## 2020-10-06 DIAGNOSIS — I129 Hypertensive chronic kidney disease with stage 1 through stage 4 chronic kidney disease, or unspecified chronic kidney disease: Secondary | ICD-10-CM | POA: Diagnosis not present

## 2020-10-06 DIAGNOSIS — E890 Postprocedural hypothyroidism: Secondary | ICD-10-CM | POA: Insufficient documentation

## 2020-10-06 DIAGNOSIS — Z9049 Acquired absence of other specified parts of digestive tract: Secondary | ICD-10-CM | POA: Insufficient documentation

## 2020-10-06 DIAGNOSIS — Z79899 Other long term (current) drug therapy: Secondary | ICD-10-CM | POA: Diagnosis not present

## 2020-10-06 DIAGNOSIS — N189 Chronic kidney disease, unspecified: Secondary | ICD-10-CM | POA: Insufficient documentation

## 2020-10-06 DIAGNOSIS — K222 Esophageal obstruction: Secondary | ICD-10-CM | POA: Diagnosis not present

## 2020-10-06 DIAGNOSIS — R634 Abnormal weight loss: Secondary | ICD-10-CM

## 2020-10-06 DIAGNOSIS — E785 Hyperlipidemia, unspecified: Secondary | ICD-10-CM | POA: Diagnosis not present

## 2020-10-06 DIAGNOSIS — Z8052 Family history of malignant neoplasm of bladder: Secondary | ICD-10-CM | POA: Diagnosis not present

## 2020-10-06 DIAGNOSIS — Z8619 Personal history of other infectious and parasitic diseases: Secondary | ICD-10-CM | POA: Insufficient documentation

## 2020-10-06 DIAGNOSIS — R1319 Other dysphagia: Secondary | ICD-10-CM | POA: Diagnosis not present

## 2020-10-06 DIAGNOSIS — Z7951 Long term (current) use of inhaled steroids: Secondary | ICD-10-CM | POA: Diagnosis not present

## 2020-10-06 DIAGNOSIS — Z87891 Personal history of nicotine dependence: Secondary | ICD-10-CM | POA: Diagnosis not present

## 2020-10-06 HISTORY — PX: ESOPHAGOGASTRODUODENOSCOPY (EGD) WITH PROPOFOL: SHX5813

## 2020-10-06 HISTORY — DX: Fibromyalgia: M79.7

## 2020-10-06 HISTORY — PX: COLONOSCOPY WITH PROPOFOL: SHX5780

## 2020-10-06 SURGERY — COLONOSCOPY WITH PROPOFOL
Anesthesia: General

## 2020-10-06 MED ORDER — PROPOFOL 10 MG/ML IV BOLUS
INTRAVENOUS | Status: DC | PRN
  Administered 2020-10-06 (×2): 50 mg via INTRAVENOUS
  Administered 2020-10-06 (×2): 20 mg via INTRAVENOUS
  Administered 2020-10-06: 30 mg via INTRAVENOUS

## 2020-10-06 MED ORDER — LIDOCAINE HCL (CARDIAC) PF 100 MG/5ML IV SOSY
PREFILLED_SYRINGE | INTRAVENOUS | Status: DC | PRN
  Administered 2020-10-06: 80 mg via INTRAVENOUS

## 2020-10-06 MED ORDER — SODIUM CHLORIDE 0.9 % IV SOLN: INTRAVENOUS | Status: DC

## 2020-10-06 MED ORDER — PHENYLEPHRINE HCL (PRESSORS) 10 MG/ML IV SOLN
INTRAVENOUS | Status: DC | PRN
  Administered 2020-10-06 (×2): 100 ug via INTRAVENOUS

## 2020-10-06 MED ORDER — GLYCOPYRROLATE 0.2 MG/ML IJ SOLN
INTRAMUSCULAR | Status: DC | PRN
  Administered 2020-10-06: .2 mg via INTRAVENOUS

## 2020-10-06 MED ORDER — PROPOFOL 500 MG/50ML IV EMUL
INTRAVENOUS | Status: DC | PRN
  Administered 2020-10-06: 150 ug/kg/min via INTRAVENOUS

## 2020-10-06 NOTE — Anesthesia Preprocedure Evaluation (Signed)
Anesthesia Evaluation  Patient identified by MRN, date of birth, ID band Patient awake    Reviewed: Allergy & Precautions, H&P , NPO status , Patient's Chart, lab work & pertinent test results  History of Anesthesia Complications (+) PONV, Family history of anesthesia reaction and history of anesthetic complications  Airway Mallampati: III  TM Distance: <3 FB Neck ROM: full    Dental  (+) Chipped   Pulmonary neg shortness of breath, asthma , former smoker,    Pulmonary exam normal        Cardiovascular Exercise Tolerance: Good hypertension, (-) anginaNormal cardiovascular exam     Neuro/Psych  Neuromuscular disease    GI/Hepatic Neg liver ROS, GERD  Medicated and Controlled,(+) Hepatitis -  Endo/Other  Hypothyroidism   Renal/GU Renal diseasenegative Renal ROS  negative genitourinary   Musculoskeletal  (+) Arthritis , Fibromyalgia -  Abdominal   Peds  Hematology negative hematology ROS (+)   Anesthesia Other Findings Past Medical History: No date: Asthma     Comment:  WELL CONTROLLED No date: Bulging lumbar disc 1999: Cancer ([place])     Comment:  thyroid with recurrent 8469 No date: Complication of anesthesia     Comment:  HEART STOPPED DURING TONSILLECTOMY (AGE 22) DUE TO               ALLERGY TO ETHER No date: DDD (degenerative disc disease), lumbar No date: DDD (degenerative disc disease), thoracolumbar No date: Diverticulosis No date: Family history of adverse reaction to anesthesia     Comment:  PTS FATHER-UNSURE WHAT HAPPENED No date: Fibromyalgia No date: GERD (gastroesophageal reflux disease) No date: Heart murmur No date: Hepatitis     Comment:  AGE 89 "VIRAL" No date: Hypertension     Comment:  OFF MEDS CURRENTLY PER PCP ([facility]) No date: Hypothyroidism No date: Interstitial cystitis No date: Kidney disease No date: PONV (postoperative nausea and vomiting) No date: Thyroid disease  Past  Surgical History: 1984: BLADDER SURGERY [id number]: BREAST SURGERY     Comment:  mass removed April 2017: BREAST SURGERY     Comment:  Dr Jack Quarto [facility] 2011: CATARACT EXTRACTION 11/30/2015: CHOLECYSTECTOMY; N/A     Comment:  Procedure: LAPAROSCOPIC CHOLECYSTECTOMY WITH               INTRAOPERATIVE CHOLANGIOGRAM;  Surgeon: Christene Lye, MD;  Location: [facility] ORS;  Service: General;                Laterality: N/A; 2005, 2015: COLONOSCOPY 1975: DILATION AND CURETTAGE OF UTERUS 1975: EXCISIONAL HEMORRHOIDECTOMY 2011: EYE SURGERY; Left 1975: HEMORRHOID SURGERY 2015: HERNIA REPAIR 1999: THYROIDECTOMY 1952: TONSILLECTOMY     Comment:  AGE 22 No date: TONSILLECTOMY  BMI    Body Mass Index: 31.93 kg/m      Reproductive/Obstetrics negative OB ROS                             Anesthesia Physical Anesthesia Plan  ASA: III  Anesthesia Plan: General   Post-op Pain Management:    Induction: Intravenous  PONV Risk Score and Plan: Propofol infusion and TIVA  Airway Management Planned: Natural Airway and Nasal Cannula  Additional Equipment:   Intra-op Plan:   Post-operative Plan:   Informed Consent: I have reviewed the patients History and Physical, chart, labs and discussed the procedure including the risks, benefits and alternatives for the proposed  anesthesia with the patient or authorized representative who has indicated his/her understanding and acceptance.     Dental Advisory Given  Plan Discussed with: Anesthesiologist, CRNA and Surgeon  Anesthesia Plan Comments: (Patient consented for risks of anesthesia including but not limited to:  - adverse reactions to medications - risk of airway placement if required - damage to eyes, teeth, lips or other oral mucosa - nerve damage due to positioning  - sore throat or hoarseness - Damage to heart, brain, nerves, lungs, other parts of body or loss of life  Patient voiced  understanding.)        Anesthesia Quick Evaluation

## 2020-10-06 NOTE — Transfer of Care (Signed)
Immediate Anesthesia Transfer of Care Note  Patient: SAREN CORKERN  Procedure(s) Performed: COLONOSCOPY WITH PROPOFOL (N/A ) ESOPHAGOGASTRODUODENOSCOPY (EGD) WITH PROPOFOL (N/A )  Patient Location: Endoscopy Unit  Anesthesia Type:General  Level of Consciousness: awake, alert  and oriented  Airway & Oxygen Therapy: Patient Spontanous Breathing  Post-op Assessment: Report given to RN and Post -op Vital signs reviewed and stable  Post vital signs: Reviewed and stable  Last Vitals:  Vitals Value Taken Time  BP 126/70 10/06/20 1025  Temp    Pulse 89 10/06/20 1026  Resp 18 10/06/20 1026  SpO2 99 % 10/06/20 1026  Vitals shown include unvalidated device data.  Last Pain:  Vitals:   10/06/20 1024  TempSrc: Temporal  PainSc:          Complications: No complications documented.

## 2020-10-06 NOTE — Op Note (Signed)
[facility] Gastroenterology Patient Name: [name] Procedure Date: 10/06/2020 9:50 AM MRN: [id number] Account #: [id number] Date of Birth: 02-04-1946 Admit Type: Outpatient Age: 75 Room: [facility] ENDO ROOM 4 Gender: Female Note Status: Finalized Procedure:             Colonoscopy Indications:           Family history of colon cancer in a first-degree                         relative Providers:             Lucilla Lame MD, MD Referring MD:          [name], MD (Referring MD) Medicines:             Propofol per Anesthesia Complications:         No immediate complications. Procedure:             Pre-Anesthesia Assessment:                        - Prior to the procedure, a History and Physical was                         performed, and patient medications and allergies were                         reviewed. [name] tolerance of previous                         anesthesia was also reviewed. The risks and benefits                         of the procedure and the sedation options and risks                         were discussed with the patient. All questions were                         answered, and informed consent was obtained. Prior                         Anticoagulants: The patient has taken no previous                         anticoagulant or antiplatelet agents. ASA Grade                         Assessment: II - A patient with mild systemic disease.                         After reviewing the risks and benefits, the patient                         was deemed in satisfactory condition to undergo the                         procedure.  After obtaining informed consent, the colonoscope was                         passed under direct vision. Throughout the procedure,                         the patient's blood pressure, pulse, and oxygen                         saturations were monitored continuously. The                          Colonoscope was introduced through the anus and                         advanced to the the cecum, identified by appendiceal                         orifice and ileocecal valve. The colonoscopy was                         performed without difficulty. The patient tolerated                         the procedure well. The quality of the bowel                         preparation was good. Findings:      The perianal and digital rectal examinations were normal.      Multiple small-mouthed diverticula were found in the sigmoid colon.      Non-bleeding internal hemorrhoids were found during retroflexion. The       hemorrhoids were Grade I (internal hemorrhoids that do not prolapse). Impression:            - Diverticulosis in the sigmoid colon.                        - Non-bleeding internal hemorrhoids.                        - No specimens collected. Recommendation:        - Repeat colonoscopy is not recommended due to current                         age (24 years or older) for surveillance. Procedure Code(s):     --- Professional ---                        [phone], Colonoscopy, flexible; diagnostic, including                         collection of specimen(s) by brushing or washing, when                         performed (separate procedure) Diagnosis Code(s):     --- Professional ---                        Z80.0, Family history of malignant neoplasm of  digestive organs CPT copyright 2019 [employer]. All rights reserved. The codes documented in this report are preliminary and upon coder review may  be revised to meet current compliance requirements. Lucilla Lame MD, MD 10/06/2020 10:23:04 AM This report has been signed electronically. Number of Addenda: 0 Note Initiated On: 10/06/2020 9:50 AM Scope Withdrawal Time: 0 hours 6 minutes 6 seconds  Total Procedure Duration: 0 hours 10 minutes 53 seconds  Estimated Blood Loss:  Estimated blood loss: none.       [facility]

## 2020-10-06 NOTE — Interval H&P Note (Signed)
Lucilla Lame, MD [address] [place], [place] [address] Phone:[phone] Fax : [phone]  Primary Care Physician:  [name], MD Primary Gastroenterologist:  Dr. Allen Norris  Pre-Procedure History & Physical: HPI:  [name] is a 75 y.o. female is here for an endoscopy and colonoscopy.   Past Medical History:  Diagnosis Date   Asthma    WELL CONTROLLED   Bulging lumbar disc    Cancer ([place]) 1999   thyroid with recurrent 9735   Complication of anesthesia    HEART STOPPED DURING TONSILLECTOMY (AGE 49) DUE TO ALLERGY TO ETHER   DDD (degenerative disc disease), lumbar    DDD (degenerative disc disease), thoracolumbar    Diverticulosis    Family history of adverse reaction to anesthesia    PTS FATHER-UNSURE WHAT HAPPENED   Fibromyalgia    GERD (gastroesophageal reflux disease)    Heart murmur    Hepatitis    AGE 43 "VIRAL"   Hypertension    OFF MEDS CURRENTLY PER PCP (MASOUD)   Hypothyroidism    Interstitial cystitis    Kidney disease    PONV (postoperative nausea and vomiting)    Thyroid disease     Past Surgical History:  Procedure Laterality Date   BLADDER SURGERY  1984   BREAST SURGERY  3299,2426   mass removed   BREAST SURGERY  April 2017   Dr Jack Quarto [facility]   CATARACT EXTRACTION  2011   CHOLECYSTECTOMY N/A 11/30/2015   Procedure: LAPAROSCOPIC CHOLECYSTECTOMY WITH INTRAOPERATIVE CHOLANGIOGRAM;  Surgeon: Christene Lye, MD;  Location: [facility] ORS;  Service: General;  Laterality: N/A;   COLONOSCOPY  2005, 2015   [place]   EXCISIONAL [place] Left 2011   [place]  2015   [place]   AGE 49   TONSILLECTOMY      Prior to Admission medications   Medication Sig Start Date End Date Taking? Authorizing Provider  albuterol (PROVENTIL) (2.5 MG/3ML) 0.083% nebulizer solution Inhale into the lungs.   Yes [provider]  ALBUTEROL IN Inhale into the lungs as needed.   Yes [provider]  aspirin 81 MG tablet Take 81 mg by mouth daily.   Yes [provider]  Calcium 500 MG CHEW Chew by mouth.   Yes [provider]  cannabidiol (EPIDIOLEX) 100 MG/ML solution Take by mouth.   Yes [provider]  cetirizine (ZYRTEC) 10 MG tablet Take 10 mg by mouth daily.   Yes [provider]  Cholecalciferol (VITAMIN D-3) 1000 units CAPS Take by mouth daily.   Yes [provider]  EPINEPHrine 0.3 mg/0.3 mL IJ SOAJ injection See admin instructions. 02/04/19  Yes [provider]  FLUoxetine (PROZAC) 40 MG capsule Take 1 capsule (40 mg total) by mouth every morning. 09/01/20  Yes Masoud, Viann Shove, MD  fluticasone (FLONASE) 50 MCG/ACT nasal spray Place 1 spray into both nostrils daily.   Yes [provider]  Fluticasone-Salmeterol (ADVAIR) 250-50 MCG/DOSE AEPB Inhale 1 puff into the lungs 3 (three) times daily as needed.    Yes [provider]  furosemide (LASIX) 20 MG tablet Take 1 tablet (20 mg total) by mouth daily. 09/01/20  Yes Masoud, Viann Shove, MD  gentamicin cream (GARAMYCIN) 0.1 % SMARTSIG:Sparingly Topical 3 Times a Week 07/07/20  Yes [provider]  guaiFENesin (MUCINEX) 600 MG 12 hr tablet Take  by mouth 2 (two) times daily.   Yes [provider]  HYDROcodone-acetaminophen (NORCO) 7.5-325 MG tablet Take by mouth. 04/21/17  Yes [provider]  hydrOXYzine (ATARAX/VISTARIL) 25 MG tablet 1 tablet   Yes [provider]  levothyroxine (SYNTHROID) 150 MCG tablet TAKE 1 TABLET BY MOUTH DAILY 09/28/20  Yes Masoud, Viann Shove, MD  olmesartan (BENICAR) 20 MG tablet Take 20 mg by mouth as needed (IF BP IS ELEVATED).   Yes [provider]  Omega-3 Fatty Acids (FISH OIL) 1000 MG CAPS Take by mouth.   Yes [provider]  pantoprazole (PROTONIX) 40 MG tablet Take 1 tablet by mouth every morning.  04/25/14   Yes [provider]  potassium chloride SA (KLOR-CON) 20 MEQ tablet TAKE ONE TABLET BY MOUTH EVERY DAY 08/06/20  Yes Beckie Salts, FNP  predniSONE (DELTASONE) 2.5 MG tablet TAKE ONE TABLET EVERY DAY 04/21/20  Yes Masoud, Viann Shove, MD  pregabalin (LYRICA) 50 MG capsule Take by mouth. 05/19/17  Yes [provider]  PRESCRIPTION MEDICATION Allergy injections once weekly   Yes [provider]  tiZANidine (ZANAFLEX) 4 MG capsule Take 4 mg by mouth as needed.    Yes [provider]  vitamin C (ASCORBIC ACID) 500 MG tablet Take by mouth.   Yes [provider]  zolpidem (AMBIEN) 10 MG tablet Take 1 tablet (10 mg total) by mouth at bedtime. 06/16/20 09/14/20 Yes Masoud, Viann Shove, MD  polyethylene glycol-electrolytes (NULYTELY) 420 g solution Drink one 8 oz glass every 20 mins until entire container is finished starting at 5:00pm on 10/05/20 Patient not taking: Reported on 10/06/2020 09/08/20   Lucilla Lame, MD    Allergies as of 09/09/2020 - Review Complete 09/08/2020  Allergen Reaction Noted   Pollen extract Other (See Comments) 11/09/2012   Erythromycin Other (See Comments) 08/06/2020   Ether  11/26/2015   Gramineae pollens Other (See Comments) 11/09/2012   Montelukast sodium Other (See Comments) 08/06/2020   Penicillins Hives 04/09/2014   Tamsulosin  05/01/2017   Band-aid plus antibiotic [bacitracin-polymyxin b] Rash 11/27/2015    Family History  Problem Relation Age of Onset   Heart disease Mother    Cancer Father        colon   Heart disease Brother    Diabetes Maternal Aunt    Asthma Paternal Aunt    Bladder Cancer Maternal Aunt    Ovarian cancer Neg Hx    Kidney cancer Neg Hx    Prostate cancer Neg Hx     Social History   Socioeconomic History   Marital status: Married    Spouse name: Not on file   Number of children: Not on file   Years of education: Not on file   Highest education level: Not on file  Occupational History   Not on file   Tobacco Use   Smoking status: Former Smoker    Packs/day: 1.00    Years: 15.00    Pack years: 15.00    Quit date: 11/25/1985    Years since quitting: 34.8   Smokeless tobacco: Never Used  Vaping Use   Vaping Use: Never used  Substance and Sexual Activity   Alcohol use: No   Drug use: No   Sexual activity: Not Currently  Other Topics Concern   Not on file  Social History Narrative   Not on file   Social Determinants of Health   Financial Resource Strain: Not on file  Food Insecurity: Not on file  Transportation Needs: Not on  file  Physical Activity: Not on file  Stress: Not on file  Social Connections: Not on file  Intimate Partner Violence: Not on file    Review of Systems: See HPI, otherwise negative ROS  Physical Exam: BP (!) 150/89   Pulse 72   Temp (!) 97 F (36.1 C) (Temporal)   Resp 20   Ht 5\' 1"  (1.549 m)   Wt 76.7 kg   SpO2 100%   BMI 31.93 kg/m  General:   Alert,  pleasant and cooperative in NAD Head:  Normocephalic and atraumatic. Neck:  Supple; no masses or thyromegaly. Lungs:  Clear throughout to auscultation.    Heart:  Regular rate and rhythm. Abdomen:  Soft, nontender and nondistended. Normal bowel sounds, without guarding, and without rebound.   Neurologic:  Alert and  oriented x4;  grossly normal neurologically.  Impression/Plan: [name] is here for an endoscopy and colonoscopy to be performed for family history of colon cancer and dysphagia  Risks, benefits, limitations, and alternatives regarding  endoscopy and colonoscopy have been reviewed with the patient.  Questions have been answered.  All parties agreeable.   Lucilla Lame, MD  10/06/2020, 9:21 AM

## 2020-10-06 NOTE — Op Note (Signed)
[facility] Gastroenterology Patient Name: [name] Procedure Date: 10/06/2020 9:51 AM MRN: [id number] Account #: [id number] Date of Birth: 09-06-1945 Admit Type: Outpatient Age: 75 Room: [facility] ENDO ROOM 4 Gender: Female Note Status: Finalized Procedure:             Upper GI endoscopy Indications:           Dysphagia Providers:             Lucilla Lame MD, MD Referring MD:          [name], MD (Referring MD) Medicines:             Propofol per Anesthesia Complications:         No immediate complications. Procedure:             Pre-Anesthesia Assessment:                        - Prior to the procedure, a History and Physical was                         performed, and patient medications and allergies were                         reviewed. [name] tolerance of previous                         anesthesia was also reviewed. The risks and benefits                         of the procedure and the sedation options and risks                         were discussed with the patient. All questions were                         answered, and informed consent was obtained. Prior                         Anticoagulants: The patient has taken no previous                         anticoagulant or antiplatelet agents. ASA Grade                         Assessment: II - A patient with mild systemic disease.                         After reviewing the risks and benefits, the patient                         was deemed in satisfactory condition to undergo the                         procedure.                        After obtaining informed consent, the endoscope was  passed under direct vision. Throughout the procedure,                         the patient's blood pressure, pulse, and oxygen                         saturations were monitored continuously. The Endoscope                         was introduced through the mouth, and advanced to the                          second part of duodenum. The upper GI endoscopy was                         accomplished without difficulty. The patient tolerated                         the procedure well. Findings:      A medium-sized hiatal hernia was present.      The examined esophagus was normal. A TTS dilator was passed through the       scope. Dilation with a 15-16.5-18 mm balloon dilator was performed to 18       mm. The dilation site was examined following endoscope reinsertion and       showed complete resolution of luminal narrowing.      Localized moderate inflammation characterized by erythema was found in       the gastric antrum. Biopsies were taken with a cold forceps for       histology.      The examined duodenum was normal. Impression:            - Medium-sized hiatal hernia.                        - Normal esophagus. Dilated.                        - Gastritis. Biopsied.                        - Normal examined duodenum. Recommendation:        - Discharge patient to home.                        - Resume previous diet.                        - Continue present medications.                        - Await pathology results.                        - Perform a colonoscopy today. Procedure Code(s):     --- Professional ---                        [phone], Esophagogastroduodenoscopy, flexible,                         transoral; with transendoscopic balloon dilation of  esophagus (less than 30 mm diameter)                        43239, 59, Esophagogastroduodenoscopy, flexible,                         transoral; with biopsy, single or multiple Diagnosis Code(s):     --- Professional ---                        R13.10, Dysphagia, unspecified                        K29.70, Gastritis, unspecified, without bleeding CPT copyright 2019 American Medical Association. All rights reserved. The codes documented in this report are preliminary and upon coder review may  be  revised to meet current compliance requirements. Lucilla Lame MD, MD 10/06/2020 10:08:53 AM This report has been signed electronically. Number of Addenda: 0 Note Initiated On: 10/06/2020 9:51 AM Estimated Blood Loss:  Estimated blood loss: none.      [facility]

## 2020-10-06 NOTE — Anesthesia Procedure Notes (Signed)
Procedure Name: MAC Date/Time: 10/06/2020 9:57 AM Performed by: Lily Peer, Abdulraheem Pineo, CRNA Pre-anesthesia Checklist: Patient identified, Emergency Drugs available, Patient being monitored, Suction available and Timeout performed Oxygen Delivery Method: Nasal cannula Induction Type: IV induction

## 2020-10-06 NOTE — Anesthesia Postprocedure Evaluation (Signed)
Anesthesia Post Note  Patient: FELESIA STAHLECKER  Procedure(s) Performed: COLONOSCOPY WITH PROPOFOL (N/A ) ESOPHAGOGASTRODUODENOSCOPY (EGD) WITH PROPOFOL (N/A )  Patient location during evaluation: Endoscopy Anesthesia Type: General Level of consciousness: awake and alert Pain management: pain level controlled Vital Signs Assessment: post-procedure vital signs reviewed and stable Respiratory status: spontaneous breathing, nonlabored ventilation, respiratory function stable and patient connected to nasal cannula oxygen Cardiovascular status: blood pressure returned to baseline and stable Postop Assessment: no apparent nausea or vomiting Anesthetic complications: no   No complications documented.   Last Vitals:  Vitals:   10/06/20 1044 10/06/20 1054  BP: (!) 153/80 (!) 155/85  Pulse:    Resp:    Temp:    SpO2:      Last Pain:  Vitals:   10/06/20 1054  TempSrc:   PainSc: 0-No pain                 Precious Haws Piscitello

## 2020-10-07 ENCOUNTER — Encounter: Payer: Self-pay | Admitting: Gastroenterology

## 2020-10-07 LAB — SURGICAL PATHOLOGY

## 2020-10-09 DIAGNOSIS — J3081 Allergic rhinitis due to animal (cat) (dog) hair and dander: Secondary | ICD-10-CM | POA: Diagnosis not present

## 2020-10-09 DIAGNOSIS — J3089 Other allergic rhinitis: Secondary | ICD-10-CM | POA: Diagnosis not present

## 2020-10-09 DIAGNOSIS — J301 Allergic rhinitis due to pollen: Secondary | ICD-10-CM | POA: Diagnosis not present

## 2020-10-12 ENCOUNTER — Other Ambulatory Visit: Payer: Self-pay | Admitting: Internal Medicine

## 2020-10-12 DIAGNOSIS — I10 Essential (primary) hypertension: Secondary | ICD-10-CM | POA: Diagnosis not present

## 2020-10-12 DIAGNOSIS — N1831 Chronic kidney disease, stage 3a: Secondary | ICD-10-CM | POA: Diagnosis not present

## 2020-10-12 DIAGNOSIS — R600 Localized edema: Secondary | ICD-10-CM | POA: Diagnosis not present

## 2020-10-14 ENCOUNTER — Telehealth: Payer: Self-pay

## 2020-10-14 NOTE — Telephone Encounter (Signed)
Pt notified of results and scheduled follow up appt.

## 2020-10-14 NOTE — Telephone Encounter (Signed)
-----   Message from Lucilla Lame, MD sent at 10/12/2020  1:07 PM EDT ----- Please have the patient come in for a follow up.

## 2020-10-15 ENCOUNTER — Other Ambulatory Visit: Payer: Self-pay

## 2020-10-15 ENCOUNTER — Ambulatory Visit (INDEPENDENT_AMBULATORY_CARE_PROVIDER_SITE_OTHER): Payer: Medicare Other | Admitting: Gastroenterology

## 2020-10-15 ENCOUNTER — Encounter: Payer: Self-pay | Admitting: Gastroenterology

## 2020-10-15 VITALS — BP 122/72 | HR 82 | Ht 61.0 in | Wt 171.8 lb

## 2020-10-15 DIAGNOSIS — K31A0 Gastric intestinal metaplasia, unspecified: Secondary | ICD-10-CM

## 2020-10-15 DIAGNOSIS — R110 Nausea: Secondary | ICD-10-CM

## 2020-10-15 DIAGNOSIS — R1319 Other dysphagia: Secondary | ICD-10-CM | POA: Diagnosis not present

## 2020-10-15 DIAGNOSIS — J301 Allergic rhinitis due to pollen: Secondary | ICD-10-CM | POA: Diagnosis not present

## 2020-10-15 DIAGNOSIS — J3089 Other allergic rhinitis: Secondary | ICD-10-CM | POA: Diagnosis not present

## 2020-10-15 MED ORDER — PANTOPRAZOLE SODIUM 40 MG PO TBEC: 40.0000 mg | DELAYED_RELEASE_TABLET | Freq: Every day | ORAL | 3 refills | Status: DC

## 2020-10-15 NOTE — Progress Notes (Signed)
Primary Care Physician: [name], MD  Primary Gastroenterologist:  Dr. Lucilla Lame  Chief Complaint  Patient presents with  . Follow up procedure results    HPI: [name] is a 75 y.o. female here for follow-up after having EGD and colonoscopy.  The patient had biopsies of the stomach that showed gastric intestinal metaplasia.  The esophagus was normal and was dilated due to the patient's report of dysphagia.  The patient reports some morning nausea and does report some food going down slowly but she has not had food gets stuck and had to bring it up.  Past Medical History:  Diagnosis Date  . Asthma    WELL CONTROLLED  . Bulging lumbar disc   . Cancer [facility]) 1999   thyroid with recurrent 2006  . Complication of anesthesia    HEART STOPPED DURING TONSILLECTOMY (AGE 39) DUE TO ALLERGY TO ETHER  . DDD (degenerative disc disease), lumbar   . DDD (degenerative disc disease), thoracolumbar   . Diverticulosis   . Family history of adverse reaction to anesthesia    PTS FATHER-UNSURE WHAT HAPPENED  . Fibromyalgia   . GERD (gastroesophageal reflux disease)   . Heart murmur   . Hepatitis    AGE 38 "VIRAL"  . Hypertension    OFF MEDS CURRENTLY PER PCP (MASOUD)  . Hypothyroidism   . Interstitial cystitis   . Kidney disease   . PONV (postoperative nausea and vomiting)   . Thyroid disease     Current Outpatient Medications  Medication Sig Dispense Refill  . albuterol (PROVENTIL) (2.5 MG/3ML) 0.083% nebulizer solution Inhale into the lungs.    . ALBUTEROL IN Inhale into the lungs as needed.    Marland Kitchen aspirin 81 MG tablet Take 81 mg by mouth daily.    . Calcium 500 MG CHEW Chew by mouth.    . cannabidiol (EPIDIOLEX) 100 MG/ML solution Take by mouth.    . cetirizine (ZYRTEC) 10 MG tablet Take 10 mg by mouth daily.    . Cholecalciferol (VITAMIN D-3) 1000 units CAPS Take by mouth daily.    Marland Kitchen EPINEPHrine 0.3 mg/0.3 mL IJ SOAJ injection See admin instructions.    Marland Kitchen FLUoxetine  (PROZAC) 40 MG capsule Take 1 capsule (40 mg total) by mouth every morning. 90 capsule 0  . fluticasone (FLONASE) 50 MCG/ACT nasal spray Place 1 spray into both nostrils daily.    . Fluticasone-Salmeterol (ADVAIR) 250-50 MCG/DOSE AEPB Inhale 1 puff into the lungs 3 (three) times daily as needed.     . furosemide (LASIX) 20 MG tablet Take 1 tablet (20 mg total) by mouth daily. 90 tablet 3  . gentamicin cream (GARAMYCIN) 0.1 % SMARTSIG:Sparingly Topical 3 Times a Week    . guaiFENesin (MUCINEX) 600 MG 12 hr tablet Take by mouth 2 (two) times daily.    Marland Kitchen HYDROcodone-acetaminophen (NORCO) 7.5-325 MG tablet Take by mouth.    . hydrOXYzine (ATARAX/VISTARIL) 25 MG tablet 1 tablet    . levothyroxine (SYNTHROID) 150 MCG tablet TAKE 1 TABLET BY MOUTH DAILY 90 tablet 3  . olmesartan (BENICAR) 20 MG tablet Take 20 mg by mouth as needed (IF BP IS ELEVATED).    . Omega-3 Fatty Acids (FISH OIL) 1000 MG CAPS Take by mouth.    . polyethylene glycol-electrolytes (NULYTELY) 420 g solution Drink one 8 oz glass every 20 mins until entire container is finished starting at 5:00pm on 10/05/20 4000 mL 0  . potassium chloride SA (KLOR-CON) 20 MEQ tablet TAKE ONE  TABLET BY MOUTH EVERY DAY 90 tablet 1  . predniSONE (DELTASONE) 2.5 MG tablet TAKE ONE TABLET EVERY DAY 30 tablet 6  . pregabalin (LYRICA) 50 MG capsule Take by mouth.    Marland Kitchen PRESCRIPTION MEDICATION Allergy injections once weekly    . tiZANidine (ZANAFLEX) 4 MG capsule Take 4 mg by mouth as needed.     . vitamin C (ASCORBIC ACID) 500 MG tablet Take by mouth.    . pantoprazole (PROTONIX) 40 MG tablet Take 1 tablet (40 mg total) by mouth daily. 90 tablet 3  . zolpidem (AMBIEN) 10 MG tablet Take 1 tablet (10 mg total) by mouth at bedtime. 90 tablet 1   No current facility-administered medications for this visit.    Allergies as of 10/15/2020 - Review Complete 10/15/2020  Allergen Reaction Noted  . Pollen extract Other (See Comments) 11/09/2012  . Erythromycin  Other (See Comments) 08/06/2020  . Ether  11/26/2015  . Gramineae pollens Other (See Comments) 11/09/2012  . Montelukast sodium Other (See Comments) 08/06/2020  . Penicillins Hives 04/09/2014  . Tamsulosin  05/01/2017  . Band-aid plus antibiotic [bacitracin-polymyxin b] Rash 11/27/2015    ROS:  General: Negative for anorexia, weight loss, fever, chills, fatigue, weakness. ENT: Negative for hoarseness, difficulty swallowing , nasal congestion. CV: Negative for chest pain, angina, palpitations, dyspnea on exertion, peripheral edema.  Respiratory: Negative for dyspnea at rest, dyspnea on exertion, cough, sputum, wheezing.  GI: See history of present illness. GU:  Negative for dysuria, hematuria, urinary incontinence, urinary frequency, nocturnal urination.  Endo: Negative for unusual weight change.    Physical Examination:   BP 122/72   Pulse 82   Ht 5\' 1"  (1.549 m)   Wt 171 lb 12.8 oz (77.9 kg)   BMI 32.46 kg/m   General: Well-nourished, well-developed in no acute distress.  Eyes: No icterus. Conjunctivae pink. Neuro: Alert and oriented x 3.  Grossly intact. Skin: Warm and dry, no jaundice.   Psych: Alert and cooperative, normal mood and affect.  Labs:    Imaging Studies: No results found.  Assessment and Plan:   [name] is a 75 y.o. y/o female who comes in today with gastric test metaplasia on her upper endoscopy.  The patient has some nausea in the morning and may be having nocturnal acid reflux causing her nausea.  The patient has been told to take the Protonix at night before she goes to sleep so that her blood concentration will be highest while she is supine.  The patient has also been told that if she is doing well in 3 years she may need an upper endoscopy to evaluate any progression of her gastric test metaplasia.  The patient has been explained the plan and agrees with it.     Lucilla Lame, MD. Marval Regal    Note: This dictation was prepared with Dragon  dictation along with smaller phrase technology. Any transcriptional errors that result from this process are unintentional.

## 2020-10-20 ENCOUNTER — Other Ambulatory Visit: Payer: Self-pay

## 2020-10-20 ENCOUNTER — Ambulatory Visit (INDEPENDENT_AMBULATORY_CARE_PROVIDER_SITE_OTHER): Payer: Medicare Other | Admitting: Vascular Surgery

## 2020-10-20 VITALS — BP 117/74 | HR 79 | Ht 62.0 in | Wt 172.0 lb

## 2020-10-20 DIAGNOSIS — L97822 Non-pressure chronic ulcer of other part of left lower leg with fat layer exposed: Secondary | ICD-10-CM | POA: Diagnosis not present

## 2020-10-20 DIAGNOSIS — I83028 Varicose veins of left lower extremity with ulcer other part of lower leg: Secondary | ICD-10-CM

## 2020-10-20 NOTE — Progress Notes (Signed)
[name] is a 75 y.o. female who presents with symptomatic venous reflux  Past Medical History:  Diagnosis Date  . Asthma    WELL CONTROLLED  . Bulging lumbar disc   . Cancer [facility]) 1999   thyroid with recurrent 2006  . Complication of anesthesia    HEART STOPPED DURING TONSILLECTOMY (AGE 89) DUE TO ALLERGY TO ETHER  . DDD (degenerative disc disease), lumbar   . DDD (degenerative disc disease), thoracolumbar   . Diverticulosis   . Family history of adverse reaction to anesthesia    PTS FATHER-UNSURE WHAT HAPPENED  . Fibromyalgia   . GERD (gastroesophageal reflux disease)   . Heart murmur   . Hepatitis    AGE 52 "VIRAL"  . Hypertension    OFF MEDS CURRENTLY PER PCP (MASOUD)  . Hypothyroidism   . Interstitial cystitis   . Kidney disease   . PONV (postoperative nausea and vomiting)   . Thyroid disease     Past Surgical History:  Procedure Laterality Date  . BLADDER SURGERY  1984  . BREAST SURGERY  [id number]   mass removed  . BREAST SURGERY  April 2017   Dr Jack Quarto [facility]  . CATARACT EXTRACTION  2011  . CHOLECYSTECTOMY N/A 11/30/2015   Procedure: LAPAROSCOPIC CHOLECYSTECTOMY WITH INTRAOPERATIVE CHOLANGIOGRAM;  Surgeon: Christene Lye, MD;  Location: [facility] ORS;  Service: General;  Laterality: N/A;  . COLONOSCOPY  2005, 2015  . COLONOSCOPY WITH PROPOFOL N/A 10/06/2020   Procedure: COLONOSCOPY WITH PROPOFOL;  Surgeon: Lucilla Lame, MD;  Location: [facility] ENDOSCOPY;  Service: Endoscopy;  Laterality: N/A;  . DILATION AND CURETTAGE OF UTERUS  1975  . ESOPHAGOGASTRODUODENOSCOPY (EGD) WITH PROPOFOL N/A 10/06/2020   Procedure: ESOPHAGOGASTRODUODENOSCOPY (EGD) WITH PROPOFOL;  Surgeon: Lucilla Lame, MD;  Location: [facility] ENDOSCOPY;  Service: Endoscopy;  Laterality: N/A;  . EXCISIONAL HEMORRHOIDECTOMY  1975  . EYE SURGERY Left 2011  . [place]  . HERNIA REPAIR  2015  . THYROIDECTOMY  1999  . TONSILLECTOMY  1952   AGE 89  . TONSILLECTOMY       Current  Outpatient Medications:  .  albuterol (PROVENTIL) (2.5 MG/3ML) 0.083% nebulizer solution, Inhale into the lungs., Disp: , Rfl:  .  ALBUTEROL IN, Inhale into the lungs as needed., Disp: , Rfl:  .  ALPRAZolam (XANAX) 0.5 MG tablet, SMARTSIG:1 Tablet(s) By Mouth, Disp: , Rfl:  .  amitriptyline (ELAVIL) 25 MG tablet, Take by mouth., Disp: , Rfl:  .  aspirin 81 MG tablet, Take 81 mg by mouth daily., Disp: , Rfl:  .  Calcium 500 MG CHEW, Chew by mouth., Disp: , Rfl:  .  cannabidiol (EPIDIOLEX) 100 MG/ML solution, Take by mouth., Disp: , Rfl:  .  cetirizine (ZYRTEC) 10 MG tablet, Take 10 mg by mouth daily., Disp: , Rfl:  .  Cholecalciferol (VITAMIN D-3) 1000 units CAPS, Take by mouth daily., Disp: , Rfl:  .  cyclobenzaprine (FLEXERIL) 5 MG tablet, 1/2-1 po bid prn, Disp: , Rfl:  .  EPINEPHrine 0.3 mg/0.3 mL IJ SOAJ injection, See admin instructions., Disp: , Rfl:  .  FLUoxetine (PROZAC) 40 MG capsule, Take 1 capsule (40 mg total) by mouth every morning., Disp: 90 capsule, Rfl: 0 .  fluticasone (FLONASE) 50 MCG/ACT nasal spray, Place 1 spray into both nostrils daily., Disp: , Rfl:  .  Fluticasone-Salmeterol (ADVAIR) 250-50 MCG/DOSE AEPB, Inhale 1 puff into the lungs 3 (three) times daily as needed. , Disp: , Rfl:  .  furosemide (LASIX) 20  MG tablet, Take 1 tablet (20 mg total) by mouth daily., Disp: 90 tablet, Rfl: 3 .  gentamicin cream (GARAMYCIN) 0.1 %, SMARTSIG:Sparingly Topical 3 Times a Week, Disp: , Rfl:  .  guaiFENesin (MUCINEX) 600 MG 12 hr tablet, Take by mouth 2 (two) times daily., Disp: , Rfl:  .  HYDROcodone-acetaminophen (NORCO) 7.5-325 MG tablet, Take by mouth., Disp: , Rfl:  .  hydrOXYzine (ATARAX/VISTARIL) 25 MG tablet, 1 tablet, Disp: , Rfl:  .  levothyroxine (SYNTHROID) 150 MCG tablet, TAKE 1 TABLET BY MOUTH DAILY, Disp: 90 tablet, Rfl: 3 .  olmesartan (BENICAR) 20 MG tablet, Take 20 mg by mouth as needed (IF BP IS ELEVATED)., Disp: , Rfl:  .  Omega-3 Fatty Acids (FISH OIL) 1000 MG  CAPS, Take by mouth., Disp: , Rfl:  .  pantoprazole (PROTONIX) 40 MG tablet, Take 1 tablet (40 mg total) by mouth daily., Disp: 90 tablet, Rfl: 3 .  polyethylene glycol-electrolytes (NULYTELY) 420 g solution, Drink one 8 oz glass every 20 mins until entire container is finished starting at 5:00pm on 10/05/20, Disp: 4000 mL, Rfl: 0 .  potassium chloride SA (KLOR-CON) 20 MEQ tablet, TAKE ONE TABLET BY MOUTH EVERY DAY, Disp: 90 tablet, Rfl: 1 .  predniSONE (DELTASONE) 2.5 MG tablet, TAKE ONE TABLET EVERY DAY, Disp: 30 tablet, Rfl: 6 .  PRESCRIPTION MEDICATION, Allergy injections once weekly, Disp: , Rfl:  .  tiZANidine (ZANAFLEX) 4 MG capsule, Take 4 mg by mouth as needed. , Disp: , Rfl:  .  vitamin C (ASCORBIC ACID) 500 MG tablet, Take by mouth., Disp: , Rfl:  .  pregabalin (LYRICA) 50 MG capsule, Take by mouth. (Patient not taking: Reported on 10/20/2020), Disp: , Rfl:  .  zolpidem (AMBIEN) 10 MG tablet, Take 1 tablet (10 mg total) by mouth at bedtime., Disp: 90 tablet, Rfl: 1  Allergies  Allergen Reactions  . Pollen Extract Other (See Comments)  . Erythromycin Other (See Comments)  . Ether     "HEART STOPPED WHILE IN SURGERY"  . Gramineae Pollens Other (See Comments)  . Montelukast Sodium Other (See Comments)  . Penicillins Hives  . Tamsulosin     Other reaction(s): Other (See Comments) "made me loopy"  . Band-Aid Plus Antibiotic [Bacitracin-Polymyxin B] Rash     Varicose veins of lower extremities with ulcer ([place])     PLAN: [name] left  lower extremity was sterilely prepped and draped. The ultrasound machine was used to visualize the saphenous vein throughout its course. A segment in the upper calf was selected for access. The saphenous vein was accessed without difficulty using ultrasound guidance with a micropuncture needle. A 0.018 wire was then placed beyond the saphenofemoral junction and the needle was removed. The 65 cm sheath was then placed over the wire and the wire and  dilator were removed. The laser fiber was then placed through the sheath and its tip was placed approximately 5 centimeters below the saphenofemoral junction. Tumescent anesthesia was then created with a dilute lidocaine solution. Laser energy was then delivered with constant withdrawal of the sheath and laser fiber. Approximately 1270 joules of energy were delivered over a length of 32 centimeters using a 1470 Hz VenaCure machine at 7 W. Sterile dressings were placed. The patient tolerated the procedure well without obvious complications.   Follow-up in 1 week with post-laser duplex.

## 2020-10-27 ENCOUNTER — Ambulatory Visit (INDEPENDENT_AMBULATORY_CARE_PROVIDER_SITE_OTHER): Payer: Medicare Other

## 2020-10-27 ENCOUNTER — Other Ambulatory Visit: Payer: Self-pay

## 2020-10-27 DIAGNOSIS — L97822 Non-pressure chronic ulcer of other part of left lower leg with fat layer exposed: Secondary | ICD-10-CM | POA: Diagnosis not present

## 2020-10-27 DIAGNOSIS — I83028 Varicose veins of left lower extremity with ulcer other part of lower leg: Secondary | ICD-10-CM | POA: Diagnosis not present

## 2020-10-27 DIAGNOSIS — J3089 Other allergic rhinitis: Secondary | ICD-10-CM | POA: Diagnosis not present

## 2020-10-27 DIAGNOSIS — J301 Allergic rhinitis due to pollen: Secondary | ICD-10-CM | POA: Diagnosis not present

## 2020-10-29 DIAGNOSIS — L400 Psoriasis vulgaris: Secondary | ICD-10-CM | POA: Diagnosis not present

## 2020-10-29 DIAGNOSIS — D2339 Other benign neoplasm of skin of other parts of face: Secondary | ICD-10-CM | POA: Diagnosis not present

## 2020-10-29 DIAGNOSIS — L820 Inflamed seborrheic keratosis: Secondary | ICD-10-CM | POA: Diagnosis not present

## 2020-10-29 DIAGNOSIS — L821 Other seborrheic keratosis: Secondary | ICD-10-CM | POA: Diagnosis not present

## 2020-10-29 DIAGNOSIS — L538 Other specified erythematous conditions: Secondary | ICD-10-CM | POA: Diagnosis not present

## 2020-11-03 DIAGNOSIS — J301 Allergic rhinitis due to pollen: Secondary | ICD-10-CM | POA: Diagnosis not present

## 2020-11-03 DIAGNOSIS — J3089 Other allergic rhinitis: Secondary | ICD-10-CM | POA: Diagnosis not present

## 2020-11-13 DIAGNOSIS — J3089 Other allergic rhinitis: Secondary | ICD-10-CM | POA: Diagnosis not present

## 2020-11-13 DIAGNOSIS — M2041 Other hammer toe(s) (acquired), right foot: Secondary | ICD-10-CM | POA: Diagnosis not present

## 2020-11-13 DIAGNOSIS — M2042 Other hammer toe(s) (acquired), left foot: Secondary | ICD-10-CM | POA: Diagnosis not present

## 2020-11-13 DIAGNOSIS — J301 Allergic rhinitis due to pollen: Secondary | ICD-10-CM | POA: Diagnosis not present

## 2020-11-17 ENCOUNTER — Encounter (INDEPENDENT_AMBULATORY_CARE_PROVIDER_SITE_OTHER): Payer: Self-pay | Admitting: Vascular Surgery

## 2020-11-17 ENCOUNTER — Ambulatory Visit (INDEPENDENT_AMBULATORY_CARE_PROVIDER_SITE_OTHER): Payer: Medicare Other | Admitting: Vascular Surgery

## 2020-11-17 ENCOUNTER — Other Ambulatory Visit: Payer: Self-pay

## 2020-11-17 ENCOUNTER — Other Ambulatory Visit: Payer: Self-pay | Admitting: Internal Medicine

## 2020-11-17 VITALS — BP 153/91 | HR 84 | Resp 16 | Wt 175.0 lb

## 2020-11-17 DIAGNOSIS — I10 Essential (primary) hypertension: Secondary | ICD-10-CM | POA: Diagnosis not present

## 2020-11-17 DIAGNOSIS — L97221 Non-pressure chronic ulcer of left calf limited to breakdown of skin: Secondary | ICD-10-CM | POA: Diagnosis not present

## 2020-11-17 DIAGNOSIS — L97822 Non-pressure chronic ulcer of other part of left lower leg with fat layer exposed: Secondary | ICD-10-CM

## 2020-11-17 DIAGNOSIS — J301 Allergic rhinitis due to pollen: Secondary | ICD-10-CM | POA: Diagnosis not present

## 2020-11-17 DIAGNOSIS — N1832 Chronic kidney disease, stage 3b: Secondary | ICD-10-CM

## 2020-11-17 DIAGNOSIS — I83028 Varicose veins of left lower extremity with ulcer other part of lower leg: Secondary | ICD-10-CM | POA: Diagnosis not present

## 2020-11-17 DIAGNOSIS — J3089 Other allergic rhinitis: Secondary | ICD-10-CM | POA: Diagnosis not present

## 2020-11-17 NOTE — Progress Notes (Signed)
MRN : [id number]  [name] is a 75 y.o. (01-22-1946) female who presents with chief complaint of  Chief Complaint  Patient presents with  . Follow-up    4wk post laser  .  History of Present Illness: Patient returns today in follow up of her venous insufficiency.  About 4 weeks ago, she underwent left great saphenous vein laser ablation.  Her postprocedural duplex showed a successful ablation without DVT.  She does not notice improvement in terms of pain and swelling in that leg.  She says it also has a better appearance.  She does still have some prominent residual varicosities which are locally painful and irritating in her left calf and lower leg.  Current Outpatient Medications  Medication Sig Dispense Refill  . albuterol (PROVENTIL) (2.5 MG/3ML) 0.083% nebulizer solution Inhale into the lungs.    . ALBUTEROL IN Inhale into the lungs as needed.    . ALPRAZolam (XANAX) 0.5 MG tablet SMARTSIG:1 Tablet(s) By Mouth    . amitriptyline (ELAVIL) 25 MG tablet Take by mouth.    Marland Kitchen aspirin 81 MG tablet Take 81 mg by mouth daily.    . Calcium 500 MG CHEW Chew by mouth.    . cannabidiol (EPIDIOLEX) 100 MG/ML solution Take by mouth.    . cetirizine (ZYRTEC) 10 MG tablet Take 10 mg by mouth daily.    . Cholecalciferol (VITAMIN D-3) 1000 units CAPS Take by mouth daily.    . cyclobenzaprine (FLEXERIL) 5 MG tablet 1/2-1 po bid prn    . EPINEPHrine 0.3 mg/0.3 mL IJ SOAJ injection See admin instructions.    Marland Kitchen FLUoxetine (PROZAC) 40 MG capsule TAKE 1 CAPSULE BY MOUTH EVERY MORNING 90 capsule 0  . fluticasone (FLONASE) 50 MCG/ACT nasal spray Place 1 spray into both nostrils daily.    . Fluticasone-Salmeterol (ADVAIR) 250-50 MCG/DOSE AEPB Inhale 1 puff into the lungs 3 (three) times daily as needed.     . furosemide (LASIX) 20 MG tablet Take 1 tablet (20 mg total) by mouth daily. 90 tablet 3  . gentamicin cream (GARAMYCIN) 0.1 % SMARTSIG:Sparingly Topical 3 Times a Week    . guaiFENesin  (MUCINEX) 600 MG 12 hr tablet Take by mouth 2 (two) times daily.    Marland Kitchen HYDROcodone-acetaminophen (NORCO) 7.5-325 MG tablet Take by mouth.    . hydrOXYzine (ATARAX/VISTARIL) 25 MG tablet 1 tablet    . levothyroxine (SYNTHROID) 150 MCG tablet TAKE 1 TABLET BY MOUTH DAILY 90 tablet 3  . olmesartan (BENICAR) 20 MG tablet Take 20 mg by mouth as needed (IF BP IS ELEVATED).    . Omega-3 Fatty Acids (FISH OIL) 1000 MG CAPS Take by mouth.    . pantoprazole (PROTONIX) 40 MG tablet Take 1 tablet (40 mg total) by mouth daily. 90 tablet 3  . polyethylene glycol-electrolytes (NULYTELY) 420 g solution Drink one 8 oz glass every 20 mins until entire container is finished starting at 5:00pm on 10/05/20 4000 mL 0  . potassium chloride SA (KLOR-CON) 20 MEQ tablet TAKE ONE TABLET BY MOUTH EVERY DAY 90 tablet 1  . predniSONE (DELTASONE) 2.5 MG tablet TAKE ONE TABLET EVERY DAY 30 tablet 6  . PRESCRIPTION MEDICATION Allergy injections once weekly    . vitamin C (ASCORBIC ACID) 500 MG tablet Take by mouth.    . pregabalin (LYRICA) 50 MG capsule Take by mouth. (Patient not taking: No sig reported)    . tiZANidine (ZANAFLEX) 4 MG capsule Take 4 mg by mouth as needed.  (Patient  not taking: Reported on 11/17/2020)    . zolpidem (AMBIEN) 10 MG tablet Take 1 tablet (10 mg total) by mouth at bedtime. 90 tablet 1   No current facility-administered medications for this visit.    Past Medical History:  Diagnosis Date  . Asthma    WELL CONTROLLED  . Bulging lumbar disc   . Cancer [facility]) 1999   thyroid with recurrent 2006  . Complication of anesthesia    HEART STOPPED DURING TONSILLECTOMY (AGE 42) DUE TO ALLERGY TO ETHER  . DDD (degenerative disc disease), lumbar   . DDD (degenerative disc disease), thoracolumbar   . Diverticulosis   . Family history of adverse reaction to anesthesia    PTS FATHER-UNSURE WHAT HAPPENED  . Fibromyalgia   . GERD (gastroesophageal reflux disease)   . Heart murmur   . Hepatitis    AGE 80  "VIRAL"  . Hypertension    OFF MEDS CURRENTLY PER PCP (MASOUD)  . Hypothyroidism   . Interstitial cystitis   . Kidney disease   . PONV (postoperative nausea and vomiting)   . Thyroid disease     Past Surgical History:  Procedure Laterality Date  . BLADDER SURGERY  1984  . BREAST SURGERY  [id number]   mass removed  . BREAST SURGERY  April 2017   Dr Jack Quarto [facility]  . CATARACT EXTRACTION  2011  . CHOLECYSTECTOMY N/A 11/30/2015   Procedure: LAPAROSCOPIC CHOLECYSTECTOMY WITH INTRAOPERATIVE CHOLANGIOGRAM;  Surgeon: Christene Lye, MD;  Location: [facility] ORS;  Service: General;  Laterality: N/A;  . COLONOSCOPY  2005, 2015  . COLONOSCOPY WITH PROPOFOL N/A 10/06/2020   Procedure: COLONOSCOPY WITH PROPOFOL;  Surgeon: Lucilla Lame, MD;  Location: [facility] ENDOSCOPY;  Service: Endoscopy;  Laterality: N/A;  . DILATION AND CURETTAGE OF UTERUS  1975  . ESOPHAGOGASTRODUODENOSCOPY (EGD) WITH PROPOFOL N/A 10/06/2020   Procedure: ESOPHAGOGASTRODUODENOSCOPY (EGD) WITH PROPOFOL;  Surgeon: Lucilla Lame, MD;  Location: [facility] ENDOSCOPY;  Service: Endoscopy;  Laterality: N/A;  . EXCISIONAL HEMORRHOIDECTOMY  1975  . EYE SURGERY Left 2011  . [place]  . HERNIA REPAIR  2015  . THYROIDECTOMY  1999  . TONSILLECTOMY  1952   AGE 42  . TONSILLECTOMY       Social History   Tobacco Use  . Smoking status: Former Smoker    Packs/day: 1.00    Years: 15.00    Pack years: 15.00    Quit date: 11/25/1985    Years since quitting: 35.0  . Smokeless tobacco: Never Used  Vaping Use  . Vaping Use: Never used  Substance Use Topics  . Alcohol use: No  . Drug use: No      Family History  Problem Relation Age of Onset  . Heart disease Mother   . Cancer Father        colon  . Heart disease Brother   . Diabetes Maternal Aunt   . Asthma Paternal Aunt   . Bladder Cancer Maternal Aunt   . Ovarian cancer Neg Hx   . Kidney cancer Neg Hx   . Prostate cancer Neg Hx     Allergies  Allergen Reactions   . Pollen Extract Other (See Comments)  . Erythromycin Other (See Comments)  . Ether     "HEART STOPPED WHILE IN SURGERY"  . Gramineae Pollens Other (See Comments)  . Montelukast Sodium Other (See Comments)  . Penicillins Hives  . Tamsulosin     Other reaction(s): Other (See Comments) "made me loopy"  . Band-Aid Plus  Antibiotic [Bacitracin-Polymyxin B] Rash     REVIEW OF SYSTEMS (Negative unless checked)  Constitutional: [] Weight loss  [] Fever  [] Chills Cardiac: [] Chest pain   [] Chest pressure   [] Palpitations   [] Shortness of breath when laying flat   [] Shortness of breath at rest   [] Shortness of breath with exertion. Vascular:  [] Pain in legs with walking   [] Pain in legs at rest   [] Pain in legs when laying flat   [] Claudication   [] Pain in feet when walking  [] Pain in feet at rest  [] Pain in feet when laying flat   [] History of DVT   [] Phlebitis   [x] Swelling in legs   [x] Varicose veins   [] Non-healing ulcers Pulmonary:   [] Uses home oxygen   [] Productive cough   [] Hemoptysis   [] Wheeze  [] COPD   [] Asthma Neurologic:  [] Dizziness  [] Blackouts   [] Seizures   [] History of stroke   [] History of TIA  [] Aphasia   [] Temporary blindness   [] Dysphagia   [] Weakness or numbness in arms   [] Weakness or numbness in legs Musculoskeletal:  [x] Arthritis   [] Joint swelling   [x] Joint pain   [] Low back pain Hematologic:  [] Easy bruising  [] Easy bleeding   [] Hypercoagulable state   [] Anemic   Gastrointestinal:  [] Blood in stool   [] Vomiting blood  [x] Gastroesophageal reflux/heartburn   [] Abdominal pain Genitourinary:  [] Chronic kidney disease   [] Difficult urination  [] Frequent urination  [] Burning with urination   [] Hematuria Skin:  [] Rashes   [] Ulcers   [] Wounds Psychological:  [] History of anxiety   []  History of major depression.  Physical Examination  BP (!) 153/91 (BP Location: Right Arm)   Pulse 84   Resp 16   Wt 175 lb (79.4 kg)   BMI 32.01 kg/m  Gen:  WD/WN, NAD.  Appears younger  than stated age Head: Liberal/AT, No temporalis wasting. Ear/Nose/Throat: Hearing grossly intact, nares w/o erythema or drainage Eyes: Conjunctiva clear. Sclera non-icteric Neck: Supple.  Trachea midline Pulmonary:  Good air movement, no use of accessory muscles.  Cardiac: RRR, no JVD Vascular: Prominent varicosities bilaterally. Vessel Right Left  Radial Palpable Palpable                          PT Palpable Palpable  DP Palpable Palpable   Gastrointestinal: soft, non-tender/non-distended. No guarding/reflex.  Musculoskeletal: M/S 5/5 throughout.  No deformity or atrophy.  Trace lower extremity edema. Neurologic: Sensation grossly intact in extremities.  Symmetrical.  Speech is fluent.  Psychiatric: Judgment intact, Mood & affect appropriate for pt's clinical situation. Dermatologic: No rashes or ulcers noted.  No cellulitis or open wounds.  Previous ulceration has healed       Labs Recent Results (from the past 2160 hour(s))  POCT urinalysis dipstick     Status: Abnormal   Collection Time: 09/01/20 11:39 AM  Result Value Ref Range   Color, UA yellow     Comment: pale   Clarity, UA clear    Glucose, UA Negative Negative   Bilirubin, UA neg    Ketones, UA neg    Spec Grav, UA 1.015 1.010 - 1.025   Blood, UA neg    pH, UA 6.0 5.0 - 8.0   Protein, UA Negative Negative   Urobilinogen, UA negative (A) 0.2 or 1.0 E.U./dL   Nitrite, UA neg    Leukocytes, UA Negative Negative   Appearance normal    Odor none   CBC with Differential/Platelet     Status: None  Collection Time: 09/01/20 12:36 PM  Result Value Ref Range   WBC 8.5 3.8 - 10.8 Thousand/uL   RBC 4.20 3.80 - 5.10 Million/uL   Hemoglobin 12.9 11.7 - 15.5 g/dL   HCT 37.9 35.0 - 45.0 %   MCV 90.2 80.0 - 100.0 fL   MCH 30.7 27.0 - 33.0 pg   MCHC 34.0 32.0 - 36.0 g/dL   RDW 13.3 11.0 - 15.0 %   Platelets 322 140 - 400 Thousand/uL   MPV 9.7 7.5 - 12.5 fL   Neutro Abs 6,579 1,500 - 7,800 cells/uL   Lymphs Abs  1,012 850 - 3,900 cells/uL   Absolute Monocytes 791 200 - 950 cells/uL   Eosinophils Absolute 51 15 - 500 cells/uL   Basophils Absolute 68 0 - 200 cells/uL   Neutrophils Relative % 77.4 %   Total Lymphocyte 11.9 %   Monocytes Relative 9.3 %   Eosinophils Relative 0.6 %   Basophils Relative 0.8 %  COMPLETE METABOLIC PANEL WITH GFR     Status: Abnormal   Collection Time: 09/01/20 12:36 PM  Result Value Ref Range   Glucose, Bld 79 65 - 99 mg/dL    Comment: .            Fasting reference interval .    BUN 26 (H) 7 - 25 mg/dL   Creat 1.14 (H) 0.60 - 0.93 mg/dL    Comment: For patients >3 years of age, the reference limit for Creatinine is approximately 13% higher for people identified as African-American. .    GFR, Est Non African American 47 (L) > OR = 60 mL/min/1.44m2   GFR, Est African American 54 (L) > OR = 60 mL/min/1.23m2   BUN/Creatinine Ratio 23 (H) 6 - 22 (calc)   Sodium 136 135 - 146 mmol/L   Potassium 4.5 3.5 - 5.3 mmol/L   Chloride 101 98 - 110 mmol/L   CO2 26 20 - 32 mmol/L   Calcium 9.9 8.6 - 10.4 mg/dL   Total Protein 6.6 6.1 - 8.1 g/dL   Albumin 4.5 3.6 - 5.1 g/dL   Globulin 2.1 1.9 - 3.7 g/dL (calc)   AG Ratio 2.1 1.0 - 2.5 (calc)   Total Bilirubin 0.6 0.2 - 1.2 mg/dL   Alkaline phosphatase (APISO) 62 37 - 153 U/L   AST 17 10 - 35 U/L   ALT 20 6 - 29 U/L  SARS CORONAVIRUS 2 (TAT 6-24 HRS) Nasopharyngeal Nasopharyngeal Swab     Status: None   Collection Time: 10/02/20  2:27 PM   Specimen: Nasopharyngeal Swab  Result Value Ref Range   SARS Coronavirus 2 NEGATIVE NEGATIVE    Comment: (NOTE) SARS-CoV-2 target nucleic acids are NOT DETECTED.  The SARS-CoV-2 RNA is generally detectable in upper and lower respiratory specimens during the acute phase of infection. Negative results do not preclude SARS-CoV-2 infection, do not rule out co-infections with other pathogens, and should not be used as the sole basis for treatment or other patient management  decisions. Negative results must be combined with clinical observations, patient history, and epidemiological information. The expected result is Negative.  Fact Sheet for Patients: SugarRoll.be  Fact Sheet for Healthcare Providers: https://www.woods-mathews.com/  This test is not yet approved or cleared by the [place] FDA and  has been authorized for detection and/or diagnosis of SARS-CoV-2 by FDA under an Emergency Use Authorization (EUA). This EUA will remain  in effect (meaning this test can be used) for the duration of the COVID-19 declaration  under Se ction 564(b)(1) of the Act, 21 U.S.C. section 360bbb-3(b)(1), unless the authorization is terminated or revoked sooner.  Performed at [facility], [place] [address], [place], [place] [address]   Surgical pathology     Status: None   Collection Time: 10/06/20 10:02 AM  Result Value Ref Range   SURGICAL PATHOLOGY      SURGICAL PATHOLOGY CASE: [id number] PATIENT: Vermell Kaps Surgical Pathology Report     Specimen Submitted: A. Stomach; cbx  Clinical History: Unexplained weight loss R63.4 dysphagia R13.10. Stricture, gastritis, diverticulosis      DIAGNOSIS: A.  STOMACH; COLD BIOPSY: - ANTRAL MUCOSA WITH REACTIVE AND HEALING EROSIVE GASTRITIS. - INTESTINAL METAPLASIA INVOLVES A MAJORITY OF THE BIOPSY FRAGMENTS. - NEGATIVE FOR H. PYLORI, DYSPLASIA, AND MALIGNANCY.  GROSS DESCRIPTION: A. Labeled: Gastric CBXS, gastritis Received: In formalin Collection time: 10:02 AM on 10/06/2020 Placed into formalin time: 10:02 AM on 10/06/2020 Tissue fragment(s): Multiple Size: Aggregate, 0.7 x 0.5 x 0.1 cm Description: Received are multiple fragments of pink-tan soft tissue. Entirely submitted in cassette 1.   Final Diagnosis performed by Quay Burow, MD.   Electronically signed 10/07/2020 12:50:17PM The electronic signature indicates that the named Attending  Pathologist h as evaluated the specimen Technical component performed at [facility], [address] [place], [place] [address] Lab: [phone] Dir: Rush Farmer, MD, MMM  Professional component performed at [facility], [facility], [place], [place], [place] [address] Lab: [phone] Dir: Dellia Nims. Reuel Derby, MD     Radiology VAS [place] LASER ABLATION OF SUPERFICIAL VEIN  Result Date: 10/30/2020  Lower Venous Study Indications: Post-op.  Performing Technologist: Charlane Ferretti RT (R)(VS)  Examination Guidelines: A complete evaluation includes B-mode imaging, spectral Doppler, color Doppler, and power Doppler as needed of all accessible portions of each vessel. Bilateral testing is considered an integral part of a complete examination. Limited examinations for reoccurring indications may be performed as noted.  +---------+---------------+---------+-----------+----------+--------------+ LEFT     CompressibilityPhasicitySpontaneityPropertiesThrombus Aging +---------+---------------+---------+-----------+----------+--------------+ CFV      Full                                                        +---------+---------------+---------+-----------+----------+--------------+ SFJ      Full                                                        +---------+---------------+---------+-----------+----------+--------------+ FV Prox  Full                                                        +---------+---------------+---------+-----------+----------+--------------+ FV Mid   Full                                                        +---------+---------------+---------+-----------+----------+--------------+ FV DistalFull                                                        +---------+---------------+---------+-----------+----------+--------------+  POP      Full                                                         +---------+---------------+---------+-----------+----------+--------------+ GSV      None                                                        +---------+---------------+---------+-----------+----------+--------------+ SSV      Full                                                        +---------+---------------+---------+-----------+----------+--------------+  Summary: Left: Successful vein closure. GSV is non compressable from calf to greater than 3.43cm from SFJ.  *See table(s) above for measurements and observations. Electronically signed by Leotis Pain MD on 10/30/2020 at 10:09:51 AM.    Final     Assessment/Plan Hypertension blood pressure control important in reducing the progression of atherosclerotic disease.  Stage 3 chronic kidney disease ([place]) Could certainly contribute to LE edema  Lower limb ulcer, calf, left, limited to breakdown of skin ([place]) Ulcer has now healed status post laser ablation of the left great saphenous vein.  Varicose veins of lower extremities with ulcer ([place]) The patient is gone through successful laser ablation of the left great saphenous vein with improvement in the pain and swelling and healing of her wound.  She does have painful residual varicosities and these would benefit from sclerotherapy and foam sclerotherapy to remove.  Risks and benefits of these procedures were discussed with the patient in detail and she is agreeable to proceed    Leotis Pain, MD  11/17/2020 12:20 PM    This note was created with Dragon medical transcription system.  Any errors from dictation are purely unintentional

## 2020-11-17 NOTE — Patient Instructions (Signed)
Sclerotherapy Sclerotherapy is a procedure that is done to improve the appearance of varicose veins and spider veins and to help relieve aching, swelling, cramping, and pain in the legs. Varicose veins are veins that have become enlarged, bulging, and twisted due to a damaged valve that causes blood to collect (pool) in the veins. Spider veins are small varicose veins. Sclerotherapy usually works best for smaller spider and varicose veins. This procedure involves injecting a chemical into the vein to close it off. You may need more than one treatment to close a vein all the way. Sclerotherapy is usually performed on the legs because that is where varicose and spider veins most often occur. Tell a health care provider about:  Any allergies you have.  All medicines you are taking, including vitamins, herbs, eye drops, creams, and over-the-counter medicines.  Any blood disorders you have.  Any surgeries you have had.  Any medical conditions you have.  Whether you are pregnant or may be pregnant. What are the risks? Generally, this is a safe procedure. However, problems may occur, including:  Infection.  Bleeding.  Allergic reactions to medicines or dyes.  Blood clots.  Nerve damage.  Bruising and scarring.  Darkened skin around the area. What happens before the procedure?  Do not use lotions or creams on your legs unless your health care provider approves.  Follow instructions from your health care provider about eating and drinking restrictions.  Do not use any products that contain nicotine or tobacco, such as cigarettes and e-cigarettes. If you need help quitting, ask your health care provider.  Ask your health care provider about: ? Changing or stopping your regular medicines. This is especially important if you are taking diabetes medicines or blood thinners. ? Taking medicines such as aspirin and ibuprofen. These medicines can thin your blood. Do not take these medicines  before your procedure if your health care provider instructs you not to.  You may have an ultrasound of the affected area to check for blood clots and to check blood flow.  In rare cases, you may have an X-ray procedure to check how blood flows through your veins (angiogram). For an angiogram, a dye is injected to outline your veins on X-rays. What happens during the procedure?  To lower your risk of infection: ? Your health care team will wash or sanitize their hands. ? Your skin will be washed with soap. ? Hair may be removed from the treatment area.  A small, thin needle will be used to inject a chemical (sclerosant) into your varicose vein. The sclerosant will irritate the lining of the vein and cause the vein to close below the injection site. You may feel some stinging, burning, or irritation.  The injection may be repeated for more than one varicose vein.  The injection area will be wrapped with elastic bandages. The procedure may vary among health care providers and hospitals.   What happens after the procedure?  Your injection area will be wrapped with elastic bandages. If there is bleeding, the bandages may be changed.  Do not drive until your health care provider approves. You may need to wait 1-2 days before driving.  You will need to wear compression stockings for about a week, or as long as your health care provider recommends. Summary  Sclerotherapy is a procedure that is done to improve the appearance of varicose veins and spider veins and to help relieve aching, swelling, cramping, and pain in the legs.  A small, thin needle   is used to inject a chemical (sclerosant) into a spider vein or varicose vein to close it off.  Elastic bandages will be wrapped around the injection area after the procedure. This information is not intended to replace advice given to you by your health care provider. Make sure you discuss any questions you have with your health care  provider. Document Revised: 11/25/2019 Document Reviewed: 11/25/2019 Elsevier Patient Education  2021 Elsevier Inc.  

## 2020-11-17 NOTE — Assessment & Plan Note (Signed)
The patient is gone through successful laser ablation of the left great saphenous vein with improvement in the pain and swelling and healing of her wound.  She does have painful residual varicosities and these would benefit from sclerotherapy and foam sclerotherapy to remove.  Risks and benefits of these procedures were discussed with the patient in detail and she is agreeable to proceed

## 2020-11-24 ENCOUNTER — Other Ambulatory Visit: Payer: Self-pay | Admitting: *Deleted

## 2020-11-24 MED ORDER — AZITHROMYCIN 250 MG PO TABS: ORAL_TABLET | ORAL | 0 refills | Status: AC

## 2020-11-25 ENCOUNTER — Other Ambulatory Visit: Payer: Self-pay | Admitting: Internal Medicine

## 2020-11-25 DIAGNOSIS — I872 Venous insufficiency (chronic) (peripheral): Secondary | ICD-10-CM

## 2020-12-01 DIAGNOSIS — J3089 Other allergic rhinitis: Secondary | ICD-10-CM | POA: Diagnosis not present

## 2020-12-01 DIAGNOSIS — J301 Allergic rhinitis due to pollen: Secondary | ICD-10-CM | POA: Diagnosis not present

## 2020-12-08 DIAGNOSIS — J3089 Other allergic rhinitis: Secondary | ICD-10-CM | POA: Diagnosis not present

## 2020-12-08 DIAGNOSIS — J301 Allergic rhinitis due to pollen: Secondary | ICD-10-CM | POA: Diagnosis not present

## 2020-12-09 ENCOUNTER — Other Ambulatory Visit: Payer: Self-pay

## 2020-12-09 ENCOUNTER — Encounter (INDEPENDENT_AMBULATORY_CARE_PROVIDER_SITE_OTHER): Payer: Self-pay | Admitting: Vascular Surgery

## 2020-12-09 ENCOUNTER — Ambulatory Visit (INDEPENDENT_AMBULATORY_CARE_PROVIDER_SITE_OTHER): Payer: Medicare Other | Admitting: Vascular Surgery

## 2020-12-09 VITALS — BP 135/85 | HR 73 | Resp 16 | Wt 175.0 lb

## 2020-12-09 DIAGNOSIS — I872 Venous insufficiency (chronic) (peripheral): Secondary | ICD-10-CM

## 2020-12-09 DIAGNOSIS — I83028 Varicose veins of left lower extremity with ulcer other part of lower leg: Secondary | ICD-10-CM

## 2020-12-09 DIAGNOSIS — L97822 Non-pressure chronic ulcer of other part of left lower leg with fat layer exposed: Secondary | ICD-10-CM

## 2020-12-09 NOTE — Progress Notes (Signed)
Varicose veins of bilateral  lower extremity with inflammation (454.1  I83.10) Current Plans   Indication: Patient presents with symptomatic varicose veins of the bilateral  lower extremity.   Procedure: Sclerotherapy using hypertonic saline mixed with 1% Lidocaine was performed on the bilateral lower extremity. Compression wraps were placed. The patient tolerated the procedure well. 

## 2020-12-15 ENCOUNTER — Other Ambulatory Visit: Payer: Self-pay | Admitting: Internal Medicine

## 2020-12-15 ENCOUNTER — Ambulatory Visit (INDEPENDENT_AMBULATORY_CARE_PROVIDER_SITE_OTHER): Payer: Medicare Other | Admitting: Internal Medicine

## 2020-12-15 ENCOUNTER — Other Ambulatory Visit: Payer: Self-pay

## 2020-12-15 ENCOUNTER — Encounter: Payer: Self-pay | Admitting: Internal Medicine

## 2020-12-15 VITALS — BP 125/77 | HR 92 | Ht 62.0 in | Wt 175.1 lb

## 2020-12-15 DIAGNOSIS — D0502 Lobular carcinoma in situ of left breast: Secondary | ICD-10-CM | POA: Diagnosis not present

## 2020-12-15 DIAGNOSIS — J4540 Moderate persistent asthma, uncomplicated: Secondary | ICD-10-CM

## 2020-12-15 DIAGNOSIS — J3089 Other allergic rhinitis: Secondary | ICD-10-CM | POA: Diagnosis not present

## 2020-12-15 DIAGNOSIS — R1319 Other dysphagia: Secondary | ICD-10-CM | POA: Diagnosis not present

## 2020-12-15 DIAGNOSIS — G4700 Insomnia, unspecified: Secondary | ICD-10-CM

## 2020-12-15 DIAGNOSIS — I872 Venous insufficiency (chronic) (peripheral): Secondary | ICD-10-CM | POA: Diagnosis not present

## 2020-12-15 DIAGNOSIS — I10 Essential (primary) hypertension: Secondary | ICD-10-CM | POA: Diagnosis not present

## 2020-12-15 DIAGNOSIS — N1832 Chronic kidney disease, stage 3b: Secondary | ICD-10-CM

## 2020-12-15 DIAGNOSIS — J301 Allergic rhinitis due to pollen: Secondary | ICD-10-CM

## 2020-12-15 MED ORDER — POTASSIUM CHLORIDE CRYS ER 20 MEQ PO TBCR: 20.0000 meq | EXTENDED_RELEASE_TABLET | Freq: Every day | ORAL | 1 refills | Status: DC

## 2020-12-15 MED ORDER — FUROSEMIDE 20 MG PO TABS: 20.0000 mg | ORAL_TABLET | Freq: Two times a day (BID) | ORAL | 3 refills | Status: DC

## 2020-12-15 MED ORDER — PREDNISONE 2.5 MG PO TABS: 2.5000 mg | ORAL_TABLET | Freq: Every day | ORAL | 6 refills | Status: DC

## 2020-12-15 MED ORDER — ZOLPIDEM TARTRATE 10 MG PO TABS: 10.0000 mg | ORAL_TABLET | Freq: Every day | ORAL | 1 refills | Status: DC

## 2020-12-15 NOTE — Progress Notes (Signed)
Established Patient Office Visit  Subjective:  Patient ID: [name], female    DOB: Feb 01, 1946  Age: 75 y.o. MRN: [id number]  CC:  Chief Complaint  Patient presents with  . Hypertension  . Insomnia    Patient in need of refill of ambien     HPI  [name] presents for  bp check up  Past Medical History:  Diagnosis Date  . Asthma    WELL CONTROLLED  . Bulging lumbar disc   . Cancer [facility]) 1999   thyroid with recurrent 2006  . Complication of anesthesia    HEART STOPPED DURING TONSILLECTOMY (AGE 30) DUE TO ALLERGY TO ETHER  . DDD (degenerative disc disease), lumbar   . DDD (degenerative disc disease), thoracolumbar   . Diverticulosis   . Family history of adverse reaction to anesthesia    PTS FATHER-UNSURE WHAT HAPPENED  . Fibromyalgia   . GERD (gastroesophageal reflux disease)   . Heart murmur   . Hepatitis    AGE 49 "VIRAL"  . Hypertension    OFF MEDS CURRENTLY PER PCP (Imane Burrough)  . Hypothyroidism   . Interstitial cystitis   . Kidney disease   . PONV (postoperative nausea and vomiting)   . Thyroid disease     Past Surgical History:  Procedure Laterality Date  . BLADDER SURGERY  1984  . BREAST SURGERY  [id number]   mass removed  . BREAST SURGERY  April 2017   Dr Jack Quarto [facility]  . CATARACT EXTRACTION  2011  . CHOLECYSTECTOMY N/A 11/30/2015   Procedure: LAPAROSCOPIC CHOLECYSTECTOMY WITH INTRAOPERATIVE CHOLANGIOGRAM;  Surgeon: Christene Lye, MD;  Location: [facility] ORS;  Service: General;  Laterality: N/A;  . COLONOSCOPY  2005, 2015  . COLONOSCOPY WITH PROPOFOL N/A 10/06/2020   Procedure: COLONOSCOPY WITH PROPOFOL;  Surgeon: Lucilla Lame, MD;  Location: [facility] ENDOSCOPY;  Service: Endoscopy;  Laterality: N/A;  . DILATION AND CURETTAGE OF UTERUS  1975  . ESOPHAGOGASTRODUODENOSCOPY (EGD) WITH PROPOFOL N/A 10/06/2020   Procedure: ESOPHAGOGASTRODUODENOSCOPY (EGD) WITH PROPOFOL;  Surgeon: Lucilla Lame, MD;  Location: [facility] ENDOSCOPY;  Service: Endoscopy;   Laterality: N/A;  . EXCISIONAL HEMORRHOIDECTOMY  1975  . EYE SURGERY Left 2011  . [place]  . HERNIA REPAIR  2015  . THYROIDECTOMY  1999  . TONSILLECTOMY  1952   AGE 30  . TONSILLECTOMY      Family History  Problem Relation Age of Onset  . Heart disease Mother   . Cancer Father        colon  . Heart disease Brother   . Diabetes Maternal Aunt   . Asthma Paternal Aunt   . Bladder Cancer Maternal Aunt   . Ovarian cancer Neg Hx   . Kidney cancer Neg Hx   . Prostate cancer Neg Hx     Social History   Socioeconomic History  . Marital status: Married    Spouse name: Not on file  . Number of children: Not on file  . Years of education: Not on file  . Highest education level: Not on file  Occupational History  . Not on file  Tobacco Use  . Smoking status: Former Smoker    Packs/day: 1.00    Years: 15.00    Pack years: 15.00    Quit date: 11/25/1985    Years since quitting: 35.0  . Smokeless tobacco: Never Used  Vaping Use  . Vaping Use: Never used  Substance and Sexual Activity  . Alcohol use: No  .  Drug use: No  . Sexual activity: Not Currently  Other Topics Concern  . Not on file  Social History Narrative  . Not on file   Social Determinants of Health   Financial Resource Strain: Not on file  Food Insecurity: Not on file  Transportation Needs: Not on file  Physical Activity: Not on file  Stress: Not on file  Social Connections: Not on file  Intimate Partner Violence: Not on file     Current Outpatient Medications:  .  albuterol (PROVENTIL) (2.5 MG/3ML) 0.083% nebulizer solution, Inhale into the lungs., Disp: , Rfl:  .  ALBUTEROL IN, Inhale into the lungs as needed., Disp: , Rfl:  .  ALPRAZolam (XANAX) 0.5 MG tablet, SMARTSIG:1 Tablet(s) By Mouth, Disp: , Rfl:  .  amitriptyline (ELAVIL) 25 MG tablet, Take by mouth., Disp: , Rfl:  .  aspirin 81 MG tablet, Take 81 mg by mouth daily., Disp: , Rfl:  .  Calcium 500 MG CHEW, Chew by mouth.,  Disp: , Rfl:  .  cannabidiol (EPIDIOLEX) 100 MG/ML solution, Take by mouth., Disp: , Rfl:  .  cetirizine (ZYRTEC) 10 MG tablet, Take 10 mg by mouth daily., Disp: , Rfl:  .  Cholecalciferol (VITAMIN D-3) 1000 units CAPS, Take by mouth daily., Disp: , Rfl:  .  cyclobenzaprine (FLEXERIL) 5 MG tablet, 1/2-1 po bid prn, Disp: , Rfl:  .  EPINEPHrine 0.3 mg/0.3 mL IJ SOAJ injection, See admin instructions., Disp: , Rfl:  .  FLUoxetine (PROZAC) 40 MG capsule, TAKE 1 CAPSULE BY MOUTH EVERY MORNING, Disp: 90 capsule, Rfl: 0 .  fluticasone (FLONASE) 50 MCG/ACT nasal spray, Place 1 spray into both nostrils daily., Disp: , Rfl:  .  Fluticasone-Salmeterol (ADVAIR) 250-50 MCG/DOSE AEPB, Inhale 1 puff into the lungs 3 (three) times daily as needed. , Disp: , Rfl:  .  gentamicin cream (GARAMYCIN) 0.1 %, SMARTSIG:Sparingly Topical 3 Times a Week, Disp: , Rfl:  .  guaiFENesin (MUCINEX) 600 MG 12 hr tablet, Take by mouth 2 (two) times daily., Disp: , Rfl:  .  HYDROcodone-acetaminophen (NORCO) 7.5-325 MG tablet, Take by mouth., Disp: , Rfl:  .  hydrOXYzine (ATARAX/VISTARIL) 25 MG tablet, 1 tablet, Disp: , Rfl:  .  levothyroxine (SYNTHROID) 150 MCG tablet, TAKE 1 TABLET BY MOUTH DAILY, Disp: 90 tablet, Rfl: 3 .  olmesartan (BENICAR) 20 MG tablet, Take 20 mg by mouth as needed (IF BP IS ELEVATED)., Disp: , Rfl:  .  Omega-3 Fatty Acids (FISH OIL) 1000 MG CAPS, Take by mouth., Disp: , Rfl:  .  pantoprazole (PROTONIX) 40 MG tablet, Take 1 tablet (40 mg total) by mouth daily., Disp: 90 tablet, Rfl: 3 .  polyethylene glycol-electrolytes (NULYTELY) 420 g solution, Drink one 8 oz glass every 20 mins until entire container is finished starting at 5:00pm on 10/05/20, Disp: 4000 mL, Rfl: 0 .  pregabalin (LYRICA) 50 MG capsule, Take by mouth., Disp: , Rfl:  .  PRESCRIPTION MEDICATION, Allergy injections once weekly, Disp: , Rfl:  .  tiZANidine (ZANAFLEX) 4 MG capsule, Take 4 mg by mouth as needed., Disp: , Rfl:  .  vitamin C  (ASCORBIC ACID) 500 MG tablet, Take by mouth., Disp: , Rfl:  .  furosemide (LASIX) 20 MG tablet, Take 1 tablet (20 mg total) by mouth 2 (two) times daily., Disp: 180 tablet, Rfl: 3 .  potassium chloride SA (KLOR-CON) 20 MEQ tablet, Take 1 tablet (20 mEq total) by mouth daily., Disp: 90 tablet, Rfl: 1 .  predniSONE (DELTASONE)  2.5 MG tablet, Take 1 tablet (2.5 mg total) by mouth daily., Disp: 30 tablet, Rfl: 6 .  zolpidem (AMBIEN) 10 MG tablet, Take 1 tablet (10 mg total) by mouth at bedtime., Disp: 90 tablet, Rfl: 1   Allergies  Allergen Reactions  . Pollen Extract Other (See Comments)  . Erythromycin Other (See Comments)  . Ether     "HEART STOPPED WHILE IN SURGERY"  . Gramineae Pollens Other (See Comments)  . Montelukast Sodium Other (See Comments)  . Penicillins Hives  . Tamsulosin     Other reaction(s): Other (See Comments) "made me loopy"  . Band-Aid Plus Antibiotic [Bacitracin-Polymyxin B] Rash    ROS Review of Systems  Constitutional: Negative.   HENT: Negative.   Eyes: Negative.   Respiratory: Negative.   Cardiovascular: Negative.   Gastrointestinal: Negative.   Endocrine: Negative.   Genitourinary: Negative.   Musculoskeletal: Negative.   Skin: Negative.   Allergic/Immunologic: Negative.   Neurological: Negative.   Hematological: Negative.   Psychiatric/Behavioral: Negative.   All other systems reviewed and are negative.     Objective:    Physical Exam Vitals reviewed.  Constitutional:      Appearance: Normal appearance.  HENT:     Mouth/Throat:     Mouth: Mucous membranes are moist.  Eyes:     Pupils: Pupils are equal, round, and reactive to light.  Neck:     Vascular: No carotid bruit.  Cardiovascular:     Rate and Rhythm: Normal rate and regular rhythm.     Pulses: Normal pulses.     Heart sounds: Normal heart sounds.  Pulmonary:     Effort: Pulmonary effort is normal.     Breath sounds: Normal breath sounds.  Abdominal:     General: Bowel  sounds are normal.     Palpations: Abdomen is soft. There is no hepatomegaly, splenomegaly or mass.     Tenderness: There is no abdominal tenderness.     Hernia: No hernia is present.  Musculoskeletal:        General: No tenderness.     Cervical back: Neck supple.     Right lower leg: No edema.     Left lower leg: No edema.  Skin:    Findings: No rash.  Neurological:     Mental Status: She is alert and oriented to person, place, and time.     Motor: No weakness.  Psychiatric:        Mood and Affect: Mood and affect normal.        Behavior: Behavior normal.     BP 125/77   Pulse 92   Ht 5\' 2"  (1.575 m)   Wt 175 lb 1.6 oz (79.4 kg)   BMI 32.03 kg/m  Wt Readings from Last 3 Encounters:  12/15/20 175 lb 1.6 oz (79.4 kg)  12/09/20 175 lb (79.4 kg)  11/17/20 175 lb (79.4 kg)     Health Maintenance Due  Topic Date Due  . Hepatitis C Screening  Never done  . TETANUS/TDAP  Never done  . DEXA SCAN  Never done  . COVID-19 Vaccine (3 - Pfizer risk 4-dose series) 09/03/2020    There are no preventive care reminders to display for this patient.  Lab Results  Component Value Date   TSH 1.37 06/09/2020   Lab Results  Component Value Date   WBC 8.5 09/01/2020   HGB 12.9 09/01/2020   HCT 37.9 09/01/2020   MCV 90.2 09/01/2020   PLT 322 09/01/2020  Lab Results  Component Value Date   NA 136 09/01/2020   K 4.5 09/01/2020   CO2 26 09/01/2020   GLUCOSE 79 09/01/2020   BUN 26 (H) 09/01/2020   CREATININE 1.14 (H) 09/01/2020   BILITOT 0.6 09/01/2020   ALKPHOS 66 07/08/2014   AST 17 09/01/2020   ALT 20 09/01/2020   PROT 6.6 09/01/2020   ALBUMIN 4.1 07/08/2014   CALCIUM 9.9 09/01/2020   ANIONGAP 9 03/06/2017   Lab Results  Component Value Date   CHOL 193 06/09/2020   Lab Results  Component Value Date   HDL 89 06/09/2020   Lab Results  Component Value Date   LDLCALC 86 06/09/2020   Lab Results  Component Value Date   TRIG 90 06/09/2020   Lab Results   Component Value Date   CHOLHDL 2.2 06/09/2020   No results found for: HGBA1C    Assessment & Plan:   Problem List Items Addressed This Visit      Cardiovascular and Mediastinum   Essential hypertension - Primary    Blood pressure is elevated on today patient was advised to follow DASH Diet:    The DASH diet is an eating plan that can help lower your blood pressure. DASH stands for Dietary Approaches to Stop Hypertension. Hypertension is high blood pressure. The DASH diet focuses on eating foods that are high in calcium, potassium, and magnesium. These nutrients can lower blood pressure. The foods that are highest in these nutrients are fruits, vegetables, low-fat dairy products, nuts, seeds, and legumes. But taking calcium, potassium, and magnesium supplements instead of eating foods that are high in those nutrients does not have the same effect. The DASH diet also includes whole grains, fish, and poultry. The DASH diet is one of several lifestyle changes your doctor may recommend to lower your high blood pressure. Your doctor may also want you to decrease the amount of sodium in your diet. Lowering sodium while following the DASH diet can lower blood pressure even further than just the DASH diet alone. Follow-up care is a key part of your treatment and safety. Be sure to make and go to all appointments, and call your doctor if you are having problems. It's also a good idea to know your test results and keep a list of the medicines you take. How can you care for yourself at home? Following the DASH diet  Eat 4 to 5 servings of fruit each day. A serving is 1 medium-sized piece of fruit,  cup chopped or canned fruit, 1/4 cup dried fruit, or 4 ounces ( cup) of fruit juice. Choose fruit more often than fruit juice.  Eat 4 to 5 servings of vegetables each day. A serving is 1 cup of lettuce or raw leafy vegetables,  cup of chopped or cooked vegetables, or 4 ounces ( cup) of vegetable juice.  Choose vegetables more often than vegetable juice.  Get 2 to 3 servings of low-fat and fat-free dairy each day. A serving is 8 ounces of milk, 1 cup of yogurt, or 1  ounces of cheese.  Eat 6 to 8 servings of grains each day. A serving is 1 slice of bread, 1 ounce of dry cereal, or  cup of cooked rice, pasta, or cooked cereal. Try to choose whole-grain products as much as possible.  Limit lean meat, poultry, and fish to 2 servings each day. A serving is 3 ounces, about the size of a deck of cards.  Eat 4 to 5 servings of  nuts, seeds, and legumes (cooked dried beans, lentils, and split peas) each week. A serving is 1/3 cup of nuts, 2 tablespoons of seeds, or  cup of cooked beans or peas.  Limit fats and oils to 2 to 3 servings each day. A serving is 1 teaspoon of vegetable oil or 2 tablespoons of salad dressing.  Limit sweets and added sugars to 5 servings or less a week. A serving is 1 tablespoon jelly or jam,  cup sorbet, or 1 cup of lemonade.  Eat less than 2,300 milligrams (mg) of sodium a day. If you limit your sodium to 1,500 mg a day, you can lower your blood pressure even more. Tips for success  Start small. Do not try to make dramatic changes to your diet all at once. You might feel that you are missing out on your favorite foods and then be more likely to not follow the plan. Make small changes, and stick with them. Once those changes become habit, add a few more changes.  Try some of the following: ? Make it a goal to eat a fruit or vegetable at every meal and at snacks. This will make it easy to get the recommended amount of fruits and vegetables each day. ? Try yogurt topped with fruit and nuts for a snack or healthy dessert. ? Add lettuce, tomato, cucumber, and onion to sandwiches. ? Combine a ready-made pizza crust with low-fat mozzarella cheese and lots of vegetable toppings. Try using tomatoes, squash, spinach, broccoli, carrots, cauliflower, and onions. ? Have a variety of  cut-up vegetables with a low-fat dip as an appetizer instead of chips and dip. ? Sprinkle sunflower seeds or chopped almonds over salads. Or try adding chopped walnuts or almonds to cooked vegetables. ? Try some vegetarian meals using beans and peas. Add garbanzo or kidney beans to salads. Make burritos and tacos with mashed pinto beans or black beans.      Relevant Medications   furosemide (LASIX) 20 MG tablet   Chronic venous insufficiency    Stable overall.      Relevant Medications   furosemide (LASIX) 20 MG tablet     Respiratory   Seasonal allergic rhinitis due to pollen    Take Claritin as needed 10 mg p.o. daily      Moderate persistent asthma, uncomplicated    Asthma is under control.      Relevant Medications   predniSONE (DELTASONE) 2.5 MG tablet     Digestive   Dysphagia    Patient has Barrett's esophagus.  We will have follow-up endoscopy..        Genitourinary   Stage 3 chronic kidney disease ([place])     Other   Neoplasm of breast, primary tumor staging category Tis: lobular carcinoma in situ (LCIS)    Patient has stopped her hormonal therapy after 5 years.  She is doing good      Relevant Medications   predniSONE (DELTASONE) 2.5 MG tablet    Other Visit Diagnoses    Insomnia, unspecified type       Relevant Medications   zolpidem (AMBIEN) 10 MG tablet      Meds ordered this encounter  Medications  . zolpidem (AMBIEN) 10 MG tablet    Sig: Take 1 tablet (10 mg total) by mouth at bedtime.    Dispense:  90 tablet    Refill:  1  . furosemide (LASIX) 20 MG tablet    Sig: Take 1 tablet (20 mg total) by mouth 2 (two)  times daily.    Dispense:  180 tablet    Refill:  3    PT STATES SHE SHOULD BE TAKING 2/DAY. PLEASE SEND NEW RX OR CALL PT. THANK YOU  . predniSONE (DELTASONE) 2.5 MG tablet    Sig: Take 1 tablet (2.5 mg total) by mouth daily.    Dispense:  30 tablet    Refill:  6    PT NEEDS REFILL FOR NEXT TIME.  Marland Kitchen potassium chloride SA (KLOR-CON)  20 MEQ tablet    Sig: Take 1 tablet (20 mEq total) by mouth daily.    Dispense:  90 tablet    Refill:  1    FOR NEXT FILL. THANK YOU    Follow-up: No follow-ups on file.    [name], MD

## 2020-12-15 NOTE — Assessment & Plan Note (Signed)
Patient has stopped her hormonal therapy after 5 years.  She is doing good

## 2020-12-15 NOTE — Assessment & Plan Note (Signed)
Patient has Barrett's esophagus.  We will have follow-up endoscopy.Marland Kitchen

## 2020-12-15 NOTE — Assessment & Plan Note (Signed)
Take Claritin as needed 10 mg p.o. daily

## 2020-12-15 NOTE — Assessment & Plan Note (Signed)
Blood pressure is elevated on today patient was advised to follow DASH Diet:    The DASH diet is an eating plan that can help lower your blood pressure. DASH stands for Dietary Approaches to Stop Hypertension. Hypertension is high blood pressure. The DASH diet focuses on eating foods that are high in calcium, potassium, and magnesium. These nutrients can lower blood pressure. The foods that are highest in these nutrients are fruits, vegetables, low-fat dairy products, nuts, seeds, and legumes. But taking calcium, potassium, and magnesium supplements instead of eating foods that are high in those nutrients does not have the same effect. The DASH diet also includes whole grains, fish, and poultry. The DASH diet is one of several lifestyle changes your doctor may recommend to lower your high blood pressure. Your doctor may also want you to decrease the amount of sodium in your diet. Lowering sodium while following the DASH diet can lower blood pressure even further than just the DASH diet alone. Follow-up care is a key part of your treatment and safety. Be sure to make and go to all appointments, and call your doctor if you are having problems. It's also a good idea to know your test results and keep a list of the medicines you take. How can you care for yourself at home? Following the DASH diet  Eat 4 to 5 servings of fruit each day. A serving is 1 medium-sized piece of fruit,  cup chopped or canned fruit, 1/4 cup dried fruit, or 4 ounces ( cup) of fruit juice. Choose fruit more often than fruit juice.  Eat 4 to 5 servings of vegetables each day. A serving is 1 cup of lettuce or raw leafy vegetables,  cup of chopped or cooked vegetables, or 4 ounces ( cup) of vegetable juice. Choose vegetables more often than vegetable juice.  Get 2 to 3 servings of low-fat and fat-free dairy each day. A serving is 8 ounces of milk, 1 cup of yogurt, or 1  ounces of cheese.  Eat 6 to 8 servings of grains each day. A  serving is 1 slice of bread, 1 ounce of dry cereal, or  cup of cooked rice, pasta, or cooked cereal. Try to choose whole-grain products as much as possible.  Limit lean meat, poultry, and fish to 2 servings each day. A serving is 3 ounces, about the size of a deck of cards.  Eat 4 to 5 servings of nuts, seeds, and legumes (cooked dried beans, lentils, and split peas) each week. A serving is 1/3 cup of nuts, 2 tablespoons of seeds, or  cup of cooked beans or peas.  Limit fats and oils to 2 to 3 servings each day. A serving is 1 teaspoon of vegetable oil or 2 tablespoons of salad dressing.  Limit sweets and added sugars to 5 servings or less a week. A serving is 1 tablespoon jelly or jam,  cup sorbet, or 1 cup of lemonade.  Eat less than 2,300 milligrams (mg) of sodium a day. If you limit your sodium to 1,500 mg a day, you can lower your blood pressure even more. Tips for success  Start small. Do not try to make dramatic changes to your diet all at once. You might feel that you are missing out on your favorite foods and then be more likely to not follow the plan. Make small changes, and stick with them. Once those changes become habit, add a few more changes.  Try some of the following: ? Make  it a goal to eat a fruit or vegetable at every meal and at snacks. This will make it easy to get the recommended amount of fruits and vegetables each day. ? Try yogurt topped with fruit and nuts for a snack or healthy dessert. ? Add lettuce, tomato, cucumber, and onion to sandwiches. ? Combine a ready-made pizza crust with low-fat mozzarella cheese and lots of vegetable toppings. Try using tomatoes, squash, spinach, broccoli, carrots, cauliflower, and onions. ? Have a variety of cut-up vegetables with a low-fat dip as an appetizer instead of chips and dip. ? Sprinkle sunflower seeds or chopped almonds over salads. Or try adding chopped walnuts or almonds to cooked vegetables. ? Try some vegetarian meals  using beans and peas. Add garbanzo or kidney beans to salads. Make burritos and tacos with mashed pinto beans or black beans.

## 2020-12-15 NOTE — Assessment & Plan Note (Signed)
Stable overall.  

## 2020-12-15 NOTE — Assessment & Plan Note (Signed)
Asthma is under control 

## 2020-12-22 ENCOUNTER — Ambulatory Visit (INDEPENDENT_AMBULATORY_CARE_PROVIDER_SITE_OTHER): Payer: Medicare Other | Admitting: Vascular Surgery

## 2020-12-28 DIAGNOSIS — M5416 Radiculopathy, lumbar region: Secondary | ICD-10-CM | POA: Diagnosis not present

## 2020-12-28 DIAGNOSIS — M48062 Spinal stenosis, lumbar region with neurogenic claudication: Secondary | ICD-10-CM | POA: Diagnosis not present

## 2020-12-31 DIAGNOSIS — J301 Allergic rhinitis due to pollen: Secondary | ICD-10-CM | POA: Diagnosis not present

## 2020-12-31 DIAGNOSIS — J3089 Other allergic rhinitis: Secondary | ICD-10-CM | POA: Diagnosis not present

## 2021-01-07 DIAGNOSIS — U071 COVID-19: Secondary | ICD-10-CM | POA: Diagnosis not present

## 2021-01-07 DIAGNOSIS — J45909 Unspecified asthma, uncomplicated: Secondary | ICD-10-CM | POA: Diagnosis not present

## 2021-01-13 ENCOUNTER — Ambulatory Visit (INDEPENDENT_AMBULATORY_CARE_PROVIDER_SITE_OTHER): Payer: Medicare Other | Admitting: Vascular Surgery

## 2021-01-21 DIAGNOSIS — M2042 Other hammer toe(s) (acquired), left foot: Secondary | ICD-10-CM | POA: Diagnosis not present

## 2021-01-21 DIAGNOSIS — J3089 Other allergic rhinitis: Secondary | ICD-10-CM | POA: Diagnosis not present

## 2021-01-21 DIAGNOSIS — L409 Psoriasis, unspecified: Secondary | ICD-10-CM | POA: Diagnosis not present

## 2021-01-21 DIAGNOSIS — M797 Fibromyalgia: Secondary | ICD-10-CM | POA: Diagnosis not present

## 2021-01-21 DIAGNOSIS — M2041 Other hammer toe(s) (acquired), right foot: Secondary | ICD-10-CM | POA: Diagnosis not present

## 2021-01-21 DIAGNOSIS — M5136 Other intervertebral disc degeneration, lumbar region: Secondary | ICD-10-CM | POA: Diagnosis not present

## 2021-01-21 DIAGNOSIS — J301 Allergic rhinitis due to pollen: Secondary | ICD-10-CM | POA: Diagnosis not present

## 2021-01-28 DIAGNOSIS — J301 Allergic rhinitis due to pollen: Secondary | ICD-10-CM | POA: Diagnosis not present

## 2021-01-28 DIAGNOSIS — J3089 Other allergic rhinitis: Secondary | ICD-10-CM | POA: Diagnosis not present

## 2021-02-04 DIAGNOSIS — M5136 Other intervertebral disc degeneration, lumbar region: Secondary | ICD-10-CM | POA: Diagnosis not present

## 2021-02-04 DIAGNOSIS — M5416 Radiculopathy, lumbar region: Secondary | ICD-10-CM | POA: Diagnosis not present

## 2021-02-04 DIAGNOSIS — J3089 Other allergic rhinitis: Secondary | ICD-10-CM | POA: Diagnosis not present

## 2021-02-04 DIAGNOSIS — J301 Allergic rhinitis due to pollen: Secondary | ICD-10-CM | POA: Diagnosis not present

## 2021-02-04 DIAGNOSIS — M5414 Radiculopathy, thoracic region: Secondary | ICD-10-CM | POA: Diagnosis not present

## 2021-02-04 DIAGNOSIS — M48062 Spinal stenosis, lumbar region with neurogenic claudication: Secondary | ICD-10-CM | POA: Diagnosis not present

## 2021-02-09 DIAGNOSIS — J3089 Other allergic rhinitis: Secondary | ICD-10-CM | POA: Diagnosis not present

## 2021-02-09 DIAGNOSIS — J301 Allergic rhinitis due to pollen: Secondary | ICD-10-CM | POA: Diagnosis not present

## 2021-02-10 ENCOUNTER — Ambulatory Visit (INDEPENDENT_AMBULATORY_CARE_PROVIDER_SITE_OTHER): Payer: Medicare Other | Admitting: Vascular Surgery

## 2021-02-10 ENCOUNTER — Encounter (INDEPENDENT_AMBULATORY_CARE_PROVIDER_SITE_OTHER): Payer: Self-pay | Admitting: Vascular Surgery

## 2021-02-10 ENCOUNTER — Other Ambulatory Visit: Payer: Self-pay

## 2021-02-10 VITALS — BP 135/80 | HR 80 | Resp 16 | Wt 177.0 lb

## 2021-02-10 DIAGNOSIS — I872 Venous insufficiency (chronic) (peripheral): Secondary | ICD-10-CM

## 2021-02-10 NOTE — Progress Notes (Signed)
Varicose veins of bilateral  lower extremity with inflammation (454.1  I83.10) Current Plans   Indication: Patient presents with symptomatic varicose veins of the bilateral  lower extremity.   Procedure: Sclerotherapy using hypertonic saline mixed with 1% Lidocaine was performed on the bilateral lower extremity. Compression wraps were placed. The patient tolerated the procedure well. 

## 2021-02-18 DIAGNOSIS — J301 Allergic rhinitis due to pollen: Secondary | ICD-10-CM | POA: Diagnosis not present

## 2021-02-18 DIAGNOSIS — J3089 Other allergic rhinitis: Secondary | ICD-10-CM | POA: Diagnosis not present

## 2021-02-25 DIAGNOSIS — J3081 Allergic rhinitis due to animal (cat) (dog) hair and dander: Secondary | ICD-10-CM | POA: Diagnosis not present

## 2021-02-25 DIAGNOSIS — J301 Allergic rhinitis due to pollen: Secondary | ICD-10-CM | POA: Diagnosis not present

## 2021-02-25 DIAGNOSIS — J3089 Other allergic rhinitis: Secondary | ICD-10-CM | POA: Diagnosis not present

## 2021-03-05 DIAGNOSIS — J3089 Other allergic rhinitis: Secondary | ICD-10-CM | POA: Diagnosis not present

## 2021-03-05 DIAGNOSIS — J301 Allergic rhinitis due to pollen: Secondary | ICD-10-CM | POA: Diagnosis not present

## 2021-03-10 ENCOUNTER — Ambulatory Visit (INDEPENDENT_AMBULATORY_CARE_PROVIDER_SITE_OTHER): Payer: Medicare Other | Admitting: Vascular Surgery

## 2021-03-11 DIAGNOSIS — J3089 Other allergic rhinitis: Secondary | ICD-10-CM | POA: Diagnosis not present

## 2021-03-11 DIAGNOSIS — J301 Allergic rhinitis due to pollen: Secondary | ICD-10-CM | POA: Diagnosis not present

## 2021-03-16 ENCOUNTER — Ambulatory Visit: Payer: Medicare Other | Admitting: Internal Medicine

## 2021-03-24 ENCOUNTER — Other Ambulatory Visit: Payer: Self-pay | Admitting: Internal Medicine

## 2021-03-24 ENCOUNTER — Other Ambulatory Visit: Payer: Self-pay

## 2021-03-24 ENCOUNTER — Encounter (INDEPENDENT_AMBULATORY_CARE_PROVIDER_SITE_OTHER): Payer: Self-pay | Admitting: Vascular Surgery

## 2021-03-24 ENCOUNTER — Ambulatory Visit (INDEPENDENT_AMBULATORY_CARE_PROVIDER_SITE_OTHER): Payer: Medicare Other | Admitting: Vascular Surgery

## 2021-03-24 VITALS — BP 135/79 | HR 92 | Resp 16 | Wt 178.0 lb

## 2021-03-24 DIAGNOSIS — I872 Venous insufficiency (chronic) (peripheral): Secondary | ICD-10-CM

## 2021-03-24 DIAGNOSIS — I83028 Varicose veins of left lower extremity with ulcer other part of lower leg: Secondary | ICD-10-CM | POA: Diagnosis not present

## 2021-03-24 DIAGNOSIS — L97822 Non-pressure chronic ulcer of other part of left lower leg with fat layer exposed: Secondary | ICD-10-CM

## 2021-03-24 DIAGNOSIS — G4700 Insomnia, unspecified: Secondary | ICD-10-CM

## 2021-03-24 NOTE — Progress Notes (Signed)
Varicose veins of bilateral  lower extremity with inflammation (454.1  I83.10) Current Plans   Indication: Patient presents with symptomatic varicose veins of the bilateral  lower extremity.   Procedure: Sclerotherapy using hypertonic saline mixed with 1% Lidocaine was performed on the bilateral lower extremity. Compression wraps were placed. The patient tolerated the procedure well. 

## 2021-03-25 DIAGNOSIS — J301 Allergic rhinitis due to pollen: Secondary | ICD-10-CM | POA: Diagnosis not present

## 2021-03-25 DIAGNOSIS — J3089 Other allergic rhinitis: Secondary | ICD-10-CM | POA: Diagnosis not present

## 2021-03-30 ENCOUNTER — Other Ambulatory Visit: Payer: Self-pay

## 2021-03-30 ENCOUNTER — Ambulatory Visit (INDEPENDENT_AMBULATORY_CARE_PROVIDER_SITE_OTHER): Payer: Medicare Other | Admitting: Internal Medicine

## 2021-03-30 ENCOUNTER — Encounter: Payer: Self-pay | Admitting: Internal Medicine

## 2021-03-30 VITALS — BP 155/87 | HR 84 | Ht 62.0 in | Wt 176.0 lb

## 2021-03-30 DIAGNOSIS — N950 Postmenopausal bleeding: Secondary | ICD-10-CM

## 2021-03-30 DIAGNOSIS — Z0000 Encounter for general adult medical examination without abnormal findings: Secondary | ICD-10-CM

## 2021-03-30 DIAGNOSIS — I10 Essential (primary) hypertension: Secondary | ICD-10-CM

## 2021-03-30 DIAGNOSIS — M5116 Intervertebral disc disorders with radiculopathy, lumbar region: Secondary | ICD-10-CM | POA: Diagnosis not present

## 2021-03-30 DIAGNOSIS — J301 Allergic rhinitis due to pollen: Secondary | ICD-10-CM | POA: Diagnosis not present

## 2021-03-30 DIAGNOSIS — J3089 Other allergic rhinitis: Secondary | ICD-10-CM | POA: Diagnosis not present

## 2021-03-30 DIAGNOSIS — N1832 Chronic kidney disease, stage 3b: Secondary | ICD-10-CM

## 2021-03-30 DIAGNOSIS — J4540 Moderate persistent asthma, uncomplicated: Secondary | ICD-10-CM | POA: Diagnosis not present

## 2021-03-30 NOTE — Assessment & Plan Note (Signed)
Resolved patient is seeing OB/GYN

## 2021-03-30 NOTE — Assessment & Plan Note (Signed)

## 2021-03-30 NOTE — Progress Notes (Signed)
Established Patient Office Visit  Subjective:  Patient ID: [name], female    DOB: 07/06/1946  Age: 75 y.o. MRN: [id number]  CC:  Chief Complaint  Patient presents with   Annual Exam    HPI  [name] presents for check up Past Medical History:  Diagnosis Date   Asthma    WELL CONTROLLED   Bulging lumbar disc    Cancer ([place]) 1999   thyroid with recurrent 5284   Complication of anesthesia    HEART STOPPED DURING TONSILLECTOMY (AGE 45) DUE TO ALLERGY TO ETHER   DDD (degenerative disc disease), lumbar    DDD (degenerative disc disease), thoracolumbar    Diverticulosis    Family history of adverse reaction to anesthesia    PTS FATHER-UNSURE WHAT HAPPENED   Fibromyalgia    GERD (gastroesophageal reflux disease)    Heart murmur    Hepatitis    AGE 75 "VIRAL"   Hypertension    OFF MEDS CURRENTLY PER PCP (Jose Alleyne)   Hypothyroidism    Interstitial cystitis    Kidney disease    PONV (postoperative nausea and vomiting)    Thyroid disease     Past Surgical History:  Procedure Laterality Date   [place]  1324,4010   mass removed   BREAST SURGERY  April 2017   Dr Jack Quarto [facility]   CATARACT EXTRACTION  2011   CHOLECYSTECTOMY N/A 11/30/2015   Procedure: LAPAROSCOPIC CHOLECYSTECTOMY WITH INTRAOPERATIVE CHOLANGIOGRAM;  Surgeon: Christene Lye, MD;  Location: [facility] ORS;  Service: General;  Laterality: N/A;   COLONOSCOPY  2005, 2015   COLONOSCOPY WITH PROPOFOL N/A 10/06/2020   Procedure: COLONOSCOPY WITH PROPOFOL;  Surgeon: Lucilla Lame, MD;  Location: [facility] ENDOSCOPY;  Service: Endoscopy;  Laterality: N/A;   DILATION AND CURETTAGE OF UTERUS  1975   ESOPHAGOGASTRODUODENOSCOPY (EGD) WITH PROPOFOL N/A 10/06/2020   Procedure: ESOPHAGOGASTRODUODENOSCOPY (EGD) WITH PROPOFOL;  Surgeon: Lucilla Lame, MD;  Location: [facility] ENDOSCOPY;  Service: Endoscopy;  Laterality: N/A;   EXCISIONAL HEMORRHOIDECTOMY  1975   EYE SURGERY Left 2011    [place]   HERNIA REPAIR  2015   THYROIDECTOMY  1999   TONSILLECTOMY  1952   AGE 45   TONSILLECTOMY      Family History  Problem Relation Age of Onset   Heart disease Mother    Cancer Father        colon   Heart disease Brother    Diabetes Maternal Aunt    Asthma Paternal Aunt    Bladder Cancer Maternal Aunt    Ovarian cancer Neg Hx    Kidney cancer Neg Hx    Prostate cancer Neg Hx     Social History   Socioeconomic History   Marital status: Married    Spouse name: Not on file   Number of children: Not on file   Years of education: Not on file   Highest education level: Not on file  Occupational History   Not on file  Tobacco Use   Smoking status: Former    Packs/day: 1.00    Years: 15.00    Pack years: 15.00    Types: Cigarettes    Quit date: 11/25/1985    Years since quitting: 35.3   Smokeless tobacco: Never  Vaping Use   Vaping Use: Never used  Substance and Sexual Activity   Alcohol use: No   Drug use: No   Sexual activity: Not Currently  Other Topics Concern  Not on file  Social History Narrative   Not on file   Social Determinants of Health   Financial Resource Strain: Not on file  Food Insecurity: Not on file  Transportation Needs: Not on file  Physical Activity: Not on file  Stress: Not on file  Social Connections: Not on file  Intimate Partner Violence: Not on file     Current Outpatient Medications:    albuterol (PROVENTIL) (2.5 MG/3ML) 0.083% nebulizer solution, Inhale into the lungs., Disp: , Rfl:    ALBUTEROL IN, Inhale into the lungs as needed., Disp: , Rfl:    ALPRAZolam (XANAX) 0.5 MG tablet, SMARTSIG:1 Tablet(s) By Mouth, Disp: , Rfl:    amitriptyline (ELAVIL) 25 MG tablet, Take by mouth., Disp: , Rfl:    aspirin 81 MG tablet, Take 81 mg by mouth daily., Disp: , Rfl:    Calcium 500 MG CHEW, Chew by mouth., Disp: , Rfl:    cannabidiol (EPIDIOLEX) 100 MG/ML solution, Take by mouth., Disp: , Rfl:    cetirizine  (ZYRTEC) 10 MG tablet, Take 10 mg by mouth daily., Disp: , Rfl:    Cholecalciferol (VITAMIN D-3) 1000 units CAPS, Take by mouth daily., Disp: , Rfl:    cyclobenzaprine (FLEXERIL) 5 MG tablet, 1/2-1 po bid prn, Disp: , Rfl:    EPINEPHrine 0.3 mg/0.3 mL IJ SOAJ injection, See admin instructions., Disp: , Rfl:    FLUoxetine (PROZAC) 40 MG capsule, TAKE 1 CAPSULE BY MOUTH EVERY MORNING, Disp: 90 capsule, Rfl: 0   fluticasone (FLONASE) 50 MCG/ACT nasal spray, Place 1 spray into both nostrils daily., Disp: , Rfl:    Fluticasone-Salmeterol (ADVAIR) 250-50 MCG/DOSE AEPB, Inhale 1 puff into the lungs 3 (three) times daily as needed. , Disp: , Rfl:    furosemide (LASIX) 20 MG tablet, Take 1 tablet (20 mg total) by mouth 2 (two) times daily., Disp: 180 tablet, Rfl: 3   gentamicin cream (GARAMYCIN) 0.1 %, SMARTSIG:Sparingly Topical 3 Times a Week, Disp: , Rfl:    guaiFENesin (MUCINEX) 600 MG 12 hr tablet, Take by mouth 2 (two) times daily., Disp: , Rfl:    HYDROcodone-acetaminophen (NORCO) 7.5-325 MG tablet, Take by mouth., Disp: , Rfl:    hydrOXYzine (ATARAX/VISTARIL) 25 MG tablet, 1 tablet, Disp: , Rfl:    levothyroxine (SYNTHROID) 150 MCG tablet, TAKE 1 TABLET BY MOUTH DAILY, Disp: 90 tablet, Rfl: 3   olmesartan (BENICAR) 20 MG tablet, Take 20 mg by mouth as needed (IF BP IS ELEVATED)., Disp: , Rfl:    Omega-3 Fatty Acids (FISH OIL) 1000 MG CAPS, Take by mouth., Disp: , Rfl:    pantoprazole (PROTONIX) 40 MG tablet, Take 1 tablet (40 mg total) by mouth daily., Disp: 90 tablet, Rfl: 3   polyethylene glycol-electrolytes (NULYTELY) 420 g solution, Drink one 8 oz glass every 20 mins until entire container is finished starting at 5:00pm on 10/05/20, Disp: 4000 mL, Rfl: 0   potassium chloride SA (KLOR-CON) 20 MEQ tablet, Take 1 tablet (20 mEq total) by mouth daily., Disp: 90 tablet, Rfl: 1   predniSONE (DELTASONE) 2.5 MG tablet, Take 1 tablet (2.5 mg total) by mouth daily., Disp: 30 tablet, Rfl: 6   pregabalin  (LYRICA) 50 MG capsule, Take by mouth., Disp: , Rfl:    PRESCRIPTION MEDICATION, Allergy injections once weekly, Disp: , Rfl:    tiZANidine (ZANAFLEX) 4 MG capsule, Take 4 mg by mouth as needed., Disp: , Rfl:    vitamin C (ASCORBIC ACID) 500 MG tablet, Take by mouth., Disp: ,  Rfl:    zolpidem (AMBIEN) 10 MG tablet, TAKE 1 TABLET BY MOUTH AT BEDTIME, Disp: 30 tablet, Rfl: 2   Allergies  Allergen Reactions   Pollen Extract Other (See Comments)   Erythromycin Other (See Comments)   Ether     "HEART STOPPED WHILE IN SURGERY"   Gramineae Pollens Other (See Comments)   Montelukast Sodium Other (See Comments)   Penicillins Hives   Tamsulosin     Other reaction(s): Other (See Comments) "made me loopy"   Band-Aid Plus Antibiotic [Bacitracin-Polymyxin B] Rash    ROS Review of Systems  Constitutional: Negative.   HENT: Negative.    Eyes: Negative.   Respiratory: Negative.    Cardiovascular: Negative.   Gastrointestinal: Negative.   Endocrine: Negative.   Genitourinary: Negative.   Musculoskeletal: Negative.   Skin: Negative.   Allergic/Immunologic: Negative.   Neurological: Negative.   Hematological: Negative.   Psychiatric/Behavioral: Negative.    All other systems reviewed and are negative.    Objective:    Physical Exam Vitals reviewed.  Constitutional:      Appearance: Normal appearance.  HENT:     Mouth/Throat:     Mouth: Mucous membranes are moist.  Eyes:     Pupils: Pupils are equal, round, and reactive to light.  Neck:     Vascular: No carotid bruit.  Cardiovascular:     Rate and Rhythm: Normal rate and regular rhythm.     Pulses: Normal pulses.     Heart sounds: Normal heart sounds.  Pulmonary:     Effort: Pulmonary effort is normal.     Breath sounds: Normal breath sounds.  Abdominal:     General: Bowel sounds are normal.     Palpations: Abdomen is soft. There is no hepatomegaly, splenomegaly or mass.     Tenderness: There is no abdominal tenderness.      Hernia: No hernia is present.  Musculoskeletal:        General: No tenderness.     Cervical back: Neck supple.     Right lower leg: No edema.     Left lower leg: No edema.  Skin:    Findings: No rash.  Neurological:     Mental Status: She is alert and oriented to person, place, and time.     Motor: No weakness.  Psychiatric:        Mood and Affect: Mood and affect normal.        Behavior: Behavior normal.    BP (!) 155/87   Pulse 84   Ht 5\' 2"  (1.575 m)   Wt 176 lb (79.8 kg)   BMI 32.19 kg/m  Wt Readings from Last 3 Encounters:  03/30/21 176 lb (79.8 kg)  03/24/21 178 lb (80.7 kg)  02/10/21 177 lb (80.3 kg)     Health Maintenance Due  Topic Date Due   Hepatitis C Screening  Never done   TETANUS/TDAP  Never done   Zoster Vaccines- Shingrix (1 of 2) Never done   DEXA SCAN  Never done   PNA vac Low Risk Adult (1 of 2 - PCV13) Never done   COVID-19 Vaccine (3 - Pfizer risk series) 08/22/2020   INFLUENZA VACCINE  02/22/2021    There are no preventive care reminders to display for this patient.  Lab Results  Component Value Date   TSH 1.37 06/09/2020   Lab Results  Component Value Date   WBC 8.5 09/01/2020   HGB 12.9 09/01/2020   HCT 37.9 09/01/2020   MCV  90.2 09/01/2020   PLT 322 09/01/2020   Lab Results  Component Value Date   NA 136 09/01/2020   K 4.5 09/01/2020   CO2 26 09/01/2020   GLUCOSE 79 09/01/2020   BUN 26 (H) 09/01/2020   CREATININE 1.14 (H) 09/01/2020   BILITOT 0.6 09/01/2020   ALKPHOS 66 07/08/2014   AST 17 09/01/2020   ALT 20 09/01/2020   PROT 6.6 09/01/2020   ALBUMIN 4.1 07/08/2014   CALCIUM 9.9 09/01/2020   ANIONGAP 9 03/06/2017   Lab Results  Component Value Date   CHOL 193 06/09/2020   Lab Results  Component Value Date   HDL 89 06/09/2020   Lab Results  Component Value Date   LDLCALC 86 06/09/2020   Lab Results  Component Value Date   TRIG 90 06/09/2020   Lab Results  Component Value Date   CHOLHDL 2.2 06/09/2020    No results found for: HGBA1C    Assessment & Plan:   Problem List Items Addressed This Visit       Cardiovascular and Mediastinum   Essential hypertension - Primary     Patient denies any chest pain or shortness of breath there is no history of palpitation or paroxysmal nocturnal dyspnea   patient was advised to follow low-salt low-cholesterol diet    ideally I want to keep systolic blood pressure below 130 mmHg, patient was asked to check blood pressure one times a week and give me a report on that.  Patient will be follow-up in 3 months  or earlier as needed, patient will call me back for any change in the cardiovascular symptoms Patient was advised to buy a book from local bookstore concerning blood pressure and read several chapters  every day.  This will be supplemented by some of the material we will give him from the office.  Patient should also utilize other resources like YouTube and Internet to learn more about the blood pressure and the diet.        Respiratory   Moderate persistent asthma, uncomplicated    Stable at the present time no wheezing was noted no sinus problem        Nervous and Auditory   Neuritis or radiculitis due to rupture of lumbar intervertebral disc    Patient is under care of rheumatologist        Genitourinary   Stage 3 chronic kidney disease [facility])    Nephrology        Other   PMB (postmenopausal bleeding)    Resolved patient is seeing OB/GYN      Other Visit Diagnoses     Encounter for preventive care           No orders of the defined types were placed in this encounter.   Follow-up: No follow-ups on file.    [name], MD

## 2021-03-30 NOTE — Assessment & Plan Note (Signed)
Patient is under care of rheumatologist

## 2021-03-30 NOTE — Assessment & Plan Note (Signed)
Nephrology

## 2021-03-30 NOTE — Assessment & Plan Note (Signed)
Stable at the present time no wheezing was noted no sinus problem

## 2021-04-06 DIAGNOSIS — J3081 Allergic rhinitis due to animal (cat) (dog) hair and dander: Secondary | ICD-10-CM | POA: Diagnosis not present

## 2021-04-06 DIAGNOSIS — J301 Allergic rhinitis due to pollen: Secondary | ICD-10-CM | POA: Diagnosis not present

## 2021-04-06 DIAGNOSIS — J3089 Other allergic rhinitis: Secondary | ICD-10-CM | POA: Diagnosis not present

## 2021-04-07 ENCOUNTER — Ambulatory Visit (INDEPENDENT_AMBULATORY_CARE_PROVIDER_SITE_OTHER): Payer: Medicare Other | Admitting: Vascular Surgery

## 2021-04-13 DIAGNOSIS — J3089 Other allergic rhinitis: Secondary | ICD-10-CM | POA: Diagnosis not present

## 2021-04-13 DIAGNOSIS — E039 Hypothyroidism, unspecified: Secondary | ICD-10-CM | POA: Diagnosis not present

## 2021-04-13 DIAGNOSIS — R8290 Unspecified abnormal findings in urine: Secondary | ICD-10-CM | POA: Diagnosis not present

## 2021-04-13 DIAGNOSIS — J301 Allergic rhinitis due to pollen: Secondary | ICD-10-CM | POA: Diagnosis not present

## 2021-04-13 DIAGNOSIS — J3081 Allergic rhinitis due to animal (cat) (dog) hair and dander: Secondary | ICD-10-CM | POA: Diagnosis not present

## 2021-04-13 DIAGNOSIS — N1831 Chronic kidney disease, stage 3a: Secondary | ICD-10-CM | POA: Diagnosis not present

## 2021-04-14 ENCOUNTER — Ambulatory Visit (INDEPENDENT_AMBULATORY_CARE_PROVIDER_SITE_OTHER): Payer: Medicare Other | Admitting: Vascular Surgery

## 2021-04-19 DIAGNOSIS — I10 Essential (primary) hypertension: Secondary | ICD-10-CM | POA: Diagnosis not present

## 2021-04-19 DIAGNOSIS — N1831 Chronic kidney disease, stage 3a: Secondary | ICD-10-CM | POA: Diagnosis not present

## 2021-04-19 DIAGNOSIS — R600 Localized edema: Secondary | ICD-10-CM | POA: Diagnosis not present

## 2021-04-21 ENCOUNTER — Ambulatory Visit (INDEPENDENT_AMBULATORY_CARE_PROVIDER_SITE_OTHER): Payer: Medicare Other | Admitting: Vascular Surgery

## 2021-04-22 DIAGNOSIS — J3089 Other allergic rhinitis: Secondary | ICD-10-CM | POA: Diagnosis not present

## 2021-04-22 DIAGNOSIS — J301 Allergic rhinitis due to pollen: Secondary | ICD-10-CM | POA: Diagnosis not present

## 2021-04-27 DIAGNOSIS — J3089 Other allergic rhinitis: Secondary | ICD-10-CM | POA: Diagnosis not present

## 2021-04-27 DIAGNOSIS — J301 Allergic rhinitis due to pollen: Secondary | ICD-10-CM | POA: Diagnosis not present

## 2021-04-27 DIAGNOSIS — R8271 Bacteriuria: Secondary | ICD-10-CM | POA: Diagnosis not present

## 2021-05-10 DIAGNOSIS — I10 Essential (primary) hypertension: Secondary | ICD-10-CM | POA: Diagnosis not present

## 2021-05-10 DIAGNOSIS — N1832 Chronic kidney disease, stage 3b: Secondary | ICD-10-CM | POA: Diagnosis not present

## 2021-05-10 DIAGNOSIS — R82998 Other abnormal findings in urine: Secondary | ICD-10-CM | POA: Diagnosis not present

## 2021-05-10 DIAGNOSIS — R600 Localized edema: Secondary | ICD-10-CM | POA: Diagnosis not present

## 2021-05-10 DIAGNOSIS — N1831 Chronic kidney disease, stage 3a: Secondary | ICD-10-CM | POA: Diagnosis not present

## 2021-05-11 DIAGNOSIS — J3089 Other allergic rhinitis: Secondary | ICD-10-CM | POA: Diagnosis not present

## 2021-05-11 DIAGNOSIS — J301 Allergic rhinitis due to pollen: Secondary | ICD-10-CM | POA: Diagnosis not present

## 2021-05-13 DIAGNOSIS — M48062 Spinal stenosis, lumbar region with neurogenic claudication: Secondary | ICD-10-CM | POA: Diagnosis not present

## 2021-05-13 DIAGNOSIS — M5416 Radiculopathy, lumbar region: Secondary | ICD-10-CM | POA: Diagnosis not present

## 2021-05-13 DIAGNOSIS — Z79899 Other long term (current) drug therapy: Secondary | ICD-10-CM | POA: Diagnosis not present

## 2021-05-13 DIAGNOSIS — M5136 Other intervertebral disc degeneration, lumbar region: Secondary | ICD-10-CM | POA: Diagnosis not present

## 2021-05-18 ENCOUNTER — Other Ambulatory Visit: Payer: Self-pay | Admitting: Internal Medicine

## 2021-05-20 DIAGNOSIS — J3089 Other allergic rhinitis: Secondary | ICD-10-CM | POA: Diagnosis not present

## 2021-05-20 DIAGNOSIS — Z23 Encounter for immunization: Secondary | ICD-10-CM | POA: Diagnosis not present

## 2021-05-20 DIAGNOSIS — J301 Allergic rhinitis due to pollen: Secondary | ICD-10-CM | POA: Diagnosis not present

## 2021-05-25 DIAGNOSIS — J3089 Other allergic rhinitis: Secondary | ICD-10-CM | POA: Diagnosis not present

## 2021-05-25 DIAGNOSIS — L409 Psoriasis, unspecified: Secondary | ICD-10-CM | POA: Diagnosis not present

## 2021-05-25 DIAGNOSIS — M5136 Other intervertebral disc degeneration, lumbar region: Secondary | ICD-10-CM | POA: Diagnosis not present

## 2021-05-25 DIAGNOSIS — M797 Fibromyalgia: Secondary | ICD-10-CM | POA: Diagnosis not present

## 2021-05-25 DIAGNOSIS — J301 Allergic rhinitis due to pollen: Secondary | ICD-10-CM | POA: Diagnosis not present

## 2021-06-08 DIAGNOSIS — J301 Allergic rhinitis due to pollen: Secondary | ICD-10-CM | POA: Diagnosis not present

## 2021-06-08 DIAGNOSIS — J3089 Other allergic rhinitis: Secondary | ICD-10-CM | POA: Diagnosis not present

## 2021-06-14 DIAGNOSIS — Z6832 Body mass index (BMI) 32.0-32.9, adult: Secondary | ICD-10-CM | POA: Diagnosis not present

## 2021-06-14 DIAGNOSIS — Z1231 Encounter for screening mammogram for malignant neoplasm of breast: Secondary | ICD-10-CM | POA: Diagnosis not present

## 2021-06-14 DIAGNOSIS — D0500 Lobular carcinoma in situ of unspecified breast: Secondary | ICD-10-CM | POA: Diagnosis not present

## 2021-06-15 ENCOUNTER — Encounter: Payer: Self-pay | Admitting: Internal Medicine

## 2021-06-15 ENCOUNTER — Ambulatory Visit (INDEPENDENT_AMBULATORY_CARE_PROVIDER_SITE_OTHER): Payer: Medicare Other | Admitting: Internal Medicine

## 2021-06-15 ENCOUNTER — Other Ambulatory Visit: Payer: Self-pay

## 2021-06-15 VITALS — BP 131/85 | HR 86 | Ht 62.0 in | Wt 171.0 lb

## 2021-06-15 DIAGNOSIS — L97822 Non-pressure chronic ulcer of other part of left lower leg with fat layer exposed: Secondary | ICD-10-CM

## 2021-06-15 DIAGNOSIS — I10 Essential (primary) hypertension: Secondary | ICD-10-CM | POA: Diagnosis not present

## 2021-06-15 DIAGNOSIS — J4540 Moderate persistent asthma, uncomplicated: Secondary | ICD-10-CM | POA: Diagnosis not present

## 2021-06-15 DIAGNOSIS — N184 Chronic kidney disease, stage 4 (severe): Secondary | ICD-10-CM

## 2021-06-15 DIAGNOSIS — J3089 Other allergic rhinitis: Secondary | ICD-10-CM | POA: Diagnosis not present

## 2021-06-15 DIAGNOSIS — I83028 Varicose veins of left lower extremity with ulcer other part of lower leg: Secondary | ICD-10-CM

## 2021-06-15 DIAGNOSIS — G4700 Insomnia, unspecified: Secondary | ICD-10-CM | POA: Diagnosis not present

## 2021-06-15 DIAGNOSIS — J301 Allergic rhinitis due to pollen: Secondary | ICD-10-CM | POA: Diagnosis not present

## 2021-06-15 MED ORDER — ZOLPIDEM TARTRATE 10 MG PO TABS: 10.0000 mg | ORAL_TABLET | Freq: Every day | ORAL | 2 refills | Status: DC

## 2021-06-15 NOTE — Assessment & Plan Note (Signed)
Stable at the present time. 

## 2021-06-15 NOTE — Assessment & Plan Note (Signed)

## 2021-06-15 NOTE — Assessment & Plan Note (Signed)
Refer to nephrologist 

## 2021-06-15 NOTE — Progress Notes (Signed)
Established Patient Office Visit  Subjective:  Patient ID: [name], female    DOB: Jun 01, 1946  Age: 75 y.o. MRN: [id number]  CC:  Chief Complaint  Patient presents with   Follow-up    HPI  [name] presents for for general checkup.  She denies any chest pain or shortness of breath. Anxiety  is better on med s  Past Medical History:  Diagnosis Date   Asthma    WELL CONTROLLED   Bulging lumbar disc    Cancer ([place]) 1999   thyroid with recurrent 7782   Complication of anesthesia    HEART STOPPED DURING TONSILLECTOMY (AGE 89) DUE TO ALLERGY TO ETHER   DDD (degenerative disc disease), lumbar    DDD (degenerative disc disease), thoracolumbar    Diverticulosis    Family history of adverse reaction to anesthesia    PTS FATHER-UNSURE WHAT HAPPENED   Fibromyalgia    GERD (gastroesophageal reflux disease)    Heart murmur    Hepatitis    AGE 32 "VIRAL"   Hypertension    OFF MEDS CURRENTLY PER PCP (Sebastien Jackson)   Hypothyroidism    Interstitial cystitis    Kidney disease    PONV (postoperative nausea and vomiting)    Thyroid disease     Past Surgical History:  Procedure Laterality Date   BLADDER SURGERY  1984   BREAST SURGERY  [id number]   mass removed   BREAST SURGERY  April 2017   Dr Jack Quarto [facility]   CATARACT EXTRACTION  2011   CHOLECYSTECTOMY N/A 11/30/2015   Procedure: LAPAROSCOPIC CHOLECYSTECTOMY WITH INTRAOPERATIVE CHOLANGIOGRAM;  Surgeon: Christene Lye, MD;  Location: [facility] ORS;  Service: General;  Laterality: N/A;   COLONOSCOPY  2005, 2015   COLONOSCOPY WITH PROPOFOL N/A 10/06/2020   Procedure: COLONOSCOPY WITH PROPOFOL;  Surgeon: Lucilla Lame, MD;  Location: [facility];  Service: Endoscopy;  Laterality: N/A;   DILATION AND CURETTAGE OF UTERUS  1975   ESOPHAGOGASTRODUODENOSCOPY (EGD) WITH PROPOFOL N/A 10/06/2020   Procedure: ESOPHAGOGASTRODUODENOSCOPY (EGD) WITH PROPOFOL;  Surgeon: Lucilla Lame, MD;  Location: [facility];  Service:  Endoscopy;  Laterality: N/A;   EXCISIONAL HEMORRHOIDECTOMY  1975   EYE SURGERY Left 2011   [facility]   HERNIA REPAIR  2015   THYROIDECTOMY  1999   TONSILLECTOMY  1952   AGE 89   TONSILLECTOMY      Family History  Problem Relation Age of Onset   Heart disease Mother    Cancer Father        colon   Heart disease Brother    Diabetes Maternal Aunt    Asthma Paternal Aunt    Bladder Cancer Maternal Aunt    Ovarian cancer Neg Hx    Kidney cancer Neg Hx    Prostate cancer Neg Hx     Social History   Socioeconomic History   Marital status: Married    Spouse name: Not on file   Number of children: Not on file   Years of education: Not on file   Highest education level: Not on file  Occupational History   Not on file  Tobacco Use   Smoking status: Former    Packs/day: 1.00    Years: 15.00    Pack years: 15.00    Types: Cigarettes    Quit date: 11/25/1985    Years since quitting: 35.5   Smokeless tobacco: Never  Vaping Use   Vaping Use: Never used  Substance and Sexual Activity  Alcohol use: No   Drug use: No   Sexual activity: Not Currently  Other Topics Concern   Not on file  Social History Narrative   Not on file   Social Determinants of Health   Financial Resource Strain: Not on file  Food Insecurity: Not on file  Transportation Needs: Not on file  Physical Activity: Not on file  Stress: Not on file  Social Connections: Not on file  Intimate Partner Violence: Not on file     Current Outpatient Medications:    albuterol (PROVENTIL) (2.5 MG/3ML) 0.083% nebulizer solution, Inhale into the lungs., Disp: , Rfl:    ALBUTEROL IN, Inhale into the lungs as needed., Disp: , Rfl:    ALPRAZolam (XANAX) 0.5 MG tablet, SMARTSIG:1 Tablet(s) By Mouth, Disp: , Rfl:    amitriptyline (ELAVIL) 25 MG tablet, Take by mouth., Disp: , Rfl:    aspirin 81 MG tablet, Take 81 mg by mouth daily., Disp: , Rfl:    Calcium 500 MG CHEW, Chew by mouth., Disp: , Rfl:     cannabidiol (EPIDIOLEX) 100 MG/ML solution, Take by mouth., Disp: , Rfl:    cetirizine (ZYRTEC) 10 MG tablet, Take 10 mg by mouth daily., Disp: , Rfl:    Cholecalciferol (VITAMIN D-3) 1000 units CAPS, Take by mouth daily., Disp: , Rfl:    cyclobenzaprine (FLEXERIL) 5 MG tablet, 1/2-1 po bid prn, Disp: , Rfl:    EPINEPHrine 0.3 mg/0.3 mL IJ SOAJ injection, See admin instructions., Disp: , Rfl:    FLUoxetine (PROZAC) 40 MG capsule, TAKE 1 CAPSULE BY MOUTH EACH MORNING, Disp: 90 capsule, Rfl: 0   fluticasone (FLONASE) 50 MCG/ACT nasal spray, Place 1 spray into both nostrils daily., Disp: , Rfl:    Fluticasone-Salmeterol (ADVAIR) 250-50 MCG/DOSE AEPB, Inhale 1 puff into the lungs 3 (three) times daily as needed. , Disp: , Rfl:    furosemide (LASIX) 20 MG tablet, Take 1 tablet (20 mg total) by mouth 2 (two) times daily., Disp: 180 tablet, Rfl: 3   gentamicin cream (GARAMYCIN) 0.1 %, SMARTSIG:Sparingly Topical 3 Times a Week, Disp: , Rfl:    guaiFENesin (MUCINEX) 600 MG 12 hr tablet, Take by mouth 2 (two) times daily., Disp: , Rfl:    HYDROcodone-acetaminophen (NORCO) 7.5-325 MG tablet, Take by mouth., Disp: , Rfl:    hydrOXYzine (ATARAX/VISTARIL) 25 MG tablet, 1 tablet, Disp: , Rfl:    levothyroxine (SYNTHROID) 150 MCG tablet, TAKE 1 TABLET BY MOUTH DAILY, Disp: 90 tablet, Rfl: 3   olmesartan (BENICAR) 20 MG tablet, Take 20 mg by mouth as needed (IF BP IS ELEVATED)., Disp: , Rfl:    Omega-3 Fatty Acids (FISH OIL) 1000 MG CAPS, Take by mouth., Disp: , Rfl:    pantoprazole (PROTONIX) 40 MG tablet, Take 1 tablet (40 mg total) by mouth daily., Disp: 90 tablet, Rfl: 3   polyethylene glycol-electrolytes (NULYTELY) 420 g solution, Drink one 8 oz glass every 20 mins until entire container is finished starting at 5:00pm on 10/05/20, Disp: 4000 mL, Rfl: 0   potassium chloride SA (KLOR-CON) 20 MEQ tablet, Take 1 tablet (20 mEq total) by mouth daily., Disp: 90 tablet, Rfl: 1   predniSONE (DELTASONE) 2.5 MG tablet,  Take 1 tablet (2.5 mg total) by mouth daily., Disp: 30 tablet, Rfl: 6   pregabalin (LYRICA) 50 MG capsule, Take by mouth., Disp: , Rfl:    PRESCRIPTION MEDICATION, Allergy injections once weekly, Disp: , Rfl:    tiZANidine (ZANAFLEX) 4 MG capsule, Take 4 mg by mouth  as needed., Disp: , Rfl:    vitamin C (ASCORBIC ACID) 500 MG tablet, Take by mouth., Disp: , Rfl:    zolpidem (AMBIEN) 10 MG tablet, Take 1 tablet (10 mg total) by mouth at bedtime., Disp: 30 tablet, Rfl: 2   Allergies  Allergen Reactions   Pollen Extract Other (See Comments)   Erythromycin Other (See Comments)   Ether     "HEART STOPPED WHILE IN SURGERY"   Gramineae Pollens Other (See Comments)   Montelukast Sodium Other (See Comments)   Penicillins Hives   Tamsulosin     Other reaction(s): Other (See Comments) "made me loopy"   Band-Aid Plus Antibiotic [Bacitracin-Polymyxin B] Rash    ROS Review of Systems  Constitutional: Negative.   HENT: Negative.    Eyes: Negative.   Respiratory: Negative.    Cardiovascular: Negative.   Gastrointestinal: Negative.   Endocrine: Negative.   Genitourinary: Negative.   Musculoskeletal: Negative.   Skin: Negative.   Allergic/Immunologic: Negative.   Neurological: Negative.  Negative for facial asymmetry, speech difficulty, light-headedness and headaches.  Hematological: Negative.   Psychiatric/Behavioral: Negative.  Negative for agitation, behavioral problems, confusion and dysphoric mood.   All other systems reviewed and are negative.    Objective:    Physical Exam Vitals reviewed.  Constitutional:      Appearance: Normal appearance.  HENT:     Mouth/Throat:     Mouth: Mucous membranes are moist.  Eyes:     Pupils: Pupils are equal, round, and reactive to light.  Neck:     Vascular: No carotid bruit.  Cardiovascular:     Rate and Rhythm: Normal rate and regular rhythm.     Pulses: Normal pulses.     Heart sounds: Normal heart sounds.  Pulmonary:     Effort:  Pulmonary effort is normal.     Breath sounds: Normal breath sounds.  Abdominal:     General: Bowel sounds are normal.     Palpations: Abdomen is soft. There is no hepatomegaly, splenomegaly or mass.     Tenderness: There is no abdominal tenderness.     Hernia: No hernia is present.  Musculoskeletal:        General: No tenderness.     Cervical back: Neck supple.     Right lower leg: No edema.     Left lower leg: No edema.  Skin:    Findings: No rash.  Neurological:     Mental Status: She is alert and oriented to person, place, and time.     Motor: No weakness.  Psychiatric:        Mood and Affect: Mood and affect normal.        Behavior: Behavior normal.    BP 131/85   Pulse 86   Ht 5\' 2"  (1.575 m)   Wt 171 lb (77.6 kg)   BMI 31.28 kg/m  Wt Readings from Last 3 Encounters:  06/15/21 171 lb (77.6 kg)  03/30/21 176 lb (79.8 kg)  03/24/21 178 lb (80.7 kg)     Health Maintenance Due  Topic Date Due   Hepatitis C Screening  Never done   TETANUS/TDAP  Never done   DEXA SCAN  Never done   COVID-19 Vaccine (3 - Pfizer risk series) 09/03/2020   Pneumonia Vaccine 31+ Years old (3 - PCV) 08/06/2021    There are no preventive care reminders to display for this patient.  Lab Results  Component Value Date   TSH 1.37 06/09/2020   Lab Results  Component Value Date   WBC 8.5 09/01/2020   HGB 12.9 09/01/2020   HCT 37.9 09/01/2020   MCV 90.2 09/01/2020   PLT 322 09/01/2020   Lab Results  Component Value Date   NA 136 09/01/2020   K 4.5 09/01/2020   CO2 26 09/01/2020   GLUCOSE 79 09/01/2020   BUN 26 (H) 09/01/2020   CREATININE 1.14 (H) 09/01/2020   BILITOT 0.6 09/01/2020   ALKPHOS 66 07/08/2014   AST 17 09/01/2020   ALT 20 09/01/2020   PROT 6.6 09/01/2020   ALBUMIN 4.1 07/08/2014   CALCIUM 9.9 09/01/2020   ANIONGAP 9 03/06/2017   Lab Results  Component Value Date   CHOL 193 06/09/2020   Lab Results  Component Value Date   HDL 89 06/09/2020   Lab Results   Component Value Date   LDLCALC 86 06/09/2020   Lab Results  Component Value Date   TRIG 90 06/09/2020   Lab Results  Component Value Date   CHOLHDL 2.2 06/09/2020   No results found for: HGBA1C    Assessment & Plan:   Problem List Items Addressed This Visit       Cardiovascular and Mediastinum   Essential hypertension     Patient denies any chest pain or shortness of breath there is no history of palpitation or paroxysmal nocturnal dyspnea   patient was advised to follow low-salt low-cholesterol diet    ideally I want to keep systolic blood pressure below 130 mmHg, patient was asked to check blood pressure one times a week and give me a report on that.  Patient will be follow-up in 3 months  or earlier as needed, patient will call me back for any change in the cardiovascular symptoms Patient was advised to buy a book from local bookstore concerning blood pressure and read several chapters  every day.  This will be supplemented by some of the material we will give him from the office.  Patient should also utilize other resources like YouTube and Internet to learn more about the blood pressure and the diet.      Varicose veins of lower extremities with ulcer ([place]) - Primary    Stable at the present time        Respiratory   Moderate persistent asthma, uncomplicated    Stable at the present time        Genitourinary   Stage 4 chronic kidney disease ([place])    Refer to nephrologist      Other Visit Diagnoses     Insomnia, unspecified type       Relevant Medications   zolpidem (AMBIEN) 10 MG tablet       Meds ordered this encounter  Medications   zolpidem (AMBIEN) 10 MG tablet    Sig: Take 1 tablet (10 mg total) by mouth at bedtime.    Dispense:  30 tablet    Refill:  2    FOR NEXT FILL    Follow-up: No follow-ups on file.    [name], MD

## 2021-06-24 DIAGNOSIS — J301 Allergic rhinitis due to pollen: Secondary | ICD-10-CM | POA: Diagnosis not present

## 2021-06-24 DIAGNOSIS — J3089 Other allergic rhinitis: Secondary | ICD-10-CM | POA: Diagnosis not present

## 2021-06-30 DIAGNOSIS — J301 Allergic rhinitis due to pollen: Secondary | ICD-10-CM | POA: Diagnosis not present

## 2021-06-30 DIAGNOSIS — J3089 Other allergic rhinitis: Secondary | ICD-10-CM | POA: Diagnosis not present

## 2021-07-01 DIAGNOSIS — J3089 Other allergic rhinitis: Secondary | ICD-10-CM | POA: Diagnosis not present

## 2021-07-01 DIAGNOSIS — J301 Allergic rhinitis due to pollen: Secondary | ICD-10-CM | POA: Diagnosis not present

## 2021-07-06 DIAGNOSIS — J301 Allergic rhinitis due to pollen: Secondary | ICD-10-CM | POA: Diagnosis not present

## 2021-07-06 DIAGNOSIS — J3089 Other allergic rhinitis: Secondary | ICD-10-CM | POA: Diagnosis not present

## 2021-07-13 ENCOUNTER — Other Ambulatory Visit: Payer: Self-pay | Admitting: Internal Medicine

## 2021-07-13 DIAGNOSIS — J301 Allergic rhinitis due to pollen: Secondary | ICD-10-CM | POA: Diagnosis not present

## 2021-07-13 DIAGNOSIS — J3089 Other allergic rhinitis: Secondary | ICD-10-CM | POA: Diagnosis not present

## 2021-07-19 DIAGNOSIS — Z20822 Contact with and (suspected) exposure to covid-19: Secondary | ICD-10-CM | POA: Diagnosis not present

## 2021-07-19 DIAGNOSIS — R051 Acute cough: Secondary | ICD-10-CM | POA: Diagnosis not present

## 2021-07-19 DIAGNOSIS — J329 Chronic sinusitis, unspecified: Secondary | ICD-10-CM | POA: Diagnosis not present

## 2021-07-27 ENCOUNTER — Other Ambulatory Visit: Payer: Self-pay | Admitting: Internal Medicine

## 2021-08-05 ENCOUNTER — Other Ambulatory Visit: Payer: Self-pay | Admitting: Emergency Medicine

## 2021-08-05 DIAGNOSIS — R490 Dysphonia: Secondary | ICD-10-CM

## 2021-08-05 DIAGNOSIS — Z85850 Personal history of malignant neoplasm of thyroid: Secondary | ICD-10-CM

## 2021-08-05 DIAGNOSIS — R0600 Dyspnea, unspecified: Secondary | ICD-10-CM

## 2021-08-05 DIAGNOSIS — R970 Elevated carcinoembryonic antigen [CEA]: Secondary | ICD-10-CM

## 2021-08-06 ENCOUNTER — Other Ambulatory Visit: Payer: Self-pay | Admitting: Emergency Medicine

## 2021-08-06 ENCOUNTER — Other Ambulatory Visit (HOSPITAL_COMMUNITY): Payer: Self-pay | Admitting: Emergency Medicine

## 2021-08-06 DIAGNOSIS — Z85850 Personal history of malignant neoplasm of thyroid: Secondary | ICD-10-CM

## 2021-08-06 DIAGNOSIS — R0600 Dyspnea, unspecified: Secondary | ICD-10-CM

## 2021-08-06 DIAGNOSIS — R970 Elevated carcinoembryonic antigen [CEA]: Secondary | ICD-10-CM

## 2021-08-06 DIAGNOSIS — R490 Dysphonia: Secondary | ICD-10-CM

## 2021-08-12 ENCOUNTER — Encounter (HOSPITAL_COMMUNITY): Payer: Self-pay

## 2021-08-12 ENCOUNTER — Other Ambulatory Visit: Payer: Self-pay

## 2021-08-12 ENCOUNTER — Ambulatory Visit (HOSPITAL_COMMUNITY)
Admission: RE | Admit: 2021-08-12 | Discharge: 2021-08-12 | Disposition: A | Discharge status: Home / Self Care | Payer: Medicare Other | Source: Ambulatory Visit | Attending: Emergency Medicine | Admitting: Emergency Medicine

## 2021-08-12 DIAGNOSIS — Z85850 Personal history of malignant neoplasm of thyroid: Secondary | ICD-10-CM | POA: Diagnosis not present

## 2021-08-12 DIAGNOSIS — R0600 Dyspnea, unspecified: Secondary | ICD-10-CM | POA: Diagnosis present

## 2021-08-12 DIAGNOSIS — R490 Dysphonia: Secondary | ICD-10-CM | POA: Insufficient documentation

## 2021-08-12 DIAGNOSIS — R970 Elevated carcinoembryonic antigen [CEA]: Secondary | ICD-10-CM | POA: Insufficient documentation

## 2021-08-12 MED ORDER — IOHEXOL 300 MG/ML  SOLN
80.0000 mL | Freq: Once | INTRAMUSCULAR | Status: AC | PRN
  Administered 2021-08-12: 80 mL via INTRAVENOUS

## 2021-08-13 LAB — POCT I-STAT CREATININE: Creatinine, Ser: 1.1 mg/dL — ABNORMAL HIGH (ref 0.44–1.00)

## 2021-08-17 ENCOUNTER — Other Ambulatory Visit: Payer: Self-pay | Admitting: Internal Medicine

## 2021-08-17 DIAGNOSIS — G4700 Insomnia, unspecified: Secondary | ICD-10-CM

## 2021-08-24 ENCOUNTER — Other Ambulatory Visit: Payer: Medicare Other

## 2021-08-27 ENCOUNTER — Inpatient Hospital Stay: Payer: Medicare Other

## 2021-08-27 ENCOUNTER — Inpatient Hospital Stay: Payer: Medicare Other | Attending: Internal Medicine | Admitting: Internal Medicine

## 2021-08-27 ENCOUNTER — Other Ambulatory Visit: Payer: Self-pay

## 2021-08-27 ENCOUNTER — Encounter: Payer: Self-pay | Admitting: Internal Medicine

## 2021-08-27 VITALS — BP 142/74 | HR 77 | Temp 98.4°F | Ht 62.0 in | Wt 172.4 lb

## 2021-08-27 DIAGNOSIS — Z85850 Personal history of malignant neoplasm of thyroid: Secondary | ICD-10-CM | POA: Insufficient documentation

## 2021-08-27 DIAGNOSIS — K449 Diaphragmatic hernia without obstruction or gangrene: Secondary | ICD-10-CM | POA: Insufficient documentation

## 2021-08-27 DIAGNOSIS — R1311 Dysphagia, oral phase: Secondary | ICD-10-CM

## 2021-08-27 DIAGNOSIS — I129 Hypertensive chronic kidney disease with stage 1 through stage 4 chronic kidney disease, or unspecified chronic kidney disease: Secondary | ICD-10-CM | POA: Diagnosis not present

## 2021-08-27 DIAGNOSIS — M898X9 Other specified disorders of bone, unspecified site: Secondary | ICD-10-CM | POA: Insufficient documentation

## 2021-08-27 DIAGNOSIS — C73 Malignant neoplasm of thyroid gland: Secondary | ICD-10-CM

## 2021-08-27 DIAGNOSIS — R634 Abnormal weight loss: Secondary | ICD-10-CM | POA: Diagnosis not present

## 2021-08-27 DIAGNOSIS — R970 Elevated carcinoembryonic antigen [CEA]: Secondary | ICD-10-CM

## 2021-08-27 DIAGNOSIS — N183 Chronic kidney disease, stage 3 unspecified: Secondary | ICD-10-CM | POA: Diagnosis not present

## 2021-08-27 DIAGNOSIS — Z8052 Family history of malignant neoplasm of bladder: Secondary | ICD-10-CM | POA: Insufficient documentation

## 2021-08-27 DIAGNOSIS — Z800 Family history of malignant neoplasm of digestive organs: Secondary | ICD-10-CM | POA: Insufficient documentation

## 2021-08-27 DIAGNOSIS — M797 Fibromyalgia: Secondary | ICD-10-CM | POA: Insufficient documentation

## 2021-08-27 DIAGNOSIS — R1310 Dysphagia, unspecified: Secondary | ICD-10-CM | POA: Insufficient documentation

## 2021-08-27 DIAGNOSIS — Z87891 Personal history of nicotine dependence: Secondary | ICD-10-CM | POA: Diagnosis not present

## 2021-08-27 DIAGNOSIS — R978 Other abnormal tumor markers: Secondary | ICD-10-CM | POA: Diagnosis not present

## 2021-08-27 DIAGNOSIS — E039 Hypothyroidism, unspecified: Secondary | ICD-10-CM | POA: Insufficient documentation

## 2021-08-27 LAB — CBC WITH DIFFERENTIAL/PLATELET
Abs Immature Granulocytes: 0.06 10*3/uL (ref 0.00–0.07)
Basophils Absolute: 0.1 10*3/uL (ref 0.0–0.1)
Basophils Relative: 1 %
Eosinophils Absolute: 0.1 10*3/uL (ref 0.0–0.5)
Eosinophils Relative: 1 %
HCT: 38.3 % (ref 36.0–46.0)
Hemoglobin: 12.4 g/dL (ref 12.0–15.0)
Immature Granulocytes: 1 %
Lymphocytes Relative: 14 %
Lymphs Abs: 1.1 10*3/uL (ref 0.7–4.0)
MCH: 29 pg (ref 26.0–34.0)
MCHC: 32.4 g/dL (ref 30.0–36.0)
MCV: 89.7 fL (ref 80.0–100.0)
Monocytes Absolute: 0.6 10*3/uL (ref 0.1–1.0)
Monocytes Relative: 7 %
Neutro Abs: 6.3 10*3/uL (ref 1.7–7.7)
Neutrophils Relative %: 76 %
Platelets: 319 10*3/uL (ref 150–400)
RBC: 4.27 MIL/uL (ref 3.87–5.11)
RDW: 13.5 % (ref 11.5–15.5)
WBC: 8.3 10*3/uL (ref 4.0–10.5)
nRBC: 0 % (ref 0.0–0.2)

## 2021-08-27 LAB — COMPREHENSIVE METABOLIC PANEL
ALT: 17 U/L (ref 0–44)
AST: 17 U/L (ref 15–41)
Albumin: 4.2 g/dL (ref 3.5–5.0)
Alkaline Phosphatase: 70 U/L (ref 38–126)
Anion gap: 6 (ref 5–15)
BUN: 17 mg/dL (ref 8–23)
CO2: 26 mmol/L (ref 22–32)
Calcium: 9.3 mg/dL (ref 8.9–10.3)
Chloride: 101 mmol/L (ref 98–111)
Creatinine, Ser: 1.04 mg/dL — ABNORMAL HIGH (ref 0.44–1.00)
GFR, Estimated: 55 mL/min — ABNORMAL LOW (ref >60)
Glucose, Bld: 98 mg/dL (ref 70–99)
Potassium: 4.3 mmol/L (ref 3.5–5.1)
Sodium: 133 mmol/L — ABNORMAL LOW (ref 135–145)
Total Bilirubin: 0.4 mg/dL (ref 0.3–1.2)
Total Protein: 7 g/dL (ref 6.5–8.1)

## 2021-08-27 NOTE — Progress Notes (Signed)
[facility] OFFICE PROGRESS NOTE  Patient Care Team: P.A., [name], MD as PCP - General (Cardiology) P.A., [name], MD as Referring Physician (Cardiology) Christene Lye, MD (General Surgery)   Cancer Staging  No matching staging information was found for the patient.   Oncology History   No history exists.   JAN 19th, 2013 [Dr. Lenise Arena;  CT chest-  Irregular soft tissue density at the gastroesophageal junction with esophageal obstruction, concerning for underlying malignancy. Recommend endoscopy for further evaluation. 2. Moderate hiatal hernia, new since 2018. CT NECK-Suspicion of a glottic to subglottic mass, possibly with anterior cartilage invasion. Laryngoscopy suggested for further evaluation. No lymphadenopathy.  # Thyroid cancer [2001; 2006- recurrence s/p RAIU x2-[facility]- ENT]  # EGD/Colo-[MARCH 2022; [facility]]-gastritis; colonoscopy normal; no further surveillance [> 66 y]; CKD stage III- [Dr.Singh];  Right breast DCIS- 2018 [on evasita; [facility]; Oncology]- Mam- dec 2022- WNL    HISTORY OF PRESENT ILLNESS: Ambulating independently.  Accompanied by daughter.  [name] 76 y.o.  female pleasant patient has been referred to [place] for concerns of ongoing CT imaging abnormalities/elevated tumor marker.  Patient admits to hoarseness of voice for the last year or so. However symptoms got worse-in the last month or so after she "cold/? COVID-s/p treatment with Ivermectin.    In the process patient was checked for tumor markers-CEA which was elevated at 8.  She had a CT of the neck/chest as above.  Patient was recently evaluated by ENT Dr. Charyl Dancer exam no concern for any mass.  Question reflux/hiatal hernia causing reflux hoarseness of voice and also-dry mouth, inhalers.  Patient notes to have improvement of her hoarseness of voice in the last few weeks since stopping the inhalers.  Patient of note is on amitriptyline the last 3 weeks or  so.  Patient also admits to difficulty swallowing mainly solids/pills/oral phase.  Able to swallow liquids.   Weight loss:yes, 15 pounds/ in 3 months  Hematemesis: none  Bone pain: chronic.   Review of Systems  Constitutional:  Positive for weight loss. Negative for chills, diaphoresis, fever and malaise/fatigue.  HENT:  Negative for nosebleeds and sore throat.   Eyes:  Negative for double vision.  Respiratory:  Negative for cough, hemoptysis, sputum production, shortness of breath and wheezing.   Cardiovascular:  Negative for chest pain, palpitations, orthopnea and leg swelling.  Gastrointestinal:  Negative for abdominal pain, blood in stool, constipation, diarrhea, heartburn, melena, nausea and vomiting.  Genitourinary:  Negative for dysuria, frequency and urgency.  Musculoskeletal:  Positive for back pain, joint pain and myalgias.  Skin: Negative.  Negative for itching and rash.  Neurological:  Negative for dizziness, tingling, focal weakness, weakness and headaches.  Endo/Heme/Allergies:  Does not bruise/bleed easily.  Psychiatric/Behavioral:  Negative for depression. The patient is not nervous/anxious and does not have insomnia.      PAST MEDICAL HISTORY :  Past Medical History:  Diagnosis Date   Asthma    WELL CONTROLLED   Bulging lumbar disc    Cancer ([place]) 1999   thyroid with recurrent 7846   Complication of anesthesia    HEART STOPPED DURING TONSILLECTOMY (AGE 81) DUE TO ALLERGY TO ETHER   DDD (degenerative disc disease), lumbar    DDD (degenerative disc disease), thoracolumbar    Diverticulosis    Family history of adverse reaction to anesthesia    PTS FATHER-UNSURE WHAT HAPPENED   Fibromyalgia    GERD (gastroesophageal reflux disease)    Heart murmur  Hepatitis    AGE 34 "VIRAL"   Hypertension    OFF MEDS CURRENTLY PER PCP ([facility])   Hypothyroidism    Interstitial cystitis    Kidney disease    PONV (postoperative nausea and vomiting)    Thyroid disease      PAST SURGICAL HISTORY :   Past Surgical History:  Procedure Laterality Date   BLADDER SURGERY  1984   BREAST SURGERY  1448,1856   mass removed   BREAST SURGERY  April 2017   Dr Jack Quarto [facility]   CATARACT EXTRACTION  2011   CHOLECYSTECTOMY N/A 11/30/2015   Procedure: LAPAROSCOPIC CHOLECYSTECTOMY WITH INTRAOPERATIVE CHOLANGIOGRAM;  Surgeon: Christene Lye, MD;  Location: [facility] ORS;  Service: General;  Laterality: N/A;   COLONOSCOPY  2005, 2015   COLONOSCOPY WITH PROPOFOL N/A 10/06/2020   Procedure: COLONOSCOPY WITH PROPOFOL;  Surgeon: Lucilla Lame, MD;  Location: [facility] ENDOSCOPY;  Service: Endoscopy;  Laterality: N/A;   DILATION AND CURETTAGE OF UTERUS  1975   ESOPHAGOGASTRODUODENOSCOPY (EGD) WITH PROPOFOL N/A 10/06/2020   Procedure: ESOPHAGOGASTRODUODENOSCOPY (EGD) WITH PROPOFOL;  Surgeon: Lucilla Lame, MD;  Location: [facility] ENDOSCOPY;  Service: Endoscopy;  Laterality: N/A;   EXCISIONAL HEMORRHOIDECTOMY  1975   EYE SURGERY Left 2011   [place]   HERNIA REPAIR  2015   THYROIDECTOMY  1999   TONSILLECTOMY  1952   AGE 62   TONSILLECTOMY      FAMILY HISTORY :   Family History  Problem Relation Age of Onset   Heart disease Mother    Cancer Father        colon   Heart disease Brother    Diabetes Maternal Aunt    Asthma Paternal Aunt    Bladder Cancer Maternal Aunt    Ovarian cancer Neg Hx    Kidney cancer Neg Hx    Prostate cancer Neg Hx     SOCIAL HISTORY:   Social History   Tobacco Use   Smoking status: Former    Packs/day: 1.00    Years: 15.00    Pack years: 15.00    Types: Cigarettes    Quit date: 11/25/1985    Years since quitting: 35.7   Smokeless tobacco: Never  Vaping Use   Vaping Use: Never used  Substance Use Topics   Alcohol use: No   Drug use: No    ALLERGIES:  is allergic to pollen extract, erythromycin, ether, gramineae pollens, montelukast sodium, penicillins, tamsulosin, and band-aid plus antibiotic [bacitracin-polymyxin  b].  MEDICATIONS:  Current Outpatient Medications  Medication Sig Dispense Refill   albuterol (PROVENTIL) (2.5 MG/3ML) 0.083% nebulizer solution Inhale into the lungs.     ALBUTEROL IN Inhale into the lungs as needed.     amitriptyline (ELAVIL) 25 MG tablet Take by mouth.     aspirin 81 MG tablet Take 81 mg by mouth daily.     cetirizine (ZYRTEC) 10 MG tablet Take 10 mg by mouth daily.     Cholecalciferol (VITAMIN D-3) 1000 units CAPS Take by mouth daily.     cyclobenzaprine (FLEXERIL) 5 MG tablet 1/2-1 po bid prn     EPINEPHrine 0.3 mg/0.3 mL IJ SOAJ injection See admin instructions.     FLUoxetine (PROZAC) 40 MG capsule TAKE 1 CAPSULE BY MOUTH EACH MORNING 90 capsule 0   fluticasone (FLONASE) 50 MCG/ACT nasal spray Place 1 spray into both nostrils daily.     HYDROcodone-acetaminophen (NORCO) 7.5-325 MG tablet Take by mouth.     hydrOXYzine (ATARAX/VISTARIL) 25 MG tablet  1 tablet     levothyroxine (SYNTHROID) 150 MCG tablet TAKE 1 TABLET BY MOUTH DAILY 90 tablet 3   olmesartan (BENICAR) 20 MG tablet Take 20 mg by mouth as needed (IF BP IS ELEVATED).     Omega-3 Fatty Acids (FISH OIL) 1000 MG CAPS Take by mouth.     pantoprazole (PROTONIX) 40 MG tablet Take 1 tablet (40 mg total) by mouth daily. 90 tablet 3   predniSONE (DELTASONE) 2.5 MG tablet TAKE ONE TABLET EVERY DAY 30 tablet 6   PRESCRIPTION MEDICATION Allergy injections once weekly     vitamin C (ASCORBIC ACID) 500 MG tablet Take by mouth.     zolpidem (AMBIEN) 10 MG tablet TAKE ONE TABLET BY MOUTH AT BEDTIME 30 tablet 0   ALPRAZolam (XANAX) 0.5 MG tablet SMARTSIG:1 Tablet(s) By Mouth     Calcium 500 MG CHEW Chew by mouth.     Fluticasone-Salmeterol (ADVAIR) 250-50 MCG/DOSE AEPB Inhale 1 puff into the lungs 3 (three) times daily as needed.  (Patient not taking: Reported on 08/27/2021)     furosemide (LASIX) 20 MG tablet Take 1 tablet (20 mg total) by mouth 2 (two) times daily. (Patient not taking: Reported on 08/27/2021) 180 tablet 3    gentamicin cream (GARAMYCIN) 0.1 % SMARTSIG:Sparingly Topical 3 Times a Week     guaiFENesin (MUCINEX) 600 MG 12 hr tablet Take by mouth 2 (two) times daily. (Patient not taking: Reported on 08/27/2021)     polyethylene glycol-electrolytes (NULYTELY) 420 g solution Drink one 8 oz glass every 20 mins until entire container is finished starting at 5:00pm on 10/05/20 4000 mL 0   pregabalin (LYRICA) 50 MG capsule Take by mouth.     tiZANidine (ZANAFLEX) 4 MG capsule Take 4 mg by mouth as needed.     No current facility-administered medications for this visit.    PHYSICAL EXAMINATION:   BP (!) 142/74 (BP Location: Left Arm, Patient Position: Sitting, Cuff Size: Normal)    Pulse 77    Temp 98.4 F (36.9 C) (Tympanic)    Ht 5\' 2"  (1.575 m)    Wt 172 lb 6.4 oz (78.2 kg)    SpO2 98%    BMI 31.53 kg/m   Filed Weights   08/27/21 1119  Weight: 172 lb 6.4 oz (78.2 kg)    Physical Exam Vitals and nursing note reviewed.  HENT:     Head: Normocephalic and atraumatic.     Mouth/Throat:     Pharynx: Oropharynx is clear.  Eyes:     Extraocular Movements: Extraocular movements intact.     Pupils: Pupils are equal, round, and reactive to light.  Cardiovascular:     Rate and Rhythm: Normal rate and regular rhythm.  Pulmonary:     Comments: Decreased breath sounds bilaterally.  Abdominal:     Palpations: Abdomen is soft.  Musculoskeletal:        General: Normal range of motion.     Cervical back: Normal range of motion.  Skin:    General: Skin is warm.  Neurological:     General: No focal deficit present.     Mental Status: She is alert and oriented to person, place, and time.  Psychiatric:        Behavior: Behavior normal.        Judgment: Judgment normal.       LABORATORY DATA:  I have reviewed the data as listed    Component Value Date/Time   NA 136 09/01/2020 1236   NA  137 07/08/2014 1631   K 4.5 09/01/2020 1236   K 3.7 07/08/2014 1631   CL 101 09/01/2020 1236   CL 104  07/08/2014 1631   CO2 26 09/01/2020 1236   CO2 25 07/08/2014 1631   GLUCOSE 79 09/01/2020 1236   GLUCOSE 108 (H) 07/08/2014 1631   BUN 26 (H) 09/01/2020 1236   BUN 13 07/08/2014 1631   CREATININE 1.10 (H) 08/12/2021 1033   CREATININE 1.14 (H) 09/01/2020 1236   CALCIUM 9.9 09/01/2020 1236   CALCIUM 9.5 07/08/2014 1631   PROT 6.6 09/01/2020 1236   PROT 7.5 07/08/2014 1631   ALBUMIN 4.1 07/08/2014 1631   AST 17 09/01/2020 1236   AST 20 07/08/2014 1631   ALT 20 09/01/2020 1236   ALT 39 07/08/2014 1631   ALKPHOS 66 07/08/2014 1631   BILITOT 0.6 09/01/2020 1236   BILITOT 0.5 07/08/2014 1631   GFRNONAA 47 (L) 09/01/2020 1236   GFRAA 54 (L) 09/01/2020 1236    No results found for: SPEP, UPEP  Lab Results  Component Value Date   WBC 8.3 08/27/2021   NEUTROABS 6.3 08/27/2021   HGB 12.4 08/27/2021   HCT 38.3 08/27/2021   MCV 89.7 08/27/2021   PLT 319 08/27/2021      Chemistry      Component Value Date/Time   NA 136 09/01/2020 1236   NA 137 07/08/2014 1631   K 4.5 09/01/2020 1236   K 3.7 07/08/2014 1631   CL 101 09/01/2020 1236   CL 104 07/08/2014 1631   CO2 26 09/01/2020 1236   CO2 25 07/08/2014 1631   BUN 26 (H) 09/01/2020 1236   BUN 13 07/08/2014 1631   CREATININE 1.10 (H) 08/12/2021 1033   CREATININE 1.14 (H) 09/01/2020 1236      Component Value Date/Time   CALCIUM 9.9 09/01/2020 1236   CALCIUM 9.5 07/08/2014 1631   ALKPHOS 66 07/08/2014 1631   AST 17 09/01/2020 1236   AST 20 07/08/2014 1631   ALT 20 09/01/2020 1236   ALT 39 07/08/2014 1631   BILITOT 0.6 09/01/2020 1236   BILITOT 0.5 07/08/2014 1631       RADIOGRAPHIC STUDIES: I have personally reviewed the radiological images as listed and agreed with the findings in the report. No results found.   ASSESSMENT & PLAN:  Dysphagia, oral phase #Difficulty swallowing/dysphonia-January 23 -CT neck -glottic mass; however ENT evaluation negative for any malignancy.  CT chest-esophageal irregularity [facility]  March 2022 EGD-negative for any malignancy; possible hiatal hernia]; want to hold off on EGD at this time  #Discussed with patient/daughter-given the absence of any conclusive findings on ENT evaluation-the fact that the patient's hoarseness is improving-likely secondary to dryness of the oral-pharyngeal mucosa.  Also recommend avoiding amitriptyline.  #Weight loss-question etiology more than 15 pounds in the last 3 months; recommend CT scan abdomen pelvis [noncontrast; CKD]; will also recommend repeating the CEA [8.4 with Dr.Williams in Jan 2023].  Given history of thyroid cancer-recommend checking thyroid antibodies; patient on Synthroid-January 2021 TSH normal.   #Fibromyalgia/?  Psoriatic arthritis versus others- [Dr.Patel]-on prednisone [Dr.Chasnis]; on lyrica-stable.   # Hx of thyroid cancer [1990s]- [Dr.Masound]-on Synthroid  # CKD stage III [Dr.Singh]-plan to get CT noncontrast abdomen pelvis.  Thank you Dr.Wiiliams for allowing me to participate in the care of your pleasant patient. Please do not hesitate to contact me with questions or concerns in the interim.  # DISPOSITION: # labs today- ordered- today # CT scan AP ASAP # follow up 2-3 days  after CT scan-Dr.B  # I reviewed the blood work- with the patient in detail; also reviewed the imaging independently [as summarized above]; and with the patient in detail.     Orders Placed This Encounter  Procedures   CT ABDOMEN PELVIS WO CONTRAST    Standing Status:   Future    Standing Expiration Date:   08/27/2022    Order Specific Question:   Preferred imaging location?    Answer:   [facility]    Order Specific Question:   Is Oral Contrast requested for this exam?    Answer:   Yes, Per Radiology protocol    Order Specific Question:   Radiology Contrast Protocol - do NOT remove file path    Answer:   [email or web address]   CBC with Differential/Platelet    Standing Status:   Future     Number of Occurrences:   1    Standing Expiration Date:   08/27/2022   CEA    Standing Status:   Future    Number of Occurrences:   1    Standing Expiration Date:   08/27/2022   TgAb+Thyroglobulin IMA or RIA    Standing Status:   Future    Number of Occurrences:   1    Standing Expiration Date:   08/27/2022   Comprehensive metabolic panel    Standing Status:   Future    Standing Expiration Date:   08/27/2022   All questions were answered. The patient knows to call the clinic with any problems, questions or concerns.      Cammie Sickle, MD 08/27/2021 1:09 PM

## 2021-08-27 NOTE — Assessment & Plan Note (Addendum)
#  Difficulty swallowing/dysphonia-January 23 -CT neck -glottic mass; however ENT evaluation negative for any malignancy.  CT chest-esophageal irregularity [facility] March 2022 EGD-negative for any malignancy; possible hiatal hernia]; want to hold off on EGD at this time  #Discussed with patient/daughter-given the absence of any conclusive findings on ENT evaluation-the fact that the patient's hoarseness is improving-likely secondary to dryness of the oral-pharyngeal mucosa.  Also recommend avoiding amitriptyline.  #Weight loss-question etiology more than 15 pounds in the last 3 months; recommend CT scan abdomen pelvis [noncontrast; CKD]; will also recommend repeating the CEA [8.4 with Dr.Williams in Jan 2023].  Given history of thyroid cancer-recommend checking thyroid antibodies; patient on Synthroid-January 2021 TSH normal.   #Fibromyalgia/?  Psoriatic arthritis versus others- [Dr.Patel]-on prednisone [Dr.Chasnis]; on lyrica-stable.   # Hx of thyroid cancer [1990s]- [Dr.Masound]-on Synthroid  # CKD stage III [Dr.Singh]-plan to get CT noncontrast abdomen pelvis.  Thank you Dr.Wiiliams for allowing me to participate in the care of your pleasant patient. Please do not hesitate to contact me with questions or concerns in the interim.  # DISPOSITION: # labs today- ordered- today # CT scan AP ASAP # follow up 2-3 days after CT scan-Dr.B  # I reviewed the blood work- with the patient in detail; also reviewed the imaging independently [as summarized above]; and with the patient in detail.

## 2021-08-27 NOTE — Progress Notes (Signed)
Pt does state she uses a hemp oil to help manage her pain.

## 2021-08-28 LAB — CEA: CEA: 5.3 ng/mL — ABNORMAL HIGH (ref 0.0–4.7)

## 2021-08-29 LAB — THYROGLOBULIN BY IMA: Thyroglobulin by IMA: <0.1 ng/mL — ABNORMAL LOW (ref 1.5–38.5)

## 2021-08-29 LAB — TGAB+THYROGLOBULIN IMA OR RIA: Thyroglobulin Antibody: <1 IU/mL (ref 0.0–0.9)

## 2021-09-06 ENCOUNTER — Other Ambulatory Visit: Payer: Self-pay

## 2021-09-06 ENCOUNTER — Ambulatory Visit
Admission: RE | Admit: 2021-09-06 | Discharge: 2021-09-06 | Disposition: A | Discharge status: Home / Self Care | Payer: Medicare Other | Source: Ambulatory Visit | Attending: Internal Medicine | Admitting: Internal Medicine

## 2021-09-06 DIAGNOSIS — R634 Abnormal weight loss: Secondary | ICD-10-CM | POA: Insufficient documentation

## 2021-09-09 ENCOUNTER — Inpatient Hospital Stay (HOSPITAL_BASED_OUTPATIENT_CLINIC_OR_DEPARTMENT_OTHER): Payer: Medicare Other | Admitting: Internal Medicine

## 2021-09-09 ENCOUNTER — Encounter: Payer: Self-pay | Admitting: Internal Medicine

## 2021-09-09 ENCOUNTER — Other Ambulatory Visit: Payer: Self-pay

## 2021-09-09 DIAGNOSIS — R1310 Dysphagia, unspecified: Secondary | ICD-10-CM | POA: Diagnosis not present

## 2021-09-09 DIAGNOSIS — R978 Other abnormal tumor markers: Secondary | ICD-10-CM | POA: Diagnosis not present

## 2021-09-09 NOTE — Progress Notes (Signed)
[place] OFFICE PROGRESS NOTE  Patient Care Team: P.A., [name], MD as PCP - General (Cardiology) P.A., [name], MD as Referring Physician (Cardiology) Christene Lye, MD (General Surgery)   Cancer Staging  No matching staging information was found for the patient.   Oncology History   No history exists.   JAN 19th, 2013 [Dr. Lenise Arena; hoarseness/dysphonia -CT chest-  Irregular soft tissue density at the gastroesophageal junction with esophageal obstruction, concerning for underlying malignancy. Recommend endoscopy for further evaluation. 2. Moderate hiatal hernia, new since 2018. CT NECK-Suspicion of a glottic to subglottic mass, possibly with anterior cartilage invasion. . No lymphadenopathy.  Patient was recently evaluated by ENT Dr. Charyl Dancer exam no concern for any mass.  Question reflux/hiatal hernia causing reflux hoarseness of voice and also-dry mouth, inhalers.  Patient notes to have improvement of her hoarseness of voice in the last few weeks since stopping the inhalers.  ?  Amitriptyline CT AP- FEB 2023-large hiatal hernia causing reflux- [[facility] Dr.Wohl]-surgical evaluation  # Thyroid cancer [2001; 2006- recurrence s/p RAIU x2-[facility]- ENT]  # EGD/Colo-[MARCH 2022; Wohl]-gastritis; colonoscopy normal; no further surveillance [> 66 y]; CKD stage III- [Dr.Singh];  Right breast DCIS- 2018 [on evasita; [facility]; Oncology]- Mam- dec 2022- WNL    HISTORY OF PRESENT ILLNESS: Ambulating independently.  Accompanied by daughter.  [name] 76 y.o.  female pleasant patient is here to review the results of her blood work/CT scan for profound weight loss/elevated tumor markers.  Patient admits to slight improvement of her dysphonia and dysphagia-stopping her allergy inhalers.  Patient also admits to difficulty swallowing mainly solids/pills/oral phase.  Able to swallow liquids.   Review of Systems  Constitutional:  Positive for weight loss.  Negative for chills, diaphoresis, fever and malaise/fatigue.  HENT:  Negative for nosebleeds and sore throat.   Eyes:  Negative for double vision.  Respiratory:  Negative for cough, hemoptysis, sputum production, shortness of breath and wheezing.   Cardiovascular:  Negative for chest pain, palpitations, orthopnea and leg swelling.  Gastrointestinal:  Negative for abdominal pain, blood in stool, constipation, diarrhea, heartburn, melena, nausea and vomiting.  Genitourinary:  Negative for dysuria, frequency and urgency.  Musculoskeletal:  Positive for back pain, joint pain and myalgias.  Skin: Negative.  Negative for itching and rash.  Neurological:  Negative for dizziness, tingling, focal weakness, weakness and headaches.  Endo/Heme/Allergies:  Does not bruise/bleed easily.  Psychiatric/Behavioral:  Negative for depression. The patient is not nervous/anxious and does not have insomnia.      PAST MEDICAL HISTORY :  Past Medical History:  Diagnosis Date   Asthma    WELL CONTROLLED   Bulging lumbar disc    Cancer ([place]) 1999   thyroid with recurrent 0160   Complication of anesthesia    HEART STOPPED DURING TONSILLECTOMY (AGE 43) DUE TO ALLERGY TO ETHER   DDD (degenerative disc disease), lumbar    DDD (degenerative disc disease), thoracolumbar    Diverticulosis    Family history of adverse reaction to anesthesia    PTS FATHER-UNSURE WHAT HAPPENED   Fibromyalgia    GERD (gastroesophageal reflux disease)    Heart murmur    Hepatitis    AGE 83 "VIRAL"   Hypertension    OFF MEDS CURRENTLY PER PCP (MASOUD)   Hypothyroidism    Interstitial cystitis    Kidney disease    PONV (postoperative nausea and vomiting)    Thyroid disease     PAST SURGICAL HISTORY :   Past  Surgical History:  Procedure Laterality Date   BLADDER SURGERY  1984   BREAST SURGERY  [id number],8242   mass removed   BREAST SURGERY  April 2017   Dr Jack Quarto [facility]   CATARACT EXTRACTION  2011    CHOLECYSTECTOMY N/A 11/30/2015   Procedure: LAPAROSCOPIC CHOLECYSTECTOMY WITH INTRAOPERATIVE CHOLANGIOGRAM;  Surgeon: Christene Lye, MD;  Location: [facility] ORS;  Service: General;  Laterality: N/A;   COLONOSCOPY  2005, 2015   COLONOSCOPY WITH PROPOFOL N/A 10/06/2020   Procedure: COLONOSCOPY WITH PROPOFOL;  Surgeon: Lucilla Lame, MD;  Location: [facility] ENDOSCOPY;  Service: Endoscopy;  Laterality: N/A;   DILATION AND CURETTAGE OF UTERUS  1975   ESOPHAGOGASTRODUODENOSCOPY (EGD) WITH PROPOFOL N/A 10/06/2020   Procedure: ESOPHAGOGASTRODUODENOSCOPY (EGD) WITH PROPOFOL;  Surgeon: Lucilla Lame, MD;  Location: [facility] ENDOSCOPY;  Service: Endoscopy;  Laterality: N/A;   EXCISIONAL HEMORRHOIDECTOMY  1975   EYE SURGERY Left 2011   [place]   HERNIA REPAIR  2015   THYROIDECTOMY  1999   TONSILLECTOMY  1952   AGE 73   TONSILLECTOMY      FAMILY HISTORY :   Family History  Problem Relation Age of Onset   Heart disease Mother    Cancer Father        colon   Heart disease Brother    Diabetes Maternal Aunt    Asthma Paternal Aunt    Bladder Cancer Maternal Aunt    Ovarian cancer Neg Hx    Kidney cancer Neg Hx    Prostate cancer Neg Hx     SOCIAL HISTORY:   Social History   Tobacco Use   Smoking status: Former    Packs/day: 1.00    Years: 15.00    Pack years: 15.00    Types: Cigarettes    Quit date: 11/25/1985    Years since quitting: 35.8   Smokeless tobacco: Never  Vaping Use   Vaping Use: Never used  Substance Use Topics   Alcohol use: No   Drug use: No    ALLERGIES:  is allergic to pollen extract, erythromycin, ether, gramineae pollens, montelukast sodium, penicillins, tamsulosin, and band-aid plus antibiotic [bacitracin-polymyxin b].  MEDICATIONS:  Current Outpatient Medications  Medication Sig Dispense Refill   albuterol (PROVENTIL) (2.5 MG/3ML) 0.083% nebulizer solution Inhale into the lungs.     ALBUTEROL IN Inhale into the lungs as  needed.     aspirin 81 MG tablet Take 81 mg by mouth daily.     cetirizine (ZYRTEC) 10 MG tablet Take 10 mg by mouth daily.     Cholecalciferol (VITAMIN D-3) 1000 units CAPS Take by mouth daily.     cyclobenzaprine (FLEXERIL) 5 MG tablet 1/2-1 po bid prn     EPINEPHrine 0.3 mg/0.3 mL IJ SOAJ injection See admin instructions.     FLUoxetine (PROZAC) 40 MG capsule TAKE 1 CAPSULE BY MOUTH EACH MORNING 90 capsule 0   fluticasone (FLONASE) 50 MCG/ACT nasal spray Place 1 spray into both nostrils daily.     Fluticasone-Salmeterol (ADVAIR) 250-50 MCG/DOSE AEPB Inhale 1 puff into the lungs 3 (three) times daily as needed.     HYDROcodone-acetaminophen (NORCO) 7.5-325 MG tablet Take by mouth.     hydrOXYzine (ATARAX/VISTARIL) 25 MG tablet 1 tablet     levothyroxine (SYNTHROID) 150 MCG tablet TAKE 1 TABLET BY MOUTH DAILY 90 tablet 3   olmesartan (BENICAR) 20 MG tablet Take 20 mg by mouth as needed (IF BP IS ELEVATED).     Omega-3 Fatty Acids (FISH OIL)  1000 MG CAPS Take by mouth.     pantoprazole (PROTONIX) 40 MG tablet Take 1 tablet (40 mg total) by mouth daily. 90 tablet 3   predniSONE (DELTASONE) 2.5 MG tablet TAKE ONE TABLET EVERY DAY 30 tablet 6   PRESCRIPTION MEDICATION Allergy injections once weekly     vitamin C (ASCORBIC ACID) 500 MG tablet Take by mouth.     zolpidem (AMBIEN) 10 MG tablet TAKE ONE TABLET BY MOUTH AT BEDTIME 30 tablet 0   ALPRAZolam (XANAX) 0.5 MG tablet SMARTSIG:1 Tablet(s) By Mouth     amitriptyline (ELAVIL) 25 MG tablet Take by mouth.     Calcium 500 MG CHEW Chew by mouth.     furosemide (LASIX) 20 MG tablet Take 1 tablet (20 mg total) by mouth 2 (two) times daily. (Patient not taking: Reported on 08/27/2021) 180 tablet 3   gentamicin cream (GARAMYCIN) 0.1 % SMARTSIG:Sparingly Topical 3 Times a Week     guaiFENesin (MUCINEX) 600 MG 12 hr tablet Take by mouth 2 (two) times daily. (Patient not taking: Reported on 08/27/2021)     polyethylene  glycol-electrolytes (NULYTELY) 420 g solution Drink one 8 oz glass every 20 mins until entire container is finished starting at 5:00pm on 10/05/20 4000 mL 0   pregabalin (LYRICA) 50 MG capsule Take by mouth.     tiZANidine (ZANAFLEX) 4 MG capsule Take 4 mg by mouth as needed.     No current facility-administered medications for this visit.    PHYSICAL EXAMINATION:   BP 121/72 (BP Location: Left Arm, Patient Position: Sitting, Cuff Size: Normal)    Pulse 75    Temp 98.5 F (36.9 C) (Tympanic)    Ht 5\' 2"  (1.575 m)    Wt 168 lb 6.4 oz (76.4 kg)    SpO2 98%    BMI 30.80 kg/m   Filed Weights   09/09/21 1140  Weight: 168 lb 6.4 oz (76.4 kg)    Physical Exam Vitals and nursing note reviewed.  HENT:     Head: Normocephalic and atraumatic.     Mouth/Throat:     Pharynx: Oropharynx is clear.  Eyes:     Extraocular Movements: Extraocular movements intact.     Pupils: Pupils are equal, round, and reactive to light.  Cardiovascular:     Rate and Rhythm: Normal rate and regular rhythm.  Pulmonary:     Comments: Decreased breath sounds bilaterally.  Abdominal:     Palpations: Abdomen is soft.  Musculoskeletal:        General: Normal range of motion.     Cervical back: Normal range of motion.  Skin:    General: Skin is warm.  Neurological:     General: No focal deficit present.     Mental Status: She is alert and oriented to person, place, and time.  Psychiatric:        Behavior: Behavior normal.        Judgment: Judgment normal.       LABORATORY DATA:  I have reviewed the data as listed    Component Value Date/Time   NA 133 (L) 08/27/2021 1223   NA 137 07/08/2014 1631   K 4.3 08/27/2021 1223   K 3.7 07/08/2014 1631   CL 101 08/27/2021 1223   CL 104 07/08/2014 1631   CO2 26 08/27/2021 1223   CO2 25 07/08/2014 1631   GLUCOSE 98 08/27/2021 1223   GLUCOSE 108 (H) 07/08/2014 1631   BUN 17 08/27/2021 1223   BUN 13 07/08/2014  1631   CREATININE 1.04 (H) 08/27/2021 1223    CREATININE 1.14 (H) 09/01/2020 1236   CALCIUM 9.3 08/27/2021 1223   CALCIUM 9.5 07/08/2014 1631   PROT 7.0 08/27/2021 1223   PROT 7.5 07/08/2014 1631   ALBUMIN 4.2 08/27/2021 1223   ALBUMIN 4.1 07/08/2014 1631   AST 17 08/27/2021 1223   AST 20 07/08/2014 1631   ALT 17 08/27/2021 1223   ALT 39 07/08/2014 1631   ALKPHOS 70 08/27/2021 1223   ALKPHOS 66 07/08/2014 1631   BILITOT 0.4 08/27/2021 1223   BILITOT 0.5 07/08/2014 1631   GFRNONAA 55 (L) 08/27/2021 1223   GFRNONAA 47 (L) 09/01/2020 1236   GFRAA 54 (L) 09/01/2020 1236    No results found for: SPEP, UPEP  Lab Results  Component Value Date   WBC 8.3 08/27/2021   NEUTROABS 6.3 08/27/2021   HGB 12.4 08/27/2021   HCT 38.3 08/27/2021   MCV 89.7 08/27/2021   PLT 319 08/27/2021      Chemistry      Component Value Date/Time   NA 133 (L) 08/27/2021 1223   NA 137 07/08/2014 1631   K 4.3 08/27/2021 1223   K 3.7 07/08/2014 1631   CL 101 08/27/2021 1223   CL 104 07/08/2014 1631   CO2 26 08/27/2021 1223   CO2 25 07/08/2014 1631   BUN 17 08/27/2021 1223   BUN 13 07/08/2014 1631   CREATININE 1.04 (H) 08/27/2021 1223   CREATININE 1.14 (H) 09/01/2020 1236      Component Value Date/Time   CALCIUM 9.3 08/27/2021 1223   CALCIUM 9.5 07/08/2014 1631   ALKPHOS 70 08/27/2021 1223   ALKPHOS 66 07/08/2014 1631   AST 17 08/27/2021 1223   AST 20 07/08/2014 1631   ALT 17 08/27/2021 1223   ALT 39 07/08/2014 1631   BILITOT 0.4 08/27/2021 1223   BILITOT 0.5 07/08/2014 1631       RADIOGRAPHIC STUDIES: I have personally reviewed the radiological images as listed and agreed with the findings in the report. No results found.   ASSESSMENT & PLAN:  Abnormal tumor markers #Abnormal tumor-CEA [slightly improved at 5.4;  [8.4 with Dr.Williams in Jan 2023]-CT scan abdomen pelvis noncontrast-negative for any malignant process explaining patient's slightly elevated CEA.  Suspect inflammatory cause.  Recommend follow-up in 6  months.  #Weight loss- FEB 2023- CT scan abdomen pelvis [noncontrast; CKD]-unclear etiology question dysphagia [reflux see below]-thyroid markers negative for any recurrent thyroid malignancy.   # Difficulty swallowing/dysphonia-[January 23 -CT neck -glottic mass; however ENT evaluation negative for any malignancy].  CT chest-esophageal irregularity [facility] March 2022 EGD-negative for any malignancy; possible hiatal hernia]; February 2023 CT scan shows significantly large hiatal hernia.  Exacerbating her reflux-causing the symptoms.  Patient currently off her allergy medication-improved.  Discussed with Dr.Wohl-recommend follow-up with PCP regarding surgical evaluation for hiatal hernia.  For now recommend Protonix twice a day.  #Fibromyalgia/?  Psoriatic arthritis versus others- [Dr.Patel]-on prednisone [Dr.Chasnis]; on lyrica-STABLE.   # Hx of thyroid cancer [1990s]- [Dr.Masound]-on Synthroid  # CKD stage III [Dr.Singh]-plan to get CT noncontrast abdomen pelvis.  #Incidental findings on Imaging CT FEB, 2023: Atherosclerosis I reviewed/discussed/counseled the patient/daughter.   # DISPOSITION: # follow up in 6 months- MD; 2-3 days prior labs- cbc/bmp;cea-Dr.B  Cc; Dr.Jonathan Williams/PCP         Orders Placed This Encounter  Procedures   CBC with Differential/Platelet    Standing Status:   Future    Standing Expiration Date:   09/09/2022  CEA    Standing Status:   Future    Standing Expiration Date:   09/09/2022   All questions were answered. The patient knows to call the clinic with any problems, questions or concerns.      Cammie Sickle, MD 09/10/2021 8:45 AM

## 2021-09-09 NOTE — Patient Instructions (Signed)
#  Take Protonix twice a day-for reflux/hiatal hernia.

## 2021-09-09 NOTE — Assessment & Plan Note (Addendum)
#  Abnormal tumor-CEA [slightly improved at 5.4;  [8.4 with Dr.Williams in Jan 2023]-CT scan abdomen pelvis noncontrast-negative for any malignant process explaining patient's slightly elevated CEA.  Suspect inflammatory cause.  Recommend follow-up in 6 months.  #Weight loss- FEB 2023- CT scan abdomen pelvis [noncontrast; CKD]-unclear etiology question dysphagia [reflux see below]-thyroid markers negative for any recurrent thyroid malignancy.   # Difficulty swallowing/dysphonia-[January 23 -CT neck -glottic mass; however ENT evaluation negative for any malignancy].  CT chest-esophageal irregularity [facility] March 2022 EGD-negative for any malignancy; possible hiatal hernia]; February 2023 CT scan shows significantly large hiatal hernia.  Exacerbating her reflux-causing the symptoms.  Patient currently off her allergy medication-improved.  Discussed with Dr.Wohl-recommend follow-up with PCP regarding surgical evaluation for hiatal hernia.  For now recommend Protonix twice a day.  #Fibromyalgia/?  Psoriatic arthritis versus others- [Dr.Patel]-on prednisone [Dr.Chasnis]; on lyrica-STABLE.   # Hx of thyroid cancer [1990s]- [Dr.Masound]-on Synthroid  # CKD stage III [Dr.Singh]-plan to get CT noncontrast abdomen pelvis.  #Incidental findings on Imaging CT FEB, 2023: Atherosclerosis I reviewed/discussed/counseled the patient/daughter.   # DISPOSITION: # follow up in 6 months- MD; 2-3 days prior labs- cbc/bmp;cea-Dr.B  Cc; Dr.Jonathan Williams/PCP

## 2021-09-09 NOTE — Progress Notes (Signed)
CT results. Can you compare the results of the [place] scan to the one she just had?

## 2021-09-22 ENCOUNTER — Other Ambulatory Visit: Payer: Self-pay | Admitting: Internal Medicine

## 2021-09-29 ENCOUNTER — Other Ambulatory Visit: Payer: Self-pay

## 2021-09-29 ENCOUNTER — Encounter: Payer: Self-pay | Admitting: Obstetrics and Gynecology

## 2021-09-29 ENCOUNTER — Ambulatory Visit (INDEPENDENT_AMBULATORY_CARE_PROVIDER_SITE_OTHER): Payer: Medicare Other | Admitting: Obstetrics and Gynecology

## 2021-09-29 VITALS — BP 130/76 | HR 75 | Ht 62.0 in | Wt 166.5 lb

## 2021-09-29 DIAGNOSIS — Z780 Asymptomatic menopausal state: Secondary | ICD-10-CM

## 2021-09-29 DIAGNOSIS — K649 Unspecified hemorrhoids: Secondary | ICD-10-CM | POA: Diagnosis not present

## 2021-09-29 DIAGNOSIS — Z01419 Encounter for gynecological examination (general) (routine) without abnormal findings: Secondary | ICD-10-CM | POA: Diagnosis not present

## 2021-09-29 MED ORDER — HYDROCORTISONE ACETATE 25 MG RE SUPP: 25.0000 mg | Freq: Every day | RECTAL | 0 refills | Status: AC | PRN

## 2021-09-29 NOTE — Progress Notes (Signed)
HPI:      [name] is a 76 y.o. [phone] who LMP was No LMP recorded. Patient is postmenopausal.  Subjective:   She presents today for her annual examination.  She says she is having a good day today.  She reports that she continues to take vitamin D and calcium.  She has been getting annual follow-ups because of her "history of breast cancer".  She states she recently had an elevated CEA and underwent multiple CAT scans but no source of problems were found.  This did result in the diagnosis of hiatal hernia.  She is currently considering surgery for this. She does state that she occasionally has hemorrhoids (she previously had hemorrhoidal surgery more than 40 years ago) and she states she never wants to have them recur like that again.  She is requesting occasional use of Anusol suppositories.    Hx: The following portions of the patient's history were reviewed and updated as appropriate:             She  has a past medical history of Arthritis, Asthma, Bulging lumbar disc, Cancer ([place]) (5885), Complication of anesthesia, DDD (degenerative disc disease), lumbar, DDD (degenerative disc disease), thoracolumbar, Diverticulosis, Family history of adverse reaction to anesthesia, Fibromyalgia, Fibromyalgia, GERD (gastroesophageal reflux disease), Heart murmur, Hepatitis, Hypertension, Hypothyroidism, Interstitial cystitis, Kidney disease, PONV (postoperative nausea and vomiting), and Thyroid disease. She does not have any pertinent problems on file. She  has a past surgical history that includes Tonsillectomy (1952); Excisional hemorrhoidectomy (1975); Thyroidectomy (1999); Bladder surgery (1984); Colonoscopy (2005, 2015); Hemorrhoid surgery (1975); Hernia repair (2015); Cataract extraction (2011); Dilation and curettage of uterus (1975); Breast surgery (0277,4128); Breast surgery (April 2017); Eye surgery (Left, 2011); Cholecystectomy (N/A, 11/30/2015); Tonsillectomy; Colonoscopy with propofol  (N/A, 10/06/2020); and Esophagogastroduodenoscopy (egd) with propofol (N/A, 10/06/2020). Her family history includes Asthma in her paternal aunt; Bladder Cancer in her maternal aunt; Cancer in her father; Diabetes in her maternal aunt; Heart disease in her brother and mother. She  reports that she quit smoking about 35 years ago. Her smoking use included cigarettes. She has a 15.00 pack-year smoking history. She has never used smokeless tobacco. She reports that she does not drink alcohol and does not use drugs. She has a current medication list which includes the following prescription(s): albuterol, albuterol, aspirin, calcium, cetirizine, vitamin d-3, cyclobenzaprine, epinephrine, fluoxetine, fluticasone, guaifenesin, hydrocodone-acetaminophen, hydrocortisone, hydroxychloroquine, hydroxyzine, levothyroxine, olmesartan, fish oil, pantoprazole, prednisone, PRESCRIPTION MEDICATION, vitamin c, and zolpidem. She is allergic to pollen extract, erythromycin, ether, gramineae pollens, montelukast sodium, penicillins, tamsulosin, and band-aid plus antibiotic [bacitracin-polymyxin b].       Review of Systems:  Review of Systems  Constitutional: Denied constitutional symptoms, night sweats, recent illness, fatigue, fever, insomnia and weight loss.  Eyes: Denied eye symptoms, eye pain, photophobia, vision change and visual disturbance.  Ears/Nose/Throat/Neck: Denied ear, nose, throat or neck symptoms, hearing loss, nasal discharge, sinus congestion and sore throat.  Cardiovascular: Denied cardiovascular symptoms, arrhythmia, chest pain/pressure, edema, exercise intolerance, orthopnea and palpitations.  Respiratory: Denied pulmonary symptoms, asthma, pleuritic pain, productive sputum, cough, dyspnea and wheezing.  Gastrointestinal: Denied, gastro-esophageal reflux, melena, nausea and vomiting.  Genitourinary: See HPI for additional information.  Musculoskeletal: Denied musculoskeletal symptoms, stiffness,  swelling, muscle weakness and myalgia.  Dermatologic: Denied dermatology symptoms, rash and scar.  Neurologic: Denied neurology symptoms, dizziness, headache, neck pain and syncope.  Psychiatric: Denied psychiatric symptoms, anxiety and depression.  Endocrine: Denied endocrine symptoms including hot flashes and night sweats.  Meds:   Current Outpatient Medications on File Prior to Visit  Medication Sig Dispense Refill   albuterol (PROVENTIL) (2.5 MG/3ML) 0.083% nebulizer solution Inhale into the lungs.     ALBUTEROL IN Inhale into the lungs as needed.     aspirin 81 MG tablet Take 81 mg by mouth daily.     Calcium 500 MG CHEW Chew by mouth once a week.     cetirizine (ZYRTEC) 10 MG tablet Take 10 mg by mouth daily.     Cholecalciferol (VITAMIN D-3) 1000 units CAPS Take by mouth daily.     cyclobenzaprine (FLEXERIL) 5 MG tablet 1/2-1 po bid prn     EPINEPHrine 0.3 mg/0.3 mL IJ SOAJ injection See admin instructions.     FLUoxetine (PROZAC) 40 MG capsule TAKE 1 CAPSULE BY MOUTH EACH MORNING 90 capsule 0   fluticasone (FLONASE) 50 MCG/ACT nasal spray Place 1 spray into both nostrils daily.     guaiFENesin (MUCINEX) 600 MG 12 hr tablet Take by mouth 2 (two) times daily.     HYDROcodone-acetaminophen (NORCO) 7.5-325 MG tablet Take by mouth.     hydroxychloroquine (PLAQUENIL) 200 MG tablet Take by mouth daily.     hydrOXYzine (ATARAX/VISTARIL) 25 MG tablet 1 tablet     levothyroxine (SYNTHROID) 150 MCG tablet TAKE 1 TABLET BY MOUTH DAILY 90 tablet 3   olmesartan (BENICAR) 20 MG tablet Take 20 mg by mouth as needed (IF BP IS ELEVATED).     Omega-3 Fatty Acids (FISH OIL) 1000 MG CAPS Take by mouth.     pantoprazole (PROTONIX) 40 MG tablet Take 1 tablet (40 mg total) by mouth daily. 90 tablet 3   predniSONE (DELTASONE) 2.5 MG tablet TAKE ONE TABLET EVERY DAY 30 tablet 6   PRESCRIPTION MEDICATION Allergy injections once weekly     vitamin C (ASCORBIC ACID) 500 MG tablet Take by mouth.      zolpidem (AMBIEN) 10 MG tablet TAKE ONE TABLET BY MOUTH AT BEDTIME 30 tablet 0   No current facility-administered medications on file prior to visit.    Objective:     Vitals:   09/29/21 1320  BP: 130/76  Pulse: 75    Filed Weights   09/29/21 1320  Weight: 166 lb 8 oz (75.5 kg)              Physical examination General NAD, Conversant  HEENT Atraumatic; Op clear with mmm.  Normo-cephalic. Pupils reactive. Anicteric sclerae  Thyroid/Neck Smooth without nodularity or enlargement. Normal ROM.  Neck Supple.  Skin No rashes, lesions or ulceration. Normal palpated skin turgor. No nodularity.  Breasts: No masses or discharge.  Symmetric.  No axillary adenopathy.  Lungs: Clear to auscultation.No rales or wheezes. Normal Respiratory effort, no retractions.  Heart: NSR.  No murmurs or rubs appreciated. No periferal edema  Abdomen: Soft.  Non-tender.  No masses.  No HSM. No hernia  Extremities: Moves all appropriately.  Normal ROM for age. No lymphadenopathy.  Neuro: Oriented to PPT.  Normal mood. Normal affect.     Pelvic:   Vulva: Normal appearance.  No lesions.  Vagina: No lesions or abnormalities noted.  Atrophic  Support: Normal pelvic support.  Urethra No masses tenderness or scarring.  Meatus Normal size without lesions or prolapse.  Cervix: Normal appearance.  No lesions.  Anus: Normal exam.  No lesions.  Hemorrhoidal tags noted but no significant hemorrhoids seen  Perineum: Normal exam.  No lesions.        Bimanual   Uterus:  Normal size.  Non-tender.  Mobile.  AV.  Adnexae: No masses.  Non-tender to palpation.  Cul-de-sac: Negative for abnormality.     Assessment:    G2P2002 Patient Active Problem List   Diagnosis Date Noted   Abnormal tumor markers 09/09/2021   Weight loss, abnormal 08/27/2021   Family history of malignant neoplasm of gastrointestinal tract    Allergic rhinitis 08/27/2020   Allergic rhinitis due to animal (cat) (dog) hair and dander 08/27/2020    Moderate persistent asthma, uncomplicated 26/37/8588   Pruritus 08/27/2020   Lower limb ulcer, calf, left, limited to breakdown of skin ([place]) 08/14/2020   Stage 3 chronic kidney disease ([place]) 08/14/2020   Swelling of limb 08/14/2020   Varicose veins of lower extremities with ulcer ([place]) 08/14/2020   Dysphagia, oral phase 07/03/2020   Hyperlipidemia 06/16/2020   Primary insomnia 06/16/2020   Seasonal allergic rhinitis due to pollen 05/26/2020   Cellulitis and abscess of left leg 05/26/2020   Pyuria 03/11/2020   Renal stone 09/11/2019   Acquired hammer toe 08/08/2019   Myalgia 05/09/2019   Right anterior knee pain 02/06/2019   Multiple joint pain 01/23/2019   Screening for osteoporosis 01/23/2019   Chronic venous insufficiency 07/28/2017   Essential hypertension 07/27/2017   Stage 4 chronic kidney disease ([place]) 05/26/2017   Visual disturbance 05/26/2017   Lower extremity pain, bilateral 05/26/2017   Bilateral lower extremity edema 05/26/2017   PMB (postmenopausal bleeding) 12/24/2015   Hemorrhoid 12/24/2015   Neoplasm of breast, primary tumor staging category Tis: lobular carcinoma in situ (LCIS) 12/24/2015   Lobular carcinoma in situ of unspecified breast 12/24/2015   Neuritis or radiculitis due to rupture of lumbar intervertebral disc 05/23/2014   Degeneration of intervertebral disc of lumbar region 05/23/2014     1. Well woman exam with routine gynecological exam   2. Postmenopausal   3. Hemorrhoids, unspecified hemorrhoid type        Plan:            1.  Basic Screening Recommendations The basic screening recommendations for asymptomatic women were discussed with the patient during her visit.  The age-appropriate recommendations were discussed with her and the rational for the tests reviewed.  When I am informed by the patient that another primary care physician has previously obtained the age-appropriate tests and they are up-to-date, only outstanding tests are ordered  and referrals given as necessary.  Abnormal results of tests will be discussed with her when all of her results are completed.  Routine preventative health maintenance measures emphasized: Exercise/Diet/Weight control, Tobacco Warnings, Alcohol/Substance use risks and Stress Management DEXA scan ordered 2.  Anusol suppositories for recurrence of hemorrhoids  Orders Orders Placed This Encounter  Procedures   DG Bone Density     Meds ordered this encounter  Medications   hydrocortisone (ANUSOL-HC) 25 MG suppository    Sig: Place 1 suppository (25 mg total) rectally daily as needed for hemorrhoids or anal itching.    Dispense:  15 suppository    Refill:  0          F/U  Return in about 1 year (around 09/30/2022) for Annual Physical.  Finis Bud, M.D. 09/29/2021 2:00 PM

## 2021-09-29 NOTE — Progress Notes (Signed)
Patients presents for annual exam today. Patient states feeling well. Patient is up to date on pap smear, mammogram and colonoscopy. She states she occasionally has trouble with hemorrhoids and would like to discuss preventive treatments. Patient states no other questions or concerns at this time.   ?

## 2021-12-06 ENCOUNTER — Other Ambulatory Visit: Payer: Self-pay | Admitting: Physician Assistant

## 2021-12-06 MED ORDER — PANTOPRAZOLE SODIUM 40 MG PO TBEC: 40.0000 mg | DELAYED_RELEASE_TABLET | Freq: Every day | ORAL | 3 refills | Status: AC

## 2022-03-07 ENCOUNTER — Inpatient Hospital Stay: Payer: Medicare Other | Attending: Oncology

## 2022-03-07 DIAGNOSIS — N183 Chronic kidney disease, stage 3 unspecified: Secondary | ICD-10-CM | POA: Insufficient documentation

## 2022-03-07 DIAGNOSIS — I129 Hypertensive chronic kidney disease with stage 1 through stage 4 chronic kidney disease, or unspecified chronic kidney disease: Secondary | ICD-10-CM | POA: Diagnosis not present

## 2022-03-07 DIAGNOSIS — Z8052 Family history of malignant neoplasm of bladder: Secondary | ICD-10-CM | POA: Insufficient documentation

## 2022-03-07 DIAGNOSIS — E039 Hypothyroidism, unspecified: Secondary | ICD-10-CM | POA: Diagnosis not present

## 2022-03-07 DIAGNOSIS — M797 Fibromyalgia: Secondary | ICD-10-CM | POA: Diagnosis not present

## 2022-03-07 DIAGNOSIS — M1990 Unspecified osteoarthritis, unspecified site: Secondary | ICD-10-CM | POA: Insufficient documentation

## 2022-03-07 DIAGNOSIS — Z87891 Personal history of nicotine dependence: Secondary | ICD-10-CM | POA: Insufficient documentation

## 2022-03-07 DIAGNOSIS — R1310 Dysphagia, unspecified: Secondary | ICD-10-CM | POA: Diagnosis not present

## 2022-03-07 DIAGNOSIS — R978 Other abnormal tumor markers: Secondary | ICD-10-CM

## 2022-03-07 DIAGNOSIS — Z800 Family history of malignant neoplasm of digestive organs: Secondary | ICD-10-CM | POA: Diagnosis not present

## 2022-03-07 DIAGNOSIS — Z85850 Personal history of malignant neoplasm of thyroid: Secondary | ICD-10-CM | POA: Diagnosis not present

## 2022-03-07 DIAGNOSIS — R970 Elevated carcinoembryonic antigen [CEA]: Secondary | ICD-10-CM | POA: Insufficient documentation

## 2022-03-07 LAB — CBC WITH DIFFERENTIAL/PLATELET
Abs Immature Granulocytes: 0.04 10*3/uL (ref 0.00–0.07)
Basophils Absolute: 0.1 10*3/uL (ref 0.0–0.1)
Basophils Relative: 1 %
Eosinophils Absolute: 0.1 10*3/uL (ref 0.0–0.5)
Eosinophils Relative: 1 %
HCT: 35.7 % — ABNORMAL LOW (ref 36.0–46.0)
Hemoglobin: 12 g/dL (ref 12.0–15.0)
Immature Granulocytes: 1 %
Lymphocytes Relative: 18 %
Lymphs Abs: 1.3 10*3/uL (ref 0.7–4.0)
MCH: 30.2 pg (ref 26.0–34.0)
MCHC: 33.6 g/dL (ref 30.0–36.0)
MCV: 89.9 fL (ref 80.0–100.0)
Monocytes Absolute: 0.6 10*3/uL (ref 0.1–1.0)
Monocytes Relative: 9 %
Neutro Abs: 4.9 10*3/uL (ref 1.7–7.7)
Neutrophils Relative %: 70 %
Platelets: 345 10*3/uL (ref 150–400)
RBC: 3.97 MIL/uL (ref 3.87–5.11)
RDW: 12.8 % (ref 11.5–15.5)
WBC: 7.1 10*3/uL (ref 4.0–10.5)
nRBC: 0 % (ref 0.0–0.2)

## 2022-03-08 LAB — CEA: CEA: 4.8 ng/mL — ABNORMAL HIGH (ref 0.0–4.7)

## 2022-03-09 ENCOUNTER — Encounter: Payer: Self-pay | Admitting: Internal Medicine

## 2022-03-09 ENCOUNTER — Inpatient Hospital Stay (HOSPITAL_BASED_OUTPATIENT_CLINIC_OR_DEPARTMENT_OTHER): Payer: Medicare Other | Admitting: Internal Medicine

## 2022-03-09 DIAGNOSIS — R970 Elevated carcinoembryonic antigen [CEA]: Secondary | ICD-10-CM | POA: Diagnosis not present

## 2022-03-09 DIAGNOSIS — R978 Other abnormal tumor markers: Secondary | ICD-10-CM | POA: Diagnosis not present

## 2022-03-09 NOTE — Progress Notes (Signed)
Concerned with labs done by Dr. Posey Pronto last week, she would like for you to look at.

## 2022-03-09 NOTE — Progress Notes (Signed)
[facility] OFFICE PROGRESS NOTE  Patient Care Team: Walker Kehr, MD as PCP - General (Internal Medicine) Christene Lye, MD (General Surgery)   Cancer Staging  No matching staging information was found for the patient.   Oncology History   No history exists.   JAN 19th, 2013 [Dr. Lenise Arena; hoarseness/dysphonia -CT chest-  Irregular soft tissue density at the gastroesophageal junction with esophageal obstruction, concerning for underlying malignancy. Recommend endoscopy for further evaluation. 2. Moderate hiatal hernia, new since 2018. CT NECK-Suspicion of a glottic to subglottic mass, possibly with anterior cartilage invasion. . No lymphadenopathy.  Patient was recently evaluated by ENT Dr. Charyl Dancer exam no concern for any mass.  Question reflux/hiatal hernia causing reflux hoarseness of voice and also-dry mouth, inhalers.  Patient notes to have improvement of her hoarseness of voice in the last few weeks since stopping the inhalers.  ?  Amitriptyline CT AP- FEB 2023-large hiatal hernia causing reflux- [[facility] Dr.Wohl]-surgical evaluation  # Thyroid cancer [2001; 2006- recurrence s/p RAIU x2-[facility]- ENT]  # EGD/Colo-[MARCH 2022; Wohl]-gastritis; colonoscopy normal; no further surveillance [> 66 y]; CKD stage III- [Dr.Singh];  Right breast DCIS- 2018 [on evasita; [facility]; Oncology]- Mam- dec 2022- WNL    HISTORY OF PRESENT ILLNESS: Ambulating independently.  Alone.  [name] 76 y.o.  female pleasant patient is here to follow up/review the results of elevated tumor markers.  Patient follows up with rheumatology for fibromyalgia/arthritis had CEA done.   Patient admits to slight improvement of her dysphonia and dysphagia.  Patient also admits to difficulty swallowing mainly solids/pills/oral phase.  Able to swallow liquids.   Review of Systems  Constitutional:  Positive for weight loss. Negative for chills, diaphoresis, fever and  malaise/fatigue.  HENT:  Negative for nosebleeds and sore throat.   Eyes:  Negative for double vision.  Respiratory:  Negative for cough, hemoptysis, sputum production, shortness of breath and wheezing.   Cardiovascular:  Negative for chest pain, palpitations, orthopnea and leg swelling.  Gastrointestinal:  Negative for abdominal pain, blood in stool, constipation, diarrhea, heartburn, melena, nausea and vomiting.  Genitourinary:  Negative for dysuria, frequency and urgency.  Musculoskeletal:  Positive for back pain, joint pain and myalgias.  Skin: Negative.  Negative for itching and rash.  Neurological:  Negative for dizziness, tingling, focal weakness, weakness and headaches.  Endo/Heme/Allergies:  Does not bruise/bleed easily.  Psychiatric/Behavioral:  Negative for depression. The patient is not nervous/anxious and does not have insomnia.       PAST MEDICAL HISTORY :  Past Medical History:  Diagnosis Date   Arthritis    Asthma    WELL CONTROLLED   Bulging lumbar disc    Cancer ([place]) 1999   thyroid with recurrent 4332   Complication of anesthesia    HEART STOPPED DURING TONSILLECTOMY (AGE 1) DUE TO ALLERGY TO ETHER   DDD (degenerative disc disease), lumbar    DDD (degenerative disc disease), thoracolumbar    Diverticulosis    Family history of adverse reaction to anesthesia    PTS FATHER-UNSURE WHAT HAPPENED   Fibromyalgia    Fibromyalgia    GERD (gastroesophageal reflux disease)    Heart murmur    Hepatitis    AGE 77 "VIRAL"   Hypertension    OFF MEDS CURRENTLY PER PCP ([facility])   Hypothyroidism    Interstitial cystitis    Kidney disease    PONV (postoperative nausea and vomiting)    Thyroid disease     PAST SURGICAL HISTORY :  Past Surgical History:  Procedure Laterality Date   BLADDER SURGERY  1984   BREAST SURGERY  [id number]   mass removed   BREAST SURGERY  April 2017   Dr Jack Quarto [facility]   CATARACT EXTRACTION  2011   CHOLECYSTECTOMY N/A 11/30/2015    Procedure: LAPAROSCOPIC CHOLECYSTECTOMY WITH INTRAOPERATIVE CHOLANGIOGRAM;  Surgeon: Christene Lye, MD;  Location: [facility] ORS;  Service: General;  Laterality: N/A;   COLONOSCOPY  2005, 2015   COLONOSCOPY WITH PROPOFOL N/A 10/06/2020   Procedure: COLONOSCOPY WITH PROPOFOL;  Surgeon: Lucilla Lame, MD;  Location: [facility] ENDOSCOPY;  Service: Endoscopy;  Laterality: N/A;   DILATION AND CURETTAGE OF UTERUS  1975   ESOPHAGOGASTRODUODENOSCOPY (EGD) WITH PROPOFOL N/A 10/06/2020   Procedure: ESOPHAGOGASTRODUODENOSCOPY (EGD) WITH PROPOFOL;  Surgeon: Lucilla Lame, MD;  Location: [facility] ENDOSCOPY;  Service: Endoscopy;  Laterality: N/A;   EXCISIONAL HEMORRHOIDECTOMY  1975   EYE SURGERY Left 2011   [facility]   HERNIA REPAIR  2015   THYROIDECTOMY  1999   TONSILLECTOMY  1952   AGE 71   TONSILLECTOMY      FAMILY HISTORY :   Family History  Problem Relation Age of Onset   Heart disease Mother    Cancer Father        colon   Heart disease Brother    Diabetes Maternal Aunt    Asthma Paternal Aunt    Bladder Cancer Maternal Aunt    Ovarian cancer Neg Hx    Kidney cancer Neg Hx    Prostate cancer Neg Hx     SOCIAL HISTORY:   Social History   Tobacco Use   Smoking status: Former    Packs/day: 1.00    Years: 15.00    Total pack years: 15.00    Types: Cigarettes    Quit date: 11/25/1985    Years since quitting: 36.3   Smokeless tobacco: Never  Vaping Use   Vaping Use: Never used  Substance Use Topics   Alcohol use: No   Drug use: No    ALLERGIES:  is allergic to pollen extract, erythromycin, ether, gramineae pollens, montelukast sodium, penicillins, tamsulosin, and band-aid plus antibiotic [bacitracin-polymyxin b].  MEDICATIONS:  Current Outpatient Medications  Medication Sig Dispense Refill   albuterol (PROVENTIL) (2.5 MG/3ML) 0.083% nebulizer solution Inhale into the lungs.     ALBUTEROL IN Inhale into the lungs as needed.     aspirin 81 MG tablet Take 81 mg by mouth  daily.     Calcium 500 MG CHEW Chew by mouth once a week.     cetirizine (ZYRTEC) 10 MG tablet Take 10 mg by mouth daily.     Cholecalciferol (VITAMIN D-3) 1000 units CAPS Take by mouth daily.     cyclobenzaprine (FLEXERIL) 5 MG tablet 1/2-1 po bid prn     EPINEPHrine 0.3 mg/0.3 mL IJ SOAJ injection See admin instructions.     FLUoxetine (PROZAC) 40 MG capsule TAKE 1 CAPSULE BY MOUTH EACH MORNING 90 capsule 0   fluticasone (FLONASE) 50 MCG/ACT nasal spray Place 1 spray into both nostrils daily.     folic acid (FOLVITE) 1 MG tablet Take by mouth.     guaiFENesin (MUCINEX) 600 MG 12 hr tablet Take by mouth 2 (two) times daily.     HYDROcodone-acetaminophen (NORCO) 7.5-325 MG tablet Take by mouth.     hydrocortisone (ANUSOL-HC) 25 MG suppository Place 1 suppository (25 mg total) rectally daily as needed for hemorrhoids or anal itching. 15 suppository 0   hydrOXYzine (  ATARAX/VISTARIL) 25 MG tablet 1 tablet     levothyroxine (SYNTHROID) 150 MCG tablet TAKE 1 TABLET BY MOUTH DAILY 90 tablet 3   methotrexate (RHEUMATREX) 2.5 MG tablet SMARTSIG:5 Tablet(s) By Mouth Once a Week     Multiple Vitamins-Minerals (PRESERVISION AREDS 2+MULTI VIT PO) Take by mouth.     olmesartan (BENICAR) 20 MG tablet Take 20 mg by mouth as needed (IF BP IS ELEVATED).     Omega-3 Fatty Acids (FISH OIL) 1000 MG CAPS Take by mouth.     pantoprazole (PROTONIX) 40 MG tablet Take 1 tablet (40 mg total) by mouth daily. 90 tablet 3   predniSONE (DELTASONE) 2.5 MG tablet TAKE ONE TABLET EVERY DAY 30 tablet 6   PRESCRIPTION MEDICATION Allergy injections once weekly     vitamin C (ASCORBIC ACID) 500 MG tablet Take by mouth.     zolpidem (AMBIEN) 10 MG tablet TAKE ONE TABLET BY MOUTH AT BEDTIME 30 tablet 0   No current facility-administered medications for this visit.    PHYSICAL EXAMINATION:   BP 128/69 (BP Location: Left Arm, Patient Position: Sitting, Cuff Size: Normal)   Pulse 82   Temp (!) 97.4 F (36.3 C) (Tympanic)    Ht '5\' 2"'$  (1.575 m)   Wt 171 lb 6.4 oz (77.7 kg)   SpO2 100%   BMI 31.35 kg/m   Filed Weights   03/09/22 1048  Weight: 171 lb 6.4 oz (77.7 kg)    Physical Exam Vitals and nursing note reviewed.  HENT:     Head: Normocephalic and atraumatic.     Mouth/Throat:     Pharynx: Oropharynx is clear.  Eyes:     Extraocular Movements: Extraocular movements intact.     Pupils: Pupils are equal, round, and reactive to light.  Cardiovascular:     Rate and Rhythm: Normal rate and regular rhythm.  Pulmonary:     Comments: Decreased breath sounds bilaterally.  Abdominal:     Palpations: Abdomen is soft.  Musculoskeletal:        General: Normal range of motion.     Cervical back: Normal range of motion.  Skin:    General: Skin is warm.  Neurological:     General: No focal deficit present.     Mental Status: She is alert and oriented to person, place, and time.  Psychiatric:        Behavior: Behavior normal.        Judgment: Judgment normal.        LABORATORY DATA:  I have reviewed the data as listed    Component Value Date/Time   NA 133 (L) 08/27/2021 1223   NA 137 07/08/2014 1631   K 4.3 08/27/2021 1223   K 3.7 07/08/2014 1631   CL 101 08/27/2021 1223   CL 104 07/08/2014 1631   CO2 26 08/27/2021 1223   CO2 25 07/08/2014 1631   GLUCOSE 98 08/27/2021 1223   GLUCOSE 108 (H) 07/08/2014 1631   BUN 17 08/27/2021 1223   BUN 13 07/08/2014 1631   CREATININE 1.04 (H) 08/27/2021 1223   CREATININE 1.14 (H) 09/01/2020 1236   CALCIUM 9.3 08/27/2021 1223   CALCIUM 9.5 07/08/2014 1631   PROT 7.0 08/27/2021 1223   PROT 7.5 07/08/2014 1631   ALBUMIN 4.2 08/27/2021 1223   ALBUMIN 4.1 07/08/2014 1631   AST 17 08/27/2021 1223   AST 20 07/08/2014 1631   ALT 17 08/27/2021 1223   ALT 39 07/08/2014 1631   ALKPHOS 70 08/27/2021 1223  ALKPHOS 66 07/08/2014 1631   BILITOT 0.4 08/27/2021 1223   BILITOT 0.5 07/08/2014 1631   GFRNONAA 55 (L) 08/27/2021 1223   GFRNONAA 47 (L) 09/01/2020  1236   GFRAA 54 (L) 09/01/2020 1236    No results found for: "SPEP", "UPEP"  Lab Results  Component Value Date   WBC 7.1 03/07/2022   NEUTROABS 4.9 03/07/2022   HGB 12.0 03/07/2022   HCT 35.7 (L) 03/07/2022   MCV 89.9 03/07/2022   PLT 345 03/07/2022      Chemistry      Component Value Date/Time   NA 133 (L) 08/27/2021 1223   NA 137 07/08/2014 1631   K 4.3 08/27/2021 1223   K 3.7 07/08/2014 1631   CL 101 08/27/2021 1223   CL 104 07/08/2014 1631   CO2 26 08/27/2021 1223   CO2 25 07/08/2014 1631   BUN 17 08/27/2021 1223   BUN 13 07/08/2014 1631   CREATININE 1.04 (H) 08/27/2021 1223   CREATININE 1.14 (H) 09/01/2020 1236      Component Value Date/Time   CALCIUM 9.3 08/27/2021 1223   CALCIUM 9.5 07/08/2014 1631   ALKPHOS 70 08/27/2021 1223   ALKPHOS 66 07/08/2014 1631   AST 17 08/27/2021 1223   AST 20 07/08/2014 1631   ALT 17 08/27/2021 1223   ALT 39 07/08/2014 1631   BILITOT 0.4 08/27/2021 1223   BILITOT 0.5 07/08/2014 1631       RADIOGRAPHIC STUDIES: I have personally reviewed the radiological images as listed and agreed with the findings in the report. No results found.   ASSESSMENT & PLAN:  Abnormal tumor markers #Abnormal tumor-CEA [slightly improved at 4.8- AUG 2023;  [8.4 with Dr.Williams in Jan 2023]-FEB 2023-CT scan abdomen pelvis noncontrast-negative for any malignant process explaining patient's slightly elevated CEA.  Suspect inflammatory cause.  Do not recommend any further surveillance of tumor markers at this time.   # Difficulty swallowing/dysphonia-[January 23 -CT neck -glottic mass; however ENT evaluation negative for any malignancy].  CT chest-esophageal irregularity [facility] March 2022 EGD-negative for any malignancy; possible hiatal hernia]; February 2023 CT scan shows significantly large hiatal hernia.  Exacerbating her reflux- defer to PCP regarding surgical evaluation for hiatal hernia.  For now recommend Protonix twice a day.  #  Fibromyalgia/?  Psoriatic arthritis versus others- [Dr.Patel]-on prednisone [Dr.Chasnis]; on lyrica-STABLE.   # Hx of thyroid cancer [1990s]- [Dr.Masound]-on Synthroid- STABLE.   # CKD stage III [Dr.Singh]- STABLE.   #Since patient is clinically stable I think is reasonable for the patient to follow-up with Dr.williams PCP/can follow-up with [place] as needed.  Patient comfortable with the plan; to call us if any questions or concerns in the interim.   # DISPOSITION: # follow up in as needed-Dr.B  Cc; Dr.Jonathan Williams/PCP         No orders of the defined types were placed in this encounter.  All questions were answered. The patient knows to call the clinic with any problems, questions or concerns.      Cammie Sickle, MD 03/09/2022 1:01 PM

## 2022-03-09 NOTE — Assessment & Plan Note (Addendum)
#  Abnormal tumor-CEA [slightly improved at 4.8- AUG 2023;  [8.4 with Dr.Williams in Jan 2023]-FEB 2023-CT scan abdomen pelvis noncontrast-negative for any malignant process explaining patient's slightly elevated CEA.  Suspect inflammatory cause.  Do not recommend any further surveillance of tumor markers at this time.   # Difficulty swallowing/dysphonia-[January 23 -CT neck -glottic mass; however ENT evaluation negative for any malignancy].  CT chest-esophageal irregularity [facility] March 2022 EGD-negative for any malignancy; possible hiatal hernia]; February 2023 CT scan shows significantly large hiatal hernia.  Exacerbating her reflux- defer to PCP regarding surgical evaluation for hiatal hernia.  For now recommend Protonix twice a day.  # Fibromyalgia/?  Psoriatic arthritis versus others- [Dr.Patel]-on prednisone [Dr.Chasnis]; on lyrica-STABLE.   # Hx of thyroid cancer [1990s]- [Dr.Masound]-on Synthroid- STABLE.   # CKD stage III [Dr.Singh]- STABLE.   #Since patient is clinically stable I think is reasonable for the patient to follow-up with Dr.williams PCP/can follow-up with [place] as needed.  Patient comfortable with the plan; to call us if any questions or concerns in the interim.   # DISPOSITION: # follow up in as needed-Dr.B  Cc; Dr.Jonathan Williams/PCP

## 2022-04-07 ENCOUNTER — Emergency Department
Admission: EM | Admit: 2022-04-07 | Discharge: 2022-04-07 | Disposition: A | Discharge status: Home / Self Care | Payer: Medicare Other | Attending: Emergency Medicine | Admitting: Emergency Medicine

## 2022-04-07 ENCOUNTER — Other Ambulatory Visit: Payer: Self-pay

## 2022-04-07 ENCOUNTER — Emergency Department: Payer: Medicare Other

## 2022-04-07 DIAGNOSIS — M5416 Radiculopathy, lumbar region: Secondary | ICD-10-CM

## 2022-04-07 DIAGNOSIS — M79604 Pain in right leg: Secondary | ICD-10-CM | POA: Diagnosis present

## 2022-04-07 MED ORDER — DEXAMETHASONE SODIUM PHOSPHATE 10 MG/ML IJ SOLN
10.0000 mg | Freq: Once | INTRAMUSCULAR | Status: AC
  Administered 2022-04-07: 10 mg via INTRAMUSCULAR
  Filled 2022-04-07: qty 1

## 2022-04-07 MED ORDER — METHOCARBAMOL 500 MG PO TABS: 500.0000 mg | ORAL_TABLET | Freq: Four times a day (QID) | ORAL | 0 refills | Status: AC

## 2022-04-07 MED ORDER — PREDNISONE 50 MG PO TABS: 50.0000 mg | ORAL_TABLET | Freq: Every day | ORAL | 0 refills | Status: DC

## 2022-04-07 NOTE — ED Triage Notes (Signed)
Pt states that her R leg has been hurting x1 week- pt did see her rheumatologist yesterday but it has gotten worse since then- pt denies any injury to the area- pt states it started behind her knee and now its her whole leg that hurts- pt also having pain in her back, but states that is a chronic issue

## 2022-04-07 NOTE — ED Notes (Signed)
See triage note  Presents with pain to right leg  States pain starts from lower pain and moves into lower leg  States increased pain with min swelling to lower leg

## 2022-04-07 NOTE — ED Provider Notes (Signed)
[facility] Provider Note  Patient Contact: 3:43 PM (approximate)   History   Leg Pain   HPI  [name] is a 76 y.o. female who presents the emergency department complaining of right leg pain.  Patient has a history of degenerative disc disease, fibromyalgia, rheumatoid arthritis.  She states that she has had ongoing pain in her legs for a long time but has had worsening over the last couple of days.  She saw a rheumatologist who felt that this was coming out of her back.  When the pain worsened today she presents for evaluation.  She reports that her daughter had a history of blood clot the presented with pain behind her right knee and this is where most of her pain is increased.  There is no gross edema, erythema or warmth.  Patient is here to ensure that she does not have a blood clot.     Physical Exam   Triage Vital Signs: ED Triage Vitals [04/07/22 1427]  Enc Vitals Group     BP (!) 154/91     Pulse Rate 78     Resp 18     Temp 99.1 F (37.3 C)     Temp Source Oral     SpO2 99 %     Weight 175 lb (79.4 kg)     Height '5\' 1"'$  (1.549 m)     Head Circumference      Peak Flow      Pain Score 8     Pain Loc      Pain Edu?      Excl. in [place]?     Most recent vital signs: Vitals:   04/07/22 1427  BP: (!) 154/91  Pulse: 78  Resp: 18  Temp: 99.1 F (37.3 C)  SpO2: 99%     General: Alert and in no acute distress.  Cardiovascular:  Good peripheral perfusion Respiratory: Normal respiratory effort without tachypnea or retractions. Lungs CTAB.  Musculoskeletal: Full range of motion to all extremities.  Visualization of the right lower EXTR reveals no edema, erythema, ecchymosis, deformity or other visible signs of trauma.  Patient is tender along the superior gastrocnemius muscle region extending into the popliteal fossa.  No palpable abnormality.  No warmth to palpation.  No other significant tenderness to the right lower extremity.   Dorsalis pedis pulses sensation intact distally. Neurologic:  No gross focal neurologic deficits are appreciated.  Skin:   No rash noted Other:   ED Results / Procedures / Treatments   Labs (all labs ordered are listed, but only abnormal results are displayed) Labs Reviewed - No data to display   EKG     RADIOLOGY  I personally viewed, evaluated, and interpreted these images as part of my medical decision making, as well as reviewing the written report by the radiologist.  ED Provider Interpretation: No evidence of DVT on ultrasound.  US Venous Img Lower Unilateral Right  Result Date: 04/07/2022 CLINICAL DATA:  Calf pain and edema EXAM: RIGHT LOWER EXTREMITY VENOUS DOPPLER ULTRASOUND TECHNIQUE: Gray-scale sonography with compression, as well as color and duplex ultrasound, were performed to evaluate the deep venous system(s) from the level of the common femoral vein through the popliteal and proximal calf veins. COMPARISON:  None Available. FINDINGS: VENOUS Normal compressibility of the common femoral, superficial femoral, and popliteal veins, as well as the visualized calf veins. Visualized portions of profunda femoral vein and great saphenous vein unremarkable. No filling defects to suggest  DVT on grayscale or color Doppler imaging. Doppler waveforms show normal direction of venous flow, normal respiratory plasticity and response to augmentation. Limited views of the contralateral common femoral vein are unremarkable. OTHER None. Limitations: none IMPRESSION: Negative. Electronically Signed   By: Ronney Asters M.D.   On: 04/07/2022 17:39    PROCEDURES:  Critical Care performed: No  Procedures   MEDICATIONS ORDERED IN ED: Medications  dexamethasone (DECADRON) injection 10 mg (has no administration in time range)     IMPRESSION / MDM / ASSESSMENT AND PLAN / ED COURSE  I reviewed the triage vital signs and the nursing notes.                              Differential  diagnosis includes, but is not limited to, lumbar radiculopathy, rheumatoid arthritis pain, fibromyalgia pain, DVT  Patient's presentation is most consistent with acute presentation with potential threat to life or bodily function.   Patient's diagnosis is consistent with leg pain, lumbar radiculopathy.  Patient presented to the emergency department with increased right leg pain.  Longstanding history of chronic pain, has degenerative disc disease, rheumatoid arthritis, fibromyalgia.  Patient was primarily concerned that she could have a DVT given the location of the pain behind the knee.  Ultrasound reveals no evidence of clot.  Based off exam and reported pain I suspect that this is coming out of the patient's back.  We will treat with steroid and muscle relaxer.  Follow-up with primary care as needed.. Patient is given ED precautions to return to the ED for any worsening or new symptoms.        FINAL CLINICAL IMPRESSION(S) / ED DIAGNOSES   Final diagnoses:  Right leg pain  Lumbar radiculopathy     Rx / DC Orders   ED Discharge Orders          Ordered    predniSONE (DELTASONE) 50 MG tablet  Daily with breakfast        04/07/22 1758    methocarbamol (ROBAXIN) 500 MG tablet  4 times daily        04/07/22 1758             Note:  This document was prepared using Dragon voice recognition software and may include unintentional dictation errors.   Brynda Peon 04/07/22 1758    Vanessa Moody, MD 04/08/22 Lurena Nida

## 2022-05-23 ENCOUNTER — Encounter (INDEPENDENT_AMBULATORY_CARE_PROVIDER_SITE_OTHER): Payer: Self-pay

## 2022-09-06 ENCOUNTER — Other Ambulatory Visit: Payer: Self-pay | Admitting: Physician Assistant

## 2022-10-04 ENCOUNTER — Encounter: Payer: Medicare Other | Admitting: Obstetrics and Gynecology

## 2022-10-04 DIAGNOSIS — Z01419 Encounter for gynecological examination (general) (routine) without abnormal findings: Secondary | ICD-10-CM

## 2022-11-10 ENCOUNTER — Encounter: Payer: Self-pay | Admitting: Obstetrics and Gynecology

## 2022-11-10 ENCOUNTER — Ambulatory Visit: Payer: Medicare Other | Admitting: Obstetrics and Gynecology

## 2022-11-10 DIAGNOSIS — Z01419 Encounter for gynecological examination (general) (routine) without abnormal findings: Secondary | ICD-10-CM

## 2023-06-06 ENCOUNTER — Telehealth: Payer: Self-pay | Admitting: Gastroenterology

## 2023-06-06 NOTE — Telephone Encounter (Signed)
PT requesting call back to schedule a follow -up endo from 2022

## 2023-06-07 NOTE — Telephone Encounter (Signed)
Pt reports a recent increase in GERD and nausea Sx and gurgling noises in stomach.... Pt request repeat EGD, please advise

## 2023-06-15 ENCOUNTER — Other Ambulatory Visit: Payer: Self-pay

## 2023-06-15 DIAGNOSIS — R1319 Other dysphagia: Secondary | ICD-10-CM

## 2023-06-15 NOTE — Telephone Encounter (Signed)
EGD scheduled and PPW released to Mychart and mailed to pt

## 2023-07-06 ENCOUNTER — Ambulatory Visit: Payer: Medicare Other | Admitting: Certified Registered"

## 2023-07-06 ENCOUNTER — Ambulatory Visit
Admission: RE | Admit: 2023-07-06 | Discharge: 2023-07-06 | Disposition: A | Discharge status: Home / Self Care | Payer: Medicare Other | Attending: Gastroenterology | Admitting: Gastroenterology

## 2023-07-06 ENCOUNTER — Encounter: Payer: Self-pay | Admitting: Gastroenterology

## 2023-07-06 ENCOUNTER — Other Ambulatory Visit: Payer: Self-pay

## 2023-07-06 ENCOUNTER — Encounter
Admission: RE | Disposition: A | Discharge status: Home / Self Care | Payer: Self-pay | Source: Home / Self Care | Attending: Gastroenterology

## 2023-07-06 DIAGNOSIS — R1319 Other dysphagia: Secondary | ICD-10-CM

## 2023-07-06 DIAGNOSIS — K449 Diaphragmatic hernia without obstruction or gangrene: Secondary | ICD-10-CM | POA: Insufficient documentation

## 2023-07-06 DIAGNOSIS — K31A0 Gastric intestinal metaplasia, unspecified: Secondary | ICD-10-CM

## 2023-07-06 DIAGNOSIS — I10 Essential (primary) hypertension: Secondary | ICD-10-CM | POA: Diagnosis not present

## 2023-07-06 DIAGNOSIS — Z09 Encounter for follow-up examination after completed treatment for conditions other than malignant neoplasm: Secondary | ICD-10-CM | POA: Diagnosis present

## 2023-07-06 DIAGNOSIS — Z8719 Personal history of other diseases of the digestive system: Secondary | ICD-10-CM | POA: Diagnosis not present

## 2023-07-06 DIAGNOSIS — R1310 Dysphagia, unspecified: Secondary | ICD-10-CM | POA: Diagnosis not present

## 2023-07-06 DIAGNOSIS — Z87891 Personal history of nicotine dependence: Secondary | ICD-10-CM | POA: Insufficient documentation

## 2023-07-06 DIAGNOSIS — K219 Gastro-esophageal reflux disease without esophagitis: Secondary | ICD-10-CM | POA: Insufficient documentation

## 2023-07-06 HISTORY — PX: ESOPHAGOGASTRODUODENOSCOPY (EGD) WITH PROPOFOL: SHX5813

## 2023-07-06 HISTORY — PX: BIOPSY: SHX5522

## 2023-07-06 SURGERY — ESOPHAGOGASTRODUODENOSCOPY (EGD) WITH PROPOFOL
Anesthesia: General

## 2023-07-06 MED ORDER — LIDOCAINE HCL (PF) 2 % IJ SOLN
INTRAMUSCULAR | Status: AC
  Filled 2023-07-06: qty 5

## 2023-07-06 MED ORDER — PROPOFOL 10 MG/ML IV BOLUS
INTRAVENOUS | Status: AC
  Filled 2023-07-06: qty 40

## 2023-07-06 MED ORDER — LIDOCAINE HCL (PF) 2 % IJ SOLN
INTRAMUSCULAR | Status: DC | PRN
  Administered 2023-07-06: 100 mg via INTRADERMAL

## 2023-07-06 MED ORDER — PROPOFOL 10 MG/ML IV BOLUS
INTRAVENOUS | Status: AC
  Filled 2023-07-06: qty 20

## 2023-07-06 MED ORDER — SODIUM CHLORIDE 0.9 % IV SOLN: INTRAVENOUS | Status: DC

## 2023-07-06 MED ORDER — PROPOFOL 10 MG/ML IV BOLUS
INTRAVENOUS | Status: DC | PRN
  Administered 2023-07-06: 20 mg via INTRAVENOUS
  Administered 2023-07-06: 100 mg via INTRAVENOUS
  Administered 2023-07-06: 20 mg via INTRAVENOUS

## 2023-07-06 NOTE — H&P (Signed)
Midge Minium, MD [facility] [address], [address] [place], [place] [address] [phone] Fax : [phone]  Primary Care Physician:  Marvia Pickles, MD Primary Gastroenterologist:  Dr. Servando Snare  Pre-Procedure History & Physical: HPI:  [name] is a 77 y.o. female is here for an endoscopy.   Past Medical History:  Diagnosis Date   Arthritis    Asthma    WELL CONTROLLED   Bulging lumbar disc    Cancer (HCC) 1999   thyroid with recurrent 2006   Complication of anesthesia    HEART STOPPED DURING TONSILLECTOMY (AGE 33) DUE TO ALLERGY TO ETHER   DDD (degenerative disc disease), lumbar    DDD (degenerative disc disease), thoracolumbar    Diverticulosis    Family history of adverse reaction to anesthesia    PTS FATHER-UNSURE WHAT HAPPENED   Fibromyalgia    Fibromyalgia    GERD (gastroesophageal reflux disease)    Heart murmur    Hepatitis    AGE 33 "VIRAL"   Hypertension    OFF MEDS CURRENTLY PER PCP (MASOUD)   Hypothyroidism    Interstitial cystitis    Kidney disease    PONV (postoperative nausea and vomiting)    Thyroid disease     Past Surgical History:  Procedure Laterality Date   BLADDER SURGERY  1984   BREAST SURGERY  [id number]   mass removed   BREAST SURGERY  April 2017   Dr Gwendolyn Fill [facility]   CATARACT EXTRACTION  2011   CHOLECYSTECTOMY N/A 11/30/2015   Procedure: LAPAROSCOPIC CHOLECYSTECTOMY WITH INTRAOPERATIVE CHOLANGIOGRAM;  Surgeon: Kieth Brightly, MD;  Location: [facility] ORS;  Service: General;  Laterality: N/A;   COLONOSCOPY  2005, 2015   COLONOSCOPY WITH PROPOFOL N/A 10/06/2020   Procedure: COLONOSCOPY WITH PROPOFOL;  Surgeon: Midge Minium, MD;  Location: [facility] ENDOSCOPY;  Service: Endoscopy;  Laterality: N/A;   DILATION AND CURETTAGE OF UTERUS  1975   ESOPHAGOGASTRODUODENOSCOPY (EGD) WITH PROPOFOL N/A 10/06/2020   Procedure: ESOPHAGOGASTRODUODENOSCOPY (EGD) WITH PROPOFOL;  Surgeon: Midge Minium, MD;  Location: [facility] ENDOSCOPY;  Service:  Endoscopy;  Laterality: N/A;   EXCISIONAL HEMORRHOIDECTOMY  1975   EYE SURGERY Left 2011   HEMORRHOID SURGERY  1975   HERNIA REPAIR  2015   THYROIDECTOMY  1999   TONSILLECTOMY  1952   AGE 33   TONSILLECTOMY      Prior to Admission medications   Medication Sig Start Date End Date Taking? Authorizing Provider  albuterol (PROVENTIL) (2.5 MG/3ML) 0.083% nebulizer solution Inhale into the lungs.    [provider]  ALBUTEROL IN Inhale into the lungs as needed.    [provider]  aspirin 81 MG tablet Take 81 mg by mouth daily.    [provider]  Calcium 500 MG CHEW Chew by mouth once a week. Patient not taking: Reported on 06/29/2023    [provider]  certolizumab pegol (CIMZIA) 2 X 200 MG KIT Inject into the skin.    [provider]  cetirizine (ZYRTEC) 10 MG tablet Take 10 mg by mouth daily.    [provider]  Cholecalciferol (VITAMIN D-3) 1000 units CAPS Take by mouth daily.    [provider]  cyclobenzaprine (FLEXERIL) 5 MG tablet 1/2-1 po bid prn 09/29/20   [provider]  EPINEPHrine 0.3 mg/0.3 mL IJ SOAJ injection See admin instructions. 02/04/19   [provider]  FLUoxetine (PROZAC) 40 MG capsule TAKE 1 CAPSULE BY MOUTH EACH MORNING 08/17/21   Corky Downs, MD  fluticasone (  FLONASE) 50 MCG/ACT nasal spray Place 1 spray into both nostrils daily.    [provider]  guaiFENesin (MUCINEX) 600 MG 12 hr tablet Take by mouth 2 (two) times daily.    [provider]  HYDROcodone-acetaminophen (NORCO) 7.5-325 MG tablet Take by mouth. 04/21/17   [provider]  hydrocortisone (ANUSOL-HC) 25 MG suppository Place 1 suppository (25 mg total) rectally daily as needed for hemorrhoids or anal itching. 09/29/21   Linzie Collin, MD  hydrOXYzine (ATARAX/VISTARIL) 25 MG tablet 1 tablet    [provider]  levothyroxine (SYNTHROID) 150 MCG tablet TAKE 1 TABLET BY MOUTH DAILY 09/23/21    Corky Downs, MD  methocarbamol (ROBAXIN) 500 MG tablet Take 1 tablet (500 mg total) by mouth 4 (four) times daily. 04/07/22   Cuthriell, Delorise Royals, PA-C  methotrexate (RHEUMATREX) 2.5 MG tablet SMARTSIG:5 Tablet(s) By Mouth Once a Week 02/22/22   [provider]  Multiple Vitamins-Minerals (PRESERVISION AREDS 2+MULTI VIT PO) Take by mouth.    [provider]  olmesartan (BENICAR) 20 MG tablet Take 20 mg by mouth as needed (IF BP IS ELEVATED).    [provider]  Omega-3 Fatty Acids (FISH OIL) 1000 MG CAPS Take by mouth.    [provider]  pantoprazole (PROTONIX) 40 MG tablet Take 1 tablet (40 mg total) by mouth daily. 12/06/21   Joni Reining, PA-C  predniSONE (DELTASONE) 2.5 MG tablet TAKE ONE TABLET EVERY DAY 07/13/21   Corky Downs, MD  predniSONE (DELTASONE) 50 MG tablet Take 1 tablet (50 mg total) by mouth daily with breakfast. 04/07/22   Cuthriell, Delorise Royals, PA-C  PRESCRIPTION MEDICATION Allergy injections once weekly    [provider]  vitamin C (ASCORBIC ACID) 500 MG tablet Take by mouth.    [provider]  zolpidem (AMBIEN) 10 MG tablet TAKE ONE TABLET BY MOUTH AT BEDTIME 08/17/21   Corky Downs, MD    Allergies as of 06/15/2023 - Review Complete 11/10/2022  Allergen Reaction Noted   Pollen extract Other (See Comments) 11/09/2012   Erythromycin Other (See Comments) 08/06/2020   Ether  11/26/2015   Gramineae pollens Other (See Comments) 11/09/2012   Montelukast sodium Other (See Comments) 08/06/2020   Penicillins Hives 04/09/2014   Tamsulosin  05/01/2017   Band-aid plus antibiotic [bacitracin-polymyxin b] Rash 11/27/2015    Family History  Problem Relation Age of Onset   Heart disease Mother    Cancer Father        colon   Heart disease Brother    Diabetes Maternal Aunt    Asthma Paternal Aunt    Bladder Cancer Maternal Aunt    Ovarian cancer Neg Hx    Kidney cancer Neg Hx    Prostate cancer Neg Hx     Social  History   Socioeconomic History   Marital status: Married    Spouse name: Not on file   Number of children: Not on file   Years of education: Not on file   Highest education level: Not on file  Occupational History   Not on file  Tobacco Use   Smoking status: Former    Current packs/day: 0.00    Average packs/day: 1 pack/day for 15.0 years (15.0 ttl pk-yrs)    Types: Cigarettes    Start date: 11/26/1970    Quit date: 11/25/1985    Years since quitting: 37.6   Smokeless tobacco: Never  Vaping Use   Vaping status: Never Used  Substance and Sexual Activity  Alcohol use: No   Drug use: No   Sexual activity: Not Currently    Birth control/protection: Post-menopausal  Other Topics Concern   Not on file  Social History Narrative   Lives in [place] with husband; daughter- 20 mins; quit smoking > 1990s; no alcohol. 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 ]   Social Drivers of Health   Financial Resource Strain: Low Risk  (05/31/2023)   Received from [facility]   Overall Financial Resource Strain (CARDIA)    Difficulty of Paying Living Expenses: Not hard at all  Food Insecurity: No Food Insecurity (05/31/2023)   Received from [facility]   Hunger Vital Sign    Worried About Running Out of Food in the Last Year: Never true    Ran Out of Food in the Last Year: Never true  Transportation Needs: No Transportation Needs (05/31/2023)   Received from [facility] - Transportation    In the past 12 months, has lack of transportation kept you from medical appointments or from getting medications?: No    Lack of Transportation (Non-Medical): No  Physical Activity: Not on file  Stress: Not on file  Social Connections: Not on file  Intimate Partner Violence: Not on file    Review of Systems: See HPI, otherwise negative ROS  Physical Exam: There were no vitals taken for this visit. General:   Alert,  pleasant and  cooperative in NAD Head:  Normocephalic and atraumatic. Neck:  Supple; no masses or thyromegaly. Lungs:  Clear throughout to auscultation.    Heart:  Regular rate and rhythm. Abdomen:  Soft, nontender and nondistended. Normal bowel sounds, without guarding, and without rebound.   Neurologic:  Alert and  oriented x4;  grossly normal neurologically.  Impression/Plan: [name] is here for an endoscopy to be performed for gastric intestinal metaplasia  Risks, benefits, limitations, and alternatives regarding  endoscopy have been reviewed with the patient.  Questions have been answered.  All parties agreeable.   Midge Minium, MD  07/06/2023, 10:58 AM

## 2023-07-06 NOTE — Op Note (Signed)
[facility] Gastroenterology Patient Name: [name] Procedure Date: 07/06/2023 11:11 AM MRN: [id number] Account #: [id number] Date of Birth: [birth year] Admit Type: Outpatient Age: 77 Room: [facility] ENDO ROOM 4 Gender: Female Note Status: Finalized Instrument Name: Upper Endoscope [id number] Procedure:             Upper GI endoscopy Indications:           Follow-up of gastric intestinal metaplasia Providers:             Midge Minium MD, MD Referring MD:          Midge Minium MD, MD (Referring MD), Cecille Amsterdam.                         Mayford Knife, MD (Referring MD) Medicines:             Propofol per Anesthesia Procedure:             Pre-Anesthesia Assessment:                        - Prior to the procedure, a History and Physical was                         performed, and patient medications and allergies were                         reviewed. [name] tolerance of previous                         anesthesia was also reviewed. The risks and benefits                         of the procedure and the sedation options and risks                         were discussed with the patient. All questions were                         answered, and informed consent was obtained. Prior                         Anticoagulants: The patient has taken no anticoagulant                         or antiplatelet agents. ASA Grade Assessment: II - A                         patient with mild systemic disease. After reviewing                         the risks and benefits, the patient was deemed in                         satisfactory condition to undergo the procedure.                        After obtaining informed consent, the endoscope was  passed under direct vision. Throughout the procedure,                         the patient's blood pressure, pulse, and oxygen                         saturations were monitored continuously. The                          Endosonoscope was introduced through the mouth, and                         advanced to the second part of duodenum. The upper GI                         endoscopy was accomplished without difficulty. The                         patient tolerated the procedure well. Findings:      An 8 cm hiatal hernia was present.      The entire examined stomach was normal. Biopsies were taken with a cold       forceps for histology.      The examined duodenum was normal. Impression:            - 8 cm hiatal hernia.                        - Normal stomach. Biopsied.                        - Normal examined duodenum. Recommendation:        - Discharge patient to home.                        - Resume previous diet.                        - Continue present medications.                        - Await pathology results. Procedure Code(s):     --- Professional ---                        [phone], Esophagogastroduodenoscopy, flexible,                         transoral; with biopsy, single or multiple Diagnosis Code(s):     --- Professional ---                        K31.A0, Gastric intestinal metaplasia, unspecified CPT copyright 2022 American Medical Association. All rights reserved. The codes documented in this report are preliminary and upon coder review may  be revised to meet current compliance requirements. Midge Minium MD, MD 07/06/2023 11:21:42 AM This report has been signed electronically. Number of Addenda: 0 Note Initiated On: 07/06/2023 11:11 AM      [facility]

## 2023-07-06 NOTE — Anesthesia Preprocedure Evaluation (Signed)
Anesthesia Evaluation  Patient identified by MRN, date of birth, ID band Patient awake    Reviewed: Allergy & Precautions, H&P , NPO status , Patient's Chart, lab work & pertinent test results  History of Anesthesia Complications (+) PONV, Family history of anesthesia reaction and history of anesthetic complications  Airway Mallampati: III  TM Distance: <3 FB Neck ROM: full    Dental  (+) Chipped, Dental Advidsory Given   Pulmonary neg shortness of breath, asthma , neg sleep apnea, neg COPD, neg recent URI, former smoker   Pulmonary exam normal        Cardiovascular Exercise Tolerance: Good hypertension, (-) angina (-) Past MI and (-) Cardiac Stents Normal cardiovascular exam(-) dysrhythmias + Valvular Problems/Murmurs      Neuro/Psych neg Seizures  Neuromuscular disease  negative psych ROS   GI/Hepatic ,GERD  Medicated and Controlled,,(+) Hepatitis - (Viral as a child)  Endo/Other  neg diabetesHypothyroidism  Class 3 obesity  Renal/GU CRFRenal disease  negative genitourinary   Musculoskeletal  (+) Arthritis ,  Fibromyalgia -  Abdominal   Peds  Hematology negative hematology ROS (+)   Anesthesia Other Findings Past Medical History: No date: Asthma     Comment:  WELL CONTROLLED No date: Bulging lumbar disc 1999: Cancer (HCC)     Comment:  thyroid with recurrent 2006 No date: Complication of anesthesia     Comment:  HEART STOPPED DURING TONSILLECTOMY (AGE 79) DUE TO               ALLERGY TO ETHER No date: DDD (degenerative disc disease), lumbar No date: DDD (degenerative disc disease), thoracolumbar No date: Diverticulosis No date: Family history of adverse reaction to anesthesia     Comment:  PTS FATHER-UNSURE WHAT HAPPENED No date: Fibromyalgia No date: GERD (gastroesophageal reflux disease) No date: Heart murmur No date: Hepatitis     Comment:  AGE 62 "VIRAL" No date: Hypertension     Comment:  OFF MEDS  CURRENTLY PER PCP (MASOUD) No date: Hypothyroidism No date: Interstitial cystitis No date: Kidney disease No date: PONV (postoperative nausea and vomiting) No date: Thyroid disease  Past Surgical History: 1984: BLADDER SURGERY [id number]: BREAST SURGERY     Comment:  mass removed April 2017: BREAST SURGERY     Comment:  Dr Gwendolyn Fill [facility] 2011: CATARACT EXTRACTION 11/30/2015: CHOLECYSTECTOMY; N/A     Comment:  Procedure: LAPAROSCOPIC CHOLECYSTECTOMY WITH               INTRAOPERATIVE CHOLANGIOGRAM;  Surgeon: Kieth Brightly, MD;  Location: [facility] ORS;  Service: General;                Laterality: N/A; 2005, 2015: COLONOSCOPY 1975: DILATION AND CURETTAGE OF UTERUS 1975: EXCISIONAL HEMORRHOIDECTOMY 2011: EYE SURGERY; Left 1975: HEMORRHOID SURGERY 2015: HERNIA REPAIR 1999: THYROIDECTOMY 1952: TONSILLECTOMY     Comment:  AGE 79 No date: TONSILLECTOMY  BMI    Body Mass Index: 31.93 kg/m      Reproductive/Obstetrics negative OB ROS                             Anesthesia Physical Anesthesia Plan  ASA: 3  Anesthesia Plan: General   Post-op Pain Management:    Induction: Intravenous  PONV Risk Score and Plan: 4 or greater and Propofol infusion and TIVA  Airway Management Planned: Natural Airway and Nasal Cannula  Additional  Equipment:   Intra-op Plan:   Post-operative Plan:   Informed Consent: I have reviewed the patients History and Physical, chart, labs and discussed the procedure including the risks, benefits and alternatives for the proposed anesthesia with the patient or authorized representative who has indicated his/her understanding and acceptance.     Dental Advisory Given  Plan Discussed with: Anesthesiologist, CRNA and Surgeon  Anesthesia Plan Comments: (Patient consented for risks of anesthesia including but not limited to:  - adverse reactions to medications - risk of airway placement if required - damage to  eyes, teeth, lips or other oral mucosa - nerve damage due to positioning  - sore throat or hoarseness - Damage to heart, brain, nerves, lungs, other parts of body or loss of life  Patient voiced understanding.)        Anesthesia Quick Evaluation

## 2023-07-06 NOTE — Transfer of Care (Signed)
Immediate Anesthesia Transfer of Care Note  Patient: [name]  Procedure(s) Performed: ESOPHAGOGASTRODUODENOSCOPY (EGD) WITH PROPOFOL BIOPSY  Patient Location: Endoscopy Unit  Anesthesia Type:General  Level of Consciousness: awake, alert , and oriented  Airway & Oxygen Therapy: Patient Spontanous Breathing  Post-op Assessment: Report given to RN and Post -op Vital signs reviewed and stable  Post vital signs: Reviewed and stable  Last Vitals:  Vitals Value Taken Time  BP 136/72 07/06/23 1126  Temp 35.8 C 07/06/23 1125  Pulse 73 07/06/23 1126  Resp 19 07/06/23 1126  SpO2 100 % 07/06/23 1126  Vitals shown include unfiled device data.  Last Pain:  Vitals:   07/06/23 1125  TempSrc: Temporal  PainSc: 0-No pain         Complications: No notable events documented.

## 2023-07-07 ENCOUNTER — Encounter: Payer: Self-pay | Admitting: Gastroenterology

## 2023-07-12 LAB — SURGICAL PATHOLOGY

## 2023-07-13 NOTE — Anesthesia Postprocedure Evaluation (Signed)
Anesthesia Post Note  Patient: [name]  Procedure(s) Performed: ESOPHAGOGASTRODUODENOSCOPY (EGD) WITH PROPOFOL BIOPSY  Patient location during evaluation: Endoscopy Anesthesia Type: General Level of consciousness: awake and alert Pain management: pain level controlled Vital Signs Assessment: post-procedure vital signs reviewed and stable Respiratory status: spontaneous breathing, nonlabored ventilation, respiratory function stable and patient connected to nasal cannula oxygen Cardiovascular status: blood pressure returned to baseline and stable Postop Assessment: no apparent nausea or vomiting Anesthetic complications: no   No notable events documented.   Last Vitals:  Vitals:   07/06/23 1135 07/06/23 1147  BP: (!) 145/78 (!) 162/78  Pulse:  (!) 55  Resp:  15  Temp:    SpO2:  100%    Last Pain:  Vitals:   07/07/23 0756  TempSrc:   PainSc: 0-No pain                 Lenard Simmer

## 2023-07-18 ENCOUNTER — Encounter: Payer: Self-pay | Admitting: Gastroenterology

## 2024-02-20 ENCOUNTER — Other Ambulatory Visit
Admission: RE | Admit: 2024-02-20 | Discharge: 2024-02-20 | Disposition: A | Discharge status: Home / Self Care | Source: Ambulatory Visit | Attending: Rheumatology | Admitting: Rheumatology

## 2024-02-20 DIAGNOSIS — R0602 Shortness of breath: Secondary | ICD-10-CM | POA: Insufficient documentation

## 2024-02-20 LAB — BRAIN NATRIURETIC PEPTIDE: B Natriuretic Peptide: 118.4 pg/mL — ABNORMAL HIGH (ref 0.0–100.0)

## 2024-02-20 LAB — D-DIMER, QUANTITATIVE: D-Dimer, Quant: <0.27 ug{FEU}/mL (ref 0.00–0.50)

## 2024-07-21 ENCOUNTER — Other Ambulatory Visit: Payer: Self-pay

## 2024-07-21 ENCOUNTER — Emergency Department
Admission: EM | Admit: 2024-07-21 | Discharge: 2024-07-21 | Disposition: A | Discharge status: Home / Self Care | Attending: Emergency Medicine | Admitting: Emergency Medicine

## 2024-07-21 ENCOUNTER — Emergency Department

## 2024-07-21 DIAGNOSIS — R059 Cough, unspecified: Secondary | ICD-10-CM | POA: Diagnosis present

## 2024-07-21 DIAGNOSIS — B349 Viral infection, unspecified: Secondary | ICD-10-CM | POA: Diagnosis not present

## 2024-07-21 DIAGNOSIS — J101 Influenza due to other identified influenza virus with other respiratory manifestations: Secondary | ICD-10-CM | POA: Diagnosis not present

## 2024-07-21 DIAGNOSIS — J449 Chronic obstructive pulmonary disease, unspecified: Secondary | ICD-10-CM | POA: Diagnosis not present

## 2024-07-21 LAB — RESP PANEL BY RT-PCR (RSV, FLU A&B, COVID)  RVPGX2
Influenza A by PCR: POSITIVE — AB
Influenza B by PCR: NEGATIVE
Resp Syncytial Virus by PCR: NEGATIVE
SARS Coronavirus 2 by RT PCR: NEGATIVE

## 2024-07-21 LAB — GROUP A STREP BY PCR: Group A Strep by PCR: NOT DETECTED

## 2024-07-21 MED ORDER — METHYLPREDNISOLONE 4 MG PO TBPK: ORAL_TABLET | ORAL | 0 refills | Status: AC

## 2024-07-21 MED ORDER — OSELTAMIVIR PHOSPHATE 75 MG PO CAPS: 75.0000 mg | ORAL_CAPSULE | Freq: Two times a day (BID) | ORAL | 0 refills | Status: AC

## 2024-07-21 NOTE — ED Provider Notes (Signed)
 "  [facility] Provider Note   Event Date/Time   First MD Initiated Contact with Patient 07/21/24 1158     (approximate) History  Generalized Body Aches (/)  HPI [name] is a 78 y.o. female with with a past medical history of COPD who presents complaining of generalized body aches, sore throat, cough, congestion, and headache after her husband was diagnosed with the flu yesterday.  Patient states she is having some shortness of breath as well.  Patient has been using her at home nebulizer treatments that have helped her shortness of breath. ROS: Patient currently denies any vision changes, tinnitus, difficulty speaking, facial droop, sore throat, chest pain, abdominal pain, nausea/vomiting/diarrhea, dysuria, or weakness/numbness/paresthesias in any extremity   Physical Exam  Triage Vital Signs: ED Triage Vitals  Encounter Vitals Group     BP 07/21/24 1051 127/63     Girls Systolic BP Percentile --      Girls Diastolic BP Percentile --      Boys Systolic BP Percentile --      Boys Diastolic BP Percentile --      Pulse Rate 07/21/24 1051 86     Resp 07/21/24 1051 18     Temp 07/21/24 1051 98.1 F (36.7 C)     Temp Source 07/21/24 1051 Oral     SpO2 07/21/24 1051 95 %     Weight 07/21/24 1052 163 lb (73.9 kg)     Height 07/21/24 1052 5' (1.524 m)     Head Circumference --      Peak Flow --      Pain Score 07/21/24 1108 5     Pain Loc --      Pain Education --      Exclude from Growth Chart --    Most recent vital signs: Vitals:   07/21/24 1051  BP: 127/63  Pulse: 86  Resp: 18  Temp: 98.1 F (36.7 C)  SpO2: 95%   General: Awake, oriented x4. CV:  Good peripheral perfusion. Resp:  Normal effort.  Mild and expiratory wheezing bilaterally Abd:  No distention. Other:  Elderly obese Caucasian female resting comfortably in no acute distress ED Results / Procedures / Treatments  Labs (all labs ordered are listed, but only abnormal results  are displayed) Labs Reviewed  RESP PANEL BY RT-PCR (RSV, FLU A&B, COVID)  [id number] - Abnormal; Notable for the following components:      Result Value   Influenza A by PCR POSITIVE (*)    All other components within normal limits  GROUP A STREP BY PCR   RADIOLOGY ED MD interpretation: 2 view chest x-ray interpreted by me shows no evidence of acute abnormalities including no pneumonia, pneumothorax, or widened mediastinum.  Patient has incidentally found small to moderate hiatal hernia - All radiology independently interpreted and agree with radiology assessment Official radiology report(s): DG Chest 2 View Result Date: 07/21/2024 CLINICAL DATA:  Cough.  Influenza. EXAM: CHEST - 2 VIEW COMPARISON:  03/06/2017 FINDINGS: The heart size and mediastinal contours are within normal limits. Both lungs are clear. Small to moderate hiatal hernia noted. IMPRESSION: No active cardiopulmonary disease. Small to moderate hiatal hernia. Electronically Signed   By: Norleen DELENA Kil M.D.   On: 07/21/2024 12:40   PROCEDURES: Critical Care performed: No Procedures MEDICATIONS ORDERED IN ED: Medications - No data to display IMPRESSION / MDM / ASSESSMENT AND PLAN / ED COURSE  I reviewed the triage vital signs and the nursing notes.  The patient is on the cardiac monitor to evaluate for evidence of arrhythmia and/or significant heart rate changes. Patient's presentation is most consistent with acute presentation with potential threat to life or bodily function. Patient is an 78 year old female with the above-stated past medical history presents complaining of signs and symptoms of viral infection including cough, congestion, sore throat, body aches, and headache after husband was recently diagnosed with influenza.  Patient has tested positive for influenza A at this time with clear chest x-ray.  Patient has mild end expiratory wheezing bilaterally concerning for mild COPD exacerbation at  this time.  Patient will require steroid burst.  Patient was offered and agreed to Tamiflu  as well.  Patient was given the risks and benefits of this medication and agreed to a prescription.  Patient agrees with plan for discharge at this time with outpatient PCP follow-up as needed.  Patient was given strict return precautions and all questions answered prior to discharge.  Dispo: Discharge home with PCP follow-up   FINAL CLINICAL IMPRESSION(S) / ED DIAGNOSES   Final diagnoses:  Viral syndrome  Influenza A virus present   Rx / DC Orders   ED Discharge Orders          Ordered    oseltamivir  (TAMIFLU ) 75 MG capsule  2 times daily        07/21/24 1244    methylPREDNISolone  (MEDROL  DOSEPAK) 4 MG TBPK tablet        07/21/24 1244           Note:  This document was prepared using Dragon voice recognition software and may include unintentional dictation errors.   Darwyn Ponzo K, MD 07/21/24 1256  "

## 2024-07-21 NOTE — Discharge Instructions (Addendum)
 Please hold your daily prednisone  2.5 mg while on this steroid taper.  You may return to the 2.5 daily dosing after this taper has finished.

## 2024-07-21 NOTE — ED Triage Notes (Signed)
 Pt to ED via POV from home. Pt reports was here yesterday with husband and he was diagnosed with Flu A. Pt reports she is now having HA, sore throat, cough, congestion, body aches.
